# Patient Record
Sex: Male | Born: 1970 | Race: White | Hispanic: No | Smoking: Former smoker
Health system: Southern US, Community
[De-identification: ages and names within clinical notes are randomized; demographics above are authoritative.]

## PROBLEM LIST (undated history)

## (undated) DIAGNOSIS — F4310 Post-traumatic stress disorder, unspecified: Secondary | ICD-10-CM

## (undated) DIAGNOSIS — F329 Major depressive disorder, single episode, unspecified: Secondary | ICD-10-CM

## (undated) DIAGNOSIS — F32A Depression, unspecified: Secondary | ICD-10-CM

## (undated) DIAGNOSIS — F419 Anxiety disorder, unspecified: Secondary | ICD-10-CM

## (undated) DIAGNOSIS — M5020 Other cervical disc displacement, unspecified cervical region: Secondary | ICD-10-CM

## (undated) DIAGNOSIS — M4802 Spinal stenosis, cervical region: Secondary | ICD-10-CM

## (undated) DIAGNOSIS — F319 Bipolar disorder, unspecified: Secondary | ICD-10-CM

## (undated) HISTORY — DX: Anxiety disorder, unspecified: F41.9

## (undated) HISTORY — PX: OTHER SURGICAL HISTORY: SHX169

## (undated) HISTORY — PX: WISDOM TOOTH EXTRACTION: SHX21

## (undated) HISTORY — DX: Major depressive disorder, single episode, unspecified: F32.9

## (undated) HISTORY — DX: Depression, unspecified: F32.A

---

## 2011-05-19 ENCOUNTER — Ambulatory Visit (INDEPENDENT_AMBULATORY_CARE_PROVIDER_SITE_OTHER): Payer: 59 | Admitting: Behavioral Health

## 2011-05-19 DIAGNOSIS — F419 Anxiety disorder, unspecified: Secondary | ICD-10-CM

## 2011-05-19 DIAGNOSIS — F411 Generalized anxiety disorder: Secondary | ICD-10-CM

## 2011-05-23 ENCOUNTER — Encounter (HOSPITAL_COMMUNITY): Payer: Self-pay | Admitting: Psychiatry

## 2011-05-23 ENCOUNTER — Encounter (HOSPITAL_COMMUNITY): Payer: Self-pay | Admitting: Behavioral Health

## 2011-05-23 ENCOUNTER — Ambulatory Visit (INDEPENDENT_AMBULATORY_CARE_PROVIDER_SITE_OTHER): Payer: 59 | Admitting: Psychiatry

## 2011-05-23 VITALS — BP 144/86 | HR 76 | Ht 68.0 in | Wt 192.0 lb

## 2011-05-23 DIAGNOSIS — F411 Generalized anxiety disorder: Secondary | ICD-10-CM

## 2011-05-23 DIAGNOSIS — F419 Anxiety disorder, unspecified: Secondary | ICD-10-CM

## 2011-05-23 MED ORDER — QUETIAPINE FUMARATE 50 MG PO TABS: 50.0000 mg | ORAL_TABLET | Freq: Every day | ORAL | Status: DC
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
## 2011-05-23 NOTE — Patient Instructions (Signed)
turning him today we have discussed the ears symptoms and have agreed upon Seroquel 50 mg trial dose. The risk benefit and alternative treatments have been discussed. Remember if you have any adverse reaction to this medication to call the office and schedule an earlier appointment. We would like you to return in 2 weeks so we can evaluate the effectiveness of medication. I you are going to continue seeing CP for therapy and you were encouraged to explore the behavioral treatments described in the book called overcome coming stress for dummies her management a stress for dummies. You have been given the crisis numbers and these are for emergencies 911, emergency department, or behavioral Health Center in [place] [phone].

## 2011-05-23 NOTE — Progress Notes (Signed)
Psychiatric Assessment Adult  Patient Identification:  Mckale Haffey Date of Evaluation:  05/23/2011 Chief Complaint: I am too anxious, cannot leave my house, very nervous around people, having panic attacks History of Chief Complaint:   Chief Complaint  Patient presents with  . Anxiety  . Medication Problem    HPI Review of Systems Physical Exam  Depressive Symptoms: Depressiosn severe anxiety, social phobia, agoraphobia with panic attacks  (Hypo) Manic Symptoms:   Elevated Mood:  No Irritable Mood:  No Grandiosity:  No Distractibility:  Yes Labiality of Mood:  No Delusions:  No Hallucinations:  No Impulsivity:  No Sexually Inappropriate Behavior:  No Financial Extravagance:  No Flight of Ideas:  Yes  Anxiety Symptoms: Excessive Worry:  Yes Panic Symptoms:  Yes Agoraphobia:  Yes Obsessive Compulsive: Yes  Symptoms: symmetry Specific Phobias:  No Social Anxiety:  Yes  Psychotic Symptoms:  Hallucinations: No None Delusions:  No Paranoia:  No   Ideas of Reference:  No  PTSD Symptoms: Ever had a traumatic exposure:  No Had a traumatic exposure in the last month:  No Re-experiencing: No None Hypervigilance:  Yes Hyperarousal: Yes Emotional Numbness/Detachment Avoidance: Yes Foreshortened Future  Traumatic Brain Injury: No none  Past Psychiatric History: Diagnosis: Agoraphobia with panic D/O, Social Anxiety, Depression  Hospitalizations: 0  Outpatient Care: 0  Substance Abuse Care: 0  Self-Mutilation: 0  Suicidal Attempts: 2  Violent Behaviors: 0   Past Medical History:   Past Medical History  Diagnosis Date  . Anxiety   . Depression    History of Loss of Consciousness:  No Seizure History:  No Cardiac History:  No Allergies:  No Known Allergies Current Medications:  Current Outpatient Prescriptions  Medication Sig Dispense Refill  . clonazePAM (KLONOPIN) 1 MG tablet Take 1 mg by mouth 2 (two) times daily.      . QUEtiapine (SEROQUEL) 50 MG  tablet Take 1 tablet (50 mg total) by mouth at bedtime.  15 tablet  0    Previous Psychotropic Medications:  Medication Dose  Clonazepam   .5 mg 2 0X daily                     Substance Abuse History in the last 12 months: Substance Age of 1st Use Last Use Amount Specific Type  Nicotine  16     Alcohol  *16     Cannabis  16     Opiates  0           Methamphetamines 0     LSD  0     Ecstasy  once     Benzodiazepines  0     Caffeine  0     Inhalants  0     Others:                          Medical Consequences of Substance Abuse: 0  Legal Consequences of Substance Abuse: 0  Family Consequences of Substance Abuse: 0  Blackouts:  Yes DT's:  No Withdrawal Symptoms:  No None  Social History: Current Place of Residence: [place] Place of Birth: [place] Family Members: 3 Marital Status:  Single Children: 0  Sons: 0  Daughters: 0 Relationships:fiance Education:  GED Educational Problems/Performance: good Religious Beliefs/Practices: christian History of Abuse: none Teacher, music History:  Data processing manager History: 0 Hobbies/Interests: 0  Family History:   Family History  Problem Relation Age of Onset  . Cancer Mother   .  Cancer Father   . Depression Father     Mental Status Examination/Evaluation: Objective:  Appearance: Neat  Eye Contact::  Good  Speech:  Normal Rate  Volume:  Normal  Mood:  anxipus  Affect:  Congruent  Thought Process:  Goal Directed  Orientation:  Full  Thought Content:  Paranoid Ideation  Suicidal Thoughts:  No  Homicidal Thoughts:  No  Judgement:  Good  Insight:  Fair  Psychomotor Activity:  Increased  Akathisia:  No  Handed:  Right  AIMS (if indicated):  0  Assets:  Communication Skills Desire for Improvement Financial Resources/Insurance Housing Intimacy Physical Health Social Support Transportation Vocational/Educational    Laboratory/X-Ray Psychological Evaluation(s)   0 0    Assessment:  Pt is very anxiious  Has had problems with SSRIs is willing to try trial dose of SGA  AXIS I Depressive Disorder NOS, Panic Disorder and Social Anxiety  AXIS II Obsessive- Compulsive Personality 0  AXIS III Past Medical History  Diagnosis Date  . Anxiety   . Depression      AXIS IV other psychosocial or environmental problems and problems related to social environment  AXIS V 51-60 moderate symptoms   Treatment Plan/Recommendations:  Plan of Care:  CBT, Medication  Laboratory:  0  Psychotherapy: with C. PETERS  Medications: Klonopin, Seroquel  Routine PRN Medications:  No  Consultations: 0  Safety Concerns:  Suicide risk Minimal  Other:      Mickeal Skinner, MD 2/26/201310:57 AM

## 2011-05-24 ENCOUNTER — Ambulatory Visit (HOSPITAL_COMMUNITY): Payer: 59 | Admitting: Behavioral Health

## 2011-06-06 ENCOUNTER — Ambulatory Visit (HOSPITAL_COMMUNITY): Payer: 59 | Admitting: Behavioral Health

## 2011-06-06 ENCOUNTER — Ambulatory Visit (HOSPITAL_COMMUNITY): Payer: 59 | Admitting: Psychiatry

## 2011-06-09 ENCOUNTER — Ambulatory Visit (INDEPENDENT_AMBULATORY_CARE_PROVIDER_SITE_OTHER): Payer: 59 | Admitting: Behavioral Health

## 2011-06-09 DIAGNOSIS — F411 Generalized anxiety disorder: Secondary | ICD-10-CM

## 2011-06-09 DIAGNOSIS — F419 Anxiety disorder, unspecified: Secondary | ICD-10-CM

## 2011-06-12 ENCOUNTER — Encounter (HOSPITAL_COMMUNITY): Payer: Self-pay | Admitting: Behavioral Health
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
## 2011-06-16 ENCOUNTER — Ambulatory Visit (INDEPENDENT_AMBULATORY_CARE_PROVIDER_SITE_OTHER): Payer: 59 | Admitting: Behavioral Health

## 2011-06-16 DIAGNOSIS — F411 Generalized anxiety disorder: Secondary | ICD-10-CM

## 2011-06-19 ENCOUNTER — Ambulatory Visit (INDEPENDENT_AMBULATORY_CARE_PROVIDER_SITE_OTHER): Payer: 59 | Admitting: Psychiatry

## 2011-06-19 ENCOUNTER — Encounter (HOSPITAL_COMMUNITY): Payer: Self-pay | Admitting: Psychiatry

## 2011-06-19 VITALS — BP 142/92 | HR 90 | Ht 69.5 in | Wt 198.0 lb

## 2011-06-19 DIAGNOSIS — F4001 Agoraphobia with panic disorder: Secondary | ICD-10-CM

## 2011-06-19 DIAGNOSIS — F411 Generalized anxiety disorder: Secondary | ICD-10-CM

## 2011-06-19 MED ORDER — CLONAZEPAM 1 MG PO TABS: ORAL_TABLET | ORAL | Status: DC
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
## 2011-06-21 NOTE — Progress Notes (Signed)
   THERAPIST PROGRESS NOTE  Session Time: 9:00  Participation Level: Active  Behavioral Response: CasualAlertAnxious  Type of Therapy: Individual Therapy  Treatment Goals addressed: Anxiety  Interventions: CBT  Summary: Phillip [name] is a 41 y.o. male who presents with anxiety.   Suicidal/Homicidal: Nowithout intent/plan  Therapist Response: The client entered the session indicating that he had done his homework and in written list of things that he would like to say to his father. He indicated that he would like for his father to know how he was doing, to know his father proved what he was doing and most of all wanted his father to know that he missed him. We processed his list and I ask how difficult it was. He indicated that it was a swollen painful process which he did with the aid of his girlfriend. He indicated that he became very emotional because he had not dealt with grief over his father's death and was somewhat painful but also good to be able to think about a conversation with his father. The client was very structured and his response to the homework going step-by-step and by question and indicating when he felt it was time to stop speaking about it so we did. He did report that he went to his model airplanes flying show in [place] with his girlfriend one week ago. He indicated that he was fine until he got to the air show and again had a panic attack before he could get out of the car. He indicated that his girlfriend spoke to him calmly encouraged him to take another Klonopin which he did and indicated that he was able to attend air show and then do some other shopping and errand type of activities after the air show. He indicated that was his only panic attack in the past week. The client he did his concerns about wanting to be well and not feeling comfortable in his own skin. He indicated that he was appreciative of the medication but that he did not want to continue to take  it for the rest of his life. He used the word normal repeatedly saying that he does not feel normal. I did again asked him if he thought he would benefit from meeting with Dr. Suzette Battiest. He is somewhat hesitant not relation to being Dr. [name] but feeling that a psychiatrist is going to put him on medication above he'll he is a 41 taking any does not want to add any more medication to his routine. I told them I would speak with Dr.Putheval in regards to the medication he started taking and tell the doctor that he has not had much luck with antidepressants. I gave the client some coping skills to use such as breathing which he said he had been using and were helpful. We also talked about journaling and immature he. Also asked him for homework because he indicated that he likes homework to come up with a list specifically as he could asked to situations, events, etc. That he sees as triggers for his anxiety.  Plan: Return again in 1 weeks.  Diagnosis: Axis I: 300.02    Axis II: Deferred    French Ana, [facility] 06/21/2011

## 2011-06-22 ENCOUNTER — Encounter (HOSPITAL_COMMUNITY): Payer: Self-pay | Admitting: Behavioral Health

## 2011-06-22 ENCOUNTER — Ambulatory Visit (INDEPENDENT_AMBULATORY_CARE_PROVIDER_SITE_OTHER): Payer: 59 | Admitting: Behavioral Health

## 2011-06-22 DIAGNOSIS — F411 Generalized anxiety disorder: Secondary | ICD-10-CM

## 2011-06-26 ENCOUNTER — Telehealth (HOSPITAL_COMMUNITY): Payer: Self-pay

## 2011-06-26 ENCOUNTER — Other Ambulatory Visit (HOSPITAL_COMMUNITY): Payer: Self-pay | Admitting: Psychiatry

## 2011-06-26 NOTE — Progress Notes (Signed)
Patient called requesting refill of clonazepam.  At the last encounter, the patient's Clonazepam was increased. He last fill on 06/02/11 for 60 tablet. Given increased dosage will call in new prescription.

## 2011-06-26 NOTE — Progress Notes (Signed)
   THERAPIST PROGRESS NOTE  Session Time: 4:00  Participation Level: Active  Behavioral Response: CasualAlertAnxious  Type of Therapy: Individual Therapy  Treatment Goals addressed: Coping  Interventions: CBT  Summary: Phillip [name] is a 41 y.o. male who presents with anxiety.   Suicidal/Homicidal: Nowithout intent/plan  Therapist Response: The client indicated that it had been a pretty good week but there were certainly some still issues of anxiety but no true panic attacks. He indicated that he had done well in cleaning the house for his and his girlfriend's anniversary as well as taking her shopping for something that she wanted the day after her birthday. We discussed at length his comfort level in particular in open spaces such as malls, restaurants, etc. He indicates an extreme discomfort level and although situations but indicates it's better if he knows the people that are a part of the activity he is taking place in.. The client still feels as if he is in a crowd of people he does not know that they are judging her thinking thinks about him that are positive. The client still has a fairly low self image and cannot look at himself without thinking that he is not attracted. I asked him to describe his parents to me. He described his father as very handsome and his mother as a very pretty lady and indicated that he felt he was a combination of both of her to physically. Her mind him that he thought very highly of his parents physical appearances and that he said he looked like both. He also told me that recently his girlfriend had been very sad and he been very empathetic with her and took him to listen to her when she needed to talk. I from the he was doing the right thing asked him to asked specifically how he could help her. He indicates that she has some self-esteem issues in particular based on her weight. The client reports that he had did have a good meeting with Dr. Hilton Cork and felt  comfortable in Dr. Demetrius Charity. treating him. For homework I asked him to bring in a picture of his parents. Also gave him a handout or a grading scale on how he sees himself. Also asked him to read over some work I gave him on being vulnerable. The client has difficulty with trust. Weight with him again in one week.  Plan: Return again in 1 weeks.  Diagnosis: Axis I: 300.02    Axis II: Deferred    Okey Zelek M, [facility] 06/26/2011

## 2011-06-26 NOTE — Telephone Encounter (Signed)
Called in new prescription for clonazepam 1 mg, take one tablet in the morning, one half tablet in the afternoon, and one tablet at bedtime. #75 no refills.

## 2011-06-26 NOTE — Telephone Encounter (Signed)
Taking 2.5 mg of klonopin now and will run out Wednesday can you please call in new rx

## 2011-06-30 ENCOUNTER — Ambulatory Visit (INDEPENDENT_AMBULATORY_CARE_PROVIDER_SITE_OTHER): Payer: 59 | Admitting: Behavioral Health

## 2011-06-30 DIAGNOSIS — F411 Generalized anxiety disorder: Secondary | ICD-10-CM

## 2011-07-03 ENCOUNTER — Encounter (HOSPITAL_COMMUNITY): Payer: Self-pay | Admitting: Behavioral Health

## 2011-07-03 NOTE — Progress Notes (Signed)
   THERAPIST PROGRESS NOTE  Session Time: 9:00  Participation Level: Active  Behavioral Response: CasualAlertAngry  Type of Therapy: Individual Therapy  Treatment Goals addressed: Anger  Interventions: CBT  Summary: Phillip [name] is a 41 y.o. male who presents with anxiety.   Suicidal/Homicidal: Nowithout intent/plan  Therapist Response: The client entered the session indicating that he was exhausted. He indicated that he was extremely busy at work and typically he could do it busy but he did not understand some of the things are going on at work. He indicated that he is now considering a job search primarily because he cannot continue to working hours and he has been working because his boss has become difficult to work for. He indicates that for the agreement when he started working was that he would not take calls after certain calm at night and he would not work weekends but that she is calling him constantly having to check job sites or to cover for people who have not been her job correctly. He indicates present last week that he was on a job title Monday and Tuesday which pushed him behind of his work. 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 He said that his boss came in for sinus morning and demanded that he had them all done by the end of the day. He indicates that he does estimates extremely quickly and even staying at 6:00 tonight he will get 5 or 6 estimates done. He indicated without arrogance that others are probably only get one or 2 at the most 3 estimates done and he knows that he is doing all he can do. He indicates that he knows the job extremely well and do anything within the company and wonders if his boss is threatened by that. He said that at times she has called them stupid in front of his peers and employees which is very degrading and that one time when he was trying to his work done before going on the field she screamed at him and we  came back she written up reprimand note and left it in his chair. He indicated he became angry and even though he stay calm he told her that he did not deserve that, that he was doing what she asked him to come and demanded that she should read the document or he would leave. He indicated that she did agree to share the document. He said that she would be better for a while and then she would come down him again and he is tired this pattern of behavior. He will client remained relatively calm in the office it was clear that he was angry and feels that he is being treated unjustly. He indicates that he can go to any other company in this field with his skill set and make substantially more money work less hours, and have less headaches. He was supposed to raise 3 months ago he has not seen yet. I encouraged and asked for him to continue to looking for work that is what he wanted to do although it feels that would be the best for him.   Plan: Return again in 2 weeks.  Diagnosis: Axis I: anxiety    Axis II: Deferred    Phillip [name] M, [facility] 07/03/2011

## 2011-07-10 ENCOUNTER — Ambulatory Visit (HOSPITAL_COMMUNITY): Payer: 59 | Admitting: Behavioral Health

## 2011-07-17 ENCOUNTER — Ambulatory Visit (INDEPENDENT_AMBULATORY_CARE_PROVIDER_SITE_OTHER): Payer: 59 | Admitting: Behavioral Health

## 2011-07-17 DIAGNOSIS — F411 Generalized anxiety disorder: Secondary | ICD-10-CM

## 2011-07-18 ENCOUNTER — Encounter (HOSPITAL_COMMUNITY): Payer: Self-pay | Admitting: Behavioral Health

## 2011-07-18 NOTE — Progress Notes (Signed)
THERAPIST PROGRESS NOTE  Session Time: 11:00  Participation Level: Active  Behavioral Response: CasualAlertexhausted  Type of Therapy: Individual Therapy  Treatment Goals addressed: Coping  Interventions: CBT  Summary: Savalas Monje is a 41 y.o. male who presents with anxiety.   Suicidal/Homicidal: Nowithout intent/plan  Therapist Response: The client entered the session indicating that he was exhausted and has been working 12-14 hours per day. because they have been busy and that the owner of the company expects him to do this on a regular basis. He reported that on Sunday of the past weekend he got up at 7:30 or 8 at breakfast going back to sleep and staying asleep until 5:30 in the evening. He indicated that he does not work but that he cannot continue to pace that he is keeping because of the told it is having on his body. He also indicated that he is only spending about an hour a day with his fiancee. He did tell me that they have set a wedding date for September 18 at [facility]. He reported that he did have a conversation with his boss and said some pretty clear boundaries in terms of how she could treat him verbally but that she continues to expect him to work hours. He indicated that he finished a certain job at [employer] and she called him asking him to go look at another job at night. 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 He indicated that he set clear boundaries both times that he could not work beyond that because he was exhausted but she does not CBT that. He did say she had been much her had not been is verbally confrontational. He indicated that he did work on getting his resume with his girlfriend he is going to send that to another company that is worldwide. 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 He indicates that his girlfriend works for a [employer] and she can move just about anywhere that he can. The client looked as if he were to fall asleep 2 or 3 times during session. I told him he could not continue to him busy were working as hours or there would be significant health issues to take place not to mention the damage it could do to his relationship. I praised client for setting clear boundaries in terms of limiting some work. He did indicate that he did she has been much less her verbally and has not been nearly as confrontational but that he does keeps waiting for the" shoe to drop in the old personality of his boss to show up again. The client indicated that he will have difficulty coming in to see me because of the work load and apologize for the session that he missed. I told him I understood and he said 9:00 would be a better time so we will set up for him for the next meeting. I do hope that the client is convinced it is time to make a change in that he cannot continue at this pace. Plan: Return again in 2 weeks.  Diagnosis: Axis I: 300.02    Axis II: Deferred    Elzy Tomasello  Judie Petit, [facility] 07/18/2011

## 2011-07-21 ENCOUNTER — Ambulatory Visit (INDEPENDENT_AMBULATORY_CARE_PROVIDER_SITE_OTHER): Payer: 59 | Admitting: Psychiatry

## 2011-07-21 VITALS — BP 128/87 | HR 78 | Ht 69.5 in | Wt 185.0 lb

## 2011-07-21 DIAGNOSIS — F411 Generalized anxiety disorder: Secondary | ICD-10-CM

## 2011-07-21 MED ORDER — CLONAZEPAM 1 MG PO TABS: ORAL_TABLET | ORAL | Status: DC
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
## 2011-07-23 ENCOUNTER — Encounter (HOSPITAL_COMMUNITY): Payer: Self-pay | Admitting: Psychiatry

## 2011-07-24 ENCOUNTER — Encounter (HOSPITAL_COMMUNITY): Payer: Self-pay | Admitting: Behavioral Health

## 2011-07-24 ENCOUNTER — Ambulatory Visit (INDEPENDENT_AMBULATORY_CARE_PROVIDER_SITE_OTHER): Payer: 59 | Admitting: Behavioral Health

## 2011-07-24 DIAGNOSIS — F411 Generalized anxiety disorder: Secondary | ICD-10-CM
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
## 2011-08-07 ENCOUNTER — Encounter (HOSPITAL_COMMUNITY): Payer: Self-pay | Admitting: Behavioral Health

## 2011-08-07 ENCOUNTER — Ambulatory Visit (INDEPENDENT_AMBULATORY_CARE_PROVIDER_SITE_OTHER): Payer: 59 | Admitting: Behavioral Health

## 2011-08-07 DIAGNOSIS — F411 Generalized anxiety disorder: Secondary | ICD-10-CM
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
## 2011-08-10 ENCOUNTER — Telehealth (HOSPITAL_COMMUNITY): Payer: Self-pay
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
## 2011-09-18 ENCOUNTER — Telehealth (HOSPITAL_COMMUNITY): Payer: Self-pay

## 2011-09-18 NOTE — Telephone Encounter (Addendum)
Patient wife report that patient has changed jobs. Has some agoraphobia. She reports he is no longer able to come in for treatment due to his work schedule.  PLAN:  The patient's wife reports that patient will go to PCP. I have agreed to cover patient for refills for clonazepam 30 days at a time for no longer than 90 days. Will fill Clonazepam 1 mg-Take one tablet in the morning, one-half tablet in the afternoon, and one tablet at bedtime for anxiety.

## 2011-09-18 NOTE — Telephone Encounter (Addendum)
Needs to discuss med mgmt and new job.  Will call Clonazepam

## 2011-09-19 ENCOUNTER — Ambulatory Visit (HOSPITAL_COMMUNITY): Payer: 59 | Admitting: Psychiatry

## 2011-11-13 ENCOUNTER — Other Ambulatory Visit (HOSPITAL_COMMUNITY): Payer: Self-pay | Admitting: Psychiatry

## 2011-11-13 DIAGNOSIS — F411 Generalized anxiety disorder: Secondary | ICD-10-CM

## 2011-11-13 MED ORDER — CLONAZEPAM 1 MG PO TABS: ORAL_TABLET | ORAL | Status: DC

## 2012-07-10 ENCOUNTER — Ambulatory Visit (HOSPITAL_COMMUNITY): Payer: 59 | Admitting: Behavioral Health

## 2012-07-24 ENCOUNTER — Ambulatory Visit (HOSPITAL_COMMUNITY): Payer: 59 | Admitting: Behavioral Health

## 2012-10-28 ENCOUNTER — Encounter (HOSPITAL_COMMUNITY): Payer: Self-pay | Admitting: Behavioral Health

## 2012-10-28 NOTE — Progress Notes (Unsigned)
[name], male   DOB: May 15, 1970, 42 y.o.   MRN: [id number] Outpatient Therapist Discharge Summary  Phillip [name]    Nov 28, 1970   Admission Date:    Discharge Date:  10/28/12 Reason for Discharge:  Client did not return Diagnosis:  Axis I:  Panic disorder with agoraphobia/300.03  Axis II:  deferred  Axis III:  Note noted  Axis IV:    Axis V:    Comments:    French Ana

## 2013-06-04 ENCOUNTER — Emergency Department (HOSPITAL_COMMUNITY)
Admission: EM | Admit: 2013-06-04 | Discharge: 2013-06-04 | Discharge status: Against Medical Advice | Payer: Worker's Compensation | Attending: Emergency Medicine | Admitting: Emergency Medicine

## 2013-06-04 ENCOUNTER — Encounter (HOSPITAL_COMMUNITY): Payer: Self-pay | Admitting: Emergency Medicine

## 2013-06-04 ENCOUNTER — Emergency Department (HOSPITAL_COMMUNITY): Payer: Worker's Compensation

## 2013-06-04 DIAGNOSIS — R079 Chest pain, unspecified: Secondary | ICD-10-CM | POA: Insufficient documentation

## 2013-06-04 DIAGNOSIS — F411 Generalized anxiety disorder: Secondary | ICD-10-CM | POA: Insufficient documentation

## 2013-06-04 DIAGNOSIS — F17200 Nicotine dependence, unspecified, uncomplicated: Secondary | ICD-10-CM | POA: Insufficient documentation

## 2013-06-04 DIAGNOSIS — F329 Major depressive disorder, single episode, unspecified: Secondary | ICD-10-CM | POA: Insufficient documentation

## 2013-06-04 DIAGNOSIS — F3289 Other specified depressive episodes: Secondary | ICD-10-CM | POA: Insufficient documentation

## 2013-06-04 DIAGNOSIS — Z77098 Contact with and (suspected) exposure to other hazardous, chiefly nonmedicinal, chemicals: Secondary | ICD-10-CM | POA: Insufficient documentation

## 2013-06-04 DIAGNOSIS — R000 Tachycardia, unspecified: Secondary | ICD-10-CM | POA: Insufficient documentation

## 2013-06-04 LAB — I-STAT CHEM 8, ED
BUN: 20 mg/dL (ref 6–23)
CALCIUM ION: 1.2 mmol/L (ref 1.12–1.23)
CHLORIDE: 102 meq/L (ref 96–112)
Creatinine, Ser: 1.1 mg/dL (ref 0.50–1.35)
Glucose, Bld: 106 mg/dL — ABNORMAL HIGH (ref 70–99)
HCT: 49 % (ref 39.0–52.0)
Hemoglobin: 16.7 g/dL (ref 13.0–17.0)
Potassium: 4 mEq/L (ref 3.7–5.3)
SODIUM: 141 meq/L (ref 137–147)
TCO2: 25 mmol/L (ref 0–100)

## 2013-06-04 LAB — I-STAT VENOUS BLOOD GAS, ED
Acid-Base Excess: 1 mmol/L (ref 0.0–2.0)
BICARBONATE: 25.6 meq/L — AB (ref 20.0–24.0)
O2 Saturation: 96 %
PCO2 VEN: 41.6 mmHg — AB (ref 45.0–50.0)
TCO2: 27 mmol/L (ref 0–100)
pH, Ven: 7.396 — ABNORMAL HIGH (ref 7.250–7.300)
pO2, Ven: 84 mmHg — ABNORMAL HIGH (ref 30.0–45.0)

## 2013-06-04 MED ORDER — ONDANSETRON HCL 4 MG/2ML IJ SOLN
4.0000 mg | Freq: Once | INTRAMUSCULAR | Status: AC
  Administered 2013-06-04: 4 mg via INTRAVENOUS
  Filled 2013-06-04: qty 2

## 2013-06-04 MED ORDER — MORPHINE SULFATE 4 MG/ML IJ SOLN
4.0000 mg | Freq: Once | INTRAMUSCULAR | Status: AC
Start: 2013-06-04 — End: 2013-06-04
  Administered 2013-06-04: 4 mg via INTRAVENOUS
  Filled 2013-06-04: qty 1

## 2013-06-04 MED ORDER — SODIUM CHLORIDE 0.9 % IV BOLUS (SEPSIS)
500.0000 mL | Freq: Once | INTRAVENOUS | Status: AC
  Administered 2013-06-04: 500 mL via INTRAVENOUS

## 2013-06-04 MED ORDER — SODIUM CHLORIDE 0.9 % IV SOLN: INTRAVENOUS | Status: DC

## 2013-06-04 NOTE — ED Notes (Signed)
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 Presents with cp and SOB, HA and dizziness.

## 2013-06-04 NOTE — ED Notes (Signed)
EDP Phillip [name] notified that pt left AMA.
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
## 2013-06-04 NOTE — ED Notes (Signed)
This RN came to room and pt had pulled out IV and had removed all monitors.  Pt states he is leaving.  Pt advised of risks of leaving AMA.  Pt states he doesn't care and is going to his regular doctor.  Pt refused to sign out AMA.  This RN signed as a witness.  Pt alert and oriented, ambulatory without difficulty, and in NAD.

## 2014-01-25 ENCOUNTER — Emergency Department (HOSPITAL_COMMUNITY)
Admission: EM | Admit: 2014-01-25 | Discharge: 2014-01-26 | Disposition: A | Discharge status: Other Acute Inpatient Hospital | Payer: Federal, State, Local not specified - Other | Attending: Emergency Medicine | Admitting: Emergency Medicine

## 2014-01-25 ENCOUNTER — Encounter (HOSPITAL_COMMUNITY): Payer: Self-pay | Admitting: *Deleted

## 2014-01-25 DIAGNOSIS — F419 Anxiety disorder, unspecified: Secondary | ICD-10-CM | POA: Diagnosis not present

## 2014-01-25 DIAGNOSIS — Z720 Tobacco use: Secondary | ICD-10-CM | POA: Insufficient documentation

## 2014-01-25 DIAGNOSIS — T43212A Poisoning by selective serotonin and norepinephrine reuptake inhibitors, intentional self-harm, initial encounter: Secondary | ICD-10-CM | POA: Insufficient documentation

## 2014-01-25 DIAGNOSIS — T43592A Poisoning by other antipsychotics and neuroleptics, intentional self-harm, initial encounter: Secondary | ICD-10-CM | POA: Insufficient documentation

## 2014-01-25 DIAGNOSIS — T50902A Poisoning by unspecified drugs, medicaments and biological substances, intentional self-harm, initial encounter: Secondary | ICD-10-CM

## 2014-01-25 DIAGNOSIS — T424X2A Poisoning by benzodiazepines, intentional self-harm, initial encounter: Secondary | ICD-10-CM | POA: Diagnosis not present

## 2014-01-25 DIAGNOSIS — F1510 Other stimulant abuse, uncomplicated: Secondary | ICD-10-CM | POA: Insufficient documentation

## 2014-01-25 DIAGNOSIS — R109 Unspecified abdominal pain: Secondary | ICD-10-CM

## 2014-01-25 DIAGNOSIS — F329 Major depressive disorder, single episode, unspecified: Secondary | ICD-10-CM | POA: Insufficient documentation

## 2014-01-25 DIAGNOSIS — Z79899 Other long term (current) drug therapy: Secondary | ICD-10-CM | POA: Insufficient documentation

## 2014-01-25 DIAGNOSIS — R45851 Suicidal ideations: Secondary | ICD-10-CM | POA: Diagnosis present

## 2014-01-25 LAB — ACETAMINOPHEN LEVEL

## 2014-01-25 LAB — CBC WITH DIFFERENTIAL/PLATELET
Basophils Absolute: 0 10*3/uL (ref 0.0–0.1)
Basophils Relative: 0 % (ref 0–1)
Eosinophils Absolute: 0.2 10*3/uL (ref 0.0–0.7)
Eosinophils Relative: 3 % (ref 0–5)
HCT: 44.7 % (ref 39.0–52.0)
HEMOGLOBIN: 15.7 g/dL (ref 13.0–17.0)
LYMPHS PCT: 37 % (ref 12–46)
Lymphs Abs: 2.5 10*3/uL (ref 0.7–4.0)
MCH: 32.9 pg (ref 26.0–34.0)
MCHC: 35.1 g/dL (ref 30.0–36.0)
MCV: 93.7 fL (ref 78.0–100.0)
MONOS PCT: 13 % — AB (ref 3–12)
Monocytes Absolute: 0.9 10*3/uL (ref 0.1–1.0)
NEUTROS ABS: 3.2 10*3/uL (ref 1.7–7.7)
Neutrophils Relative %: 47 % (ref 43–77)
Platelets: 271 10*3/uL (ref 150–400)
RBC: 4.77 MIL/uL (ref 4.22–5.81)
RDW: 12.6 % (ref 11.5–15.5)
WBC: 6.7 10*3/uL (ref 4.0–10.5)

## 2014-01-25 LAB — COMPREHENSIVE METABOLIC PANEL
ALT: 17 U/L (ref 0–53)
ANION GAP: 11 (ref 5–15)
AST: 14 U/L (ref 0–37)
Albumin: 4 g/dL (ref 3.5–5.2)
Alkaline Phosphatase: 91 U/L (ref 39–117)
BILIRUBIN TOTAL: 0.3 mg/dL (ref 0.3–1.2)
BUN: 19 mg/dL (ref 6–23)
CALCIUM: 9.3 mg/dL (ref 8.4–10.5)
CHLORIDE: 103 meq/L (ref 96–112)
CO2: 28 meq/L (ref 19–32)
Creatinine, Ser: 1.03 mg/dL (ref 0.50–1.35)
GFR, EST NON AFRICAN AMERICAN: 88 mL/min — AB (ref >90)
GLUCOSE: 98 mg/dL (ref 70–99)
Potassium: 4 mEq/L (ref 3.7–5.3)
Sodium: 142 mEq/L (ref 137–147)
Total Protein: 7.3 g/dL (ref 6.0–8.3)

## 2014-01-25 LAB — URINALYSIS, ROUTINE W REFLEX MICROSCOPIC
BILIRUBIN URINE: NEGATIVE
GLUCOSE, UA: NEGATIVE mg/dL
HGB URINE DIPSTICK: NEGATIVE
Ketones, ur: NEGATIVE mg/dL
Leukocytes, UA: NEGATIVE
Nitrite: NEGATIVE
PROTEIN: NEGATIVE mg/dL
Specific Gravity, Urine: 1.015 (ref 1.005–1.030)
Urobilinogen, UA: 0.2 mg/dL (ref 0.0–1.0)
pH: 6 (ref 5.0–8.0)

## 2014-01-25 LAB — RAPID URINE DRUG SCREEN, HOSP PERFORMED
AMPHETAMINES: POSITIVE — AB
BARBITURATES: NOT DETECTED
Benzodiazepines: POSITIVE — AB
Cocaine: NOT DETECTED
Opiates: NOT DETECTED
TETRAHYDROCANNABINOL: NOT DETECTED

## 2014-01-25 LAB — ETHANOL: Alcohol, Ethyl (B): <11 mg/dL (ref 0–11)

## 2014-01-25 LAB — SALICYLATE LEVEL

## 2014-01-25 MED ORDER — SODIUM CHLORIDE 0.9 % IV BOLUS (SEPSIS)
1000.0000 mL | INTRAVENOUS | Status: AC
  Administered 2014-01-25: 1000 mL via INTRAVENOUS

## 2014-01-25 NOTE — ED Notes (Signed)
Pt states he ingested meds to attempt suicide. Staffing called for sitter, pt to be wanded by security and placed in paper scrubs.

## 2014-01-25 NOTE — BH Assessment (Signed)
Assessment completed. Consulted Janann Augustori Burkett, NP who recommended inpatient treatment. Informed Dr. Romeo AppleHarrison of the recommendation. [facility] at capacity. TTS will contact other facilities for placement.

## 2014-01-25 NOTE — ED Provider Notes (Signed)
CSN: [id number]     Arrival date & time 01/25/14  2152 History   First MD Initiated Contact with Patient 01/25/14 2209     Chief Complaint  Patient presents with  . Ingestion  . Suicidal     (Consider location/radiation/quality/duration/timing/severity/associated sxs/prior Treatment) Patient is a 43 y.o. male presenting with mental health disorder. The history is provided by the patient.  Mental Health Problem Presenting symptoms: suicidal thoughts   Degree of incapacity (severity):  Moderate Onset quality:  Sudden Timing:  Constant Progression:  Unchanged Chronicity:  New Context: stressful life event   Treatment compliance:  All of the time Relieved by:  Nothing Worsened by:  Nothing tried Ineffective treatments:  None tried Associated symptoms: no abdominal pain, no chest pain and no headaches     Past Medical History  Diagnosis Date  . Anxiety   . Depression    Past Surgical History  Procedure Laterality Date  . Cholesterol     Family History  Problem Relation Age of Onset  . Cancer Mother   . Cancer Father   . Depression Father    History  Substance Use Topics  . Smoking status: Current Every Day Smoker -- 1.00 packs/day for 19 years    Types: Cigarettes  . Smokeless tobacco: Not on file  . Alcohol Use: No    Review of Systems  Constitutional: Negative for fever.  HENT: Negative for drooling and rhinorrhea.   Eyes: Negative for pain.  Respiratory: Negative for cough and shortness of breath.   Cardiovascular: Negative for chest pain and leg swelling.  Gastrointestinal: Negative for nausea, vomiting, abdominal pain and diarrhea.  Genitourinary: Negative for dysuria and hematuria.  Musculoskeletal: Negative for gait problem and neck pain.  Skin: Negative for color change.  Neurological: Negative for numbness and headaches.  Hematological: Negative for adenopathy.  Psychiatric/Behavioral: Positive for suicidal ideas. Negative for behavioral problems.   All other systems reviewed and are negative.     Allergies  Review of patient's allergies indicates no known allergies.  Home Medications   Prior to Admission medications   Medication Sig Start Date End Date Taking? Authorizing Provider  clonazePAM (KLONOPIN) 1 MG tablet Take one tablet in the morning, one-half tablet in the afternoon, and one tablet at bedtime for anxiety. 11/13/11   Jamse MeadMary Patricia Moore, MD   BP 96/61 mmHg  Pulse 63  Temp(Src) 97.5 F (36.4 C) (Oral)  Resp 19  SpO2 99% Physical Exam  Constitutional: He appears well-developed and well-nourished.  HENT:  Head: Normocephalic and atraumatic.  Right Ear: External ear normal.  Left Ear: External ear normal.  Nose: Nose normal.  Mouth/Throat: Oropharynx is clear and moist. No oropharyngeal exudate.  Eyes: Conjunctivae and EOM are normal. Pupils are equal, round, and reactive to light.  Neck: Normal range of motion. Neck supple.  Cardiovascular: Normal rate, regular rhythm, normal heart sounds and intact distal pulses.  Exam reveals no gallop and no friction rub.   No murmur heard. Pulmonary/Chest: Effort normal and breath sounds normal. No respiratory distress. He has no wheezes.  Abdominal: Soft. Bowel sounds are normal. He exhibits no distension. There is no tenderness. There is no rebound and no guarding.  Musculoskeletal: Normal range of motion. He exhibits no edema or tenderness.  Neurological: He is alert.  Drowsy, but easily aroused  Skin: Skin is warm and dry.  Psychiatric:  Suicidal, depressed  Nursing note and vitals reviewed.   ED Course  Procedures (including critical care time) Labs Review  Labs Reviewed  CBC WITH DIFFERENTIAL - Abnormal; Notable for the following:    Monocytes Relative 13 (*)    All other components within normal limits  COMPREHENSIVE METABOLIC PANEL - Abnormal; Notable for the following:    GFR calc non Af Amer 88 (*)    All other components within normal limits   SALICYLATE LEVEL - Abnormal; Notable for the following:    Salicylate Lvl <2.0 (*)    All other components within normal limits  URINE RAPID DRUG SCREEN (HOSP PERFORMED) - Abnormal; Notable for the following:    Benzodiazepines POSITIVE (*)    Amphetamines POSITIVE (*)    All other components within normal limits  ACETAMINOPHEN LEVEL  ETHANOL  URINALYSIS, ROUTINE W REFLEX MICROSCOPIC    Imaging Review No results found.   EKG Interpretation   Date/Time:  "Sunday January 25 2014 22:14:00 EST Ventricular Rate:  64 PR Interval:  181 QRS Duration: 81 QT Interval:  401 QTC Calculation: 414 R Axis:   26 Text Interpretation:  Sinus rhythm ST elev, probable normal early repol  pattern No significant change since last tracing Confirmed by Phoenyx Melka   MD, Cayce Paschal ([id number]) on 01/25/2014 10:52:34 PM     CRITICAL CARE Performed by: Bethanee Redondo, S Total critical care time: 30 min Critical care time was exclusive of separately billable procedures and treating other patients. Critical care was necessary to treat or prevent imminent or life-threatening deterioration. Critical care was time spent personally by me on the following activities: development of treatment plan with patient and/or surrogate as well as nursing, discussions with consultants, evaluation of patient's response to treatment, examination of patient, obtaining history from patient or surrogate, ordering and performing treatments and interventions, ordering and review of laboratory studies, ordering and review of radiographic studies, pulse oximetry and re-evaluation of patient's condition.  MDM   Final diagnoses:  Overdose, intentional self-harm, initial encounter     43"  y.o. male with history of depression who presents with an overdose.he is stating that he took approximately 61 mg Klonopin, 2 tablets of risperdal (unknown mg), 3-5 tablets of trazadone ( unknown mg), 1 tablet of seroquel (unknown mg). He states that he  took all the medications at one time around 9 PM this evening. He denies any pain, nausea, or vomiting. He currently feels sleepy but denies any other symptoms. He is afebrile and vital signs are unremarkable here. Case discussed with poison control. Did not give activated charcoal due to his drowsiness. Poison control recommended monitoring for CNS and respiratory depression. Also potential QTC prolongation and they recommended replacing potassium and magnesium as needed. Also some potential for seizures but unlikely given the ingestion of Klonopin. He states that he is suicidal and ingested the medicine in a suicide attempt. He states that he is having trouble with his fiance. Will fill out IVC paperwork. I had TTS see the patient and they recommended inpatient treatment.  Poison control recommended monitoring for minimum of 6 hours.  IVC paperwork filled out.   12:28 AM Pt can eat after 6-8 hours if he is more alert. I did not put a diet in.   The patient required close monitoring while in the ED d/t ingestion of a large amount of sedative medications and resultant respiratory/CNS depression. He had BP's down to the 70's/50's requiring IVF.   Purvis SheffieldForrest Ahaan Zobrist, MD 01/26/14 1723

## 2014-01-25 NOTE — ED Notes (Addendum)
Poison Control contacted. States to observe for tachycardia, QTC prolongation, and seizures. Did not advice charcoal at this time.

## 2014-01-25 NOTE — ED Notes (Signed)
Security paged to wand pt 

## 2014-01-25 NOTE — ED Notes (Signed)
Pt states he took 60 Klonopin, 2 Risperdal, 3 trazodone, and Seroquel appromately 30 minutes ago. Pt admits to SI, denies HI. Fiance brought pt in and left. Airway intact, pt is sleepy, speech clear, alert and orients x 4, respirations equal and unlabored.

## 2014-01-26 ENCOUNTER — Inpatient Hospital Stay (HOSPITAL_COMMUNITY)
Admission: AD | Admit: 2014-01-26 | Discharge: 2014-02-02 | DRG: 885 | Disposition: A | Discharge status: Home / Self Care | Payer: Federal, State, Local not specified - Other | Attending: Psychiatry | Admitting: Psychiatry

## 2014-01-26 ENCOUNTER — Encounter (HOSPITAL_COMMUNITY): Payer: Self-pay | Admitting: Radiology

## 2014-01-26 ENCOUNTER — Emergency Department (HOSPITAL_COMMUNITY): Payer: Federal, State, Local not specified - Other

## 2014-01-26 DIAGNOSIS — Z62810 Personal history of physical and sexual abuse in childhood: Secondary | ICD-10-CM | POA: Diagnosis present

## 2014-01-26 DIAGNOSIS — F332 Major depressive disorder, recurrent severe without psychotic features: Secondary | ICD-10-CM | POA: Diagnosis present

## 2014-01-26 DIAGNOSIS — F4310 Post-traumatic stress disorder, unspecified: Secondary | ICD-10-CM | POA: Diagnosis present

## 2014-01-26 DIAGNOSIS — Z599 Problem related to housing and economic circumstances, unspecified: Secondary | ICD-10-CM

## 2014-01-26 DIAGNOSIS — F17210 Nicotine dependence, cigarettes, uncomplicated: Secondary | ICD-10-CM | POA: Diagnosis present

## 2014-01-26 DIAGNOSIS — R45851 Suicidal ideations: Secondary | ICD-10-CM | POA: Diagnosis present

## 2014-01-26 DIAGNOSIS — F411 Generalized anxiety disorder: Secondary | ICD-10-CM | POA: Diagnosis present

## 2014-01-26 DIAGNOSIS — Z809 Family history of malignant neoplasm, unspecified: Secondary | ICD-10-CM | POA: Diagnosis not present

## 2014-01-26 DIAGNOSIS — F4001 Agoraphobia with panic disorder: Secondary | ICD-10-CM | POA: Diagnosis present

## 2014-01-26 DIAGNOSIS — G4700 Insomnia, unspecified: Secondary | ICD-10-CM | POA: Diagnosis present

## 2014-01-26 DIAGNOSIS — T424X2A Poisoning by benzodiazepines, intentional self-harm, initial encounter: Secondary | ICD-10-CM | POA: Diagnosis not present

## 2014-01-26 LAB — COMPREHENSIVE METABOLIC PANEL
ALK PHOS: 77 U/L (ref 39–117)
ALT: 15 U/L (ref 0–53)
AST: 12 U/L (ref 0–37)
Albumin: 3.5 g/dL (ref 3.5–5.2)
Anion gap: 13 (ref 5–15)
BILIRUBIN TOTAL: 0.2 mg/dL — AB (ref 0.3–1.2)
BUN: 16 mg/dL (ref 6–23)
CHLORIDE: 103 meq/L (ref 96–112)
CO2: 27 meq/L (ref 19–32)
CREATININE: 1.01 mg/dL (ref 0.50–1.35)
Calcium: 9.1 mg/dL (ref 8.4–10.5)
GFR calc Af Amer: >90 mL/min (ref >90)
GFR calc non Af Amer: >90 mL/min (ref >90)
Glucose, Bld: 125 mg/dL — ABNORMAL HIGH (ref 70–99)
POTASSIUM: 4.2 meq/L (ref 3.7–5.3)
Sodium: 143 mEq/L (ref 137–147)
Total Protein: 6.4 g/dL (ref 6.0–8.3)

## 2014-01-26 LAB — LIPASE, BLOOD: LIPASE: 40 U/L (ref 11–59)

## 2014-01-26 LAB — ACETAMINOPHEN LEVEL: Acetaminophen (Tylenol), Serum: <15 ug/mL (ref 10–30)

## 2014-01-26 MED ORDER — ONDANSETRON HCL 4 MG PO TABS: 4.0000 mg | ORAL_TABLET | Freq: Three times a day (TID) | ORAL | Status: DC | PRN

## 2014-01-26 MED ORDER — IOHEXOL 300 MG/ML  SOLN
100.0000 mL | Freq: Once | INTRAMUSCULAR | Status: AC | PRN
  Administered 2014-01-26: 100 mL via INTRAVENOUS

## 2014-01-26 MED ORDER — GABAPENTIN 400 MG PO CAPS
800.0000 mg | ORAL_CAPSULE | Freq: Three times a day (TID) | ORAL | Status: DC
Start: 2014-01-26 — End: 2014-01-26
  Administered 2014-01-26 (×3): 800 mg via ORAL
  Filled 2014-01-26 (×4): qty 2

## 2014-01-26 MED ORDER — NICOTINE 21 MG/24HR TD PT24
21.0000 mg | MEDICATED_PATCH | Freq: Every day | TRANSDERMAL | Status: DC
  Administered 2014-01-26: 21 mg via TRANSDERMAL
  Filled 2014-01-26: qty 1

## 2014-01-26 MED ORDER — LORAZEPAM 1 MG PO TABS
1.0000 mg | ORAL_TABLET | Freq: Three times a day (TID) | ORAL | Status: DC | PRN
  Administered 2014-01-26 (×2): 1 mg via ORAL
  Filled 2014-01-26 (×2): qty 1

## 2014-01-26 MED ORDER — ACETAMINOPHEN 325 MG PO TABS
650.0000 mg | ORAL_TABLET | ORAL | Status: DC | PRN
  Administered 2014-01-26: 650 mg via ORAL
  Filled 2014-01-26: qty 2

## 2014-01-26 MED ORDER — GABAPENTIN 800 MG PO TABS
800.0000 mg | ORAL_TABLET | Freq: Three times a day (TID) | ORAL | Status: DC
  Filled 2014-01-26 (×3): qty 1

## 2014-01-26 MED ORDER — IOHEXOL 300 MG/ML  SOLN
25.0000 mL | Freq: Once | INTRAMUSCULAR | Status: AC | PRN
  Administered 2014-01-26: 25 mL via ORAL

## 2014-01-26 NOTE — ED Notes (Signed)
Patient requested to be made "xxx".   I contacted registration and they will change per patient request.

## 2014-01-26 NOTE — ED Notes (Signed)
Called BH talked with Tori and she is holding pt bed while he gets an abdominal workup.

## 2014-01-26 NOTE — ED Provider Notes (Signed)
Patient accepted to behavioral health but now complaining of epigastric and upper abdominal pain. Denies any nausea, vomiting or diarrhea. States pain is been ongoing all day. LFTs reviewed and normal last night.  We'll repeat hepatic function and check lipase. Check CT abdomen.  LFTs and lipase normal. CT scan with large amount of stool. Patient informed of multiple lung nodules and need for 1 year follow-up. [facility]  Dr. Elna BreslowEappen accepting.  BP 122/71 mmHg  Pulse 72  Temp(Src) 98.5 F (36.9 C) (Oral)  Resp 20  SpO2 100%   Glynn OctaveStephen Emmalea Treanor, MD 01/27/14 0002
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
## 2014-01-26 NOTE — ED Notes (Signed)
Breakfast tray no sharps ordered for pt.

## 2014-01-26 NOTE — ED Notes (Signed)
Paged [facility] for transport

## 2014-01-26 NOTE — ED Notes (Signed)
Called Ct to let them know pt drank contrast

## 2014-01-26 NOTE — ED Notes (Signed)
Secretary to call [facility] for transport.

## 2014-01-26 NOTE — ED Notes (Signed)
[facility] at bedside to transport patient.  

## 2014-01-26 NOTE — ED Notes (Signed)
Dr. Rhunette CroftNanavati to bedside to discuss with patient about being IVC, is not able to leave. Is requesting home meds. Pharmacy tech to do med report on him and get morning meds. Per Dr. Rubin PayorPickering all he can have at this time is gabapentin. Pt made aware.

## 2014-01-26 NOTE — ED Notes (Signed)
BH called to report [facility]

## 2014-01-26 NOTE — ED Provider Notes (Signed)
  Physical Exam  BP 84/49 mmHg  Pulse 61  Temp(Src) 97.5 F (36.4 C) (Oral)  Resp 13  SpO2 99%  Physical Exam  ED Course  Procedures  MDM  Assuming care of patient this evening Patient in the ED for overdose, and SI. Klonipin- 60 x 1 mg tablets, and risperdal, trazodone. Ingestion at 9 pm Pt needs to be monitored closely, for sedative effects of this meds. Workup thus far otherwise is neg. IVC paperwork completed. Medically cleared at 6 am. Patient had no complains, no concerns from the nursing side. Will continue to monitor.  7:22 AM Pt is easily arousable, asking for breakfast. Medically cleared. Awaiting bed placement.   Derwood KaplanAnkit Irelyn Perfecto, MD 01/26/14 [phone]

## 2014-01-27 ENCOUNTER — Encounter (HOSPITAL_COMMUNITY): Payer: Self-pay

## 2014-01-27 DIAGNOSIS — F4000 Agoraphobia, unspecified: Secondary | ICD-10-CM

## 2014-01-27 DIAGNOSIS — F333 Major depressive disorder, recurrent, severe with psychotic symptoms: Secondary | ICD-10-CM

## 2014-01-27 DIAGNOSIS — F332 Major depressive disorder, recurrent severe without psychotic features: Secondary | ICD-10-CM | POA: Diagnosis present

## 2014-01-27 MED ORDER — OLANZAPINE 5 MG PO TBDP
5.0000 mg | ORAL_TABLET | Freq: Four times a day (QID) | ORAL | Status: DC | PRN
  Administered 2014-01-30: 5 mg via ORAL
  Filled 2014-01-27: qty 1

## 2014-01-27 MED ORDER — QUETIAPINE FUMARATE 100 MG PO TABS
100.0000 mg | ORAL_TABLET | Freq: Every day | ORAL | Status: DC
  Filled 2014-01-27: qty 1

## 2014-01-27 MED ORDER — NICOTINE 21 MG/24HR TD PT24
21.0000 mg | MEDICATED_PATCH | Freq: Every day | TRANSDERMAL | Status: DC
  Administered 2014-01-28 – 2014-02-02 (×6): 21 mg via TRANSDERMAL
  Filled 2014-01-27 (×7): qty 1

## 2014-01-27 MED ORDER — TRAZODONE HCL 150 MG PO TABS
150.0000 mg | ORAL_TABLET | Freq: Every evening | ORAL | Status: DC | PRN
  Administered 2014-01-27 – 2014-01-30 (×3): 150 mg via ORAL
  Filled 2014-01-27 (×3): qty 1

## 2014-01-27 MED ORDER — BENZTROPINE MESYLATE 1 MG PO TABS
ORAL_TABLET | ORAL | Status: AC
  Administered 2014-01-27: 1 mg via ORAL
  Filled 2014-01-27: qty 1

## 2014-01-27 MED ORDER — GABAPENTIN 800 MG PO TABS
800.0000 mg | ORAL_TABLET | Freq: Three times a day (TID) | ORAL | Status: DC
  Administered 2014-01-27 (×2): 800 mg via ORAL
  Filled 2014-01-27 (×5): qty 1

## 2014-01-27 MED ORDER — LORAZEPAM 1 MG PO TABS: 1.0000 mg | ORAL_TABLET | Freq: Four times a day (QID) | ORAL | Status: AC | PRN

## 2014-01-27 MED ORDER — QUETIAPINE FUMARATE ER 200 MG PO TB24
200.0000 mg | ORAL_TABLET | Freq: Every day | ORAL | Status: DC
  Administered 2014-01-27: 200 mg via ORAL
  Filled 2014-01-27 (×2): qty 1

## 2014-01-27 MED ORDER — DIPHENHYDRAMINE HCL 50 MG/ML IJ SOLN
INTRAMUSCULAR | Status: AC
  Filled 2014-01-27: qty 1

## 2014-01-27 MED ORDER — ONDANSETRON 4 MG PO TBDP: 4.0000 mg | ORAL_TABLET | Freq: Four times a day (QID) | ORAL | Status: AC | PRN

## 2014-01-27 MED ORDER — LOPERAMIDE HCL 2 MG PO CAPS: 2.0000 mg | ORAL_CAPSULE | ORAL | Status: AC | PRN

## 2014-01-27 MED ORDER — FLUVOXAMINE MALEATE 50 MG PO TABS
ORAL_TABLET | ORAL | Status: AC
  Filled 2014-01-27: qty 1

## 2014-01-27 MED ORDER — TRAZODONE HCL 100 MG PO TABS
100.0000 mg | ORAL_TABLET | Freq: Every day | ORAL | Status: DC
  Filled 2014-01-27: qty 1

## 2014-01-27 MED ORDER — MAGNESIUM HYDROXIDE 400 MG/5ML PO SUSP: 30.0000 mL | Freq: Every day | ORAL | Status: DC | PRN

## 2014-01-27 MED ORDER — ENSURE COMPLETE PO LIQD
237.0000 mL | Freq: Two times a day (BID) | ORAL | Status: DC
  Administered 2014-01-27 – 2014-02-01 (×8): 237 mL via ORAL

## 2014-01-27 MED ORDER — ADULT MULTIVITAMIN W/MINERALS CH
1.0000 | ORAL_TABLET | Freq: Every day | ORAL | Status: DC
  Administered 2014-01-27 – 2014-02-02 (×6): 1 via ORAL
  Filled 2014-01-27 (×8): qty 1

## 2014-01-27 MED ORDER — THIAMINE HCL 100 MG/ML IJ SOLN: 100.0000 mg | Freq: Once | INTRAMUSCULAR | Status: DC

## 2014-01-27 MED ORDER — LORAZEPAM 1 MG PO TABS
1.0000 mg | ORAL_TABLET | Freq: Four times a day (QID) | ORAL | Status: AC
  Administered 2014-01-27 (×4): 1 mg via ORAL
  Filled 2014-01-27 (×3): qty 1

## 2014-01-27 MED ORDER — FLUVOXAMINE MALEATE 50 MG PO TABS
25.0000 mg | ORAL_TABLET | Freq: Two times a day (BID) | ORAL | Status: DC
  Administered 2014-01-27 – 2014-01-30 (×5): 25 mg via ORAL
  Filled 2014-01-27 (×7): qty 1

## 2014-01-27 MED ORDER — ZIPRASIDONE MESYLATE 20 MG IM SOLR
INTRAMUSCULAR | Status: AC
Start: 2014-01-27 — End: 2014-01-27
  Filled 2014-01-27: qty 20

## 2014-01-27 MED ORDER — VITAMIN B-1 100 MG PO TABS
100.0000 mg | ORAL_TABLET | Freq: Every day | ORAL | Status: DC
  Administered 2014-01-28 – 2014-02-02 (×5): 100 mg via ORAL
  Filled 2014-01-27 (×7): qty 1

## 2014-01-27 MED ORDER — LORAZEPAM 1 MG PO TABS
1.0000 mg | ORAL_TABLET | Freq: Three times a day (TID) | ORAL | Status: AC
  Administered 2014-01-28 (×3): 1 mg via ORAL
  Filled 2014-01-27 (×3): qty 1

## 2014-01-27 MED ORDER — LORAZEPAM 1 MG PO TABS
1.0000 mg | ORAL_TABLET | Freq: Two times a day (BID) | ORAL | Status: AC
  Administered 2014-01-29: 1 mg via ORAL
  Filled 2014-01-27 (×2): qty 1

## 2014-01-27 MED ORDER — DIPHENHYDRAMINE HCL 50 MG/ML IJ SOLN
50.0000 mg | Freq: Once | INTRAMUSCULAR | Status: AC
  Administered 2014-01-27: 50 mg via INTRAMUSCULAR

## 2014-01-27 MED ORDER — HALOPERIDOL 5 MG PO TABS
5.0000 mg | ORAL_TABLET | Freq: Three times a day (TID) | ORAL | Status: DC | PRN
  Administered 2014-01-27: 5 mg via ORAL

## 2014-01-27 MED ORDER — LORAZEPAM 1 MG PO TABS
ORAL_TABLET | ORAL | Status: AC
  Filled 2014-01-27: qty 1

## 2014-01-27 MED ORDER — ACETAMINOPHEN 325 MG PO TABS
650.0000 mg | ORAL_TABLET | Freq: Four times a day (QID) | ORAL | Status: DC | PRN
Start: 2014-01-27 — End: 2014-02-02

## 2014-01-27 MED ORDER — NICOTINE 21 MG/24HR TD PT24
21.0000 mg | MEDICATED_PATCH | Freq: Every day | TRANSDERMAL | Status: DC
  Administered 2014-01-27: 21 mg via TRANSDERMAL
  Filled 2014-01-27 (×2): qty 1

## 2014-01-27 MED ORDER — GABAPENTIN 300 MG PO CAPS
600.0000 mg | ORAL_CAPSULE | Freq: Four times a day (QID) | ORAL | Status: DC
  Administered 2014-01-27 – 2014-02-02 (×19): 600 mg via ORAL
  Filled 2014-01-27 (×28): qty 2

## 2014-01-27 MED ORDER — BENZTROPINE MESYLATE 1 MG PO TABS
1.0000 mg | ORAL_TABLET | Freq: Three times a day (TID) | ORAL | Status: DC | PRN
  Administered 2014-01-27: 1 mg via ORAL

## 2014-01-27 MED ORDER — HALOPERIDOL 5 MG PO TABS
ORAL_TABLET | ORAL | Status: AC
  Administered 2014-01-27: 5 mg via ORAL
  Filled 2014-01-27: qty 1

## 2014-01-27 MED ORDER — FLUVOXAMINE MALEATE 50 MG PO TABS: 25.0000 mg | ORAL_TABLET | Freq: Two times a day (BID) | ORAL | Status: DC

## 2014-01-27 MED ORDER — LORAZEPAM 1 MG PO TABS
1.0000 mg | ORAL_TABLET | Freq: Every day | ORAL | Status: AC
  Administered 2014-01-30: 1 mg via ORAL
  Filled 2014-01-27: qty 1

## 2014-01-27 MED ORDER — ZIPRASIDONE MESYLATE 20 MG IM SOLR
20.0000 mg | Freq: Once | INTRAMUSCULAR | Status: AC
  Administered 2014-01-27: 20 mg via INTRAMUSCULAR

## 2014-01-27 MED ORDER — HYDROXYZINE HCL 25 MG PO TABS
25.0000 mg | ORAL_TABLET | Freq: Four times a day (QID) | ORAL | Status: DC | PRN
  Administered 2014-01-27 – 2014-01-28 (×2): 25 mg via ORAL
  Filled 2014-01-27: qty 1
  Filled 2014-01-27: qty 30
  Filled 2014-01-27: qty 1

## 2014-01-27 MED ORDER — ALUM & MAG HYDROXIDE-SIMETH 200-200-20 MG/5ML PO SUSP: 30.0000 mL | ORAL | Status: DC | PRN

## 2014-01-27 MED ORDER — OLANZAPINE 10 MG IM SOLR: 5.0000 mg | Freq: Four times a day (QID) | INTRAMUSCULAR | Status: DC | PRN
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
## 2014-01-27 NOTE — Progress Notes (Signed)
[name], male   DOB: [birth year], 43 y.o.   MRN: [id number] Admission note: D:Patient is a Involuntary admission in no acute distress for depression anxiety and attempted SA by overdosing on 60-65 mg on klonopin on 01/26/2014. Pt reports issues with his current finance and does not think the relationship will workout. Pt asked repeatedly if he can go home and finish what he started. Pt stated i don't think you guys can help me. Pt had no expression on his face stating "I don't want want to be here anymore". Pt has hx of agoraphobia and is concerned about been in a group setting. Pt able to contract for safety and come to staff if feeling unsafe.  A: Pt admitted to unit per protocol, skin assessment and belonging search done. No skin issues noted. Consent signed by pt. Pt educated on therapeutic milieu rules. Pt was introduced to milieu by nursing staff. Fall risk safety plan explained to the patient. 15 minutes checks started for safety.  R: Pt was receptive to education. Writer offered support.

## 2014-01-27 NOTE — Progress Notes (Signed)
NUTRITION ASSESSMENT  Pt identified as at risk on the Malnutrition Screen Tool  INTERVENTION: 1. Educated patient on the importance of nutrition and encouraged intake of food and beverages. 2. Discussed weight goals. 3. Supplements: Ensure Complete po BID, each supplement provides 350 kcal and 13 grams of protein  NUTRITION DIAGNOSIS: Unintentional weight loss related to sub-optimal intake as evidenced by pt report.   Goal: Pt to meet >/= 90% of their estimated nutrition needs.  Monitor:  PO intake  Assessment:  43 y.o. Caucasian male [facility]. PT reported that things are not going well with his fianc and he took a bunch of pills.  Pt reports poor appetite for months. He states that he will eat once every couple of days.  Pt states he has lost 20 lb. UBW of 175 lb. Per weight history documentation, pt has lost 14 lb since March (8% wt loss x 8 months, this is not significant for the time frame).   Discussed with pt the importance of eating 3 meals a day with snacks, emphasizing protein consumption. Discussed the importance of good nutrition for mental health and aiding in depression and anxiety.  Pt continues to receive Ensure supplements, states he is drinking them.   Height: Ht Readings from Last 1 Encounters:  01/27/14 5' 10.87" (1.8 m)    Weight: Wt Readings from Last 1 Encounters:  01/27/14 166 lb (75.297 kg)    Weight Hx: Wt Readings from Last 10 Encounters:  01/27/14 166 lb (75.297 kg)  06/04/13 180 lb (81.647 kg)  07/21/11 185 lb (83.915 kg)  06/19/11 198 lb (89.812 kg)  05/23/11 192 lb (87.091 kg)    BMI:  Body mass index is 23.24 kg/(m^2). Pt meets criteria for normal range based on current BMI.  Estimated Nutritional Needs: Kcal: 25-30 kcal/kg Protein: > 1 gram protein/kg Fluid: 1 ml/kcal  Diet Order: Diet regular Pt is also offered choice of unit snacks mid-morning and mid-afternoon.  Pt is eating as desired.    Lab results and medications reviewed.   Phillip FrancoLindsey Anays Detore, MS, RD, LDN Pager: [phone] After Hours Pager: [phone]

## 2014-01-27 NOTE — Progress Notes (Signed)
Patient has slept this afternoon without difficulty. Awoke around 1700 when RD visited. Patient's VS remain stable and his withdrawal symptoms are decreasing. CIWA presently at "5." Medicated per orders and support given. Patient did elect to go down to cafeteria with peers. When asked about AH patient stated, "I'm not sure. I just woke up." Patient visibly less agitated, calmer. Safe with level III obs and contracts for safety. Lawrence MarseillesFriedman, Rowin Bayron Eakes

## 2014-01-27 NOTE — Tx Team (Signed)
Initial Interdisciplinary Treatment Plan   PATIENT STRESSORS: Marital or family conflict   PROBLEM LIST: Problem List/Patient Goals Date to be addressed Date deferred Reason deferred Estimated date of resolution  Suicidal attempt 01/27/2014     depression 01/27/2014     Panic disorder 01/27/2014     Generalized anxiety dx 01/27/2014                                    DISCHARGE CRITERIA:  Adequate post-discharge living arrangements Improved stabilization in mood, thinking, and/or behavior Motivation to continue treatment in a less acute level of care Safe-care adequate arrangements made  PRELIMINARY DISCHARGE PLAN: Outpatient therapy Participate in family therapy Return to previous living arrangement  PATIENT/FAMIILY INVOLVEMENT: This treatment plan has been presented to and reviewed with the patient, Phillip [name], and/or family member,  The patient and family have been given the opportunity to ask questions and make suggestions.  JEHU-APPIAH, Leslee Haueter K 01/27/2014, 3:47 AM

## 2014-01-27 NOTE — [facility] Group Notes (Signed)
[facility] LCSW Group Therapy  01/27/2014 , 12:44 PM   Type of Therapy:  Invited.  Did not attend  Summary of Progress/Problems: Today's group focused on the term Diagnosis.  Participants were asked to define the term, and then pronounce whether it is a negative, positive or neutral term.  Daryel Geraldorth, Aanvi Voyles B 01/27/2014 , 12:44 PM

## 2014-01-27 NOTE — Plan of Care (Signed)
Problem: Alteration in mood; excessive anxiety as evidenced by: Goal: STG-Pt can identify coping skills to manage panic/anxiety (Patient can identify at least ____ coping skills to manage panic/anxiety attack)  Outcome: Not Progressing Patient unable to list or employ coping skills. Patient hopeless.  Problem: Diagnosis: Increased Risk For Suicide Attempt Goal: STG-Patient Will Attend All Groups On The Unit Outcome: Not Progressing Patient unwilling to leave room. States he is too anxious and does not like being around people.

## 2014-01-27 NOTE — Tx Team (Signed)
  Interdisciplinary Treatment Plan Update   Date Reviewed:  01/27/2014  Time Reviewed:  8:21 AM  Progress in Treatment:   Attending groups: No Participating in groups: No Taking medication as prescribed: Yes  Tolerating medication: Yes Family/Significant other contact made: No Patient understands diagnosis: Yes AEB identifying that he has a wish to die Discussing patient identified problems/goals with staff: Yes  See initial care plan Medical problems stabilized or resolved: Yes Denies suicidal/homicidal ideation: Yes  In tx team Patient has not harmed self or others: Yes  For review of initial/current patient goals, please see plan of care.  Estimated Length of Stay:  4-5 days  Reason for Continuation of Hospitalization: Anxiety Depression Medication stabilization Suicidal ideation Other; describe PTSD symptoms  New Problems/Goals identified:  N/A  Discharge Plan or Barriers:   return home, follow up outpt  Additional Comments:  Trey SailorsSteven Ballengee is an 43 y.o. male [facility]. PT reported that things are not going well with his fianc and he took a bunch of pills. Pt stated "I made a mistake, I did not take enough". Pt also stated "things are better now that I am away from her". Pt attempted suicide earlier today by overdose. Pt reported that he has attempted suicide multiple times in the past by overdose. Pt reported that [facility] due to suicidal thoughts. Pt did not report any currently mental health therapy but reported that he is prescribed medication through his PCP. Pt reported that he is compliant with his medication. Pt shared that he has a history of self-injurious behaviors but denied any recent cuts. Pt is endorsing multiple depressive symptoms ad shared that he has not had an appetite in 2  days. Pt did not report any HI but reported that he is experiencing auditory hallucinations with command. Pt  stated "my subconscious is telling me to kill myself".  Attendees:  Signature: Ivin BootySarama Eappen, MD 01/27/2014 8:21 AM   Signature: Richelle Itood Jeraline Marcinek, LCSW 01/27/2014 8:21 AM  Signature: Fransisca KaufmannLaura Davis, NP 01/27/2014 8:21 AM  Signature: Joslyn Devonaroline Beaudry, RN 01/27/2014 8:21 AM  Signature: Liborio NixonPatrice White, RN 01/27/2014 8:21 AM  Signature:  01/27/2014 8:21 AM  Signature:   01/27/2014 8:21 AM  Signature:    Signature:    Signature:    Signature:    Signature:    Signature:      Scribe for Treatment Team:   [employer] Patrick Sohm, LCSW  01/27/2014 8:21 AM

## 2014-01-27 NOTE — [facility] Suicide Risk Assessment (Signed)
   Nursing information obtained from:    Demographic factors:    Current Mental Status:    Loss Factors:    Historical Factors:    Risk Reduction Factors:    Total Time spent with patient: 30 minutes  CLINICAL FACTORS:   Severe Anxiety and/or Agitation Panic Attacks Depression:   Anhedonia Delusional Hopelessness Impulsivity Insomnia More than one psychiatric diagnosis Currently Psychotic  Psychiatric Specialty Exam: Physical Exam Please see H&P.   ROS  Blood pressure 131/87, pulse 62, temperature 98.6 F (37 C), temperature source Oral, resp. rate 20, height 5' 10.87" (1.8 m), weight 75.297 kg (166 lb).Body mass index is 23.24 kg/(m^2).   Please see H&P.   SUICIDE RISK:   Severe:  Frequent, intense, and enduring suicidal ideation, specific plan, no subjective intent, but some objective markers of intent (i.e., choice of lethal method), the method is accessible, some limited preparatory behavior, evidence of impaired self-control, severe dysphoria/symptomatology, multiple risk factors present, and few if any protective factors, particularly a lack of social support.  PLAN OF CARE:Please see H&P.   I certify that inpatient services furnished can reasonably be expected to improve the patient's condition.  Jafari Mckillop MD 01/27/2014, 12:38 PM
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
## 2014-01-27 NOTE — Progress Notes (Signed)
The focus of this group is to help patients review their daily goal of treatment and discuss progress on daily workbooks. Pt did not attend. 

## 2014-01-28 MED ORDER — QUETIAPINE FUMARATE ER 300 MG PO TB24
300.0000 mg | ORAL_TABLET | Freq: Every day | ORAL | Status: DC
  Administered 2014-01-28 – 2014-01-29 (×2): 300 mg via ORAL
  Filled 2014-01-28 (×4): qty 1
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
## 2014-01-28 NOTE — [facility] Group Notes (Signed)
[facility] LCSW Group Therapy  01/28/2014 1:27 PM  Type of Therapy:  Group Therapy  Participation Level:  Did Not Attend   [name],Phillip 01/28/2014, 1:27 PM

## 2014-01-28 NOTE — [facility] Group Notes (Signed)
[facility] Note   01/28/2014 10:35 AM  Participation Quality:  Sleeping  Did not attend    [name], [name]

## 2014-01-28 NOTE — Progress Notes (Signed)
[name], male   DOB: 1970-06-18, 43 y.o.   MRN: [id number]   D: Pt continues to focus on his medications, asking the writer to discuss meds that were given earlier in the day. Writer discussed meds with pt past, present and future. Pt informed the writer that he was somewhat worried because he was informed that he could stay at [facility] no longer than 5 days. Writer informed pt to take one day at a time. Informed that CM and dr would work together as a team to find the proper placement.  A:  Support and encouragement was offered. 15 min checks continued for safety.  R: Pt remains safe.

## 2014-01-28 NOTE — Progress Notes (Signed)
Patient ID: [name], male   DOB: Dec 06, 1970, 43 y.o.   MRN: [id number]   D: Pt was laying in his bed with his arm over his eyes. Pt refused his scheduled neurontin saying, "I don't think it's gonna help. None of my medicines are working like the others". When asked what "others" he's referring to pt stated, "my klonopin". Writer reminded pt that he's receiving ativan which is also a benzo. Writer also informed pt that the neurontin that he was refusing is also used for anxiety. Pt still refused.  A:  Support and encouragement was offered. 15 min checks continued for safety.  R: Pt remains safe.

## 2014-01-28 NOTE — Progress Notes (Signed)
Did not attend group 

## 2014-01-28 NOTE — Progress Notes (Signed)
[name], male   DOB: 1970/12/12, 43 y.o.   MRN: [id number]  D: Pt. Denies HI and Visual Hallucinations. Patient reports SI and has a plan after discharge however refuses to tell writer what the plan is. Patient contracts for safety while in the hospital. Patient reports command auditory hallucinations. Patient does not report any pain or discomfort at this time.   A: Support and encouragement provided to the patient. Scheduled medications administered to patient per physician's orders.  R: Patient is minimal and isolative. Patient is only seen in the milieu when going to lunch or receiving medications. Patient is unwilling to participate at this time. Q15 minute checks are maintained for safety.

## 2014-01-29 DIAGNOSIS — R45851 Suicidal ideations: Secondary | ICD-10-CM

## 2014-01-29 DIAGNOSIS — F4310 Post-traumatic stress disorder, unspecified: Secondary | ICD-10-CM | POA: Diagnosis present

## 2014-01-29 DIAGNOSIS — F410 Panic disorder [episodic paroxysmal anxiety] without agoraphobia: Secondary | ICD-10-CM

## 2014-01-29 DIAGNOSIS — F411 Generalized anxiety disorder: Secondary | ICD-10-CM

## 2014-01-29 LAB — HEMOGLOBIN A1C
Hgb A1c MFr Bld: 5.6 % (ref <5.7)
MEAN PLASMA GLUCOSE: 114 mg/dL (ref <117)

## 2014-01-29 LAB — LIPID PANEL
CHOLESTEROL: 228 mg/dL — AB (ref 0–200)
HDL: 38 mg/dL — ABNORMAL LOW (ref >39)
LDL CALC: 132 mg/dL — AB (ref 0–99)
Total CHOL/HDL Ratio: 6 RATIO
Triglycerides: 290 mg/dL — ABNORMAL HIGH (ref <150)
VLDL: 58 mg/dL — AB (ref 0–40)

## 2014-01-29 LAB — TSH: TSH: 3.54 u[IU]/mL (ref 0.350–4.500)

## 2014-01-29 NOTE — Progress Notes (Signed)
Did not attended group 

## 2014-01-29 NOTE — Progress Notes (Signed)
[name], male   DOB: 20-Jun-1970, 43 y.o.   MRN: [id number]  [facility]  (Nursing/MHT/Case Management/Adjunct)  Date:  01/29/2014  Time:  9:35 AM  Type of Therapy:  Nurse Education  Participation Level:  Did Not Attend  Participation Quality:  Did not attend  Affect:  Did not attend  Cognitive:  Did not attend  Insight:  None  Engagement in Group:  None  Modes of Intervention:  Discussion and Education  Summary of Progress/Problems: Patient was invited to group but did not attend.

## 2014-01-29 NOTE — Plan of Care (Signed)
Problem: Diagnosis: Increased Risk For Suicide Attempt Goal: STG-Patient Will Comply With Medication Regime Outcome: Not Progressing Patient refuses to take medications today.

## 2014-01-29 NOTE — [facility] Group Notes (Signed)
[facility] LCSW Group Therapy  01/29/2014 2:39 PM   Type of Therapy:  Group Therapy  Participation Level: Invited  Chose to not attend  Summary of Progress/Problems: Today's group focused on relapse prevention.  We defined the term, and then brainstormed on ways to prevent relapse.  Phillip [name], Phillip [name] 01/29/2014 , 2:39 PM

## 2014-01-29 NOTE — Progress Notes (Signed)
[name], male   DOB: 04/28/1970, 43 y.o.   MRN: [id number]  D: Pt. Denies SI/HI and A/V Hallucinations to this Clinical research associatewriter but hesitates before answering these questions. Patient is able to contract for safety while on the unit. Patient does not report any pain or discomfort at this time.  A: Support and encouragement provided to the patient to come to group, take medication and comply with treatment plan. Patient encouraged to not isolate and be in milieu today. Patient refused medications this morning, treatment team was notified. R: Patient is isolative and unwilling to participate at this time. Patient reports he does not like to be around others however patient will go to cafeteria with all other patients. Q15 minute checks are maintained for safety.

## 2014-01-29 NOTE — Tx Team (Signed)
  Interdisciplinary Treatment Plan Update   Date Reviewed:  01/29/2014  Time Reviewed:  10:51 AM  Progress in Treatment:   Attending groups: No Participating in groups: No Taking medication as prescribed: No  Refused AM meds Tolerating medication: Yes Family/Significant other contact made: No  Would not sign release Patient understands diagnosis: Yes  Discussing patient identified problems/goals with staff: Yes  See initial care plan Medical problems stabilized or resolved: Yes Denies suicidal/homicidal ideation: No  But contracts for safety Patient has not harmed self or others: Yes  For review of initial/current patient goals, please see plan of care.  Estimated Length of Stay:  4-5 days  Reason for Continuation of Hospitalization: Anxiety Depression Medication stabilization Suicidal ideation  New Problems/Goals identified:  N/A  Discharge Plan or Barriers:   return home, follow up outpt  Additional Comments:  Pt today appears to be more calm ,however reports medications as not working. Continues to endorse SI with no plan ,contracts for safety. Continues to endorse command AH asking him to "die".  He is not attending groups, and isolates in his room during the day.  Today he refused his AM meds.  Attendees:  Signature: Mardella LaymanFernendo Cobos, MD 01/29/2014 10:51 AM   Signature: Richelle Itood Leticia Mcdiarmid, LCSW 01/29/2014 10:51 AM  Signature:  01/29/2014 10:51 AM  Signature: Marzetta Boardhrista Dopson, RN  01/29/2014 10:51 AM  Signature: Liborio NixonPatrice White, RN 01/29/2014 10:51 AM  Signature:  01/29/2014 10:51 AM  Signature:   01/29/2014 10:51 AM  Signature:    Signature:    Signature:    Signature:    Signature:    Signature:      Scribe for Treatment Team:   Richelle Itood Arion Morgan, LCSW  01/29/2014 10:51 AM

## 2014-01-29 NOTE — Progress Notes (Signed)
[facility]  01/29/2014 6:35 PM Phillip LatheSteven [name]  MRN:  [id number] Subjective:  Phillip [name] is in bed. States he does not think he can make it out of the room. States the main trigger was conflict with the fiancee. States the relationship is over and she knows it. States being away has help some, but the anxiety is still incapacitating. States he thought they had found a good medication in Klonopin but it had not been as effective and he OD on it. States he has had numerous trials with medications but none of them have been effective Diagnosis:   DSM5: Depressive Disorders:  Major Depressive Disorder - Severe (296.23) Total Time spent with patient: 30 minutes  Axis I: Generalized Anxiety Disorder, Panic Disorder and Post Traumatic Stress Disorder  ADL's:  Intact  Sleep: Fair  Appetite:  Poor  Psychiatric Specialty Exam: Physical Exam  Review of Systems  Constitutional: Positive for malaise/fatigue.  HENT: Negative.   Eyes: Negative.   Respiratory: Negative.   Cardiovascular: Negative.   Gastrointestinal: Negative.   Genitourinary: Negative.   Musculoskeletal: Negative.   Skin: Negative.   Neurological: Positive for weakness.  Endo/Heme/Allergies: Negative.   Psychiatric/Behavioral: Positive for depression and suicidal ideas. The patient is nervous/anxious.     Blood pressure 125/77, pulse 79, temperature 98 F (36.7 C), temperature source Oral, resp. rate 20, height 5' 10.87" (1.8 m), weight 75.297 kg (166 lb).Body mass index is 23.24 kg/(m^2).  General Appearance: Fairly Groomed  Patent attorneyye Contact::  Minimal  Speech:  Clear and Coherent, Slow and not spontaneous  Volume:  Decreased  Mood:  Depressed  Affect:  Depressed and Restricted  Thought Process:  Coherent and Goal Directed  Orientation:  Full (Time, Place, and Person)  Thought Content:  no spontaneous content, answers what he is asked for  Suicidal Thoughts:  Yes.  without intent/plan  Homicidal Thoughts:  No   Memory:  Immediate;   Fair Recent;   Fair Remote;   Fair  Judgement:  Fair  Insight:  Present  Psychomotor Activity:  Decreased  Concentration:  Fair  Recall:  FiservFair  Fund of Knowledge:NA  Language: Fair  Akathisia:  No  Handed:    AIMS (if indicated):     Assets:  Desire for Improvement  Sleep:  Number of Hours: 6.75   Musculoskeletal: Strength & Muscle Tone: within normal limits Gait & Station: normal Patient leans: N/A  Current Medications: Current Facility-Administered Medications  Medication Dose Route Frequency Provider Last Rate Last Dose  . acetaminophen (TYLENOL) tablet 650 mg  650 mg Oral Q6H PRN Kerry HoughSpencer E Simon, PA-C      . alum & mag hydroxide-simeth (MAALOX/MYLANTA) 200-200-20 MG/5ML suspension 30 mL  30 mL Oral Q4H PRN Kerry HoughSpencer E Simon, PA-C      . feeding supplement (ENSURE COMPLETE) (ENSURE COMPLETE) liquid 237 mL  237 mL Oral BID BM Tilda FrancoLindsey Baker, RD   237 mL at 01/28/14 1400  . fluvoxaMINE (LUVOX) tablet 25 mg  25 mg Oral BID Jomarie LongsSaramma Eappen, MD   25 mg at 01/29/14 1703  . gabapentin (NEURONTIN) capsule 600 mg  600 mg Oral QID Jomarie LongsSaramma Eappen, MD   600 mg at 01/29/14 1703  . hydrOXYzine (ATARAX/VISTARIL) tablet 25 mg  25 mg Oral Q6H PRN Kerry HoughSpencer E Simon, PA-C   25 mg at 01/28/14 0636  . loperamide (IMODIUM) capsule 2-4 mg  2-4 mg Oral PRN Jomarie LongsSaramma Eappen, MD      . LORazepam (ATIVAN) tablet 1 mg  1 mg  Oral Q6H PRN Jomarie Longs, MD      . LORazepam (ATIVAN) tablet 1 mg  1 mg Oral BID Jomarie Longs, MD   1 mg at 01/29/14 1703   Followed by  . [START ON 01/30/2014] LORazepam (ATIVAN) tablet 1 mg  1 mg Oral Daily Saramma Eappen, MD      . magnesium hydroxide (MILK OF MAGNESIA) suspension 30 mL  30 mL Oral Daily PRN Kerry Hough, PA-C      . multivitamin with minerals tablet 1 tablet  1 tablet Oral Daily Jomarie Longs, MD   1 tablet at 01/28/14 0834  . nicotine (NICODERM CQ - dosed in mg/24 hours) patch 21 mg  21 mg Transdermal Daily Jomarie Longs, MD   21 mg at  01/29/14 0618  . OLANZapine (ZYPREXA) injection 5 mg  5 mg Intramuscular Q6H PRN Saramma Eappen, MD      . OLANZapine zydis (ZYPREXA) disintegrating tablet 5 mg  5 mg Oral Q6H PRN Saramma Eappen, MD      . ondansetron (ZOFRAN-ODT) disintegrating tablet 4 mg  4 mg Oral Q6H PRN Saramma Eappen, MD      . QUEtiapine (SEROQUEL XR) 24 hr tablet 300 mg  300 mg Oral QHS Jomarie Longs, MD   300 mg at 01/28/14 2217  . thiamine (B-1) injection 100 mg  100 mg Intramuscular Once Jomarie Longs, MD   100 mg at 01/27/14 1013  . thiamine (VITAMIN B-1) tablet 100 mg  100 mg Oral Daily Jomarie Longs, MD   100 mg at 01/28/14 0835  . traZODone (DESYREL) tablet 150 mg  150 mg Oral QHS PRN Jomarie Longs, MD   150 mg at 01/27/14 2155    Lab Results:  Results for orders placed or performed during the hospital encounter of 01/26/14 (from the past 48 hour(s))  TSH     Status: None   Collection Time: 01/29/14  6:28 AM  Result Value Ref Range   TSH 3.540 0.350 - 4.500 uIU/mL    Comment: Performed at [facility]  Lipid panel     Status: Abnormal   Collection Time: 01/29/14  6:28 AM  Result Value Ref Range   Cholesterol 228 (H) 0 - 200 mg/dL   Triglycerides 469 (H) <150 mg/dL   HDL 38 (L) >62 mg/dL   Total CHOL/HDL Ratio 6.0 RATIO   VLDL 58 (H) 0 - 40 mg/dL   LDL Cholesterol 952 (H) 0 - 99 mg/dL    Comment:        Total Cholesterol/HDL:CHD Risk Coronary Heart Disease Risk Table                     Men   Women  1/2 Average Risk   3.4   3.3  Average Risk       5.0   4.4  2 X Average Risk   9.6   7.1  3 X Average Risk  23.4   11.0        Use the calculated Patient Ratio above and the CHD Risk Table to determine the patient's CHD Risk.        ATP III CLASSIFICATION (LDL):  <100     mg/dL   Optimal  841-324  mg/dL   Near or Above                    Optimal  130-159  mg/dL   Borderline  401-027  mg/dL   High  >253  mg/dL   Very High [facility]   Hemoglobin A1c      Status: None   Collection Time: 01/29/14  6:28 AM  Result Value Ref Range   Hgb A1c MFr Bld 5.6 <5.7 %    Comment: (NOTE)                                                                       According to the ADA Clinical Practice Recommendations for 2011, when HbA1c is used as a screening test:  >=6.5%   Diagnostic of Diabetes Mellitus           (if abnormal result is confirmed) 5.7-6.4%   Increased risk of developing Diabetes Mellitus References:Diagnosis and Classification of Diabetes Mellitus,Diabetes Care,2011,34(Suppl 1):S62-S69 and Standards of Medical Care in         Diabetes - 2011,Diabetes Care,2011,34 (Suppl 1):S11-S61.    Mean Plasma Glucose 114 <117 mg/dL    Comment: Performed at [facility]    Physical Findings: AIMS: Facial and Oral Movements Muscles of Facial Expression: None, normal Lips and Perioral Area: None, normal Jaw: None, normal Tongue: None, normal,Extremity Movements Upper (arms, wrists, hands, fingers): None, normal Lower (legs, knees, ankles, toes): None, normal, Trunk Movements Neck, shoulders, hips: None, normal, Overall Severity Severity of abnormal movements (highest score from questions above): None, normal Incapacitation due to abnormal movements: None, normal Patient's awareness of abnormal movements (rate only patient's report): No Awareness,    CIWA:  CIWA-Ar Total: 3 COWS:     Treatment Plan Summary: Daily contact with patient to assess and evaluate symptoms and progress in treatment Medication management  Plan: Supportive approach/coping skills/CBT/mindfulness           Reassess/optimize response to the psychotropics                         Medical Decision Making Problem Points:  Established problem, worsening (2) and Review of psycho-social stressors (1) Data Points:  Review of medication regiment & side effects (2)  I certify that inpatient services furnished can reasonably be expected to improve the patient's  condition.   Kennesha Brewbaker A 01/29/2014, 6:35 PM

## 2014-01-30 DIAGNOSIS — F332 Major depressive disorder, recurrent severe without psychotic features: Secondary | ICD-10-CM | POA: Insufficient documentation

## 2014-01-30 MED ORDER — QUETIAPINE FUMARATE 300 MG PO TABS
300.0000 mg | ORAL_TABLET | Freq: Every day | ORAL | Status: DC
  Administered 2014-01-30: 300 mg via ORAL
  Filled 2014-01-30 (×4): qty 1

## 2014-01-30 MED ORDER — FLUVOXAMINE MALEATE 50 MG PO TABS
50.0000 mg | ORAL_TABLET | Freq: Two times a day (BID) | ORAL | Status: DC
  Administered 2014-01-30 – 2014-02-02 (×6): 50 mg via ORAL
  Filled 2014-01-30 (×8): qty 1

## 2014-01-30 MED ORDER — QUETIAPINE FUMARATE 300 MG PO TABS: 300.0000 mg | ORAL_TABLET | Freq: Every day | ORAL | Status: DC

## 2014-01-30 MED ORDER — LORAZEPAM 1 MG PO TABS: 1.0000 mg | ORAL_TABLET | Freq: Four times a day (QID) | ORAL | Status: DC | PRN

## 2014-01-30 NOTE — [facility] Group Notes (Signed)
[facility]   01/30/2014 9:59 AM  Participation Quality:  Did not attend. Sleeping.  Hyatt,Candace

## 2014-01-30 NOTE — Progress Notes (Signed)
[name], male   DOB: 1971/01/10, 43 y.o.   MRN: [id number] D. Patient presents with depressed mood, affect congruent. [name] has been isolative to his room throughout shift thus far. He continues to appear very blunted and depressed. He did report continued anxiety, rating both his depression and anxiety at 10/10 on depression and anxiety scale. He states ''my anxiety is the worst it's ever been. '' He also endorses poor sleep. A. Medications given as ordered, including prn medications for anxiety. Discussed patient progress with treatment team.R. Patient is safe. In no acute distress at this time. Will continue to monitor q 15 minutes for safety.

## 2014-01-30 NOTE — [facility] Group Notes (Signed)
[facility] LCSW Group Therapy  01/30/2014 1:16 PM  Type of Therapy:  Group Therapy  Participation Level: Invited. Invited.  Did Not Attend   Hyatt,Candace 01/30/2014, 1:16 PM

## 2014-01-30 NOTE — Progress Notes (Signed)
[facility]  01/30/2014 5:02 PM Phillip [name]  MRN:  [id number] Subjective:  Continues to endorse feeling depressed, hopeless, with suicidal ideas. States that [place] he felt better for a little while and then the situation with his girlfriend got really bad and he started getting more depressed. The anxiety got to be incapacitating. He states he does not know what is going to take to get him to feel better. Continues to isolate Diagnosis:   DSM5: Depressive Disorders:  Major Depressive Disorder - Severe (296.23) Total Time spent with patient: 30 minutes  Axis I: Generalized Anxiety Disorder and Panic Disorder PTSD  ADL's:  Intact  Sleep: Poor  Appetite:  Poor  Psychiatric Specialty Exam: Physical Exam  Review of Systems  Constitutional: Positive for malaise/fatigue.  HENT: Negative.   Eyes: Negative.   Respiratory: Negative.   Cardiovascular: Negative.   Gastrointestinal: Negative.   Genitourinary: Negative.   Musculoskeletal: Negative.   Skin: Negative.   Neurological: Positive for weakness.  Endo/Heme/Allergies: Negative.   Psychiatric/Behavioral: Positive for depression and suicidal ideas. The patient is nervous/anxious.     Blood pressure 120/77, pulse 81, temperature 98.1 F (36.7 C), temperature source Oral, resp. rate 18, height 5' 10.87" (1.8 m), weight 75.297 kg (166 lb).Body mass index is 23.24 kg/(m^2).  General Appearance: Fairly Groomed  Patent attorneyye Contact::  Fair  Speech:  Clear and Coherent, Slow and not spontaneous  Volume:  Decreased  Mood:  Depressed  Affect:  Depressed  Thought Process:  Coherent and Goal Directed  Orientation:  Full (Time, Place, and Person)  Thought Content:  symptoms worries concerns a sense of hopelessness helplessness  Suicidal Thoughts:  Yes.  without intent/plan  Homicidal Thoughts:  No  Memory:  Immediate;   Fair Recent;   Fair Remote;   Fair  Judgement:  Fair  Insight:  Present  Psychomotor  Activity:  Decreased and Psychomotor Retardation  Concentration:  Fair  Recall:  F[employer]Fair  Fund of Knowledge:NA  Language: Fair  Akathisia:  No  Handed:    AIMS (if indicated):     Assets:  Transportation Vocational/Educational  Sleep:  Number of Hours: 6   Musculoskeletal: Strength & Muscle Tone: within normal limits Gait & Station: normal Patient leans: N/A  Current Medications: Current Facility-Administered Medications  Medication Dose Route Frequency Provider Last Rate Last Dose  . acetaminophen (TYLENOL) tablet 650 mg  650 mg Oral Q6H PRN Kerry HoughSpencer E Simon, PA-C      . alum & mag hydroxide-simeth (MAALOX/MYLANTA) 200-200-20 MG/5ML suspension 30 mL  30 mL Oral Q4H PRN Kerry HoughSpencer E Simon, PA-C      . feeding supplement (ENSURE COMPLETE) (ENSURE COMPLETE) liquid 237 mL  237 mL Oral BID BM Tilda FrancoLindsey Baker, RD   237 mL at 01/30/14 1410  . fluvoxaMINE (LUVOX) tablet 50 mg  50 mg Oral BID Rachael FeeIrving A Cyndel Griffey, MD   50 mg at 01/30/14 1658  . gabapentin (NEURONTIN) capsule 600 mg  600 mg Oral QID Rachael FeeIrving A Burch Marchuk, MD   600 mg at 01/30/14 1658  . hydrOXYzine (ATARAX/VISTARIL) tablet 25 mg  25 mg Oral Q6H PRN Kerry HoughSpencer E Simon, PA-C   25 mg at 01/28/14 0636  . LORazepam (ATIVAN) tablet 1 mg  1 mg Oral Q6H PRN Rachael FeeIrving A Dylann Gallier, MD      . magnesium hydroxide (MILK OF MAGNESIA) suspension 30 mL  30 mL Oral Daily PRN Kerry HoughSpencer E Simon, PA-C      . multivitamin with minerals tablet  1 tablet  1 tablet Oral Daily Jomarie Longs, MD   1 tablet at 01/30/14 0813  . nicotine (NICODERM CQ - dosed in mg/24 hours) patch 21 mg  21 mg Transdermal Daily Jomarie Longs, MD   21 mg at 01/30/14 4132  . OLANZapine (ZYPREXA) injection 5 mg  5 mg Intramuscular Q6H PRN Saramma Eappen, MD      . OLANZapine zydis (ZYPREXA) disintegrating tablet 5 mg  5 mg Oral Q6H PRN Jomarie Longs, MD   5 mg at 01/30/14 1057  . QUEtiapine (SEROQUEL) tablet 300 mg  300 mg Oral QHS Rachael Fee, MD      . thiamine (B-1) injection 100 mg  100 mg Intramuscular  Once Jomarie Longs, MD   100 mg at 01/27/14 1013  . thiamine (VITAMIN B-1) tablet 100 mg  100 mg Oral Daily Jomarie Longs, MD   100 mg at 01/30/14 0814  . traZODone (DESYREL) tablet 150 mg  150 mg Oral QHS PRN Jomarie Longs, MD   150 mg at 01/29/14 2121    Lab Results:  Results for orders placed or performed during the hospital encounter of 01/26/14 (from the past 48 hour(s))  TSH     Status: None   Collection Time: 01/29/14  6:28 AM  Result Value Ref Range   TSH 3.540 0.350 - 4.500 uIU/mL    Comment: Performed at [facility]  Lipid panel     Status: Abnormal   Collection Time: 01/29/14  6:28 AM  Result Value Ref Range   Cholesterol 228 (H) 0 - 200 mg/dL   Triglycerides 440 (H) <150 mg/dL   HDL 38 (L) >10 mg/dL   Total CHOL/HDL Ratio 6.0 RATIO   VLDL 58 (H) 0 - 40 mg/dL   LDL Cholesterol 272 (H) 0 - 99 mg/dL    Comment:        Total Cholesterol/HDL:CHD Risk Coronary Heart Disease Risk Table                     Men   Women  1/2 Average Risk   3.4   3.3  Average Risk       5.0   4.4  2 X Average Risk   9.6   7.1  3 X Average Risk  23.4   11.0        Use the calculated Patient Ratio above and the CHD Risk Table to determine the patient's CHD Risk.        ATP III CLASSIFICATION (LDL):  <100     mg/dL   Optimal  536-644  mg/dL   Near or Above                    Optimal  130-159  mg/dL   Borderline  034-742  mg/dL   High  >595     mg/dL   Very High Performed at [facility]   Hemoglobin A1c     Status: None   Collection Time: 01/29/14  6:28 AM  Result Value Ref Range   Hgb A1c MFr Bld 5.6 <5.7 %    Comment: (NOTE)  According to the ADA Clinical Practice Recommendations for 2011, when HbA1c is used as a screening test:  >=6.5%   Diagnostic of Diabetes Mellitus           (if abnormal result is confirmed) 5.7-6.4%   Increased risk of developing Diabetes Mellitus References:Diagnosis and  Classification of Diabetes Mellitus,Diabetes Care,2011,34(Suppl 1):S62-S69 and Standards of Medical Care in         Diabetes - 2011,Diabetes Care,2011,34 (Suppl 1):S11-S61.    Mean Plasma Glucose 114 <117 mg/dL    Comment: Performed at [facility]    Physical Findings: AIMS: Facial and Oral Movements Muscles of Facial Expression: None, normal Lips and Perioral Area: None, normal Jaw: None, normal Tongue: None, normal,Extremity Movements Upper (arms, wrists, hands, fingers): None, normal Lower (legs, knees, ankles, toes): None, normal, Trunk Movements Neck, shoulders, hips: None, normal, Overall Severity Severity of abnormal movements (highest score from questions above): None, normal Incapacitation due to abnormal movements: None, normal Patient's awareness of abnormal movements (rate only patient's report): No Awareness,    CIWA:  CIWA-Ar Total: 3 COWS:     Treatment Plan Summary: Daily contact with patient to assess and evaluate symptoms and progress in treatment Medication management  Plan: Supportive approach/coping skills           Instal hope           CBT/mindfulness            Extend the Ativan            Increase the Luvox to 50 BID           Change the Seroquel to  IR to 300 mg HS ( He was using Seroquel IR 100 and                               Risperdal 3 when he was [place] from [place] [place] [place]            Medical Decision Making Problem Points:  Established problem, worsening (2) and Review of psycho-social stressors (1) Data Points:  Review of new medications or change in dosage (2)  I certify that inpatient services furnished can reasonably be expected to improve the patient's condition.   Meggie Laseter A 01/30/2014, 5:02 PM

## 2014-01-30 NOTE — Progress Notes (Signed)
D. Pt has been in his room for much of the evening, chose not to participate in evening activities, spoke about how he felt that he could not participate. Pt did receive medications without incident, did not verbalize any complaints of pain and appears depressed while interacting with staff. A. Support and encouragement provided. R. Safety maintained, will continue to monitor.

## 2014-01-31 MED ORDER — QUETIAPINE FUMARATE 400 MG PO TABS
400.0000 mg | ORAL_TABLET | Freq: Every day | ORAL | Status: DC
  Administered 2014-01-31 – 2014-02-01 (×2): 400 mg via ORAL
  Filled 2014-01-31: qty 2
  Filled 2014-01-31 (×3): qty 1

## 2014-01-31 MED ORDER — PANTOPRAZOLE SODIUM 40 MG PO TBEC
40.0000 mg | DELAYED_RELEASE_TABLET | Freq: Every day | ORAL | Status: DC
  Administered 2014-01-31 – 2014-02-02 (×3): 40 mg via ORAL
  Filled 2014-01-31 (×4): qty 1

## 2014-01-31 MED ORDER — CLONAZEPAM 1 MG PO TABS
1.0000 mg | ORAL_TABLET | Freq: Three times a day (TID) | ORAL | Status: DC
  Administered 2014-01-31 – 2014-02-02 (×7): 1 mg via ORAL
  Filled 2014-01-31 (×7): qty 1

## 2014-01-31 MED ORDER — QUETIAPINE FUMARATE 400 MG PO TABS
400.0000 mg | ORAL_TABLET | Freq: Every day | ORAL | Status: DC
  Filled 2014-01-31: qty 1

## 2014-01-31 NOTE — Progress Notes (Signed)
[facility] Group Notes:  (Nursing/MHT/Case Management/Adjunct)  Date:  01/31/2014  Time:  3:48 PM  Type of Therapy:  Therapeutic Activity  Participation Level:  Active  Participation Quality:  Appropriate, Attentive and Supportive  Affect:  Appropriate  Engagement in Group:  Engaged   [name] C 01/31/2014, 3:48 PM 

## 2014-01-31 NOTE — Progress Notes (Signed)
D. Pt has been in room much of the evening, very minimal interaction or participation. Pt does appear flat and depressed this evening and does endorse as well. Pt received IVC papers this evening and was provided with explanation as to what they were. Pt also received all medications without incident and continues to predominantly isolate to room. A. Support and encouragement provided. R. Safety maintained, will continue to monitor.

## 2014-01-31 NOTE — Progress Notes (Signed)
[facility]  01/31/2014 12:47 PM Phillip [name]  MRN:  [id number] Subjective:  States that the anxiety is overwhelming incapacitating. States he cant move forward. He states he does not see the use of taking all this medications he is being given. Sates that after a lot of trials the Klonopin seems to be the only one medication that worked for him. He states in a way he has given up, [place]  ( source of stress for him) but feels he does not have the energy nor the will to do it. He states that the Klonopin the way he was prescribed was working for him until he OD after the incident with his Fiancee Diagnosis:   DSM5: Trauma-Stressor Disorders:  PTSD Depressive Disorders:  Major Depressive Disorder - Severe (296.23) Total Time spent with patient: 30 minutes  Axis I: Generalized Anxiety Disorder and Panic Disorder  ADL's:  Intact  Sleep: Fair  Appetite:  Fair  Psychiatric Specialty Exam: Physical Exam  Review of Systems  Constitutional: Positive for malaise/fatigue.  HENT: Negative.   Eyes: Negative.   Respiratory: Negative.   Cardiovascular: Negative.   Gastrointestinal: Negative.   Genitourinary: Negative.   Musculoskeletal: Negative.   Skin: Negative.   Neurological: Positive for weakness.  Endo/Heme/Allergies: Negative.   Psychiatric/Behavioral: Positive for depression. The patient is nervous/anxious.     Blood pressure 126/80, pulse 66, temperature 98.4 F (36.9 C), temperature source Oral, resp. rate 14, height 5' 10.87" (1.8 m), weight 75.297 kg (166 lb).Body mass index is 23.24 kg/(m^2).  General Appearance: Fairly Groomed  Patent attorneyye Contact::  Fair  Speech:  Clear and Coherent, Slow and not spontaneous  Volume:  Decreased  Mood:  Anxious and Depressed  Affect:  Restricted  Thought Process:  Coherent and Goal Directed  Orientation:  Full (Time, Place, and Person)  Thought Content:  symptoms worries concerns   Suicidal Thoughts:  No  Homicidal Thoughts:  No  Memory:  Immediate;   Fair Recent;   Fair Remote;   Fair  Judgement:  Fair  Insight:  Present  Psychomotor Activity:  Decreased  Concentration:  Fair  Recall:  F[employer]ervFair  Fund of Knowledge:NA  Language: Fair  Akathisia:  No  Handed:    AIMS (if indicated):     Assets:  Talents/Skills  Sleep:  Number of Hours: 6   Musculoskeletal: Strength & Muscle Tone: within normal limits Gait & Station: normal Patient leans: N/A  Current Medications: Current Facility-Administered Medications  Medication Dose Route Frequency Provider Last Rate Last Dose  . acetaminophen (TYLENOL) tablet 650 mg  650 mg Oral Q6H PRN Kerry HoughSpencer E Simon, PA-C      . alum & mag hydroxide-simeth (MAALOX/MYLANTA) 200-200-20 MG/5ML suspension 30 mL  30 mL Oral Q4H PRN Kerry HoughSpencer E Simon, PA-C      . clonazePAM (KLONOPIN) tablet 1 mg  1 mg Oral TID AC Rachael FeeIrving A Calia Napp, MD   1 mg at 01/31/14 1212  . feeding supplement (ENSURE COMPLETE) (ENSURE COMPLETE) liquid 237 mL  237 mL Oral BID BM Tilda FrancoLindsey Baker, RD   237 mL at 01/31/14 0958  . fluvoxaMINE (LUVOX) tablet 50 mg  50 mg Oral BID Rachael FeeIrving A Shamecka Hocutt, MD   50 mg at 01/31/14 0955  . gabapentin (NEURONTIN) capsule 600 mg  600 mg Oral QID Rachael FeeIrving A Khylin Gutridge, MD   600 mg at 01/31/14 1212  . hydrOXYzine (ATARAX/VISTARIL) tablet 25 mg  25 mg Oral  Q6H PRN Kerry HoughSpencer E Simon, PA-C   25 mg at 01/28/14 0636  . magnesium hydroxide (MILK OF MAGNESIA) suspension 30 mL  30 mL Oral Daily PRN Kerry HoughSpencer E Simon, PA-C      . multivitamin with minerals tablet 1 tablet  1 tablet Oral Daily Jomarie LongsSaramma Eappen, MD   1 tablet at 01/31/14 0956  . nicotine (NICODERM CQ - dosed in mg/24 hours) patch 21 mg  21 mg Transdermal Daily Jomarie LongsSaramma Eappen, MD   21 mg at 01/31/14 0955  . pantoprazole (PROTONIX) EC tablet 40 mg  40 mg Oral Daily Rachael FeeIrving A Tishawna Larouche, MD   40 mg at 01/31/14 1212  . QUEtiapine (SEROQUEL) tablet 300 mg  300 mg Oral QHS Rachael FeeIrving A Esperansa Sarabia, MD   300 mg at 01/30/14 2137  .  thiamine (B-1) injection 100 mg  100 mg Intramuscular Once Jomarie LongsSaramma Eappen, MD   100 mg at 01/27/14 1013  . thiamine (VITAMIN B-1) tablet 100 mg  100 mg Oral Daily Jomarie LongsSaramma Eappen, MD   100 mg at 01/31/14 0956  . traZODone (DESYREL) tablet 150 mg  150 mg Oral QHS PRN Jomarie LongsSaramma Eappen, MD   150 mg at 01/30/14 2137    Lab Results: No results found for this or any previous visit (from the past 48 hour(s)).  Physical Findings: AIMS: Facial and Oral Movements Muscles of Facial Expression: None, normal Lips and Perioral Area: None, normal Jaw: None, normal Tongue: None, normal,Extremity Movements Upper (arms, wrists, hands, fingers): None, normal Lower (legs, knees, ankles, toes): None, normal, Trunk Movements Neck, shoulders, hips: None, normal, Overall Severity Severity of abnormal movements (highest score from questions above): None, normal Incapacitation due to abnormal movements: None, normal Patient's awareness of abnormal movements (rate only patient's report): No Awareness, Dental Status Current problems with teeth and/or dentures?: No Does patient usually wear dentures?: No  CIWA:  CIWA-Ar Total: 3 COWS:     Treatment Plan Summary: Daily contact with patient to assess and evaluate symptoms and progress in treatment Medication management  Plan: Supportive approach/coping skills           Will give a trial with the Klonopin and reassess effectiveness Medical Decision Making Problem Points:  Review of psycho-social stressors (1) Data Points:  Review of medication regiment & side effects (2) Review of new medications or change in dosage (2)  I certify that inpatient services furnished can reasonably be expected to improve the patient's condition.   [place] A 01/31/2014, 12:47 PM

## 2014-01-31 NOTE — [facility] Group Notes (Signed)
0900 nursing orientation group    The focus of this group is to educate the patient on the purpose and policies of crisis stabilization and provide a format to answer questions about their admission.  The group details unit policies and expectations of patients while admitted.   Pt was an active participant in group and was appropriate in sharing with the group.  

## 2014-01-31 NOTE — [facility] Group Notes (Signed)
[facility] Group Notes:  (Nursing/MHT/Case Management/Adjunct)  Date:  01/31/2014  Time:  3:22 PM  Type of Therapy:  Psychoeducational Skills  Participation Level:  Minimal  Participation Quality:  Appropriate and Sharing  Affect:  Depressed and Flat  Cognitive:  Alert  Insight:  Limited  Engagement in Group:  Limited and Poor  Modes of Intervention:  Activity, Discussion, Education and Problem-solving  Summary of Progress/Problems:pt was able to obtain some problem solving skills and was appropriate and supportive to his peers.    Jule SerKent, Noelani Harbach Gail 01/31/2014, 3:22 PM

## 2014-01-31 NOTE — Progress Notes (Signed)
The focus of this group is to help patients review their daily goal of treatment and discuss progress on daily workbooks. Pt stated that his goal for the day was to resolve/address his issues. Pt stated he feels that he made progress toward this goal today. Writer encouraged pt to be more specific about his issues and his progress toward addressing them, but pt did not want to.

## 2014-01-31 NOTE — [facility] Group Notes (Signed)
[facility] LCSW Group Therapy  01/31/2014 3:02 PM  Type of Therapy:  Group Therapy  Participation Level:  Active  Participation Quality:  Appropriate and Attentive  Affect:  Appropriate  Cognitive:  Alert, Appropriate and Oriented  Insight:  Improving  Engagement in Therapy:  Engaged  Modes of Intervention:  Discussion  Summary of Progress/Problems:Group topic was about working through perception.  Identifying and managing between good and bad perception. Group processed the challenges with this as well as the potential rewards of developing balanced perceptions of others and themselves.  Group processed that a part of self perception involves identifying positive traits and activities that encourage empowerment.  Group shared these self reflections of traits and methods for empowerment.   Phillip [name] MSW, LCSW  [name], Phillip [name] 01/31/2014, 3:02 PM

## 2014-01-31 NOTE — Progress Notes (Signed)
D) Pt has been attending the groups and interacts minimally with his peers. Does not volunteer any information without being asked except to ask and advocate for his sleep medications. Stays to himself. Rates his depression at a 0, hopelessness at a 0 and his anxiety at a 2.  A) given support, reassurance and praise. Encouragement given. R) Denies SI and HI.

## 2014-02-01 NOTE — [facility] Group Notes (Signed)
[facility] LCSW Group Therapy  02/01/2014 2:56 PM  Type of Therapy:  Group Therapy  Participation Level:  Did Not Attend  Participation Quality:  N/A  Affect:  N/A  Cognitive:  N/A  Insight:  N/A  Engagement in Therapy:  N/A  Modes of Intervention:  Discussion, Education, Exploration, Rapport Building and Support  Summary of Progress/Problems: Pt did not attend.  Seabron SpatesVaughn, Lesly Pontarelli Anne 02/01/2014, 2:56 PM

## 2014-02-01 NOTE — [facility] Group Notes (Signed)
[facility] Group Notes:  relaxation  Date:  02/01/2014  Time:  10:31 AM  Type of Therapy:  Nurse Education  Participation Level:  Active  Participation Quality:  Appropriate  Affect:  Appropriate  Cognitive:  Appropriate  Insight:  Appropriate  Engagement in Group:  Engaged  Modes of Intervention:  Education  Summary of Progress/Problems:pt stated his three pillars are: his religion, self and his daugher  [name], [name] 02/01/2014, 10:31 AM

## 2014-02-01 NOTE — [facility] Group Notes (Signed)
[facility] Group Notes:  Life skills  Date:  02/01/2014  Time:  12:25 PM  Type of Therapy:  Nurse Education  Participation Level:  Active  Participation Quality:  Appropriate  Affect:  Appropriate  Cognitive:  Alert  Insight:  Appropriate  Engagement in Group:  Engaged  Modes of Intervention:  Education  Summary of Progress/Problems:Pt admits to SI attempts and would like to find happiness  [name], Rodrigo Mcgranahan Guyes 02/01/2014, 12:25 PM

## 2014-02-01 NOTE — Progress Notes (Signed)
Adult Psychoeducational Group Note  Date:  02/01/2014 Time:  5:21 PM  Group Topic/Focus: Therapeutic Activity    Participation Level:  Active  Participation Quality:  Appropriate  Affect:  Appropriate  Cognitive:  Appropriate  Insight: Appropriate  Engagement in Group:  Engaged  Modes of Intervention:  Activity  Additional Comments:  Pt attended group this afternoon. Pt participate in therapeutic ball activity group with peers. Pt was pleasant and appropriate in group.

## 2014-02-01 NOTE — Progress Notes (Signed)
[facility]  02/01/2014 2:15 PM Phillip [name]  MRN:  [id number] Subjective: Phillip [name] has been able to communicate with his girlfriend and share that he is not going to go back with her and that she needs to change some of her ways and that she needs help. States he has been trying to cope with her behavior for a long time but he cant anymore. States that while at work as the time was getting closer to go home he would  Get more anxious. States that when he OD he did not see a way out as he did not want to upset her. Now everything is out in the open. She has agreed that she needs help. He is not going back with her, he is going to stay with a friend and put some distance between them. He is going to start processing a [place].  He states he has done really well on the Klonopin for years and that he does not see himself as being able to function without it given his severe anxiety-agoraphobia. States that the circumstances that led to the OD are not going to be there any more Diagnosis:   DSM5: Trauma-Stressor Disorders:  Posttraumatic Stress Disorder (309.81) Depressive Disorders:  Major Depressive Disorder - Moderate (296.22) Total Time spent with patient: 30 minutes  Axis I: Generalized Anxiety Disorder and Panic Disorder  ADL's:  Intact  Sleep: Fair  Appetite:  Fair   Psychiatric Specialty Exam: Physical Exam  Review of Systems  Constitutional: Negative.   HENT: Negative.   Eyes: Negative.   Respiratory: Negative.   Cardiovascular: Negative.   Gastrointestinal: Negative.   Genitourinary: Negative.   Musculoskeletal: Negative.   Skin: Negative.   Neurological: Negative.   Endo/Heme/Allergies: Negative.   Psychiatric/Behavioral: The patient is nervous/anxious.     Blood pressure 135/97, pulse 83, temperature 98.4 F (36.9 C), temperature source Oral, resp. rate 16, height 5' 10.87" (1.8 m), weight 75.297 kg (166 lb).Body mass index is 23.24 kg/(m^2).   General Appearance: Fairly Groomed  Patent attorneyye Contact::  Fair  Speech:  Clear and Coherent  Volume:  Normal  Mood:  Anxious  Affect:  worried  Thought Process:  Coherent and Goal Directed  Orientation:  Full (Time, Place, and Person)  Thought Content:  plans as he anticiapates a possible D/C   Suicidal Thoughts:  No  Homicidal Thoughts:  No  Memory:  Immediate;   Fair Recent;   Fair Remote;   Fair  Judgement:  Fair  Insight:  Present  Psychomotor Activity:  Normal  Concentration:  Fair  Recall:  [employer]Fair  Fund of Knowledge:NA  Language: Fair  Akathisia:  No  Handed:    AIMS (if indicated):     Assets:  Desire for Improvement Housing Transportation Vocational/Educational  Sleep:  Number of Hours: 6.5   Musculoskeletal: Strength & Muscle Tone: within normal limits Gait & Station: normal Patient leans: N/A  Current Medications: Current Facility-Administered Medications  Medication Dose Route Frequency Provider Last Rate Last Dose  . acetaminophen (TYLENOL) tablet 650 mg  650 mg Oral Q6H PRN Phillip HoughSpencer E Simon, Phillip [name]      . alum & mag hydroxide-simeth (MAALOX/MYLANTA) 200-200-20 MG/5ML suspension 30 mL  30 mL Oral Q4H PRN Phillip HoughSpencer E Simon, Phillip [name]      . clonazePAM (KLONOPIN) tablet 1 mg  1 mg Oral TID AC Phillip FeeIrving A Buffey Zabinski, MD   1 mg at 02/01/14 1156  . feeding supplement (ENSURE COMPLETE) (ENSURE COMPLETE) liquid 237  mL  237 mL Oral BID BM Phillip [name], RD   237 mL at 02/01/14 1158  . fluvoxaMINE (LUVOX) tablet 50 mg  50 mg Oral BID Phillip FeeIrving A Niley Helbig, MD   50 mg at 02/01/14 0855  . gabapentin (NEURONTIN) capsule 600 mg  600 mg Oral QID Phillip FeeIrving A Trusten Hume, MD   600 mg at 02/01/14 1156  . hydrOXYzine (ATARAX/VISTARIL) tablet 25 mg  25 mg Oral Q6H PRN Phillip HoughSpencer E Simon, Phillip [name]   25 mg at 01/28/14 0636  . magnesium hydroxide (MILK OF MAGNESIA) suspension 30 mL  30 mL Oral Daily PRN Phillip HoughSpencer E Simon, Phillip [name]      . multivitamin with minerals tablet 1 tablet  1 tablet Oral Daily Phillip LongsSaramma Eappen, MD   1 tablet at  02/01/14 0855  . nicotine (NICODERM CQ - dosed in mg/24 hours) patch 21 mg  21 mg Transdermal Daily Phillip LongsSaramma Eappen, MD   21 mg at 02/01/14 0857  . pantoprazole (PROTONIX) EC tablet 40 mg  40 mg Oral Daily Phillip FeeIrving A Ricard Faulkner, MD   40 mg at 02/01/14 0854  . QUEtiapine (SEROQUEL) tablet 400 mg  400 mg Oral QHS Phillip FeeIrving A Jniyah Dantuono, MD   400 mg at 01/31/14 2013  . thiamine (B-1) injection 100 mg  100 mg Intramuscular Once Phillip LongsSaramma Eappen, MD   100 mg at 01/27/14 1013  . thiamine (VITAMIN B-1) tablet 100 mg  100 mg Oral Daily Phillip LongsSaramma Eappen, MD   100 mg at 02/01/14 [id number]    Lab Results: No results found for this or any previous visit (from the past 48 hour(s)).  Physical Findings: AIMS: Facial and Oral Movements Muscles of Facial Expression: None, normal Lips and Perioral Area: None, normal Jaw: None, normal Tongue: None, normal,Extremity Movements Upper (arms, wrists, hands, fingers): None, normal Lower (legs, knees, ankles, toes): None, normal, Trunk Movements Neck, shoulders, hips: None, normal, Overall Severity Severity of abnormal movements (highest score from questions above): None, normal Incapacitation due to abnormal movements: None, normal Patient's awareness of abnormal movements (rate only patient's report): No Awareness, Dental Status Current problems with teeth and/or dentures?: No Does patient usually wear dentures?: No  CIWA:  CIWA-Ar Total: 3 COWS:     Treatment Plan Summary: Daily contact with patient to assess and evaluate symptoms and progress in treatment Medication management  Plan: Supportive approach/coping skills           Optimize response to the psychotropics           CBT;minidfulness  Medical Decision Making Problem Points:  Review of psycho-social stressors (1) Data Points:  Review of medication regiment & side effects (2) Review of new medications or change in dosage (2)  I certify that inpatient services furnished can reasonably be expected to improve the  patient's condition.   Jakita Dutkiewicz A 02/01/2014, 2:15 PM

## 2014-02-01 NOTE — Progress Notes (Signed)
Patient did attend the evening speaker AA meeting.  

## 2014-02-01 NOTE — Progress Notes (Signed)
D) Pt has been attending the groups and interacting with his peers. Today was able to be engaged in group and respond and participate with his peers and the leaders of the group. Maintained good eye contact throughout the groups. Pt rates his depression and hopelessness both as a 0 and his anxiety as a 2. A) Given support, reassurance and praise along with encouragement. Provided with a 1:1. Active listening provided. R) Pt denies SI and HI.

## 2014-02-01 NOTE — Progress Notes (Signed)
D. Pt was up and visible in milieu this evening, able to attend and participate in evening group activity. Pt spoke about how he is still dealing with his anxiety and spoke about how he feels the medications that he has been taking so far do not seem to be helping. Pt spoke briefly about the stress in his life and how he may handle the stress in the future. Pt is hopeful that the increase in seroquel that he received this evening will be beneficial to him. A. Support and encouragement provided. R. Safety maintained, will continue to monitor.

## 2014-02-02 DIAGNOSIS — F332 Major depressive disorder, recurrent severe without psychotic features: Principal | ICD-10-CM

## 2014-02-02 MED ORDER — QUETIAPINE FUMARATE 400 MG PO TABS: 400.0000 mg | ORAL_TABLET | Freq: Every day | ORAL | Status: DC

## 2014-02-02 MED ORDER — CLONAZEPAM 1 MG PO TABS: 1.0000 mg | ORAL_TABLET | Freq: Three times a day (TID) | ORAL | Status: DC

## 2014-02-02 MED ORDER — FLUVOXAMINE MALEATE 50 MG PO TABS: 50.0000 mg | ORAL_TABLET | Freq: Two times a day (BID) | ORAL | Status: DC

## 2014-02-02 MED ORDER — PANTOPRAZOLE SODIUM 40 MG PO TBEC: 40.0000 mg | DELAYED_RELEASE_TABLET | Freq: Every day | ORAL | Status: DC

## 2014-02-02 MED ORDER — NICOTINE 21 MG/24HR TD PT24: 21.0000 mg | MEDICATED_PATCH | Freq: Every day | TRANSDERMAL | Status: DC

## 2014-02-02 MED ORDER — GABAPENTIN 600 MG PO TABS
600.0000 mg | ORAL_TABLET | Freq: Four times a day (QID) | ORAL | Status: DC
  Filled 2014-02-02: qty 56

## 2014-02-02 MED ORDER — GABAPENTIN 300 MG PO CAPS: 600.0000 mg | ORAL_CAPSULE | Freq: Four times a day (QID) | ORAL | Status: DC

## 2014-02-02 MED ORDER — HYDROXYZINE HCL 25 MG PO TABS: 25.0000 mg | ORAL_TABLET | Freq: Four times a day (QID) | ORAL | Status: DC | PRN

## 2014-02-02 NOTE — Progress Notes (Signed)
[name], male   DOB: 03-Dec-1970, 43 y.o.   MRN: [id number] Late entry for discharge  At 12:00  He was discharged home and was going to meet his girlfriend. He voiced understanding of discharge teaching about his medication and follow up plan. He denies SI thoughts and all belongs were taken home with him.  He stated that he was going to return to work.

## 2014-02-02 NOTE — [facility] Suicide Risk Assessment (Signed)
[facility] INPATIENT:  Family/Significant Other Suicide Prevention Education  Suicide Prevention Education:  Patient Refusal for Family/Significant Other Suicide Prevention Education: The patient Phillip [name] has refused to provide written consent for family/significant other to be provided Family/Significant Other Suicide Prevention Education during admission and/or prior to discharge.  Physician notified. SPE reviewed with patient, brochure provided.  Helene Bernstein, West CarboKristin L 02/02/2014, 10:34 AM

## 2014-02-02 NOTE — [facility] Group Notes (Signed)
   [facility]  02/02/2014  8:45 AM   Participation Quality: Alert, Appropriate and Oriented  Mood/Affect: Depressed and Flat  Depression Rating: 1  Anxiety Rating: 2  Thoughts of Suicide: Pt denies SI/HI  Will you contract for safety? Yes  Current AVH: Pt denies  Plan for Discharge/Comments: Pt attended discharge planning group and actively participated in group. CSW provided pt with today's workbook. Patient to return home today and plans to follow up with his PCP for medication management needs. Declines referral for therapy services at this time.  Transportation Means: Pt reports access to transportation- patient requesting bus pass.  Supports: No supports mentioned at this time  Samuella BruinKristin Naya Ilagan, MSW, [employer] IncLCSWA Clinical Social Worker [facility] [phone]

## 2014-02-02 NOTE — [facility] Suicide Risk Assessment (Signed)
Suicide Risk Assessment  Discharge Assessment     Demographic Factors:  Male and Caucasian  Total Time spent with patient: 45 minutes  Psychiatric Specialty Exam:     Blood pressure 128/82, pulse 64, temperature 97.8 F (36.6 C), temperature source Oral, resp. rate 16, height 5' 10.87" (1.8 m), weight 75.297 kg (166 lb).Body mass index is 23.24 kg/(m^2).  General Appearance: Fairly Groomed  Patent attorneyye Contact::  Fair  Speech:  Clear and Coherent  Volume:  Normal  Mood:  Euthymic  Affect:  Appropriate  Thought Process:  Coherent and Goal Directed  Orientation:  Full (Time, Place, and Person)  Thought Content:  Deals with the plans as he moves on  Suicidal Thoughts:  No  Homicidal Thoughts:  No  Memory:  Immediate;   Fair Recent;   Fair Remote;   Fair  Judgement:  Fair  Insight:  Present  Psychomotor Activity:  Normal  Concentration:  Fair  Recall:  FiservFair  Fund of Knowledge:NA  Language: Fair  Akathisia:  No  Handed:    AIMS (if indicated):     Assets:  Desire for Improvement Housing Vocational/Educational  Sleep:  Number of Hours: 4.5    Musculoskeletal: Strength & Muscle Tone: within normal limits Gait & Station: normal Patient leans: N/A   Mental Status Per Nursing Assessment::   On Admission:     Current Mental Status by Physician: In full contact with reality. There are no active SI plans or intent. His mood is euthymic his affect is appropriate. He plans to stay with a friend and get give his GF space and continue to encourage her to go to counseling. He would still want to try to transfer to [place]   Loss Factors: NA  Historical Factors: NA  Risk Reduction Factors:   Sense of responsibility to family, Employed, Living with another person, especially a relative and Positive social support  Continued Clinical Symptoms:  Severe Anxiety and/or Agitation Panic Attacks Depression:   Impulsivity  Cognitive Features That Contribute To Risk: None  identified   Suicide Risk:  Minimal: No identifiable suicidal ideation.  Patients presenting with no risk factors but with morbid ruminations; may be classified as minimal risk based on the severity of the depressive symptoms  Discharge Diagnoses:   AXIS I:  Panic Disorder with agoraphobia, Major Depression recurrent, PTSD AXIS II:  No diagnosis AXIS III:   Past Medical History  Diagnosis Date  . Anxiety   . Depression    AXIS IV:  other psychosocial or environmental problems AXIS V:  61-70 mild symptoms  Plan Of Care/Follow-up recommendations:  Activity:  as tolerated Diet:  regular Follow up [facility] and his PCP for the Klonopin Is patient on multiple antipsychotic therapies at discharge:  No   Has Patient had three or more failed trials of antipsychotic monotherapy by history:  No  Recommended Plan for Multiple Antipsychotic Therapies: NA    Zetha Kuhar A 02/02/2014, 9:55 AM

## 2014-02-02 NOTE — Progress Notes (Signed)
[facility] :  Will you be returning to the same living situation after discharge: Yes,  patient will return home  At discharge, do you have transportation home?:Yes,  patient requesting bus pass Do you have the ability to pay for your medications:Yes,  patient will be provided with prescriptions and medication samples  Release of information consent forms completed and in the chart;  Patient's signature needed at discharge.  Patient to Follow up at: Follow-up Information    Follow up with Patient declines follow up.      Patient denies SI/HI:   Yes,  denies    Aeronautical engineerafety Planning and Suicide Prevention discussed:  Yes,  with patient  Phillip [name], Phillip [name] 02/02/2014, 10:35 AM

## 2014-02-02 NOTE — Progress Notes (Signed)
D. Pt has been up and visible in milieu this evening, has attended and participated in group activity. Pt shared about his on-going issues and how he has been coping with anxiety. Pt did endorse that he feels the medications have been beneficial to him and spoke about his impending discharge and how he feels ready. Pt does not appear to be in any acute distress this evening. A. Support and encouragement provided. R. Safety maintained, will continue to monitor.

## 2014-02-02 NOTE — Discharge Summary (Signed)
Physician Discharge Summary Note  Patient:  Phillip [name] is an 43 y.o., male MRN:  [id number] DOB:  03/08/1971 Patient phone:  [phone] (home)  Patient address:   [address]  [place] [address],  Total Time spent with patient: 45 minutes  Date of Admission:  01/26/2014 Date of Discharge: 02/02/2014  Reason for Admission:  Suicide attempt, depression  Discharge Diagnoses: DSM5: Trauma-Stressor Disorders: Posttraumatic Stress Disorder (309.81) Depressive Disorders: Major Depressive Disorder - Moderate (296.22)  Active Problems:   Panic disorder with agoraphobia   Generalized anxiety disorder   MDD (major depressive disorder), recurrent episode, severe   PTSD (post-traumatic stress disorder)   Major depressive disorder, recurrent, severe without psychotic features   Psychiatric Specialty Exam: Physical Exam  ROS  Blood pressure 128/82, pulse 64, temperature 97.8 F (36.6 C), temperature source Oral, resp. rate 16, height 5' 10.87" (1.8 m), weight 75.297 kg (166 lb).Body mass index is 23.24 kg/(m^2).   Past Psychiatric History: Diagnosis: GAD,Panic disorder ,MDD  Hospitalizations:once at [facility]  Outpatient Care:Yes in the past ,but currently being managed by PMD  Substance Abuse Care:Denies  Self-Mutilation:yes ,in the past  Suicidal Attempts:yes x2 ,OD on pills twice  Violent Behaviors:denies   Musculoskeletal: Strength & Muscle Tone: within normal limits Gait & Station: normal Patient leans: N/A  DSM5:  Trauma-Stressor Disorders: Posttraumatic Stress Disorder (309.81) Depressive Disorders: Major Depressive Disorder - Moderate (296.22)  Schizophrenia Disorders:  NA Obsessive-Compulsive Disorders:  NA Trauma-Stressor Disorders:  NA Substance/Addictive Disorders:  NA Depressive Disorders:  Major Depressive Disorder - Severe (296.23)  Axis Diagnosis:   AXIS I:  Generalized Anxiety Disorder, Major Depression, Recurrent severe,  Panic Disorder and Post Traumatic Stress Disorder AXIS II:  Deferred AXIS III:   Past Medical History  Diagnosis Date  . Anxiety   . Depression    AXIS IV:  economic problems, occupational problems and other psychosocial or environmental problems AXIS V:  61-70 mild symptoms  Level of Care:  OP  Hospital Course:   Phillip [name] is an 43 y.o. Caucasian male who pesented to [facility] after a suicide attempt. PT reported that things are not going well with his fianc and he took a bunch of pills. Pt attempted suicide by overdose on his prescription medications which included seroquel, klonopin, risperdal and trazodone. Pt reported that he has attempted suicide multiple times in the past by overdose. Pt reported that he was hospitalized at [facility] approximately 3 weeks ago due to suicidal thoughts. Pt reported that he is compliant with his medication but feels that his medications are not working for him.  Hx of takingZoloft ,Effexor, cymbalta, prozac, paxil, celexa, lexapro as well as seroquel ,trazodone and may be Zyprexa.  Phillip [name] was in [employer] and served in [employer] and in [place] in the 1990's.  This has caused him to have several flashbacks and has caused him enormous feelings of guilt.  His relationship with girlfriend continues to be a stressor for him.  Patient has stated on discharge that he will be adherent to medications and outpatient treatment.  Stated that the circumstances that led to the OD are not going to be there any more.  Patient's moods improved during his stay.  At time of discharge, he rated both depression and anxiety levels to be manageable and minimal.  Patient was able to identify the triggers of her emotional crises and de-stabilizations.  He identified the positive things in his life that would help him deal better with feelings of loss,  depression and etoh dependence.  He did well with the medications prescribed.  Denied physiological concerns/SI/HI/AVH at time of  discharge.  He has satisfactory support network and home environment and will adhere to medication compliance and outpatient treatment.     Consults:  psychiatry  Significant Diagnostic Studies:  labs: Per ED  Discharge Vitals:   Blood pressure 128/82, pulse 64, temperature 97.8 F (36.6 C), temperature source Oral, resp. rate 16, height 5' 10.87" (1.8 m), weight 75.297 kg (166 lb). Body mass index is 23.24 kg/(m^2). Lab Results:   No results found for this or any previous visit (from the past 72 hour(s)).  Physical Findings: AIMS: Facial and Oral Movements Muscles of Facial Expression: None, normal Lips and Perioral Area: None, normal Jaw: None, normal Tongue: None, normal,Extremity Movements Upper (arms, wrists, hands, fingers): None, normal Lower (legs, knees, ankles, toes): None, normal, Trunk Movements Neck, shoulders, hips: None, normal, Overall Severity Severity of abnormal movements (highest score from questions above): None, normal Incapacitation due to abnormal movements: None, normal Patient's awareness of abnormal movements (rate only patient's report): No Awareness, Dental Status Current problems with teeth and/or dentures?: No Does patient usually wear dentures?: No  CIWA:  CIWA-Ar Total: 0 COWS:     Psychiatric Specialty Exam: See Psychiatric Specialty Exam and Suicide Risk Assessment completed by Attending Physician prior to discharge.  Discharge destination:  Home  Is patient on multiple antipsychotic therapies at discharge:  No   Has Patient had three or more failed trials of antipsychotic monotherapy by history:  No  Recommended Plan for Multiple Antipsychotic Therapies: NA     Medication List    STOP taking these medications        gabapentin 800 MG tablet  Commonly known as:  NEURONTIN  Replaced by:  gabapentin 300 MG capsule     RISPERDAL 3 MG tablet  Generic drug:  risperiDONE     traZODone 100 MG tablet  Commonly known as:  DESYREL       TAKE these medications      Indication   clonazePAM 1 MG tablet  Commonly known as:  KLONOPIN  Take 1 tablet (1 mg total) by mouth 3 (three) times daily before meals.   Indication:  Severe Anxiety, agoraphobia     fluvoxaMINE 50 MG tablet  Commonly known as:  LUVOX  Take 1 tablet (50 mg total) by mouth 2 (two) times daily.   Indication:  Depression     gabapentin 300 MG capsule  Commonly known as:  NEURONTIN  Take 2 capsules (600 mg total) by mouth 4 (four) times daily.   Indication:  Pain     hydrOXYzine 25 MG tablet  Commonly known as:  ATARAX/VISTARIL  Take 1 tablet (25 mg total) by mouth every 6 (six) hours as needed for anxiety.   Indication:  Anxiety Neurosis     nicotine 21 mg/24hr patch  Commonly known as:  NICODERM CQ - dosed in mg/24 hours  Place 1 patch (21 mg total) onto the skin daily.   Indication:  Nicotine Addiction     pantoprazole 40 MG tablet  Commonly known as:  PROTONIX  Take 1 tablet (40 mg total) by mouth daily.   Indication:  Gastroesophageal Reflux Disease     QUEtiapine 400 MG tablet  Commonly known as:  SEROQUEL  Take 1 tablet (400 mg total) by mouth at bedtime.   Indication:  Trouble Sleeping           Follow-up Information  Follow up with Patient declines follow up.      Follow-up recommendations:  Activity:  As tolerated Diet:  As tolerated  Comments:  1.  Take all your medications as prescribed.              2.  Report any adverse side effects to outpatient provider.                       3.  Patient instructed to not use alcohol or illegal drugs while on prescription medicines.            4.  In the event of worsening symptoms, instructed patient to call 911, the crisis hotline or go to nearest emergency room for evaluation of symptoms.  Total Discharge Time:  Greater than 30 minutes.  SignedAdonis Brook: AGUSTIN, SHEILA MAY, AGNP-BC 02/02/2014, 2:31 PM  I personally assessed the patient and formulated the plan Madie RenoIrving A. Dub MikesLugo, M.D.

## 2014-02-06 NOTE — Progress Notes (Signed)
Patient Discharge Instructions:  No documentation was faxed for HBIPS.  Per the SW the patient declined follow up. Jerelene ReddenSheena E Edgar, 02/06/2014, 3:06 PM

## 2014-05-17 ENCOUNTER — Emergency Department (HOSPITAL_COMMUNITY)
Admission: EM | Admit: 2014-05-17 | Discharge: 2014-05-18 | Disposition: A | Discharge status: Other Acute Inpatient Hospital | Payer: Federal, State, Local not specified - Other | Attending: Emergency Medicine | Admitting: Emergency Medicine

## 2014-05-17 ENCOUNTER — Encounter (HOSPITAL_COMMUNITY): Payer: Self-pay

## 2014-05-17 DIAGNOSIS — F333 Major depressive disorder, recurrent, severe with psychotic symptoms: Secondary | ICD-10-CM | POA: Insufficient documentation

## 2014-05-17 DIAGNOSIS — F332 Major depressive disorder, recurrent severe without psychotic features: Secondary | ICD-10-CM | POA: Diagnosis present

## 2014-05-17 DIAGNOSIS — Z79899 Other long term (current) drug therapy: Secondary | ICD-10-CM | POA: Diagnosis not present

## 2014-05-17 DIAGNOSIS — F1310 Sedative, hypnotic or anxiolytic abuse, uncomplicated: Secondary | ICD-10-CM | POA: Insufficient documentation

## 2014-05-17 DIAGNOSIS — F1210 Cannabis abuse, uncomplicated: Secondary | ICD-10-CM | POA: Insufficient documentation

## 2014-05-17 DIAGNOSIS — F419 Anxiety disorder, unspecified: Secondary | ICD-10-CM | POA: Diagnosis not present

## 2014-05-17 DIAGNOSIS — Z720 Tobacco use: Secondary | ICD-10-CM | POA: Diagnosis not present

## 2014-05-17 DIAGNOSIS — F4310 Post-traumatic stress disorder, unspecified: Secondary | ICD-10-CM | POA: Insufficient documentation

## 2014-05-17 DIAGNOSIS — R45851 Suicidal ideations: Secondary | ICD-10-CM | POA: Diagnosis present

## 2014-05-17 LAB — CBC WITH DIFFERENTIAL/PLATELET
BASOS PCT: 0 % (ref 0–1)
Basophils Absolute: 0 10*3/uL (ref 0.0–0.1)
EOS PCT: 2 % (ref 0–5)
Eosinophils Absolute: 0.2 10*3/uL (ref 0.0–0.7)
HCT: 48.5 % (ref 39.0–52.0)
HEMOGLOBIN: 16.7 g/dL (ref 13.0–17.0)
LYMPHS PCT: 38 % (ref 12–46)
Lymphs Abs: 2.9 10*3/uL (ref 0.7–4.0)
MCH: 32.7 pg (ref 26.0–34.0)
MCHC: 34.4 g/dL (ref 30.0–36.0)
MCV: 94.9 fL (ref 78.0–100.0)
MONOS PCT: 7 % (ref 3–12)
Monocytes Absolute: 0.5 10*3/uL (ref 0.1–1.0)
NEUTROS PCT: 53 % (ref 43–77)
Neutro Abs: 3.9 10*3/uL (ref 1.7–7.7)
Platelets: 260 10*3/uL (ref 150–400)
RBC: 5.11 MIL/uL (ref 4.22–5.81)
RDW: 12.6 % (ref 11.5–15.5)
WBC: 7.4 10*3/uL (ref 4.0–10.5)

## 2014-05-17 MED ORDER — ONDANSETRON HCL 4 MG PO TABS: 4.0000 mg | ORAL_TABLET | Freq: Three times a day (TID) | ORAL | Status: DC | PRN

## 2014-05-17 MED ORDER — ACETAMINOPHEN 325 MG PO TABS: 650.0000 mg | ORAL_TABLET | ORAL | Status: DC | PRN

## 2014-05-17 MED ORDER — NICOTINE 21 MG/24HR TD PT24
21.0000 mg | MEDICATED_PATCH | Freq: Every day | TRANSDERMAL | Status: DC
  Administered 2014-05-18 (×2): 21 mg via TRANSDERMAL
  Filled 2014-05-17 (×2): qty 1

## 2014-05-17 MED ORDER — LORAZEPAM 1 MG PO TABS
1.0000 mg | ORAL_TABLET | Freq: Three times a day (TID) | ORAL | Status: DC | PRN
  Administered 2014-05-18 (×3): 1 mg via ORAL
  Filled 2014-05-17 (×3): qty 1

## 2014-05-17 MED ORDER — ZOLPIDEM TARTRATE 5 MG PO TABS
5.0000 mg | ORAL_TABLET | Freq: Every evening | ORAL | Status: DC | PRN
  Administered 2014-05-18: 5 mg via ORAL
  Filled 2014-05-17: qty 1

## 2014-05-17 NOTE — ED Notes (Signed)
Pt presents with c/o suicidal thoughts that started months ago. Pt reports he has not been seeking any psychiatric treatment. [facility] was called by girlfriend, is at bedside at this time. Pt reports plan to shoot himself. Pt denies HI, calm and cooperative at this time.

## 2014-05-17 NOTE — BH Assessment (Signed)
Received notification of TTS consult request. Spoke with Antony MaduraKelly Humes, PA-C who said Pt contacted [facility] reporting suicidal ideation with thoughts of shooting himself. Tele-assessment will be initiated.  Harlin RainFord Ellis Ria CommentWarrick Jr, LPC, [facility] Triage Specialist [phone]

## 2014-05-17 NOTE — ED Provider Notes (Signed)
CSN: [id number]     Arrival date & time 05/17/14  2100 History  This chart was scribed for Antony Madura, PA-C, working with Purvis Sheffield, MD by Chestine Spore, ED Scribe. The patient was seen in room WTR3/WLPT3 at 9:31 PM.    Chief Complaint  Patient presents with  . Suicidal    The history is provided by the patient. No language interpreter was used.    HPI Comments: Phillip [name] is a 44 y.o. male with a medical hx of anxiety and depression who presents to the Emergency Department complaining of suicidal. Pt states that "He just doesn't feel well and he just doesn't want to be here anymore." Pt thoughts have become worse over the past couple months. Pt has access to firearms and his plan is to shoot himself. Denies alcohol or drug use. Pt is not taking his medications for his depression because there are too many of them. He notes that he can not tell a difference in if the medications work for his depression. Pt has been hospitalized once before for his thoughts. Pt has not been seeking Psychiatric treatment and he notes that he is supposed to. Pt is open for help. Pt reports that he hasn't slept in a couple days and that he just wants to sleep. Pt is having associated symptoms of fatigue and anxiety. He denies HI and any other symptoms.  Per mobile crisis notes: "Client is experiencing.hallucinations. Clients states he wants to kill himself by shooting himself, client does not have a gun in his possession. Client states that he feels hopeless and has nothing to live for and "just wants to get it over with".. Client experiences hopelessness, worthlessness, sadness, low energy levels, low motivation levels, decreased appetite, and decreased sleeping patterns. Client has an intense fear of going out in public and believes that others are out to get him..Client reports that he has nightmares and flashbacks regarding his time spent in the [employer] and in the [employer] war... Client states he will not  go too [place] [facility]. He prefers to go to [employer]."   Past Medical History  Diagnosis Date  . Anxiety   . Depression    Past Surgical History  Procedure Laterality Date  . Cholesterol     Family History  Problem Relation Age of Onset  . Cancer Mother   . Cancer Father   . Depression Father    History  Substance Use Topics  . Smoking status: Current Every Day Smoker -- 1.00 packs/day for 19 years    Types: Cigarettes  . Smokeless tobacco: Not on file  . Alcohol Use: No    Review of Systems  Constitutional: Positive for fatigue.  Psychiatric/Behavioral: Positive for suicidal ideas. The patient is nervous/anxious.     Allergies  Review of patient's allergies indicates no known allergies.  Home Medications   Prior to Admission medications   Medication Sig Start Date End Date Taking? Authorizing Provider  clonazePAM (KLONOPIN) 1 MG tablet Take 1 tablet (1 mg total) by mouth 3 (three) times daily before meals. 02/02/14  Yes Velna Hatchet May Agustin, NP  Pseudoeph-Doxylamine-DM-APAP (NYQUIL PO) Take 30 mLs by mouth at bedtime as needed (sleep).   Yes Historical Provider, MD  fluvoxaMINE (LUVOX) 50 MG tablet Take 1 tablet (50 mg total) by mouth 2 (two) times daily. Patient not taking: Reported on 05/17/2014 02/02/14   Velna Hatchet May Agustin, NP  gabapentin (NEURONTIN) 300 MG capsule Take 2 capsules (600 mg total) by mouth  4 (four) times daily. Patient not taking: Reported on 05/17/2014 02/02/14   [facility], NP  hydrOXYzine (ATARAX/VISTARIL) 25 MG tablet Take 1 tablet (25 mg total) by mouth every 6 (six) hours as needed for anxiety. Patient not taking: Reported on 05/17/2014 02/02/14   [facility], NP  nicotine (NICODERM CQ - DOSED IN MG/24 HOURS) 21 mg/24hr patch Place 1 patch (21 mg total) onto the skin daily. Patient not taking: Reported on 05/17/2014 02/02/14   Velna Hatchet May Agustin, NP  pantoprazole (PROTONIX) 40 MG tablet Take 1 tablet (40 mg total) by mouth  daily. Patient not taking: Reported on 05/17/2014 02/02/14   Velna Hatchet May Agustin, NP  QUEtiapine (SEROQUEL) 400 MG tablet Take 1 tablet (400 mg total) by mouth at bedtime. Patient not taking: Reported on 05/17/2014 02/02/14   Velna Hatchet May Agustin, NP   BP 120/82 mmHg  Pulse 79  Temp(Src) 98.1 F (36.7 C) (Oral)  Resp 18  SpO2 99%  Physical Exam  Constitutional: He is oriented to person, place, and time. He appears well-developed and well-nourished. No distress.  HENT:  Head: Normocephalic and atraumatic.  Eyes: Conjunctivae and EOM are normal. No scleral icterus.  Neck: Normal range of motion.  Pulmonary/Chest: Effort normal. No respiratory distress.  Musculoskeletal: Normal range of motion.  Neurological: He is alert and oriented to person, place, and time. He exhibits normal muscle tone. Coordination normal.  Skin: Skin is warm and dry. No rash noted. He is not diaphoretic. No erythema. No pallor.  Psychiatric: His speech is normal. He is withdrawn. He exhibits a depressed mood. He expresses suicidal ideation. He expresses no homicidal ideation. He expresses suicidal plans. He expresses no homicidal plans.  Nursing note and vitals reviewed.   ED Course  Procedures (including critical care time) DIAGNOSTIC STUDIES: Oxygen Saturation is 99% on RA, normal by my interpretation.    COORDINATION OF CARE: 9:38 PM-Discussed treatment plan which includes UA, CBC, Zofran, Tylenol, Ativan  with pt at bedside and pt agreed to plan.   Labs Review Labs Reviewed  COMPREHENSIVE METABOLIC PANEL - Abnormal; Notable for the following:    Glucose, Bld 204 (*)    GFR calc non Af Amer 81 (*)    All other components within normal limits  ACETAMINOPHEN LEVEL - Abnormal; Notable for the following:    Acetaminophen (Tylenol), Serum <10.0 (*)    All other components within normal limits  CBC WITH DIFFERENTIAL/PLATELET  SALICYLATE LEVEL  ETHANOL  URINE RAPID DRUG SCREEN (HOSP PERFORMED)    Imaging  Review No results found.   EKG Interpretation None      MDM   Final diagnoses:  Major depressive disorder, recurrent, severe without psychotic features  PTSD (post-traumatic stress disorder)    44 year old male presents to the emergency department for further evaluation of suicidal thoughts. Patient reports thoughts of wanting to shoot himself with a gun. Patient has been medically cleared and evaluated by TTS. He meets criteria for inpatient treatment at this time. No beds available at [facility]. Placement is currently pending. Disposition to be determined by oncoming ED provider.  I personally performed the services described in this documentation, which was scribed in my presence. The recorded information has been reviewed and is accurate.   Filed Vitals:   05/17/14 2122 05/18/14 0626  BP: 120/82 116/68  Pulse: 79 74  Temp: 98.1 F (36.7 C) 97.5 F (36.4 C)  TempSrc: Oral Oral  Resp: 18 16  SpO2: 99% 95%  Antony MaduraKelly Jaquala Fuller, PA-C 05/18/14 [id number]  Purvis SheffieldForrest Harrison, MD 05/26/14 [phone]

## 2014-05-17 NOTE — ED Notes (Signed)
Pt. and belongings wanded by security 

## 2014-05-17 NOTE — BH Assessment (Addendum)
Tele Assessment Note   Phillip SailorsSteven [name] is an 44 y.o. male, single, Caucasian who [facility], who was not present during assessment. Per Pt's chart he has a history of Posttraumatic Stress Disorder and Major Depressive Disorder. Pt reports he has felt very depressed for the past two months with symptoms including daily crying spells, insomnia, nightmare, deceased appetite, fatigue, decreased concentration and feelings of hopelessness. He reports he is having daily panic attacks. He reports sleeping 1-2 hours per night for the past month. He states he has lost approximately 15 lbs over the past two months and only eats once per day. Pt states he has been perseverating on trauma experienced while serving in the [employer] during [employer]. He reports fear of being around other people. Pt states he is a burden on his children and fiancee and feels his only option is to kill himself. He says he has attempted suicide by overdose twice in the past and through online research realized "it is difficult to kill yourself by overdose." Pt state he purchased a pistol for the purpose of killing himself and that he has hidden the gun outside his home so his children cannot access it. Pt reports he sometimes seeing shadows of people and "I hear a voice inside my head saying that it is okay, just kill yourself." Pt denies homicidal ideation. Pt denies alcohol or substance abuse.  Pt states he lost a good job two months ago following [facility] following a suicide attempt by overdose. He reports living with his fiancee and his three children, ages 3610, 314 and 2716. He states his medications are prescribed by his primary care physician Phillip RamusMark [name]. Pt reports he stopped taking his medications a few weeks ago "because I just didn't feel like taking them." He reports [facility] but [facility] not want to  [name] because he had a bad experience there.  Pt is dressed in hospital scrubs, alert, oriented x4 with soft speech and normal motor behavior. Eye contact is good. Pt's mood is depressed and affect is flat. Thought process is coherent and relevant. Pt appears to have some difficulty concentrating but is able to answer questions appropriately. There is no indication Pt is currently responding to internal stimuli or experiencing delusional thought content. Pt was calm and cooperative throughout assessment. He is agreeable to inpatient psychiatric treatment.   Axis I: Posttraumatic Stress Disorder; Major Depressive Disorder, Recurrent, Severe Axis II: Deferred Axis III:  Past Medical History  Diagnosis Date  . Anxiety   . Depression    Axis IV: economic problems, occupational problems and other psychosocial or environmental problems Axis V: GAF=28  Past Medical History:  Past Medical History  Diagnosis Date  . Anxiety   . Depression     Past Surgical History  Procedure Laterality Date  . Cholesterol      Family History:  Family History  Problem Relation Age of Onset  . Cancer Mother   . Cancer Father   . Depression Father     Social History:  reports that he has been smoking Cigarettes.  He has a 19 pack-year smoking history. He does not have any smokeless tobacco history on file. He reports that he does not drink alcohol or use illicit drugs.  Additional Social History:  Alcohol / Drug Use Pain Medications: Denies abuse Prescriptions: Denies abuse Over the  Counter: Denies abuse History of alcohol / drug use?: No history of alcohol / drug abuse Longest period of sobriety (when/how long): NA  CIWA: CIWA-Ar BP: 120/82 mmHg Pulse Rate: 79 COWS:    PATIENT STRENGTHS: (choose at least two) Ability for insight Average or above average intelligence Capable of independent living Communication skills General fund of knowledge Motivation for  treatment/growth Physical Health Supportive family/friends Work skills  Allergies: No Known Allergies  Home Medications:  (Not in a hospital admission)  OB/GYN Status:  No LMP for male patient.  General Assessment Data Location of Assessment: [facility] ED Is this a Tele or Face-to-Face Assessment?: Tele Assessment Is this an Initial Assessment or a Re-assessment for this encounter?: Initial Assessment Living Arrangements: Other (Comment) Phillip [name], children ages 5, 25,16) Can pt return to current living arrangement?: Yes Admission Status: Voluntary Is patient capable of signing voluntary admission?: Yes Transfer from: Home Referral Source: Other ([facility])     [facility] Care Plan Living Arrangements: Other (Comment) Phillip [name], children ages 27, 30,16) Name of Psychiatrist: Unknown Name of Therapist: None  Education Status Is patient currently in school?: No Current Grade: NA Highest grade of school patient has completed: NA Name of school: NA Contact person: NA  Risk to self with the past 6 months Suicidal Ideation: Yes-Currently Present Suicidal Intent: Yes-Currently Present Is patient at risk for suicide?: Yes Suicidal Plan?: Yes-Currently Present Specify Current Suicidal Plan: Plan to shoot himself Access to Means: Yes Specify Access to Suicidal Means: Pt reports he bought a pistol and has it hidden outside his home What has been your use of drugs/alcohol within the last 12 months?: Pt denies Previous Attempts/Gestures: Yes How many times?: 2 Other Self Harm Risks: None Triggers for Past Attempts: Other (Comment) (PTSD symptoms) Intentional Self Injurious Behavior: None Family Suicide History: No Recent stressful life event(s): Job Loss Persecutory voices/beliefs?: No Depression: Yes Depression Symptoms: Despondent, Insomnia, Tearfulness, Isolating, Fatigue, Guilt, Loss of interest in usual pleasures, Feeling worthless/self pity Substance abuse history and/or  treatment for substance abuse?: No Suicide prevention information given to non-admitted patients: Not applicable  Risk to Others within the past 6 months Homicidal Ideation: No Thoughts of Harm to Others: No Current Homicidal Intent: No Current Homicidal Plan: No Access to Homicidal Means: No Identified Victim: None History of harm to others?: No Assessment of Violence: None Noted Violent Behavior Description: None Does patient have access to weapons?: Yes (Comment) (Pt reports access to a gun) Criminal Charges Pending?: No Does patient have a court date: No  Psychosis Hallucinations: Visual (Reports seeing shadows of people) Delusions: None noted  Mental Status Report Appear/Hygiene: In scrubs Eye Contact: Good Motor Activity: Unremarkable Speech: Logical/coherent Level of Consciousness: Alert Mood: Depressed Affect: Flat Anxiety Level: None Thought Processes: Coherent, Relevant Judgement: Partial Orientation: Person, Place, Time, Situation, Appropriate for developmental age Obsessive Compulsive Thoughts/Behaviors: None  Cognitive Functioning Concentration: Decreased Memory: Recent Intact, Remote Intact IQ: Average Insight: Fair Impulse Control: Fair Appetite: Poor Weight Loss: 15 Weight Gain: 0 Sleep: Decreased Total Hours of Sleep: 2 Vegetative Symptoms: None  ADLScreening [facility]) Patient's cognitive ability adequate to safely complete daily activities?: Yes Patient able to express need for assistance with ADLs?: Yes Independently performs ADLs?: Yes (appropriate for developmental age)  Prior Inpatient Therapy Prior Inpatient Therapy: Yes Prior Therapy Dates: 02/2014; 01/2014; multiple admits Prior Therapy Facilty/Provider(s): [facility] Reason for Treatment: PTSD,MDD  Prior Outpatient Therapy Prior Outpatient Therapy: Yes Prior Therapy Dates: unknown Prior Therapy Facilty/Provider(s):  unknown Reason for Treatment: PTSD,  MDD  ADL Screening (condition at time of admission) Patient's cognitive ability adequate to safely complete daily activities?: Yes Is the patient deaf or have difficulty hearing?: No Does the patient have difficulty seeing, even when wearing glasses/contacts?: No Does the patient have difficulty concentrating, remembering, or making decisions?: No Patient able to express need for assistance with ADLs?: Yes Does the patient have difficulty dressing or bathing?: No Independently performs ADLs?: Yes (appropriate for developmental age) Does the patient have difficulty walking or climbing stairs?: No Weakness of Legs: None Weakness of Arms/Hands: None  Home Assistive Devices/Equipment Home Assistive Devices/Equipment: None    Abuse/Neglect Assessment (Assessment to be complete while patient is alone) Physical Abuse: Denies Verbal Abuse: Denies Sexual Abuse: Denies Exploitation of patient/patient's resources: Denies Self-Neglect: Denies     Merchant navy officer (For Healthcare) Does patient have an advance directive?: No Would patient like information on creating an advanced directive?: No - patient declined information    Additional Information 1:1 In Past 12 Months?: No CIRT Risk: No Elopement Risk: No Does patient have medical clearance?: Yes     Disposition: Binnie Rail, AC at [facility], who confirmed adult unit is at capacity. Gave clinical report to Maryjean Morn, PA-C who said Pt meets criteria for inpatient psychiatric treatment and  recommends [facility] be contacted for available bed. Communicated recommendation to  [employer], PA-C.  Disposition Initial Assessment Completed for this Encounter: Yes Disposition of Patient: Inpatient treatment program Type of inpatient treatment program: Adult   Pamalee Leyden, [facility], [facility] Triage Specialist [phone]   Pamalee Leyden 05/17/2014 11:39 PM

## 2014-05-18 ENCOUNTER — Inpatient Hospital Stay (HOSPITAL_COMMUNITY)
Admission: AD | Admit: 2014-05-18 | Discharge: 2014-05-27 | DRG: 885 | Disposition: A | Discharge status: Home / Self Care | Payer: Federal, State, Local not specified - Other | Source: Intra-hospital | Attending: Psychiatry | Admitting: Psychiatry

## 2014-05-18 ENCOUNTER — Encounter (HOSPITAL_COMMUNITY): Payer: Self-pay

## 2014-05-18 DIAGNOSIS — R45851 Suicidal ideations: Secondary | ICD-10-CM

## 2014-05-18 DIAGNOSIS — K5900 Constipation, unspecified: Secondary | ICD-10-CM | POA: Diagnosis present

## 2014-05-18 DIAGNOSIS — Z809 Family history of malignant neoplasm, unspecified: Secondary | ICD-10-CM | POA: Diagnosis not present

## 2014-05-18 DIAGNOSIS — R51 Headache: Secondary | ICD-10-CM

## 2014-05-18 DIAGNOSIS — F410 Panic disorder [episodic paroxysmal anxiety] without agoraphobia: Secondary | ICD-10-CM | POA: Diagnosis present

## 2014-05-18 DIAGNOSIS — G4700 Insomnia, unspecified: Secondary | ICD-10-CM | POA: Diagnosis present

## 2014-05-18 DIAGNOSIS — F17210 Nicotine dependence, cigarettes, uncomplicated: Secondary | ICD-10-CM | POA: Diagnosis present

## 2014-05-18 DIAGNOSIS — K219 Gastro-esophageal reflux disease without esophagitis: Secondary | ICD-10-CM | POA: Diagnosis present

## 2014-05-18 DIAGNOSIS — F4001 Agoraphobia with panic disorder: Secondary | ICD-10-CM | POA: Diagnosis present

## 2014-05-18 DIAGNOSIS — N390 Urinary tract infection, site not specified: Secondary | ICD-10-CM

## 2014-05-18 DIAGNOSIS — F515 Nightmare disorder: Secondary | ICD-10-CM | POA: Diagnosis present

## 2014-05-18 DIAGNOSIS — F333 Major depressive disorder, recurrent, severe with psychotic symptoms: Principal | ICD-10-CM | POA: Diagnosis present

## 2014-05-18 DIAGNOSIS — M549 Dorsalgia, unspecified: Secondary | ICD-10-CM

## 2014-05-18 DIAGNOSIS — F4310 Post-traumatic stress disorder, unspecified: Secondary | ICD-10-CM | POA: Diagnosis present

## 2014-05-18 DIAGNOSIS — F419 Anxiety disorder, unspecified: Secondary | ICD-10-CM

## 2014-05-18 DIAGNOSIS — F411 Generalized anxiety disorder: Secondary | ICD-10-CM | POA: Diagnosis present

## 2014-05-18 DIAGNOSIS — R519 Headache, unspecified: Secondary | ICD-10-CM

## 2014-05-18 LAB — COMPREHENSIVE METABOLIC PANEL
ALBUMIN: 4.6 g/dL (ref 3.5–5.2)
ALK PHOS: 81 U/L (ref 39–117)
ALT: 41 U/L (ref 0–53)
AST: 26 U/L (ref 0–37)
Anion gap: 9 (ref 5–15)
BUN: 21 mg/dL (ref 6–23)
CHLORIDE: 108 mmol/L (ref 96–112)
CO2: 22 mmol/L (ref 19–32)
Calcium: 9.5 mg/dL (ref 8.4–10.5)
Creatinine, Ser: 1.1 mg/dL (ref 0.50–1.35)
GFR, EST NON AFRICAN AMERICAN: 81 mL/min — AB (ref >90)
Glucose, Bld: 204 mg/dL — ABNORMAL HIGH (ref 70–99)
Potassium: 3.7 mmol/L (ref 3.5–5.1)
Sodium: 139 mmol/L (ref 135–145)
Total Bilirubin: 0.5 mg/dL (ref 0.3–1.2)
Total Protein: 7.7 g/dL (ref 6.0–8.3)

## 2014-05-18 LAB — RAPID URINE DRUG SCREEN, HOSP PERFORMED
AMPHETAMINES: NOT DETECTED
BARBITURATES: NOT DETECTED
Benzodiazepines: POSITIVE — AB
COCAINE: NOT DETECTED
OPIATES: NOT DETECTED
Tetrahydrocannabinol: POSITIVE — AB

## 2014-05-18 LAB — ETHANOL

## 2014-05-18 LAB — SALICYLATE LEVEL: Salicylate Lvl: <4 mg/dL (ref 2.8–20.0)

## 2014-05-18 LAB — ACETAMINOPHEN LEVEL

## 2014-05-18 MED ORDER — TRAZODONE HCL 50 MG PO TABS
50.0000 mg | ORAL_TABLET | Freq: Every evening | ORAL | Status: DC | PRN
  Administered 2014-05-18 – 2014-05-19 (×2): 50 mg via ORAL
  Filled 2014-05-18 (×7): qty 1

## 2014-05-18 MED ORDER — MAGNESIUM HYDROXIDE 400 MG/5ML PO SUSP: 30.0000 mL | Freq: Every day | ORAL | Status: DC | PRN

## 2014-05-18 MED ORDER — IBUPROFEN 600 MG PO TABS
600.0000 mg | ORAL_TABLET | Freq: Four times a day (QID) | ORAL | Status: DC | PRN
  Administered 2014-05-18 – 2014-05-21 (×2): 600 mg via ORAL
  Filled 2014-05-18 (×2): qty 1

## 2014-05-18 MED ORDER — QUETIAPINE FUMARATE 100 MG PO TABS: 200.0000 mg | ORAL_TABLET | Freq: Every day | ORAL | Status: DC

## 2014-05-18 MED ORDER — GABAPENTIN 300 MG PO CAPS
600.0000 mg | ORAL_CAPSULE | Freq: Four times a day (QID) | ORAL | Status: DC
  Filled 2014-05-18: qty 2

## 2014-05-18 MED ORDER — GABAPENTIN 300 MG PO CAPS
600.0000 mg | ORAL_CAPSULE | Freq: Three times a day (TID) | ORAL | Status: DC
  Administered 2014-05-19 – 2014-05-27 (×25): 600 mg via ORAL
  Filled 2014-05-18 (×33): qty 2

## 2014-05-18 MED ORDER — HYDROXYZINE HCL 25 MG PO TABS
25.0000 mg | ORAL_TABLET | Freq: Four times a day (QID) | ORAL | Status: DC | PRN
Start: 2014-05-18 — End: 2014-05-27
  Administered 2014-05-18 – 2014-05-25 (×6): 25 mg via ORAL
  Filled 2014-05-18 (×2): qty 1
  Filled 2014-05-18: qty 30
  Filled 2014-05-18 (×4): qty 1

## 2014-05-18 MED ORDER — DIPHENHYDRAMINE HCL 25 MG PO CAPS
25.0000 mg | ORAL_CAPSULE | Freq: Once | ORAL | Status: AC
  Administered 2014-05-18: 25 mg via ORAL
  Filled 2014-05-18: qty 1

## 2014-05-18 MED ORDER — HYDROXYZINE HCL 25 MG PO TABS
25.0000 mg | ORAL_TABLET | Freq: Four times a day (QID) | ORAL | Status: DC | PRN
  Administered 2014-05-18: 25 mg via ORAL
  Filled 2014-05-18: qty 1

## 2014-05-18 MED ORDER — ALUM & MAG HYDROXIDE-SIMETH 200-200-20 MG/5ML PO SUSP
30.0000 mL | ORAL | Status: DC | PRN
  Administered 2014-05-20 – 2014-05-21 (×2): 30 mL via ORAL
  Filled 2014-05-18 (×2): qty 30

## 2014-05-18 MED ORDER — TRAZODONE HCL 50 MG PO TABS: 50.0000 mg | ORAL_TABLET | Freq: Every evening | ORAL | Status: DC | PRN

## 2014-05-18 MED ORDER — QUETIAPINE FUMARATE 200 MG PO TABS
200.0000 mg | ORAL_TABLET | Freq: Every day | ORAL | Status: DC
  Administered 2014-05-18 – 2014-05-19 (×2): 200 mg via ORAL
  Filled 2014-05-18 (×4): qty 1

## 2014-05-18 MED ORDER — LORAZEPAM 1 MG PO TABS
1.0000 mg | ORAL_TABLET | Freq: Once | ORAL | Status: AC
  Administered 2014-05-18: 1 mg via ORAL
  Filled 2014-05-18: qty 1

## 2014-05-18 MED ORDER — ENSURE COMPLETE PO LIQD
237.0000 mL | Freq: Two times a day (BID) | ORAL | Status: DC
  Administered 2014-05-19 – 2014-05-27 (×12): 237 mL via ORAL

## 2014-05-18 MED ORDER — QUETIAPINE FUMARATE 200 MG PO TABS: 200.0000 mg | ORAL_TABLET | Freq: Every day | ORAL | Status: DC

## 2014-05-18 MED ORDER — GABAPENTIN 300 MG PO CAPS
600.0000 mg | ORAL_CAPSULE | Freq: Three times a day (TID) | ORAL | Status: DC
  Administered 2014-05-18 (×2): 600 mg via ORAL
  Filled 2014-05-18: qty 2

## 2014-05-18 NOTE — ED Notes (Signed)
Patient tearful at times.  States he does not feel he needs to be on the psych unit.  Asked if he could leave and he was informed that if he tried to leave or sign out AMA he would be placed under involuntary commitment.  [facility].

## 2014-05-18 NOTE — ED Notes (Signed)
[facility] transport requested. 

## 2014-05-18 NOTE — ED Notes (Signed)
Pt AAO x 3, resting at present, no distress noted, [facility] and [facility] transfer.  Report attempted x 1, unit, unsuccessful, unit to return call.

## 2014-05-18 NOTE — [facility] Counselor (Signed)
Contacted the following facilities for placement:  No appropriate beds currently available.  AT CAPACITY: [facility], per [facility], per [facility], per [facility], per [facility], per [facility], per [facility], per [facility], per [facility], per [facility], per [facility], per [facility], per [facility], per [facility], per [facility], per [facility]  NO RESPONSE: [name]   Elyon Zoll Ellis Iza Preston Jr, LPC, [facility] Triage Specialist [phone]   

## 2014-05-18 NOTE — BH Assessment (Signed)
[facility] Assessment Progress Note  Per Thedore MinsMojeed Akintayo, MD this pt requires psychiatric hospitalization at this time. Thurman CoyerEric Kaplan, RN, [facility] to Rm 302-1. Pt has signed Voluntary Admission and Consent for Treatment, as well as Consent to Release Information, andsigned forms have [facility]. Pt's nurse, Dawnaly, has been informed. She agrees to send original paperwork along with pt via [facility], and to call report to [phone].  Doylene Canninghomas Mahasin Riviere, MA Triage Specialist 05/18/2014 @ 16:18

## 2014-05-18 NOTE — ED Notes (Addendum)
Patient awakened by what he described as nightmares. He has a history of such due to PTSD from being [place] [employer]. He says he's been having nightmares since he went to sleep. Patient received ambien for sleep tonight and has not had it previously. Provider notified and new orders received.

## 2014-05-18 NOTE — ED Notes (Signed)
Patient made aware that a urine specimen is needed. 

## 2014-05-18 NOTE — ED Notes (Signed)
Report called to RN Kathlene NovemberMike, pending transfer after 9pm.

## 2014-05-18 NOTE — ED Notes (Signed)
Psychiatry at bedside.

## 2014-05-18 NOTE — Consult Note (Signed)
[facility] Face-to-Face Psychiatry Consult   Reason for Consult:  Psychiatric Evaluation Referring Physician:  EDP Patient Identification: Phillip [name] MRN:  [id number] Principal Diagnosis: Major Depressive Disorder, recurrent severe with psychotic features Diagnosis:   Patient Active Problem List   Diagnosis Date Noted  . Major depressive disorder, recurrent, severe without psychotic features [F33.2]     Priority: High  . PTSD (post-traumatic stress disorder) [F43.10] 01/29/2014    Priority: High  . MDD (major depressive disorder), recurrent episode, severe [F33.2] 01/27/2014  . 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 Generalized anxiety disorder [F41.1] 06/19/2011    Class: Chronic    Total Time spent with patient: 45 minutes  Subjective:   Phillip [name] is a 44 y.o. male patient who states "I'm not feeling well, always scared and feel like people are after me." He presented voluntarily to [facility] ED on 05/17/14 for evaluation after calling [facility].   HPI:  44 yo male presenting voluntary for evaluation of depression. States he can't leave the house and has lost "a really good job because of it." States he has not had a full time job since 2015. He currently lives in [place] with his fiance and his 3 children. He has previously been seeing an outpatient psychiatrist in [place]. 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  States he does not want to go to any [facility] facility "because I don't trust the [employer]." He endorses suicidal ideation with a plan to overdose or shoot himself with a gun.  He says he has attempted suicide by overdose twice in the past and through online research has realized "it is difficult to kill yourself by overdose." He states he purchased a gun for the purpose of killing himself and that he has hidden the gun outside his home so his children cannot access it. In addition to major depression,  he also has a history of PTSD.  Pt states he has been thinking about trauma experienced while serving in the [employer] during [employer]. He reports fear of being around other people. He feel he is a burden on his children and fiancee and feels his only option is to kill himself. He endorses feeling very depressed for the past two months with symptoms including daily crying spells, insomnia, nightmares, deceased appetite, fatigue, decreased concentration and feelings of hopelessness. He reports he is having daily panic attacks. He reports sleeping 1-2 hours per night for the past month. He states he stopped his medications about 2 months ago "because I didn't fell like they were working." He reports prior hospitalization at [facility] 2 months ago.  He reports auditory hallucinations that say "I've been here long enough and this would be the best decision." He denies homicidal ideation, intent or plan.   He is agreeable to inpatient psychiatric hospitalization.   HPI Elements:   Location:  mood. Quality:  depressed. Severity:  severe. Timing:  chronic. Duration:  prior 2 weeks. Context:  stressors, unemployment.  Past Medical History:  Past Medical History  Diagnosis Date  . Anxiety   . Depression     Past Surgical History  Procedure Laterality Date  . Cholesterol     Family History:  Family History  Problem Relation Age of Onset  . Cancer Mother   . Cancer Father   . Depression Father    Social History:  History  Alcohol Use No     History  Drug Use No    History   Social History  . Marital Status: Married    Spouse Name: N/A  . Number of Children: N/A  . Years of Education: N/A   Social History Main Topics  . Smoking status: Current Every Day Smoker -- 1.00 packs/day for 19 years    Types: Cigarettes  . Smokeless tobacco: Not on file  . Alcohol Use: No  . Drug Use: No  . Sexual Activity: Yes   Other Topics Concern  . None   Social History Narrative    Additional Social History:    Pain Medications: Denies abuse Prescriptions: Denies abuse Over the Counter: Denies abuse History of alcohol / drug use?: No history of alcohol / drug abuse Longest period of sobriety (when/how long): NA Allergies:  No Known Allergies  Vitals: Blood pressure 112/76, pulse 67, temperature 98.2 F (36.8 C), temperature source Oral, resp. rate 17, SpO2 95 %.  Risk to Self: Suicidal Ideation: Yes-Currently Present Suicidal Intent: Yes-Currently Present Is patient at risk for suicide?: Yes Suicidal Plan?: Yes-Currently Present Specify Current Suicidal Plan: Plan to shoot himself Access to Means: Yes Specify Access to Suicidal Means: Pt reports he bought a pistol and has it hidden outside his home What has been your use of drugs/alcohol within the last 12 months?: Pt denies How many times?: 2 Other Self Harm Risks: None Triggers for Past Attempts: Other (Comment) (PTSD symptoms) Intentional Self Injurious Behavior: None Risk to Others: Homicidal Ideation: No Thoughts of Harm to Others: No Current Homicidal Intent: No Current Homicidal Plan: No Access to Homicidal Means: No Identified Victim: None History of harm to others?: No Assessment of Violence: None Noted Violent Behavior Description: None Does patient have access to weapons?: Yes (Comment) (Pt reports access to a gun) Criminal Charges Pending?: No Does patient have a court date: No Prior Inpatient Therapy: Prior Inpatient Therapy: Yes Prior Therapy Dates: 02/2014; 01/2014; multiple admits [facility] Reason for Treatment: PTSD,MDD Prior Outpatient Therapy: Prior Outpatient Therapy: Yes Prior Therapy Dates: unknown Prior Therapy Facilty/Provider(s): unknown Reason for Treatment: PTSD, MDD  Current Facility-Administered Medications  Medication Dose Route Frequency Provider Last Rate Last Dose  . acetaminophen (TYLENOL) tablet 650 mg  650 mg  Oral Q4H PRN Antony MaduraKelly Humes, PA-C      . LORazepam (ATIVAN) tablet 1 mg  1 mg Oral Q8H PRN Antony MaduraKelly Humes, PA-C   1 mg at 05/18/14 1402  . nicotine (NICODERM CQ - dosed in mg/24 hours) patch 21 mg  21 mg Transdermal Daily Antony MaduraKelly Humes, PA-C   21 mg at 05/18/14 0956  . ondansetron (ZOFRAN) tablet 4 mg  4 mg Oral Q8H PRN Antony MaduraKelly Humes, PA-C       Current Outpatient Prescriptions  Medication Sig Dispense Refill  . clonazePAM (KLONOPIN) 1 MG tablet Take 1 tablet (1 mg total) by mouth 3 (three) times daily before meals. 15 tablet 1  . Pseudoeph-Doxylamine-DM-APAP (NYQUIL PO) Take 30 mLs by mouth at bedtime as needed (sleep).    . fluvoxaMINE (LUVOX) 50 MG tablet Take 1 tablet (50 mg total) by mouth 2 (two) times daily. (Patient not taking: Reported on 05/17/2014) 60 tablet 0  . gabapentin (NEURONTIN) 300 MG capsule Take 2 capsules (600 mg total) by mouth 4 (four) times daily. (Patient not taking: Reported on 05/17/2014) 240 capsule 0  . hydrOXYzine (ATARAX/VISTARIL) 25 MG tablet Take 1 tablet (25 mg total) by mouth every 6 (six) hours as needed for anxiety. (Patient  not taking: Reported on 05/17/2014) 30 tablet 0  . nicotine (NICODERM CQ - DOSED IN MG/24 HOURS) 21 mg/24hr patch Place 1 patch (21 mg total) onto the skin daily. (Patient not taking: Reported on 05/17/2014) 7 patch 0  . pantoprazole (PROTONIX) 40 MG tablet Take 1 tablet (40 mg total) by mouth daily. (Patient not taking: Reported on 05/17/2014) 30 tablet 0  . QUEtiapine (SEROQUEL) 400 MG tablet Take 1 tablet (400 mg total) by mouth at bedtime. (Patient not taking: Reported on 05/17/2014) 30 tablet 0    Musculoskeletal: Strength & Muscle Tone: within normal limits Gait & Station: normal Patient leans: N/A  Psychiatric Specialty Exam:     Blood pressure 112/76, pulse 67, temperature 98.2 F (36.8 C), temperature source Oral, resp. rate 17, SpO2 95 %.There is no weight on file to calculate BMI.  General Appearance: Fairly Groomed  Patent attorney::   Fair  Speech:  Clear and Coherent and Slow  Volume:  Decreased  Mood:  Depressed, Hopeless and Worthless  Affect:  Depressed and Flat  Thought Process:  Coherent and Intact  Orientation:  Full (Time, Place, and Person)  Thought Content:  Hallucinations: Auditory and Rumination  Suicidal Thoughts:  Yes.  with intent/plan  Homicidal Thoughts:  No  Memory:  Immediate;   Good Recent;   Good Remote;   Good  Judgement:  Poor  Insight:  Fair  Psychomotor Activity:  Decreased  Concentration:  Fair  Recall:  Fair  Fund of Knowledge:Good  Language: Good  Akathisia:  No  Handed:  Right  AIMS (if indicated):     Assets:  Communication Skills Desire for Improvement Financial Resources/Insurance Housing  ADL's:  Intact  Cognition: WNL  Sleep:      Medical Decision Making: Review of Psycho-Social Stressors (1), Review or order clinical lab tests (1), Established Problem, Worsening (2), Review or order medicine tests (1) and Review of Medication Regimen & Side Effects (2)  Treatment Plan Summary: Daily contact with patient to assess and evaluate symptoms and progress in treatment and Medication management  Plan:  Recommend psychiatric Inpatient admission when medically cleared.  Disposition: -Seek placement at inpatient psychiatric facility for safety and stabilization -Restart home meds  Alberteen Sam, FNP-BC [facility] 05/18/2014 2:48 PM Patient seen face-to-face for psychiatric evaluation, chart reviewed and case discussed with the physician extender and developed treatment plan. Reviewed the information documented and agree with the treatment plan. Thedore Mins, MD

## 2014-05-18 NOTE — ED Notes (Signed)
Patient resting quietly now. He has been cooperative and has not displayed any destructive behavior towards himself or others all night. Door has remained closed due to patient history, but curtain and blinds have remained open for constant visual on the patient.

## 2014-05-19 ENCOUNTER — Encounter (HOSPITAL_COMMUNITY): Payer: Self-pay | Admitting: Psychiatry

## 2014-05-19 DIAGNOSIS — F333 Major depressive disorder, recurrent, severe with psychotic symptoms: Principal | ICD-10-CM

## 2014-05-19 DIAGNOSIS — F4001 Agoraphobia with panic disorder: Secondary | ICD-10-CM | POA: Diagnosis present

## 2014-05-19 DIAGNOSIS — F410 Panic disorder [episodic paroxysmal anxiety] without agoraphobia: Secondary | ICD-10-CM

## 2014-05-19 DIAGNOSIS — R45851 Suicidal ideations: Secondary | ICD-10-CM

## 2014-05-19 MED ORDER — QUETIAPINE FUMARATE 100 MG PO TABS: 100.0000 mg | ORAL_TABLET | Freq: Every day | ORAL | Status: DC

## 2014-05-19 MED ORDER — CLONAZEPAM 1 MG PO TABS
1.0000 mg | ORAL_TABLET | Freq: Three times a day (TID) | ORAL | Status: DC
  Administered 2014-05-19 – 2014-05-26 (×23): 1 mg via ORAL
  Filled 2014-05-19: qty 1
  Filled 2014-05-19: qty 2
  Filled 2014-05-19 (×20): qty 1

## 2014-05-19 MED ORDER — DOCUSATE SODIUM 100 MG PO CAPS
100.0000 mg | ORAL_CAPSULE | Freq: Two times a day (BID) | ORAL | Status: DC
  Filled 2014-05-19 (×21): qty 1

## 2014-05-19 MED ORDER — PRAZOSIN HCL 1 MG PO CAPS
1.0000 mg | ORAL_CAPSULE | Freq: Every day | ORAL | Status: DC
  Administered 2014-05-19 – 2014-05-26 (×8): 1 mg via ORAL
  Filled 2014-05-19 (×10): qty 1

## 2014-05-19 MED ORDER — NICOTINE 14 MG/24HR TD PT24
14.0000 mg | MEDICATED_PATCH | Freq: Every day | TRANSDERMAL | Status: DC
  Administered 2014-05-19 – 2014-05-20 (×2): 14 mg via TRANSDERMAL
  Filled 2014-05-19 (×5): qty 1

## 2014-05-19 MED ORDER — HYDROXYZINE HCL 25 MG PO TABS
25.0000 mg | ORAL_TABLET | Freq: Once | ORAL | Status: AC
  Administered 2014-05-19: 25 mg via ORAL
  Filled 2014-05-19 (×2): qty 1

## 2014-05-19 MED ORDER — FLUVOXAMINE MALEATE 50 MG PO TABS
50.0000 mg | ORAL_TABLET | Freq: Two times a day (BID) | ORAL | Status: DC
  Administered 2014-05-19 – 2014-05-20 (×3): 50 mg via ORAL
  Filled 2014-05-19 (×6): qty 1

## 2014-05-19 MED ORDER — TRAZODONE HCL 100 MG PO TABS
100.0000 mg | ORAL_TABLET | Freq: Every day | ORAL | Status: DC
  Administered 2014-05-19 – 2014-05-21 (×3): 100 mg via ORAL
  Filled 2014-05-19 (×4): qty 1

## 2014-05-19 NOTE — Progress Notes (Signed)
Patient ID: Phillip SailorsSteven Hendrix, male   DOB: May 31, 1970, 44 y.o.   MRN: [id number] [facility], signed forms, fax sent. Carney Bernatherine C Harrill, LCSW

## 2014-05-19 NOTE — Progress Notes (Addendum)
D: Patient is alert and oriented. Pt's mood and affect is depressed, flat, and blank. Pt's denies HI and AVH. When RN asked if pt is having any suicidal thoughts pt states "of course." Pt denies having a plan. Pt forwards little with RN this morning and remained in bed isolative but was OOB in the afternoon and engaged more with RN and states "Ok I'm going to participate more now," and pt remains in the dayroom the majority of the afternoon. Pt is requesting increased dose of trazadone at night and pt reports having nightmares. Pt complains of anxiety this afternoon with relief from PRN medication. Pt refused 1700pm dose of colace and reports having a bowel movement earlier today. Pt complains of increased anxiety this evening. A: Active listening by RN. Encouragement/Support provided to pt. NP, Charisse MarchLaura D. Made aware of pt's anxiety this evening, new orders acknowledged. PRN medication administered for anxiety per providers orders (See MAR). Scheduled medications administered per providers orders (See MAR). 15 minute checks continued per protocol for patient safety.  R: Pt verbally contracts for safety, agrees not to harm self, and agree's to come to staff with increased intensity of suicidal thoughts. Patient cooperative and receptive to nursing interventions. Pt remains safe.

## 2014-05-19 NOTE — Progress Notes (Signed)
Admission Note:  [age] yr male who presents VC in no acute distress for the treatment of SI and Depression. Pt appears flat and depressed. Pt was calm and cooperative with admission process. Pt presents with active SI and contracts for safety upon admission. Pt +ve AVH  Mainly at night "nightmares". Pt stated he stopped taking his medications, but don't know why. Pt said he has been steadily declining [facility]. Pt lost his job  Skin was assessed and found to be clear of any abnormal marks apart from mutiple tattoos arms, chest, and back. POC and unit policies explained and understanding verbalized. Consents obtained. Food and fluids offered, and  accepted.   Pt had no additional questions or concerns.

## 2014-05-19 NOTE — Tx Team (Signed)
Interdisciplinary Treatment Plan Update (Adult)  Date:  05/19/2014 Time Reviewed:  9:37 AM  Progress in Treatment: Attending groups: No. new admission Participating in groups:  No., new admission Taking medication as prescribed:  No., MD assessing for appropriate medications Tolerating medication:  No. MD assessing for appropriate medications Family/Significant othe contact made:  No, will contact:  collaterals when patient gives consent Patient understands diagnosis:  No., CSW will assess during PSA process Discussing patient identified problems/goals with staff:  No., patient flat affect, contracts for safety on unit. Medical problems stabilized or resolved:  No. MD continuing to assess Denies suicidal/homicidal ideation: No., Patient admitted due to endorsing SI and depression. Issues/concerns per patient self-inventory:  No. Other:  New problem(s) identified: No, Describe:  Recent admission  Discharge Plan or Barriers:  Active suicidal ideation, purchase of gun which patient states is hidden, patient has researched suicide methods including overdose.    Reason for Continuation of Hospitalization:  Suicidal ideation Depression  Comments:  Estimated length of stay:  5 - 7 days  New goal(s): Medication stabilization, decrease in SI, increase in coping skills  Review of initial/current patient goals per problem list:   [age] yr male who presents VC in no acute distress for the treatment of SI and Depression. Pt appears flat and depressed. Pt was calm and cooperative with admission process. Pt presents with active SI and contracts for safety upon admission. Pt +ve AVH Mainly at night "nightmares". Pt stated he stopped taking his medications, but don't know why. Pt said he has been steadily declining since [facility]. Pt lost his job  Patient is reported to be veteran suffering from PTSD, patient denies service connection.  States he is burden to fiance and chlidren, has  purchased gun and hidden outside his residence.  Contracts for safety on unit only.  Is not compliant w outpatient treatment and medications at present - discontinued treatment several weeks ago for unclear reasons.    Current medications:  Gabapentin 600 mg TID, quetiapine 200 mg QHS, trazodone 50mg  QHS.   Attendees: Patient: 2/23/20169:37 AM  Family:   2/23/20169:37 AM  Physician:  Noel ChristmasSarmanna Eappen, MD 2/23/20169:37 AM  Nursing:   Joesph FillersSarah Tywan,RN; Brayton ElBritney, RN, [place]any RN 2/23/20169:37 AM  Case Manager:  Leisa LenzValerie Enoch, [facility] 2/23/20169:37 AM  Social Worker:  Santa GeneraAnne Abagayle Klutts LCSW 2/23/20169:37 AM  Other:  Ronda Fairlyatherine Harrill LCSW 2/23/20169:37 AM  Other:  Belenda CruiseKristin Drinkard LCSW 2/23/20169:37 AM  Other:   2/23/20169:37 AM  Other:  2/23/20169:37 AM  Other:  2/23/20169:37 AM  Other:  2/23/20169:37 AM  Other:  2/23/20169:37 AM  Other:  2/23/20169:37 AM  Other:  2/23/20169:37 AM  Other:   2/23/20169:37 AM   Scribe for Treatment Team:   Sallee Langeunningham, Idriss Quackenbush C, 05/19/2014, 9:37 AM
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
## 2014-05-19 NOTE — [facility] Group Notes (Signed)
[facility] LCSW Group Therapy  05/19/2014 1:15 PM   Type of Therapy: Group Therapy  Participation Level: Minimal  Participation Quality: Attentive  Affect: Depressed and Flat  Cognitive: Oriented  Insight: None shared  Engagement in Therapy: Attentive while present  Modes of Intervention:  Exploration,  Socialization and Support  Summary of Progress/Problems: Patient shared during group warm up reporting there was nothing he was looking forward to within the next 6 months.  The topic for group therapy was feelings about diagnosis. Pt was quietly attentive as others shared their feelings. Pt left at group midpoint.   Carney Bernatherine C Jamila Slatten, LCSW

## 2014-05-19 NOTE — [facility] Group Notes (Signed)
The focus of this group is to educate the patient on the purpose and policies of crisis stabilization and provide a format to answer questions about their admission.  The group details unit policies and expectations of patients while admitted. Patient did not attend this group. 

## 2014-05-19 NOTE — Progress Notes (Signed)
Adult Psychoeducational Group Note  Date:  05/19/2014 Time:  8:42 PM  Group Topic/Focus:  Wrap-Up Group:   The focus of this group is to help patients review their daily goal of treatment and discuss progress on daily workbooks.  Participation Level:  Active  Participation Quality:  Appropriate  Affect:  Appropriate  Cognitive:  Appropriate  Insight: Appropriate  Engagement in Group:  Engaged  Modes of Intervention:  Discussion  Additional Comments:The patient expressed that he did not attend group.  Octavio Mannshigpen, Cortlan Dolin Lee 05/19/2014, 8:42 PM

## 2014-05-19 NOTE — Tx Team (Signed)
Initial Interdisciplinary Treatment Plan   PATIENT STRESSORS: Medication change or noncompliance   PATIENT STRENGTHS: Ability for insight Average or above average intelligence General fund of knowledge Supportive family/friends   PROBLEM LIST: Problem List/Patient Goals Date to be addressed Date deferred Reason deferred Estimated date of resolution  Risk for suicide      Psychosis (AVH)      "get better, Be normal"      "be happy and get rid of all the flashbacks"                                     DISCHARGE CRITERIA:  Improved stabilization in mood, thinking, and/or behavior Motivation to continue treatment in a less acute level of care Verbal commitment to aftercare and medication compliance  PRELIMINARY DISCHARGE PLAN: Attend aftercare/continuing care group Outpatient therapy  PATIENT/FAMIILY INVOLVEMENT: This treatment plan has been presented to and reviewed with the patient, Trey SailorsSteven Welliver.  The patient and family have been given the opportunity to ask questions and make suggestions.  Jacques Navyhillips, Tyress Loden A 05/19/2014, 3:22 AM

## 2014-05-19 NOTE — [facility] Suicide Risk Assessment (Signed)
[facility] Ad[facility] Assessment   Nursing information obtained from:    Demographic factors:    Current Mental Status:    Loss Factors:    Historical Factors:    Risk Reduction Factors:    Total Time spent with patient: 30 minutes Principal Problem: MDD (major depressive disorder), recurrent, severe, with psychosis Diagnosis:   Patient Active Problem List   Diagnosis Date Noted  . Panic disorder [F41.0] 05/19/2014  . MDD (major depressive disorder), recurrent, severe, with psychosis [F33.3] 05/18/2014  . PTSD (post-traumatic stress disorder) [F43.10] 01/29/2014  . Generalized anxiety disorder [F41.1] 06/19/2011    Class: Chronic     Continued Clinical Symptoms:  Alcohol Use Disorder Identification Test Final Score (AUDIT): 0 The "Alcohol Use Disorders Identification Test", Guidelines for Use in Primary Care, Second Edition.  World Science writerHealth Organization [facility](WHO). Score between 0-7:  no or low risk or alcohol related problems. Score between 8-15:  moderate risk of alcohol related problems. Score between 16-19:  high risk of alcohol related problems. Score 20 or above:  warrants further diagnostic evaluation for alcohol dependence and treatment.   CLINICAL FACTORS:   Depression:   Hopelessness Impulsivity Insomnia Previous Psychiatric Diagnoses and Treatments   Musculoskeletal: Strength & Muscle Tone: within normal limits Gait & Station: normal Patient leans: N/A  Psychiatric Specialty Exam: Physical Exam  Review of Systems  Psychiatric/Behavioral: Positive for depression, suicidal ideas and hallucinations. The patient is nervous/anxious and has insomnia.     Blood pressure 113/78, pulse 100, temperature 97.7 F (36.5 C), temperature source Oral, resp. rate 16, height 5\' 10"  (1.778 m), weight 73.936 kg (163 lb).Body mass index is 23.39 kg/(m^2).  General Appearance: Guarded  Eye Contact::  Minimal  Speech:  Normal Rate  Volume:  Decreased  Mood:  Anxious and  Dysphoric  Affect:  Depressed  Thought Process:  Goal Directed  Orientation:  Full (Time, Place, and Person)  Thought Content:  Hallucinations: Auditory Visual, Paranoid Ideation and Rumination  Suicidal Thoughts:  Yes.  without intent/plan  Homicidal Thoughts:  No  Memory:  Immediate;   Fair Recent;   Fair Remote;   Fair  Judgement:  Impaired  Insight:  Lacking  Psychomotor Activity:  Restlessness  Concentration:  Poor  Recall:  Fair  Fund of Knowledge:Fair  Language: Fair  Akathisia:  No  Handed:  Right  AIMS (if indicated):     Assets:  Communication Skills Desire for Improvement  Sleep:     Cognition: WNL  ADL's:  Intact     COGNITIVE FEATURES THAT CONTRIBUTE TO RISK:  Closed-mindedness, Polarized thinking and Thought constriction (tunnel vision)    SUICIDE RISK:   Moderate:  Frequent suicidal ideation with limited intensity, and duration, some specificity in terms of plans, no associated intent, good self-control, limited dysphoria/symptomatology, some risk factors present, and identifiable protective factors, including available and accessible social support.  PLAN OF CARE:Patient will benefit from inpatient treatment and stabilization.  Estimated length of stay is 5-7 days.  Reviewed past medical records,treatment plan.  Will continue to monitor vitals ,medication compliance and treatment side effects while patient is here.  Will monitor for medical issues as well as call consult as needed.  Reviewed labs ,will order as needed.  CSW will start working on disposition.  Patient to participate in therapeutic milieu .       Medical Decision Making:  Review of Psycho-Social Stressors (1), Review or order clinical lab tests (1), Established Problem, Worsening (2), Review or order medicine tests (  1), Review of Medication Regimen & Side Effects (2) and Review of New Medication or Change in Dosage (2)  I certify that inpatient services furnished can reasonably be  expected to improve the patient's condition.   Delany Steury MD 05/19/2014, 1:50 PM

## 2014-05-19 NOTE — H&P (Signed)
Psychiatric Admission Assessment Adult  Patient Identification: Phillip [name] MRN:  [id number] Date of Evaluation:  05/19/2014 Chief Complaint:  "I came here instead of killing myself."  Principal Diagnosis: MDD (major depressive disorder), recurrent, severe, with psychosis Diagnosis:   Patient Active Problem List   Diagnosis Date Noted  . Panic disorder [F41.0] 05/19/2014  . MDD (major depressive disorder), recurrent, severe, with psychosis [F33.3] 05/18/2014  . PTSD (post-traumatic stress disorder) [F43.10] 01/29/2014  . Generalized anxiety disorder [F41.1] 06/19/2011    Class: Chronic   History of Present Illness::   Phillip [name] is an 44 y.o. male, single, Caucasian who presents to [facility] ED accompanied by [employer]. Pt reports he has felt very depressed for the past two months with symptoms including daily crying spells, insomnia, nightmare, deceased appetite, fatigue, decreased concentration and feelings of hopelessness. He reports he is having daily panic attacks. He reports sleeping 1-2 hours per night for the past month. He states he has lost approximately 15 lbs over the past two months and only eats once per day. Pt states he has been perseverating on trauma experienced while serving in the [employer] during [employer]. He reports fear of being around other people. Pt states he is a burden on his children and fiancee and feels his only option is to kill himself. He says he has attempted suicide by overdose twice in the past and through online research realized "it is difficult to kill yourself by overdose." Pt state he purchased a pistol for the purpose of killing himself and that he has hidden the gun outside his home so his children cannot access it. Pt reports he sometimes seeing shadows of people and "I hear a voice inside my head saying that it is okay, just kill yourself." Pt states he lost a good job two months ago following his admission to [facility] for mental health  treatment following a suicide attempt by overdose. He reports living with his fiancee and his three children, ages 108, 57 and 54. He states his medications are prescribed by his primary care physician Ascencion Dike. Pt reports he stopped taking his medications a few weeks ago "because I just didn't feel like taking them." He reports being hospitalized at [facility] and [facility] in the past but does not want to return to [facility] because he had a bad experience there. Patient states during his psychiatric assessment "I feel something fighting in me to stay alive. I did not to hurt my fiance by killing myself. I hope my thinking gets better. Right now my depression is at a ten." The patient is cooperative but appears very depressed throughout the assessment. He denies any intent to harm himself while in the hospital.   Elements:  Location:  Depression, PTSD symptoms. Quality:  Suicide plan by gun. Severity:  Severe. Timing:  Last few months. Duration:  "Started three years ago." . Context:  Stopped taking medications, worsening symptoms of depression, decreased ability to function . Associated Signs/Symptoms: Depression Symptoms:  depressed mood, anhedonia, insomnia, psychomotor retardation, fatigue, feelings of worthlessness/guilt, difficulty concentrating, hopelessness, impaired memory, recurrent thoughts of death, suicidal thoughts with specific plan, anxiety, panic attacks, loss of energy/fatigue, disturbed sleep, weight loss, decreased appetite, (Hypo) Manic Symptoms:  Denies Anxiety Symptoms:  Agoraphobia, Excessive Worry, Panic Symptoms, Psychotic Symptoms:  Hallucinations: Auditory Paranoia, PTSD Symptoms: Had a traumatic exposure:  Reports multiple traumas from his time in the [employer].  Re-experiencing:  Flashbacks Intrusive Thoughts Nightmares Hypervigilance:  Yes Hyperarousal:  Difficulty Concentrating Emotional Numbness/Detachment Avoidance:  Decreased  Interest/Participation Foreshortened Future Total Time spent with patient: 1 hour  Past Medical History:  Past Medical History  Diagnosis Date  . Anxiety   . Depression     Past Surgical History  Procedure Laterality Date  . Cholesterol     Family History:  Family History  Problem Relation Age of Onset  . Cancer Mother   . Cancer Father   . Depression Father    Social History:  History  Alcohol Use No     History  Drug Use  . Yes  . Special: Marijuana    History   Social History  . Marital Status: Married    Spouse Name: N/A  . Number of Children: N/A  . Years of Education: N/A   Social History Main Topics  . Smoking status: Current Every Day Smoker -- 1.00 packs/day for 19 years    Types: Cigarettes  . Smokeless tobacco: Not on file  . Alcohol Use: No  . Drug Use: Yes    Special: Marijuana  . Sexual Activity: Yes    Birth Control/ Protection: None   Other Topics Concern  . None   Social History Narrative   Additional Social History:                          Musculoskeletal: Strength & Muscle Tone: within normal limits Gait & Station: normal Patient leans: N/A  Psychiatric Specialty Exam: Physical Exam  Constitutional:  Physical exam findings reviewed from the [facility] and I concur with no noted exceptions.   Psychiatric: His speech is normal. His mood appears anxious. He is withdrawn. Thought content is paranoid. Cognition and memory are normal. He exhibits a depressed mood. He expresses suicidal ideation.    Review of Systems  Constitutional: Negative.   HENT: Negative.   Eyes: Negative.   Respiratory: Negative.   Cardiovascular: Negative.   Gastrointestinal: Negative.   Genitourinary: Negative.   Musculoskeletal: Negative.   Skin: Negative.   Neurological: Negative.   Endo/Heme/Allergies: Negative.   Psychiatric/Behavioral: Positive for depression, suicidal ideas, hallucinations and substance abuse. Negative for memory loss. The  patient is nervous/anxious and has insomnia.     Blood pressure 113/78, pulse 100, temperature 97.7 F (36.5 C), temperature source Oral, resp. rate 16, height $RemoveBe'5\' 10"'[id number]$  (1.778 m), weight 73.936 kg (163 lb).Body mass index is 23.39 kg/(m^2).  General Appearance: Casual  Eye Contact::  Fair  Speech:  Normal Rate  Volume:  Decreased  Mood:  Anxious and Dysphoric  Affect:  Depressed  Thought Process:  Goal Directed  Orientation:  Full (Time, Place, and Person)  Thought Content:  Hallucinations: Auditory, Paranoid Ideation and Rumination  Suicidal Thoughts:  Yes.  with intent/plan  Homicidal Thoughts:  No  Memory:  Immediate;   Good Recent;   Fair Remote;   Fair  Judgement:  Impaired  Insight:  Lacking  Psychomotor Activity:  Restlessness  Concentration:  Poor  Recall:  [employer] of Knowledge:Fair  Language: Fair  Akathisia:  No  Handed:  Right  AIMS (if indicated):     Assets:  Communication Skills Desire for Improvement  ADL's:  Intact  Cognition: WNL  Sleep:      Risk to Self: Is patient at risk for suicide?: Yes What has been your use of drugs/alcohol within the last 12 months?: Pt reports he uses clonopin which is prescribed and used THC once in last month but he  will not again as he felt paranoid when using Risk to Others:   Prior Inpatient Therapy:   Prior Outpatient Therapy:    Alcohol Screening: 1. How often do you have a drink containing alcohol?: Never 9. Have you or someone else been injured as a result of your drinking?: No 10. Has a relative or friend or a doctor or another health worker been concerned about your drinking or suggested you cut down?: No Alcohol Use Disorder Identification Test Final Score (AUDIT): 0 Brief Intervention: Patient declined brief intervention  Allergies:  No Known Allergies Lab Results:  Results for orders placed or performed during the hospital encounter of 05/17/14 (from the past 48 hour(s))  CBC with Differential     Status: None    Collection Time: 05/17/14  9:31 PM  Result Value Ref Range   WBC 7.4 4.0 - 10.5 K/uL   RBC 5.11 4.22 - 5.81 MIL/uL   Hemoglobin 16.7 13.0 - 17.0 g/dL   HCT 48.5 39.0 - 52.0 %   MCV 94.9 78.0 - 100.0 fL   MCH 32.7 26.0 - 34.0 pg   MCHC 34.4 30.0 - 36.0 g/dL   RDW 12.6 11.5 - 15.5 %   Platelets 260 150 - 400 K/uL   Neutrophils Relative % 53 43 - 77 %   Neutro Abs 3.9 1.7 - 7.7 K/uL   Lymphocytes Relative 38 12 - 46 %   Lymphs Abs 2.9 0.7 - 4.0 K/uL   Monocytes Relative 7 3 - 12 %   Monocytes Absolute 0.5 0.1 - 1.0 K/uL   Eosinophils Relative 2 0 - 5 %   Eosinophils Absolute 0.2 0.0 - 0.7 K/uL   Basophils Relative 0 0 - 1 %   Basophils Absolute 0.0 0.0 - 0.1 K/uL  Comprehensive metabolic panel     Status: Abnormal   Collection Time: 05/17/14  9:31 PM  Result Value Ref Range   Sodium 139 135 - 145 mmol/L   Potassium 3.7 3.5 - 5.1 mmol/L   Chloride 108 96 - 112 mmol/L   CO2 22 19 - 32 mmol/L   Glucose, Bld 204 (H) 70 - 99 mg/dL   BUN 21 6 - 23 mg/dL   Creatinine, Ser 1.10 0.50 - 1.35 mg/dL   Calcium 9.5 8.4 - 10.5 mg/dL   Total Protein 7.7 6.0 - 8.3 g/dL   Albumin 4.6 3.5 - 5.2 g/dL   AST 26 0 - 37 U/L   ALT 41 0 - 53 U/L   Alkaline Phosphatase 81 39 - 117 U/L   Total Bilirubin 0.5 0.3 - 1.2 mg/dL   GFR calc non Af Amer 81 (L) >90 mL/min   GFR calc Af Amer >90 >90 mL/min    Comment: (NOTE) The eGFR has been calculated using the CKD EPI equation. This calculation has not been validated in all clinical situations. eGFR's persistently <90 mL/min signify possible Chronic Kidney Disease.    Anion gap 9 5 - 15  Acetaminophen level     Status: Abnormal   Collection Time: 05/17/14  9:32 PM  Result Value Ref Range   Acetaminophen (Tylenol), Serum <10.0 (L) 10 - 30 ug/mL    Comment:        THERAPEUTIC CONCENTRATIONS VARY SIGNIFICANTLY. A RANGE OF 10-30 ug/mL MAY BE AN EFFECTIVE CONCENTRATION FOR MANY PATIENTS. HOWEVER, SOME ARE BEST TREATED AT CONCENTRATIONS OUTSIDE  THIS RANGE. ACETAMINOPHEN CONCENTRATIONS >150 ug/mL AT 4 HOURS AFTER INGESTION AND >50 ug/mL AT 12 HOURS AFTER INGESTION ARE OFTEN  ASSOCIATED WITH TOXIC REACTIONS.   Salicylate level     Status: None   Collection Time: 05/17/14  9:32 PM  Result Value Ref Range   Salicylate Lvl <5.5 2.8 - 20.0 mg/dL  Ethanol     Status: None   Collection Time: 05/17/14  9:32 PM  Result Value Ref Range   Alcohol, Ethyl (B) <5 0 - 9 mg/dL    Comment:        LOWEST DETECTABLE LIMIT FOR SERUM ALCOHOL IS 11 mg/dL FOR MEDICAL PURPOSES ONLY   Urine rapid drug screen (hosp performed)     Status: Abnormal   Collection Time: 05/18/14  1:59 PM  Result Value Ref Range   Opiates NONE DETECTED NONE DETECTED   Cocaine NONE DETECTED NONE DETECTED   Benzodiazepines POSITIVE (A) NONE DETECTED   Amphetamines NONE DETECTED NONE DETECTED   Tetrahydrocannabinol POSITIVE (A) NONE DETECTED   Barbiturates NONE DETECTED NONE DETECTED    Comment:        DRUG SCREEN FOR MEDICAL PURPOSES ONLY.  IF CONFIRMATION IS NEEDED FOR ANY PURPOSE, NOTIFY LAB WITHIN 5 DAYS.        LOWEST DETECTABLE LIMITS FOR URINE DRUG SCREEN Drug Class       Cutoff (ng/mL) Amphetamine      1000 Barbiturate      200 Benzodiazepine   732 Tricyclics       202 Opiates          300 Cocaine          300 THC              50    Current Medications: Current Facility-Administered Medications  Medication Dose Route Frequency Provider Last Rate Last Dose  . alum & mag hydroxide-simeth (MAALOX/MYLANTA) 200-200-20 MG/5ML suspension 30 mL  30 mL Oral Q4H PRN Lurena Nida, NP      . clonazePAM Bobbye Charleston) tablet 1 mg  1 mg Oral TID AC Ursula Alert, MD   1 mg at 05/19/14 1132  . docusate sodium (COLACE) capsule 100 mg  100 mg Oral BID Ursula Alert, MD      . feeding supplement (ENSURE COMPLETE) (ENSURE COMPLETE) liquid 237 mL  237 mL Oral BID BM Nicholaus Bloom, MD   237 mL at 05/19/14 1132  . fluvoxaMINE (LUVOX) tablet 50 mg  50 mg Oral BID  Ursula Alert, MD   50 mg at 05/19/14 1139  . gabapentin (NEURONTIN) capsule 600 mg  600 mg Oral TID Lurena Nida, NP   600 mg at 05/19/14 1132  . hydrOXYzine (ATARAX/VISTARIL) tablet 25 mg  25 mg Oral Q6H PRN Lurena Nida, NP   25 mg at 05/19/14 1435  . ibuprofen (ADVIL,MOTRIN) tablet 600 mg  600 mg Oral Q6H PRN Laverle Hobby, PA-C   600 mg at 05/18/14 2348  . magnesium hydroxide (MILK OF MAGNESIA) suspension 30 mL  30 mL Oral Daily PRN Lurena Nida, NP      . nicotine (NICODERM CQ - dosed in mg/24 hours) patch 14 mg  14 mg Transdermal Daily Nicholaus Bloom, MD   14 mg at 05/19/14 1132  . prazosin (MINIPRESS) capsule 1 mg  1 mg Oral QHS Saramma Eappen, MD      . QUEtiapine (SEROQUEL) tablet 200 mg  200 mg Oral QHS Laverle Hobby, PA-C   200 mg at 05/18/14 2348  . traZODone (DESYREL) tablet 100 mg  100 mg Oral QHS Ursula Alert, MD  PTA Medications: Prescriptions prior to admission  Medication Sig Dispense Refill Last Dose  . clonazePAM (KLONOPIN) 1 MG tablet Take 1 tablet (1 mg total) by mouth 3 (three) times daily before meals. 15 tablet 1 Past Week at Unknown time  . gabapentin (NEURONTIN) 300 MG capsule Take 2 capsules (600 mg total) by mouth 4 (four) times daily. 240 capsule 0 05/18/2014 at Unknown time  . hydrOXYzine (ATARAX/VISTARIL) 25 MG tablet Take 1 tablet (25 mg total) by mouth every 6 (six) hours as needed for anxiety. 30 tablet 0 05/18/2014 at Unknown time  . nicotine (NICODERM CQ - DOSED IN MG/24 HOURS) 21 mg/24hr patch Place 1 patch (21 mg total) onto the skin daily. 7 patch 0 05/18/2014 at Unknown time  . Pseudoeph-Doxylamine-DM-APAP (NYQUIL PO) Take 30 mLs by mouth at bedtime as needed (sleep).   Past Week at Unknown time  . QUEtiapine (SEROQUEL) 400 MG tablet Take 1 tablet (400 mg total) by mouth at bedtime. 30 tablet 0 05/17/2014 at Unknown time  . fluvoxaMINE (LUVOX) 50 MG tablet Take 1 tablet (50 mg total) by mouth 2 (two) times daily. (Patient not taking: Reported on  05/17/2014) 60 tablet 0 More than a month at Unknown time  . pantoprazole (PROTONIX) 40 MG tablet Take 1 tablet (40 mg total) by mouth daily. (Patient not taking: Reported on 05/17/2014) 30 tablet 0 More than a month at Unknown time  . traZODone (DESYREL) 100 MG tablet Take 100 mg by mouth at bedtime.  1     Previous Psychotropic Medications: Yes  "I have tried all SSRI's with no effect." Also Risperdal, Seroquel, Ativan, Zoloft, Celexa, Lexapro, Valium, Zyprexa  Substance Abuse History in the last 12 months:  Yes.   Patient reports occasional use of marijuana. Admits to last use being a few weeks with current UDS positive for marijuana   Consequences of Substance Abuse: Possible worsening of mental health symptoms.   Results for orders placed or performed during the hospital encounter of 05/17/14 (from the past 72 hour(s))  CBC with Differential     Status: None   Collection Time: 05/17/14  9:31 PM  Result Value Ref Range   WBC 7.4 4.0 - 10.5 K/uL   RBC 5.11 4.22 - 5.81 MIL/uL   Hemoglobin 16.7 13.0 - 17.0 g/dL   HCT 48.5 39.0 - 52.0 %   MCV 94.9 78.0 - 100.0 fL   MCH 32.7 26.0 - 34.0 pg   MCHC 34.4 30.0 - 36.0 g/dL   RDW 12.6 11.5 - 15.5 %   Platelets 260 150 - 400 K/uL   Neutrophils Relative % 53 43 - 77 %   Neutro Abs 3.9 1.7 - 7.7 K/uL   Lymphocytes Relative 38 12 - 46 %   Lymphs Abs 2.9 0.7 - 4.0 K/uL   Monocytes Relative 7 3 - 12 %   Monocytes Absolute 0.5 0.1 - 1.0 K/uL   Eosinophils Relative 2 0 - 5 %   Eosinophils Absolute 0.2 0.0 - 0.7 K/uL   Basophils Relative 0 0 - 1 %   Basophils Absolute 0.0 0.0 - 0.1 K/uL  Comprehensive metabolic panel     Status: Abnormal   Collection Time: 05/17/14  9:31 PM  Result Value Ref Range   Sodium 139 135 - 145 mmol/L   Potassium 3.7 3.5 - 5.1 mmol/L   Chloride 108 96 - 112 mmol/L   CO2 22 19 - 32 mmol/L   Glucose, Bld 204 (H) 70 - 99 mg/dL  BUN 21 6 - 23 mg/dL   Creatinine, Ser 1.10 0.50 - 1.35 mg/dL   Calcium 9.5 8.4 - 10.5  mg/dL   Total Protein 7.7 6.0 - 8.3 g/dL   Albumin 4.6 3.5 - 5.2 g/dL   AST 26 0 - 37 U/L   ALT 41 0 - 53 U/L   Alkaline Phosphatase 81 39 - 117 U/L   Total Bilirubin 0.5 0.3 - 1.2 mg/dL   GFR calc non Af Amer 81 (L) >90 mL/min   GFR calc Af Amer >90 >90 mL/min    Comment: (NOTE) The eGFR has been calculated using the CKD EPI equation. This calculation has not been validated in all clinical situations. eGFR's persistently <90 mL/min signify possible Chronic Kidney Disease.    Anion gap 9 5 - 15  Acetaminophen level     Status: Abnormal   Collection Time: 05/17/14  9:32 PM  Result Value Ref Range   Acetaminophen (Tylenol), Serum <10.0 (L) 10 - 30 ug/mL    Comment:        THERAPEUTIC CONCENTRATIONS VARY SIGNIFICANTLY. A RANGE OF 10-30 ug/mL MAY BE AN EFFECTIVE CONCENTRATION FOR MANY PATIENTS. HOWEVER, SOME ARE BEST TREATED AT CONCENTRATIONS OUTSIDE THIS RANGE. ACETAMINOPHEN CONCENTRATIONS >150 ug/mL AT 4 HOURS AFTER INGESTION AND >50 ug/mL AT 12 HOURS AFTER INGESTION ARE OFTEN ASSOCIATED WITH TOXIC REACTIONS.   Salicylate level     Status: None   Collection Time: 05/17/14  9:32 PM  Result Value Ref Range   Salicylate Lvl <2.4 2.8 - 20.0 mg/dL  Ethanol     Status: None   Collection Time: 05/17/14  9:32 PM  Result Value Ref Range   Alcohol, Ethyl (B) <5 0 - 9 mg/dL    Comment:        LOWEST DETECTABLE LIMIT FOR SERUM ALCOHOL IS 11 mg/dL FOR MEDICAL PURPOSES ONLY   Urine rapid drug screen (hosp performed)     Status: Abnormal   Collection Time: 05/18/14  1:59 PM  Result Value Ref Range   Opiates NONE DETECTED NONE DETECTED   Cocaine NONE DETECTED NONE DETECTED   Benzodiazepines POSITIVE (A) NONE DETECTED   Amphetamines NONE DETECTED NONE DETECTED   Tetrahydrocannabinol POSITIVE (A) NONE DETECTED   Barbiturates NONE DETECTED NONE DETECTED    Comment:        DRUG SCREEN FOR MEDICAL PURPOSES ONLY.  IF CONFIRMATION IS NEEDED FOR ANY PURPOSE, NOTIFY LAB WITHIN 5  DAYS.        LOWEST DETECTABLE LIMITS FOR URINE DRUG SCREEN Drug Class       Cutoff (ng/mL) Amphetamine      1000 Barbiturate      200 Benzodiazepine   268 Tricyclics       341 Opiates          300 Cocaine          300 THC              50     Observation Level/Precautions:  15 minute checks  Laboratory:  CBC Chemistry Profile UDS  Psychotherapy: Individual and Group Therapy  Medications:  Start Minipress 1 mg at hs for nightmares   Consultations:  As needed  Discharge Concerns:  Safety and Stability   Estimated LOS: 3-5 days   Other:     Psychological Evaluations: Yes   Treatment Plan Summary: Daily contact with patient to assess and evaluate symptoms and progress in treatment and Medication management  Treatment Plan/Recommendations:   1. Admit for crisis management  and stabilization. Estimated length of stay 5-7 days. 2. Medication management to reduce current symptoms to base line and improve the patient's level of functioning.  3. Develop treatment plan to decrease risk of relapse upon discharge of depressive symptoms and the need for readmission. 5. Group therapy to facilitate development of healthy coping skills to use for depression and anxiety. 6. Health care follow up as needed for medical problems.  7. Discharge plan to include therapy to help patient cope with stressors.  8. Call for Consult with Hospitalist for additional specialty patient services as needed.   Medical Decision Making:  Review of Psycho-Social Stressors (1), Review and summation of old records (2), Established Problem, Worsening (2), Review of Last Therapy Session (1), Review of Medication Regimen & Side Effects (2) and Review of New Medication or Change in Dosage (2)  I certify that inpatient services furnished can reasonably be expected to improve the patient's condition.   Elmarie Shiley NP-C 2/23/20163:52 PM

## 2014-05-20 MED ORDER — QUETIAPINE FUMARATE 400 MG PO TABS
400.0000 mg | ORAL_TABLET | Freq: Every day | ORAL | Status: DC
  Administered 2014-05-20: 400 mg via ORAL
  Filled 2014-05-20 (×2): qty 1

## 2014-05-20 MED ORDER — BENZTROPINE MESYLATE 0.5 MG PO TABS
0.5000 mg | ORAL_TABLET | Freq: Every day | ORAL | Status: DC
  Administered 2014-05-20 – 2014-05-21 (×2): 0.5 mg via ORAL
  Filled 2014-05-20 (×6): qty 1

## 2014-05-20 MED ORDER — HALOPERIDOL 5 MG PO TABS
5.0000 mg | ORAL_TABLET | Freq: Once | ORAL | Status: AC
  Administered 2014-05-20: 5 mg via ORAL
  Filled 2014-05-20 (×2): qty 1

## 2014-05-20 MED ORDER — FLUVOXAMINE MALEATE 50 MG PO TABS
50.0000 mg | ORAL_TABLET | Freq: Every day | ORAL | Status: DC
  Administered 2014-05-21: 50 mg via ORAL
  Filled 2014-05-20 (×2): qty 1

## 2014-05-20 MED ORDER — FLUVOXAMINE MALEATE 50 MG PO TABS
75.0000 mg | ORAL_TABLET | Freq: Every day | ORAL | Status: DC
Start: 2014-05-20 — End: 2014-05-21
  Administered 2014-05-20: 75 mg via ORAL
  Filled 2014-05-20 (×3): qty 2

## 2014-05-20 NOTE — Progress Notes (Signed)
D: Patient reports increase auditory hallucination with voices telling him to hurt himself. "I don't think I can take these voices anymore".  Pt initially will not contract stating "I just want this to go away". Pt requested haldol and a meal. Pt mood and affect is depressed and flat. Cooperative with assessment.   A: Met with pt 1:1. Medications administered as prescribed. Writer encouraged pt to discuss feelings. Pt encouraged to come to staff with any question or concerns.   R: Patient is safe. Pt reports the he" trust the voices now and will not hurts himself". Pt then contracted to come to staff if feeling unsafe. He is complaint with medications and denies any adverse reaction.
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
## 2014-05-20 NOTE — Progress Notes (Signed)
D:  Pt +ve SI-contracts for safety. Pt denies HI/AVH. Pt is pleasant and cooperative. Pt stated he woke up and was having a bad day, but then it got better. Pt continues to have flat depressed affect , and would like you to feel sorry for him, but pt is very nice an  A: Pt was offered support and encouragement. Pt was given scheduled medications. Pt was encourage to attend groups. Q 15 minute checks were done for safety.    R:Pt attends groups and interacts well with peers and staff. Pt is taking medication. Pt has no complaints at this time.Pt receptive to treatment and safety maintained on unit.
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
## 2014-05-20 NOTE — Progress Notes (Signed)
Nutrition Brief Note  RD consulted for diet education related to hyperlipidemia.  Lipid Panel     Component Value Date/Time   CHOL 228* 01/29/2014 0628   TRIG 290* 01/29/2014 0628   HDL 38* 01/29/2014 0628   CHOLHDL 6.0 01/29/2014 0628   VLDL 58* 01/29/2014 0628   LDLCALC 132* 01/29/2014 [id number]   RD spoke with RN regarding need for education d/t pt not having a recent lipid panel drawn for this admission. Last Lipid panel from November 2015 which shows hyperlipidemia.   RD to follow-up with patient for education needs and recent weight loss at a later date. Pt estimated LOS 3-5 days.  Tilda FrancoLindsey Ata Pecha, MS, RD, LDN Pager: [phone] After Hours Pager: [phone]

## 2014-05-20 NOTE — Progress Notes (Signed)
Adult Psychoeducational Group Note  Date:  05/20/2014 Time:  11:19 PM   Participation Level:  Did Not Attend  Additional Comments: Pt did not attend tonight's AA group.  Arrie AranChurch, Shanelle Clontz J 05/20/2014, 11:19 PM

## 2014-05-20 NOTE — B[facility] Group Notes (Signed)
[facility] LCSW Aftercare Discharge Planning Group Note   05/20/2014  9:30  AM  Participation Quality:  Minimal in group; CSW met with patient one to one following group  Mood/Affect:  Depressed  Depression Rating:  10  Anxiety Rating:  8  Thoughts of Suicide:  Yes Will you contract for safety?   Yes, but only while on unit. Patient continues to state plan to "say what I have to to get out of here and finish my plan (suicide by firearm which pt purchased since last admit and will not disclose whereabouts of)"  Current AVH:  Yes While meeting with CSW one to one patient shared concern that he is hearing voices telling him to "say what I have to to get out of here and finish my plan" (suicide by firearm which pt purchased since last admit and will not disclose whereabouts of).   Plan for Discharge/Comments:  When asked if he had been in touch with fiancee patient reports that "I don't want her to come and see me like this"  Transportation Means: Phillip [name]  Supports: Phillip [name], Phillip [name]

## 2014-05-21 DIAGNOSIS — F411 Generalized anxiety disorder: Secondary | ICD-10-CM

## 2014-05-21 MED ORDER — QUETIAPINE FUMARATE 200 MG PO TABS
200.0000 mg | ORAL_TABLET | Freq: Every day | ORAL | Status: DC
  Administered 2014-05-21: 200 mg via ORAL
  Filled 2014-05-21 (×2): qty 1

## 2014-05-21 MED ORDER — BENZTROPINE MESYLATE 0.5 MG PO TABS
0.5000 mg | ORAL_TABLET | ORAL | Status: DC
  Administered 2014-05-21 – 2014-05-22 (×2): 0.5 mg via ORAL
  Filled 2014-05-21 (×4): qty 1

## 2014-05-21 MED ORDER — HALOPERIDOL 0.5 MG PO TABS
2.5000 mg | ORAL_TABLET | ORAL | Status: DC
  Administered 2014-05-21 – 2014-05-22 (×3): 2.5 mg via ORAL
  Filled 2014-05-21 (×6): qty 1

## 2014-05-21 MED ORDER — NICOTINE 21 MG/24HR TD PT24
21.0000 mg | MEDICATED_PATCH | Freq: Every day | TRANSDERMAL | Status: DC
  Filled 2014-05-21: qty 1

## 2014-05-21 MED ORDER — CALCIUM CARBONATE ANTACID 500 MG PO CHEW
1.0000 | CHEWABLE_TABLET | Freq: Three times a day (TID) | ORAL | Status: DC | PRN
  Administered 2014-05-21: 200 mg via ORAL
  Filled 2014-05-21 (×2): qty 1

## 2014-05-21 MED ORDER — NICOTINE 21 MG/24HR TD PT24
21.0000 mg | MEDICATED_PATCH | Freq: Every day | TRANSDERMAL | Status: DC
  Administered 2014-05-21 – 2014-05-27 (×7): 21 mg via TRANSDERMAL
  Filled 2014-05-21 (×9): qty 1

## 2014-05-21 MED ORDER — FLUVOXAMINE MALEATE 50 MG PO TABS
75.0000 mg | ORAL_TABLET | Freq: Two times a day (BID) | ORAL | Status: DC
  Administered 2014-05-21 – 2014-05-25 (×7): 75 mg via ORAL
  Filled 2014-05-21 (×14): qty 2

## 2014-05-21 MED ORDER — FAMOTIDINE 20 MG PO TABS
20.0000 mg | ORAL_TABLET | Freq: Two times a day (BID) | ORAL | Status: DC
  Administered 2014-05-21 – 2014-05-27 (×12): 20 mg via ORAL
  Filled 2014-05-21 (×18): qty 1

## 2014-05-21 NOTE — Progress Notes (Signed)
[facility] MD Progress Note  05/21/2014 1:33 PM Phillip SailorsSteven [name]  MRN:  [id number] Subjective: Patient states "I am still depressed. I feel the Haldol helped me last night with my voices. Can you give me more Haldol. I do not think seroquel is helping anymore."   Objective;Patient seen and chart reviewed.Patient discussed with treatment team. Patient today continues to be withdrawn, isolative , internally preoccupied , continues to feel as though the whole world is against him and that does not deserve to be alive. Patient continues to hear AH asking him to kill self. Patient per EHR has a pistol hidden somewhere ,which he plans on getting after being DC ed from here. Patient continues to want to kill self . Patient did not see his fiance who visited him last night since he feels she does not deserve to be like this . Patient wants to break up with her ,so that she can get away from all this.  Discussed case with CSW - [facility] Patient to be IVCed since he continues to be a danger to self and feels that no one can help him.  Principal Problem: MDD (major depressive disorder), recurrent, severe, with psychosis Diagnosis:   Primary Psychiatric Diagnosis: MDD ,recurrent ,severe with psychosis   Secondary Psychiatric Diagnosis: PTSD Generalized anxiety disorder Cannabis use disorder,moderate  Non Psychiatric Diagnosis:  Patient Active Problem List   Diagnosis Date Noted  . Panic disorder [F41.0] 05/19/2014  . MDD (major depressive disorder), recurrent, severe, with psychosis [F33.3] 05/18/2014  . PTSD (post-traumatic stress disorder) [F43.10] 01/29/2014  . Generalized anxiety disorder [F41.1] 06/19/2011    Class: Chronic   Total Time spent with patient: 30 minutes   Past Medical History:  Past Medical History  Diagnosis Date  . Anxiety   . Depression     Past Surgical History  Procedure Laterality Date  . Cholesterol     Family History:  Family History  Problem  Relation Age of Onset  . Cancer Mother   . Cancer Father   . Depression Father    Social History:  History  Alcohol Use No     History  Drug Use  . Yes  . Special: Marijuana    History   Social History  . Marital Status: Married    Spouse Name: N/A  . Number of Children: N/A  . Years of Education: N/A   Social History Main Topics  . Smoking status: Current Every Day Smoker -- 1.00 packs/day for 19 years    Types: Cigarettes  . Smokeless tobacco: Not on file  . Alcohol Use: No  . Drug Use: Yes    Special: Marijuana  . Sexual Activity: Yes    Birth Control/ Protection: None   Other Topics Concern  . None   Social History Narrative   Additional History:    Sleep: Fair  Appetite:  Fair     Musculoskeletal: Strength & Muscle Tone: within normal limits Gait & Station: normal Patient leans: N/A   Psychiatric Specialty Exam: Physical Exam  Review of Systems  Psychiatric/Behavioral: Positive for depression, suicidal ideas and hallucinations. The patient is nervous/anxious.     Blood pressure 133/78, pulse 82, temperature 97.5 F (36.4 C), temperature source Oral, resp. rate 16, height 5\' 10"  (1.778 m), weight 73.936 kg (163 lb).Body mass index is 23.39 kg/(m^2).  General Appearance: Disheveled  Eye Contact::  Minimal  Speech:  Slow  Volume:  Normal  Mood:  Anxious, Depressed and Irritable  Affect:  Restricted  Thought Process:  Disorganized and Irrelevant  Orientation:  Full (Time, Place, and Person)  Thought Content:  Delusions and Hallucinations: Auditory Visual  Suicidal Thoughts:  Yes.  with intent/plan contracts for safety  Homicidal Thoughts:  No  Memory:  Immediate;   Fair Recent;   Fair Remote;   Fair  Judgement:  Impaired  Insight:  Lacking  Psychomotor Activity:  Restlessness  Concentration:  Poor  Recall:  [employer] of Knowledge:Fair  Language: Fair  Akathisia:  No  Handed:  Right  AIMS (if indicated):     Assets:  Communication  Skills  ADL's:  Intact  Cognition: WNL  Sleep:  Number of Hours: 6.75     Current Medications: Current Facility-Administered Medications  Medication Dose Route Frequency Provider Last Rate Last Dose  . alum & mag hydroxide-simeth (MAALOX/MYLANTA) 200-200-20 MG/5ML suspension 30 mL  30 mL Oral Q4H PRN Kristeen Mans, NP   30 mL at 05/21/14 1152  . benztropine (COGENTIN) tablet 0.5 mg  0.5 mg Oral Daily Kerry Hough, PA-C   0.5 mg at 05/21/14 1610  . haloperidol (HALDOL) tablet 2.5 mg  2.5 mg Oral [id number] Buck Mcaffee, MD   2.5 mg at 05/21/14 1152   And  . benztropine (COGENTIN) tablet 0.5 mg  0.5 mg Oral BH-qamhs Janett Kamath, MD      . clonazePAM (KLONOPIN) tablet 1 mg  1 mg Oral TID AC Jomarie Longs, MD   1 mg at 05/21/14 1152  . docusate sodium (COLACE) capsule 100 mg  100 mg Oral BID Jomarie Longs, MD   100 mg at 05/19/14 1654  . feeding supplement (ENSURE COMPLETE) (ENSURE COMPLETE) liquid 237 mL  237 mL Oral BID BM Rachael Fee, MD   237 mL at 05/21/14 1322  . fluvoxaMINE (LUVOX) tablet 75 mg  75 mg Oral BID Jomarie Longs, MD      . gabapentin (NEURONTIN) capsule 600 mg  600 mg Oral TID Kristeen Mans, NP   600 mg at 05/21/14 1152  . hydrOXYzine (ATARAX/VISTARIL) tablet 25 mg  25 mg Oral Q6H PRN Kristeen Mans, NP   25 mg at 05/21/14 1320  . ibuprofen (ADVIL,MOTRIN) tablet 600 mg  600 mg Oral Q6H PRN Kerry Hough, PA-C   600 mg at 05/21/14 1322  . magnesium hydroxide (MILK OF MAGNESIA) suspension 30 mL  30 mL Oral Daily PRN Kristeen Mans, NP      . nicotine (NICODERM CQ - dosed in mg/24 hours) patch 21 mg  21 mg Transdermal Daily Rachael Fee, MD   21 mg at 05/21/14 0830  . prazosin (MINIPRESS) capsule 1 mg  1 mg Oral QHS Jomarie Longs, MD   1 mg at 05/20/14 2141  . QUEtiapine (SEROQUEL) tablet 200 mg  200 mg Oral QHS Tani Virgo, MD      . traZODone (DESYREL) tablet 100 mg  100 mg Oral QHS Jomarie Longs, MD   100 mg at 05/20/14 2141    Lab Results:  No results  found for this or any previous visit (from the past 48 hour(s)).  Physical Findings: AIMS: Facial and Oral Movements Muscles of Facial Expression: None, normal Lips and Perioral Area: None, normal Jaw: None, normal Tongue: None, normal,Extremity Movements Upper (arms, wrists, hands, fingers): None, normal Lower (legs, knees, ankles, toes): None, normal, Trunk Movements Neck, shoulders, hips: None, normal, Overall Severity Incapacitation due to abnormal movements: None, normal Patient's awareness of abnormal movements (rate only patient's report): No  Awareness, Dental Status Current problems with teeth and/or dentures?: No Does patient usually wear dentures?: No  CIWA:  CIWA-Ar Total: 0 COWS:  COWS Total Score: 0  Assessment: Patient is a 44 year old CM with severe anxiety ,depression as well as psychosis, continues to be delusional and suicidal with plan to use a firearm to kill self.Patient has a pistol hidden somewhere , which he plans on using ,as soon as he gets out of here. Patient will not give information about where it is hidden. Patient to be IVCed . Patient to be [facility].   Treatment Plan Summary: Daily contact with patient to assess and evaluate symptoms and progress in treatment and Medication management  Will continue Luvox , will increase the dose to 150 mg po daily. Will cross taper  Seroquel with Haldol. Patient would prefer to be on Haldol. Will continue Prazosin 1 mg po qhs for nightmares. Will continue Trazodone 100 mg po qhs for sleep. Will continue Klonopin as prescribed. Discussed with patient the risk, SE of being on BZD as well as the need to taper it off. Will monitor for medical issues as well as call consult as needed.  Reviewed labs ,will order as needed.  CSW will start working on disposition. Patient to be [facility] term [facility] .  Patient to participate in therapeutic milieu .       Medical Decision Making:   Review of Psycho-Social Stressors (1), Established Problem, Worsening (2), Review of Last Therapy Session (1), Review of Medication Regimen & Side Effects (2) and Review of New Medication or Change in Dosage (2)     Devlyn Retter MD 05/21/2014, 1:33 PM

## 2014-05-21 NOTE — Clinical Social Work Note (Signed)
Per Junious Dresseronnie at [facility],patient is on wait list.  Phillip GeneraAnne Lindon Kiel, LCSW Clinical Social Worker

## 2014-05-21 NOTE — Clinical Social Work Note (Signed)
[facility] from [place] -7day auth given #[id number].  [facility], CSW spoke w admitting.  Santa GeneraAnne Pearle Wandler, LCSW Clinical Social Worker

## 2014-05-21 NOTE — B[facility] Group Notes (Signed)
[facility] Group Notes:  (Nursing/MHT/Case Management/Adjunct)  Date:  05/21/2014  Time:  0900  Type of Therapy:  Nurse Education  Participation Level:  Did Not Attend  Participation Quality:    Affect:   Cognitive:    Insight:    Engagement in Group:    Modes of Intervention:    Summary of Progress/Problems: Goals/ Leisure and Lifestyle changes  Andres Egeritchett, Nachmen Mansel Hundley 05/21/2014, 10:22 AM

## 2014-05-21 NOTE — Progress Notes (Signed)
Psychoeducational Group Note  Date:  05/21/2014 Time:  2228  Group Topic/Focus:  Wrap-Up Group:   The focus of this group is to help patients review their daily goal of treatment and discuss progress on daily workbooks.  Participation Level: Did Not Attend  Participation Quality:  Not Applicable  Affect:  Not Applicable  Cognitive:  Not Applicable  Insight:  Not Applicable  Engagement in Group: Not Applicable  Additional Comments:  The patient did not attend group this evening since he was asleep.   Hazle CocaGOODMAN, Osvaldo Lamping S 05/21/2014, 10:28 PM

## 2014-05-21 NOTE — Progress Notes (Signed)
Patient ID: Phillip SailorsSteven [name], male   DOB: 06/07/1970, 44 y.o.   MRN: [id number]  D: Patient has a flat affect on approach today. Reports voices better at this time. Patient placed on Haldol regularly. EKG obtained today. No SI at present today. Taking meds without difficulty.Contracts for safety. A: Staff will monitor on q 15 minute checks, follow treatment plan, and give meds as ordered. R: Cooperative on unit.

## 2014-05-21 NOTE — Progress Notes (Signed)
Recreation Therapy Notes  Animal-Assisted Activity/Therapy (AAA/T) Program Checklist/Progress Notes Patient Eligibility Criteria Checklist & Daily Group note for Rec Tx Intervention  Date:  02.25.2016 Time: 2:45pm [facility] [address] [facility]    AAA/T Program Assumption of Risk Form signed by Patient/ or Parent Legal Guardian yes  Patient is free of allergies or sever asthma yes  Patient reports no fear of animals yes  Patient reports no history of cruelty to animals yes  Patient understands his/her participation is voluntary yes  Patient washes hands before animal contact yes  Patient washes hands after animal contact yes  Behavioral Response: Appropriate   Education: Hand Washing, Appropriate Animal Interaction   Education Outcome: Acknowledges education.   Clinical Observations/Feedback: Patient appropriately engaged with therapy dog, handler and peers during session.   Marykay Lexenise L Anistyn Graddy, LRT/CTRS  Jearl KlinefelterBlanchfield, Delia Slatten L 05/21/2014 4:37 PM

## 2014-05-21 NOTE — Clinical Social Work Note (Signed)
[facility] give [facility] on [facility] for [facility].  Santa GeneraAnne Amay Mijangos, LCSW Clinical Social Worker

## 2014-05-21 NOTE — [facility] Group Notes (Signed)
[facility] LCSW Group Therapy  [facility] 1:15 - 2:30 PM  05/21/2014  3:08 PM   Type of Therapy:  Group Therapy  Participation Level: Active  Participation Quality:  Attentive  Affect:  Appropriate  Cognitive:  Appropriate  Insight:  Developing/Improving   Engagement in Therapy:  Developing/Improving   Modes of Intervention:  Discussion, Education, Exploration, Problem-Solving, Rapport Building, Support   Summary of Progress/Problems:   Patient was attentive to speaker from the [employer] [facility] [employer] as he shared his story of dealing with mental health/substance abuse issues and overcoming it by working a recovery program.  Patient asked questions and expressed interest in their programs and services and received information on their agency.    Wynn BankerHodnett, Jack Mineau Hairston 05/21/2014 3:08 PM

## 2014-05-21 NOTE — Clinical Social Work Note (Signed)
CSW met with patient to discuss his discharge plans and progress. Patient reports that he continues to hear voices telling him to kill himself and that he plans to leave the hospital to shoot himself with a gun that he bought. Patient reports that he was doing well in November of 2015 following his last hospitalization and felt that his medications were effective at that time. He then lost his job and was unable to find employment. He then stopped taking his medications. Patient reports that he lacks supports and plans to break up with his girlfriend today as he does not want anyone close to him when he kills himself. Patient stated " When can I leave? I'm wasting your time and my time and I don't want to take a bed away from someone who may really need it. I'm just ready to get it over with." Patient contracts for safety at this time stating "I wouldn't do it here." CSW provided emotional support to patient. [place] referral made per psychiatrist request.   Tilden Fossa, MSW, [place] Worker [facility] [phone]

## 2014-05-21 NOTE — [facility] Suicide Risk Assessment (Signed)
[facility] INPATIENT:  Family/Significant Other Suicide Prevention Education  Suicide Prevention Education:  Contact Attempts: Patient's Ruthell Rummagefiancee, Amanda Reece, at [phone] (home) and [phone] (work)  has been identified by the patient as the family member/significant other with whom the patient will be residing, and identified as the person(s) who will aid the patient in the event of a mental health crisis.  With written consent from the patient, two attempts were made to provide suicide prevention education, prior to and/or following the patient's discharge.  We were unsuccessful in providing suicide prevention education.  A suicide education pamphlet was given to the patient to share with family/significant other.  Date and time of first attempt: 05/19/2014 at 2:30 PM to home number; no answer, no voice mail Date and time of second attempt: 05/21/2014 at 10:15 AM to work number additionally provided by patient, left message requesting call back Third attempt successful see note of 05/22/2014    Phillip [name], Phillip [name] 05/21/2014, 10:14 AM

## 2014-05-22 LAB — GABAPENTIN LEVEL: Gabapentin Lvl: 3.6 ug/mL

## 2014-05-22 MED ORDER — QUETIAPINE FUMARATE 50 MG PO TABS
50.0000 mg | ORAL_TABLET | Freq: Every day | ORAL | Status: AC
  Administered 2014-05-22: 50 mg via ORAL
  Filled 2014-05-22: qty 1

## 2014-05-22 MED ORDER — BENZTROPINE MESYLATE 0.5 MG PO TABS
0.5000 mg | ORAL_TABLET | ORAL | Status: DC
  Administered 2014-05-22 – 2014-05-27 (×16): 0.5 mg via ORAL
  Filled 2014-05-22: qty 42
  Filled 2014-05-22 (×3): qty 1
  Filled 2014-05-22: qty 42
  Filled 2014-05-22 (×17): qty 1
  Filled 2014-05-22: qty 42
  Filled 2014-05-22: qty 1

## 2014-05-22 MED ORDER — TRAZODONE HCL 150 MG PO TABS
150.0000 mg | ORAL_TABLET | Freq: Every day | ORAL | Status: DC
  Administered 2014-05-22 – 2014-05-26 (×5): 150 mg via ORAL
  Filled 2014-05-22 (×4): qty 1
  Filled 2014-05-22: qty 14
  Filled 2014-05-22 (×3): qty 1

## 2014-05-22 MED ORDER — HALOPERIDOL 5 MG PO TABS
5.0000 mg | ORAL_TABLET | ORAL | Status: DC
  Administered 2014-05-22 – 2014-05-27 (×16): 5 mg via ORAL
  Filled 2014-05-22: qty 42
  Filled 2014-05-22 (×10): qty 1
  Filled 2014-05-22: qty 42
  Filled 2014-05-22 (×8): qty 1
  Filled 2014-05-22: qty 42
  Filled 2014-05-22 (×3): qty 1

## 2014-05-22 NOTE — Progress Notes (Signed)
[name], male   DOB: Aug 18, 1970, 44 y.o.   MRN: [id number]  D: Patient upset when speaking to him this am. Reports that the doctor told him that he may be going [facility]. Explained that this is a short term hospital and if they feel he needs long term treatment they may send him there. Patient continues to endorse auditory hallucinations with SI. Continues to not tell where he hid the gun. Patient manipulative and needy at times. Medication seeking his anxiety medications.  A: Staff will monitor on q 15 minute checks, follow treatment plan, and give medications as ordered. R: Attention seeking this am and ruminating over possibly being sent to state hospital.

## 2014-05-22 NOTE — Progress Notes (Addendum)
D)  Was in his room sleeping at the beginning of the shift.  [place] slept through, but did get up when peers returned to the unit.  Went to the dayroom and had a snack, came to the med window later for hs meds. Affect is flat, sad, but eye contact is good.   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  Stated his girlfriend had been encouraging him to go, wants him to go back to school,stated he has a lot of benefits available to him, but not sure they can help him.  States the meds seem to be helping a little but still has suicidal thoughts.  Has been compliant with meds, hoping the minipress will help with nightmares. A)  Will continue to monitor q 15 minutes for safety, continue POC, support , encouragement [place], R)  Remains safe on unit at this time.

## 2014-05-22 NOTE — Tx Team (Signed)
Interdisciplinary Treatment Plan Update (Adult)  Date:  05/22/2014 Time Reviewed:  8:07 AM  Progress in Treatment: Attending groups: Minimally Participating in groups: Minimally Taking medication as prescribed: Yes Tolerating medication:  Yes Family/Significant othe contact made:  No, CSW attempting to contact girlfriend Patient understands diagnosis:  Yes Discussing patient identified problems/goals with staff:  No., patient flat affect, contracts for safety on unit. Medical problems stabilized or resolved:  No. MD continuing to assess Denies suicidal/homicidal ideation: No., Patient admitted due to endorsing SI and depression. Issues/concerns per patient self-inventory:  No. Other:  New problem(s) identified: No, Describe:  Recent admission  Discharge Plan or Barriers:   2/23: Active suicidal ideation, purchase of gun which patient states is hidden, patient has researched suicide methods including overdose.    2/26: Patient continues to have active suicidal ideation, purchase of gun which patient states is hidden, patient has researched suicide methods including overdose.  [facility] referral made on 2/25.   Reason for Continuation of Hospitalization:  Suicidal ideation Depression  Comments:  Estimated length of stay:  5 - 7 days  New goal(s): Medication stabilization, decrease in SI, increase in coping skills  Review of initial/current patient goals per problem list:    [age] yr male who presents VC in no acute distress for the treatment of SI and Depression. Pt appears flat and depressed. Pt was calm and cooperative with admission process. Pt presents with active SI and contracts for safety upon admission. Pt +ve AVH Mainly at night "nightmares". Pt stated he stopped taking his medications, but don't know why. Pt said he has been steadily declining [facility] Pt lost his job  Patient is reported to be veteran suffering from PTSD, patient denies service  connection.  States he is burden to fiance and chlidren, has purchased gun and hidden outside his residence.  Contracts for safety on unit only.  Is not compliant w outpatient treatment and medications at present - discontinued treatment several weeks ago for unclear reasons.    Current medications:  Gabapentin 600 mg TID, quetiapine 200 mg QHS, trazodone 50mg  QHS.   Attendees: Patient:    Family:    Physician: Dr. Jama Flavorsobos; Dr. Dub MikesLugo 05/22/2014 9:30 AM  Nursing: Manuela SchwartzJennifer Pritchett, Kathi SimpersSarah Twyman, Rodman KeyJanet Webb,  RN 05/22/2014 9:30 AM  Clinical Social Worker: Samuella BruinKristin Kahlea Cobert,  LCSWA 05/22/2014 9:30 AM  Other: Juline PatchQuylle Hodnett, LCSW 05/22/2014 9:30 AM  Other: Leisa LenzValerie Enoch, Vesta MixerMonarch Liaison 05/22/2014 9:30 AM  Other: Onnie BoerJennifer Clark, Case Manager 05/22/2014 9:30 AM  Other: Mosetta AnisAggie Nwoko, Laura Davis  NP 05/22/2014 9:30 AM  Other: Tomasita Morrowelora Sutton, P4CC 05/22/2014 9:30 AM  Other:      Scribe for Treatment Team:   DrinkardWest Carbo, Estephanie Hubbs L, 05/22/2014, 8:07 AM

## 2014-05-22 NOTE — Progress Notes (Signed)
D)  Has been sleeping tonight, eyes closed, snoring softly, no c/o's voiced. A)  Remains on 1:1 obs for safety, mht at bedside. R)  Safety maintained.

## 2014-05-22 NOTE — [facility] Group Notes (Signed)
Adult Psychoeducational Group Note  Date:  05/22/2014 Time:  1030  Group Topic/Focus:  Building Self Esteem:   The Focus of this group is helping patients become aware of the effects of self-esteem on their lives, the things they and others do that enhance or undermine their self-esteem, seeing the relationship between their level of self-esteem and the choices they make and learning ways to enhance self-esteem.  Participation Level:  Active  Participation Quality:  Appropriate  Affect:  Appropriate  Cognitive:  Appropriate  Insight: Appropriate  Engagement in Group:  Developing/Improving  Modes of Intervention:  Discussion  Additional Comments:  Attentive  Earline MayotteKnight, Derrek Puff Shephard 05/22/2014, 12:29 PM

## 2014-05-22 NOTE — [facility] Group Notes (Signed)
[facility] LCSW Group Therapy 05/22/2014 1:15 PM Type of Therapy: Group Therapy Participation Level: Active  Participation Quality: Attentive, Sharing and Supportive  Affect: Depressed and Flat  Cognitive: Alert and Oriented  Insight: Developing/Improving and Engaged  Engagement in Therapy: Developing/Improving and Engaged  Modes of Intervention: Clarification, Confrontation, Discussion, Education, Exploration, Limit-setting, Orientation, Problem-solving, Rapport Building, Dance movement psychotherapisteality Testing, Socialization and Support  Summary of Progress/Problems: The topic for today was feelings about relapse. Pt discussed what relapse prevention is to them and identified triggers that they are on the path to relapse. Pt processed their feeling towards relapse and was able to relate to peers. Pt discussed coping skills that can be used for relapse prevention. Patient identified his relapse as suicidal behavior. CSW and group discussed triggers and consequences of suicidal behavior. Patient maintained that suicide is a way of protecting himself and that he has pushed everyone away to protect them from pain when he tries to harm himself. Patient was not able to verbalize or acknowledge the negative or harmful effects of suicidal behavior. Patient did provide support and encouragement to other patients and engaged in progressive muscle relaxation exercise.    Samuella BruinKristin Baxter Gonzalez, MSW, [employer]CSWA Clinical Social Worker [facility] [phone]

## 2014-05-22 NOTE — Clinical Social Work Note (Signed)
CSW met individually with patient after group per his request. Patient discussed that he continues to feel suicidal at this time and has a plan to kill himself if he leaves the hospital. He discussed wanting to end his relationship with his girlfriend at this time in order to protect her. He asked CSW "what's wrong with me, I feel like I'm going crazy. Does it have anything to do with me being in the [employer]?" CSW provided patient with emotional support and words of encouragement that recovery is possible. Patient and CSW discussed [facility] referral. Patient discussed his desire to get better and thanked CSW for speaking with him.  Tilden Fossa, MSW, [place] Worker [facility] [phone]

## 2014-05-22 NOTE — Progress Notes (Signed)
[facility] MD Progress Note  05/22/2014 12:24 PM Phillip [name]  MRN:  [id number] Subjective: Patient states "I am still same ,I still feel depressed.'  Objective;Patient seen and chart reviewed.Patient discussed with treatment team. Patient today continues to be anxious , depressed , has a flat ,depressed affect , feels that the whole world is against him ,continues to feel the need to die. Patient denies HI/VH. Reports AH is the same and that Haldol did not help a lot last night. Discussed increasing the dose. Patient reported attending a lot of group activities yesterday, except for the evening one. Patient however seen as withdrawn, isolative , internally preoccupied .   Patient per EHR has a pistol hidden somewhere ,which he plans on getting after being DC ed from here. Patient continues to want to kill self . Patient did not see his fiance anymore .Patient continues to stick to his plan to kill self once he gets discharged from here.  Discussed case with CSW -  referral sent to [facility].  Principal Problem: MDD (major depressive disorder), recurrent, severe, with psychosis Diagnosis:   Primary Psychiatric Diagnosis: MDD ,recurrent ,severe with psychosis   Secondary Psychiatric Diagnosis: PTSD Generalized anxiety disorder Cannabis use disorder,moderate  Non Psychiatric Diagnosis:  Patient Active Problem List   Diagnosis Date Noted  . Panic disorder [F41.0] 05/19/2014  . MDD (major depressive disorder), recurrent, severe, with psychosis [F33.3] 05/18/2014  . PTSD (post-traumatic stress disorder) [F43.10] 01/29/2014  . Generalized anxiety disorder [F41.1] 06/19/2011    Class: Chronic   Total Time spent with patient: 30 minutes   Past Medical History:  Past Medical History  Diagnosis Date  . Anxiety   . Depression     Past Surgical History  Procedure Laterality Date  . Cholesterol     Family History:  Family History  Problem Relation Age of Onset  . Cancer Mother   . Cancer  Father   . Depression Father    Social History:  History  Alcohol Use No     History  Drug Use  . Yes  . Special: Marijuana    History   Social History  . Marital Status: Married    Spouse Name: N/A  . Number of Children: N/A  . Years of Education: N/A   Social History Main Topics  . Smoking status: Current Every Day Smoker -- 1.00 packs/day for 19 years    Types: Cigarettes  . Smokeless tobacco: Not on file  . Alcohol Use: No  . Drug Use: Yes    Special: Marijuana  . Sexual Activity: Yes    Birth Control/ Protection: None   Other Topics Concern  . None   Social History Narrative   Additional History:    Sleep: Fair  Appetite:  Fair     Musculoskeletal: Strength & Muscle Tone: within normal limits Gait & Station: normal Patient leans: N/A   Psychiatric Specialty Exam: Physical Exam  ROS  Blood pressure 111/78, pulse 77, temperature 97.4 F (36.3 C), temperature source Oral, resp. rate 16, height  (1.778 m), weight 73.936 kg (163 lb).Body mass index is 23.39 kg/(m^2).  General Appearance: Disheveled  Eye Contact::  Minimal  Speech:  Slow  Volume:  Normal  Mood:  Anxious, Depressed and Hopeless  Affect:  Restricted  Thought Process:  Irrelevant  Orientation:  Full (Time, Place, and Person)  Thought Content:  Delusions and Hallucinations: Auditory  Suicidal Thoughts:  Yes.  with intent/plan contracts for safety here in the hospital, but will  not talk about the pistol which he has hidden somewhere   Homicidal Thoughts:  No  Memory:  Immediate;   Fair Recent;   Fair Remote;   Fair  Judgement:  Impaired  Insight:  Lacking  Psychomotor Activity:  Restlessness  Concentration:  Poor  Recall:  [employer]is[employer]Fair  Fund of Knowledge:Fair  Language: Fair  Akathisia:  No  Handed:  Right  AIMS (if indicated):     Assets:  Communication Skills  ADL's:  Intact  Cognition: WNL  Sleep:  Number of Hours: 6.75     Current Medications: Current  Facility-Administered Medications  Medication Dose Route Frequency Provider Last Rate Last Dose  . alum & mag hydroxide-simeth (MAALOX/MYLANTA) 200-200-20 MG/5ML suspension 30 mL  30 mL Oral Q4H PRN Kristeen MansFran E Hobson, NP   30 mL at 05/21/14 1152  . haloperidol (HALDOL) tablet 5 mg  5 mg Oral BH-q8a2phs Damichael Hofman, MD       And  . benztropine (COGENTIN) tablet 0.5 mg  0.5 mg Oral BH-q8a2phs Dajanay Northrup, MD      . calcium carbonate (TUMS - dosed in mg elemental calcium) chewable tablet 200 mg of elemental calcium  1 tablet Oral TID PRN Rachael FeeIrving A Lugo, MD   200 mg of elemental calcium at 05/21/14 1710  . clonazePAM (KLONOPIN) tablet 1 mg  1 mg Oral TID AC Jomarie LongsSaramma Breeley Bischof, MD   1 mg at 05/22/14 1158  . docusate sodium (COLACE) capsule 100 mg  100 mg Oral BID Jomarie LongsSaramma Jamayia Croker, MD   100 mg at 05/19/14 1654  . famotidine (PEPCID) tablet 20 mg  20 mg Oral BID Rachael FeeIrving A Lugo, MD   20 mg at 05/22/14 0847  . feeding supplement (ENSURE COMPLETE) (ENSURE COMPLETE) liquid 237 mL  237 mL Oral BID BM Rachael FeeIrving A Lugo, MD   237 mL at 05/21/14 1322  . fluvoxaMINE (LUVOX) tablet 75 mg  75 mg Oral BID Jomarie LongsSaramma Naphtali Riede, MD   75 mg at 05/22/14 0847  . gabapentin (NEURONTIN) capsule 600 mg  600 mg Oral TID Kristeen MansFran E Hobson, NP   600 mg at 05/22/14 1158  . hydrOXYzine (ATARAX/VISTARIL) tablet 25 mg  25 mg Oral Q6H PRN Kristeen MansFran E Hobson, NP   25 mg at 05/22/14 0850  . ibuprofen (ADVIL,MOTRIN) tablet 600 mg  600 mg Oral Q6H PRN Kerry HoughSpencer E Simon, PA-C   600 mg at 05/21/14 1322  . magnesium hydroxide (MILK OF MAGNESIA) suspension 30 mL  30 mL Oral Daily PRN Kristeen MansFran E Hobson, NP      . nicotine (NICODERM CQ - dosed in mg/24 hours) patch 21 mg  21 mg Transdermal Daily Rachael FeeIrving A Lugo, MD   21 mg at 05/22/14 0640  . prazosin (MINIPRESS) capsule 1 mg  1 mg Oral QHS Jomarie LongsSaramma Rosslyn Pasion, MD   1 mg at 05/21/14 2209  . QUEtiapine (SEROQUEL) tablet 50 mg  50 mg Oral QHS Monai Hindes, MD      . traZODone (DESYREL) tablet 150 mg  150 mg Oral QHS Jomarie LongsSaramma  Dera Vanaken, MD        Lab Results:  No results found for this or any previous visit (from the past 48 hour(s)).  Physical Findings: AIMS: Facial and Oral Movements Muscles of Facial Expression: None, normal Lips and Perioral Area: None, normal Jaw: None, normal Tongue: None, normal,Extremity Movements Upper (arms, wrists, hands, fingers): None, normal Lower (legs, knees, ankles, toes): None, normal, Trunk Movements Neck, shoulders, hips: None, normal, Overall Severity Incapacitation due to abnormal movements:  None, normal Patient's awareness of abnormal movements (rate only patient's report): No Awareness, Dental Status Current problems with teeth and/or dentures?: No Does patient usually wear dentures?: No  CIWA:  CIWA-Ar Total: 0 COWS:  COWS Total Score: 0  Assessment: Patient is a 44 year old CM with severe anxiety ,depression as well as psychosis, continues to be delusional and suicidal with plan to use a firearm to kill self.Patient has a pistol hidden somewhere , which he plans on using ,as soon as he gets out of here. Patient will not give information about where it is hidden. Patient to be IVCed . Patient  referred for [facility].   Treatment Plan Summary: Daily contact with patient to assess and evaluate symptoms and progress in treatment and Medication management  Will continue Luvox , will increase the dose to 150 mg po daily. Will cross taper  Seroquel with Haldol. Patient would prefer to be on Haldol.Haldol dose to be increased to 15 mg today. Will continue Prazosin 1 mg po qhs for nightmares. Will increaseTrazodone to 150 mg po qhs for sleep. Will continue Klonopin as prescribed. Discussed with patient the risk, SE of being on BZD as well as the need to taper it off. Will monitor for medical issues as well as call consult as needed.  Reviewed labs ,will order as needed.  CSW will start working on disposition. Patient  referred to [facility] for [facility] term placement .  Patient  to participate in therapeutic milieu .       Medical Decision Making:  Review of Psycho-Social Stressors (1), Established Problem, Worsening (2), Review of Last Therapy Session (1), Review of Medication Regimen & Side Effects (2) and Review of New Medication or Change in Dosage (2)     Aleatha Taite MD 05/22/2014, 12:24 PM

## 2014-05-22 NOTE — [facility] Group Notes (Signed)
   [facility] L[facility] Discharge Planning Group Note  05/22/2014  8:45 AM   Participation Quality: Alert, Appropriate and Oriented  Mood/Affect: Depressed and Flat  Depression Rating: Did not want to rate in group  Anxiety Rating: Did not want to rate in group  Thoughts of Suicide: Pt endorses SI and cannot contract for safety outside of hospital but can contract at this time  Will you contract for safety? Yes currently but cannot contract outside of hospital  Current AVH: Pt endorses AVH  Plan for Discharge/Comments: Pt attended discharge planning group and actively participated in group. CSW provided pt with today's workbook. Patient did not participate in discussion and left early. CSW encouraged patient to speak with staff if he does not feel safe or has questions/concerns.  Transportation Means: CSW continuing to assess  Supports: No supports mentioned at this time  Samuella BruinKristin Kammi Hechler, MSW, [employer]CSWA Clinical Social Worker [facility] [phone]

## 2014-05-22 NOTE — [facility] Suicide Risk Assessment (Signed)
[facility] INPATIENT:  Family/Significant Other Suicide Prevention Education  Suicide Prevention Education:  Education Completed;Phillip [name] at (work) [phone]  has been identified by the patient as the family member/significant other with whom the patient will be residing, and identified as the person(s) who will aid the patient in the event of a mental health crisis (suicidal ideations/suicide attempt).  With written consent from the patient, the family member/significant other has been provided the following suicide prevention education, prior to the and/or following the discharge of the patient.  The suicide prevention education provided includes the following:  Suicide risk factors  Suicide prevention and interventions  National Suicide Hotline telephone number  [facility] assessment telephone number -   [facility] 911; fiancee reports she is afraid to contact 911 as he has said in the past he would act out in manner "they would have to shoot me"  [place] and/or Residential Mobile Crisis Unit telephone number - fiancee reports she has the number and [facility]  Request made of family/significant other to:  Remove weapons (e.g., guns, rifles, knives), all items previously/currently identified as safety concern.  Finacee knows of two firearms pt has at friends homes and will contact them asking them to secure and not return to patient.   Remove drugs/medications (over-the-counter, prescriptions, illicit drugs), all items previously/currently identified as a safety concern. Phillip RainwaterFiancee agreeable  The family member/significant other verbalizes understanding of the suicide prevention education information provided.  The family member/significant other agrees to remove the items of safety concern listed above.   Phillip Folksmanda reports patient has not been taking psych meds (has found 45 pills scattered in truck since admit), has told her to  sell all his belongings as he will not be returning and he started giving away things since last summer but this has accelerated since October including hobby airplanes, tools, and more.   Phillip [name], Phillip [name]  05/22/2014, 12:23 PM

## 2014-05-22 NOTE — Clinical Social Work Note (Signed)
Patient requested to speak with CSW individually. Patient reports that he took his haldol medication and is now feeling much happier and is "okay with going to a long term facility". CSW provided emotional support and encouragement.  Samuella BruinKristin Derinda Bartus, MSW, [employer]CSWA Clinical Social Worker [facility] [phone]

## 2014-05-23 ENCOUNTER — Inpatient Hospital Stay (HOSPITAL_COMMUNITY): Payer: Federal, State, Local not specified - Other

## 2014-05-23 ENCOUNTER — Encounter (HOSPITAL_COMMUNITY): Payer: Self-pay | Admitting: Emergency Medicine

## 2014-05-23 DIAGNOSIS — F4310 Post-traumatic stress disorder, unspecified: Secondary | ICD-10-CM

## 2014-05-23 DIAGNOSIS — F1290 Cannabis use, unspecified, uncomplicated: Secondary | ICD-10-CM

## 2014-05-23 LAB — BASIC METABOLIC PANEL
Anion gap: 11 (ref 5–15)
BUN: 15 mg/dL (ref 6–23)
CO2: 25 mmol/L (ref 19–32)
Calcium: 9.8 mg/dL (ref 8.4–10.5)
Chloride: 103 mmol/L (ref 96–112)
Creatinine, Ser: 1.04 mg/dL (ref 0.50–1.35)
GFR calc Af Amer: >90 mL/min (ref >90)
GFR, EST NON AFRICAN AMERICAN: 87 mL/min — AB (ref >90)
Glucose, Bld: 144 mg/dL — ABNORMAL HIGH (ref 70–99)
POTASSIUM: 4.3 mmol/L (ref 3.5–5.1)
Sodium: 139 mmol/L (ref 135–145)

## 2014-05-23 LAB — CBC
HEMATOCRIT: 44.8 % (ref 39.0–52.0)
Hemoglobin: 15.1 g/dL (ref 13.0–17.0)
MCH: 32 pg (ref 26.0–34.0)
MCHC: 33.7 g/dL (ref 30.0–36.0)
MCV: 94.9 fL (ref 78.0–100.0)
Platelets: 260 10*3/uL (ref 150–400)
RBC: 4.72 MIL/uL (ref 4.22–5.81)
RDW: 12.5 % (ref 11.5–15.5)
WBC: 6.6 10*3/uL (ref 4.0–10.5)

## 2014-05-23 LAB — I-STAT TROPONIN, ED: TROPONIN I, POC: 0 ng/mL (ref 0.00–0.08)

## 2014-05-23 LAB — BRAIN NATRIURETIC PEPTIDE: B NATRIURETIC PEPTIDE 5: 20.4 pg/mL (ref 0.0–100.0)

## 2014-05-23 MED ORDER — CLONAZEPAM 1 MG PO TABS
ORAL_TABLET | ORAL | Status: AC
  Administered 2014-05-23: 1 mg via ORAL
  Filled 2014-05-23: qty 1

## 2014-05-23 MED ORDER — PROCHLORPERAZINE EDISYLATE 5 MG/ML IJ SOLN
10.0000 mg | Freq: Once | INTRAMUSCULAR | Status: AC
  Administered 2014-05-23: 10 mg via INTRAVENOUS
  Filled 2014-05-23: qty 2

## 2014-05-23 MED ORDER — DIPHENHYDRAMINE HCL 50 MG/ML IJ SOLN
12.5000 mg | Freq: Once | INTRAMUSCULAR | Status: AC
  Administered 2014-05-23: 12.5 mg via INTRAVENOUS
  Filled 2014-05-23: qty 1

## 2014-05-23 MED ORDER — CLONAZEPAM 1 MG PO TABS: 1.0000 mg | ORAL_TABLET | Freq: Once | ORAL | Status: AC

## 2014-05-23 MED ORDER — CLONAZEPAM 0.5 MG PO TBDP: 1.0000 mg | ORAL_TABLET | Freq: Once | ORAL | Status: DC

## 2014-05-23 NOTE — Progress Notes (Signed)
D)  Was able to get up and go to the AA group this evening, Stated it went well.  Stated his visit this evening with his girlfriend had also gone well.  Stated he has decided to go to long term, and was a little nervous about telling her, but when he did tonight, she told him she would be there for him every step of the way.  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  Still struggles with nightmares of combat and the anxiety. A)  Support, encouragement, will continue to monitor for safety, continue POC R)  Safety maintained.

## 2014-05-23 NOTE — Progress Notes (Signed)
"  I think I had a panic attack earlier". Pt denies chest pain or any discomfort. Will continue to monitor closely.

## 2014-05-23 NOTE — ED Provider Notes (Signed)
CSN: [id number]     Arrival date & time 05/23/14  1529 History   First MD Initiated Contact with Patient 05/23/14 1736     Chief Complaint  Patient presents with  . Chest Pain  . Shortness of Breath  . Sent from [facility] (anxiety)      (Consider location/radiation/quality/duration/timing/severity/associated sxs/prior Treatment) Patient is a 44 y.o. male presenting with chest pain and shortness of breath. The history is provided by the patient and medical records. No language interpreter was used.  Chest Pain Associated symptoms: headache and shortness of breath   Associated symptoms: no abdominal pain, no back pain, no cough, no diaphoresis, no fatigue, no fever, no nausea and not vomiting   Shortness of Breath Associated symptoms: chest pain and headaches   Associated symptoms: no abdominal pain, no cough, no diaphoresis, no fever, no rash, no vomiting and no wheezing      Lawson Mahone is a 44 y.o. male  with a hx of anxiety, depression presents to the Emergency Department complaining of gradual, persistent, progressively worsening SOB onset 3PM today. Associated symptoms include burning of his skin, blurred vision and jaw tightness.  Pt also reports chest and back pain which is more of a stiffness.  Pt reports no episodes similar to this is the past.  He denies sick contacts.  Pt reports frontal throbbing headache without neck pain or neck stiffness.  He reports his vision is both double and blurry.  He does not wear glasses.  He denies fevers, chills, abd pain, N/V/D, weakness, numbness, tingling, syncope or dysuria.  Pt reports taking klonopin 3x per day and he took it today when it started.  He reports this did not help.  He denies Hx of migraine headaches.  He also denies ST, rhinorrhea, sinus congestion, otalgia, cough.   Pt reports he is a 1 ppd smoker, but has not been smoking at [facility]; he has been using a nicotine patch.  Pt denies EtOH usage and regular street drug usage.     Past Medical  History  Diagnosis Date  . Anxiety   . Depression    Past Surgical History  Procedure Laterality Date  . Cholesterol     Family History  Problem Relation Age of Onset  . Cancer Mother   . Cancer Father   . Depression Father    History  Substance Use Topics  . Smoking status: Current Every Day Smoker -- 1.00 packs/day for 19 years    Types: Cigarettes  . Smokeless tobacco: Not on file  . Alcohol Use: No    Review of Systems  Constitutional: Negative for fever, diaphoresis, appetite change, fatigue and unexpected weight change.  HENT: Positive for sinus pressure. Negative for mouth sores.        Jaw tightness  Eyes: Negative for visual disturbance.  Respiratory: Positive for shortness of breath. Negative for cough, chest tightness and wheezing.   Cardiovascular: Positive for chest pain.  Gastrointestinal: Negative for nausea, vomiting, abdominal pain, diarrhea and constipation.  Endocrine: Negative for polydipsia, polyphagia and polyuria.  Genitourinary: Negative for dysuria, urgency, frequency and hematuria.  Musculoskeletal: Positive for myalgias. Negative for back pain and neck stiffness.  Skin: Negative for rash.  Allergic/Immunologic: Negative for immunocompromised state.  Neurological: Positive for headaches. Negative for syncope and light-headedness.  Hematological: Does not bruise/bleed easily.  Psychiatric/Behavioral: Negative for sleep disturbance. The patient is nervous/anxious.       Allergies  Review of patient's allergies indicates no known allergies.  Home Medications  Prior to Admission medications   Medication Sig Start Date End Date Taking? Authorizing Provider  clonazePAM (KLONOPIN) 1 MG tablet Take 1 tablet (1 mg total) by mouth 3 (three) times daily before meals. 02/02/14  Yes Velna Hatchet May Agustin, NP  gabapentin (NEURONTIN) 300 MG capsule Take 2 capsules (600 mg total) by mouth 4 (four) times daily. 02/02/14  Yes Velna Hatchet May Agustin, NP  hydrOXYzine  (ATARAX/VISTARIL) 25 MG tablet Take 1 tablet (25 mg total) by mouth every 6 (six) hours as needed for anxiety. 02/02/14  Yes Velna Hatchet May Agustin, NP  nicotine (NICODERM CQ - DOSED IN MG/24 HOURS) 21 mg/24hr patch Place 1 patch (21 mg total) onto the skin daily. 02/02/14  Yes Velna Hatchet May Agustin, NP  Pseudoeph-Doxylamine-DM-APAP (NYQUIL PO) Take 30 mLs by mouth at bedtime as needed (sleep).   Yes Historical Provider, MD  QUEtiapine (SEROQUEL) 400 MG tablet Take 1 tablet (400 mg total) by mouth at bedtime. 02/02/14  Yes Velna Hatchet May Agustin, NP  fluvoxaMINE (LUVOX) 50 MG tablet Take 1 tablet (50 mg total) by mouth 2 (two) times daily. Patient not taking: Reported on 05/17/2014 02/02/14   Velna Hatchet May Agustin, NP  pantoprazole (PROTONIX) 40 MG tablet Take 1 tablet (40 mg total) by mouth daily. Patient not taking: Reported on 05/17/2014 02/02/14   [facility], NP  traZODone (DESYREL) 100 MG tablet Take 100 mg by mouth at bedtime. 03/12/14   Historical Provider, MD   BP 138/84 mmHg  Pulse 67  Temp(Src) 97.5 F (36.4 C) (Oral)  Resp 18  Ht 5\' 10"  (1.778 m)  Wt 163 lb (73.936 kg)  BMI 23.39 kg/m2  SpO2 97% Physical Exam  Constitutional: He is oriented to person, place, and time. He appears well-developed and well-nourished. No distress.  Awake, alert, nontoxic appearance  HENT:  Head: Normocephalic and atraumatic.  Mouth/Throat: Oropharynx is clear and moist. No oropharyngeal exudate.  Eyes: Conjunctivae and EOM are normal. Pupils are equal, round, and reactive to light. No scleral icterus.  No horizontal, vertical or rotational nystagmus Visual acuity: 20/25 on right eye and 20/30 on left side with c/o double vision intermittently throughout exam  Neck: Normal range of motion. Neck supple.  Full active and passive ROM without pain No midline or paraspinal tenderness No nuchal rigidity or meningeal signs  Cardiovascular: Normal rate, regular rhythm, normal heart sounds and intact distal pulses.    No murmur heard. Pulmonary/Chest: Effort normal and breath sounds normal. No respiratory distress. He has no wheezes. He has no rales. He exhibits no tenderness.  Equal chest expansion Clear and equal breath sounds No chest tenderness  Abdominal: Soft. Bowel sounds are normal. He exhibits no mass. There is no tenderness. There is no rebound and no guarding.  Soft and nontender  Musculoskeletal: Normal range of motion. He exhibits no edema.  Lymphadenopathy:    He has no cervical adenopathy.  Neurological: He is alert and oriented to person, place, and time. He has normal reflexes. No cranial nerve deficit. He exhibits normal muscle tone. Coordination normal.  Mental Status:  Alert, oriented, thought content appropriate. Speech fluent without evidence of aphasia. Able to follow 2 step commands without difficulty.  Cranial Nerves:  II:  Peripheral visual fields grossly normal, pupils equal, round, reactive to light III,IV, VI: ptosis not present, extra-ocular motions intact bilaterally  V,VII: smile symmetric, facial light touch sensation equal VIII: hearing grossly normal bilaterally  IX,X: gag reflex present  XI: bilateral shoulder shrug equal and strong XII: midline tongue  extension  Motor:  5/5 in upper and lower extremities bilaterally including strong and equal grip strength and dorsiflexion/plantar flexion Sensory: Pinprick and light touch normal in all extremities.  Deep Tendon Reflexes: 2+ and symmetric  Cerebellar: normal finger-to-nose with bilateral upper extremities Gait: normal gait and balance CV: distal pulses palpable throughout   Skin: Skin is warm and dry. No rash noted. He is not diaphoretic. No erythema.  NO rash  Psychiatric: His behavior is normal. Judgment and thought content normal. His mood appears anxious.  Patient is anxious Flat affect  Nursing note and vitals reviewed.   ED Course  Procedures (including critical care time) Labs Review Labs Reviewed   BASIC METABOLIC PANEL - Abnormal; Notable for the following:    Glucose, Bld 144 (*)    GFR calc non Af Amer 87 (*)    All other components within normal limits  GABAPENTIN LEVEL  CBC  BRAIN NATRIURETIC PEPTIDE  I-STAT TROPOININ, ED    Imaging Review Dg Chest 2 View  05/23/2014   CLINICAL DATA:  Chest pain, shortness of Breath.  EXAM: CHEST  2 VIEW  COMPARISON:  06/04/2013  FINDINGS: Minimal left basilar subsegmental atelectasis. Right lung is clear. Heart is normal size. No effusions. No acute bony abnormality.  IMPRESSION: Left base atelectasis.  No active disease.   Electronically Signed   By: Charlett NoseKevin  Dover M.D.   On: 05/23/2014 17:03     EKG Interpretation   Date/Time:  Saturday May 23 2014 15:41:21 EST Ventricular Rate:  73 PR Interval:  177 QRS Duration: 75 QT Interval:  359 QTC Calculation: 395 R Axis:   35 Text Interpretation:  Sinus rhythm Borderline ST elevation, lateral leads  Baseline wander in lead(s) V1 No significant change was found Confirmed by  [facility]  MD, TREY ([id number]) on 05/23/2014 3:46:15 PM    MDM   Final diagnoses:  Anxiety  Nonintractable headache, unspecified chronicity pattern, unspecified headache type  Panic disorder    Trey SailorsSteven Novello presents with complaints of "skin burning" shortness of breath, frontal headache and double and blurry vision.  Patient is very anxious.  He has numerous complaints. Labs are reassuring with negative troponin, no leukocytosis, no electrolyte disturbance normal BNP and chest x-ray without evidence of pneumonia, pneumothorax or pulmonary edema.  Patient symptoms potentially secondary to anxiety versus migrainous headache. Less likely acute coronary syndrome, PE.  No rash.  No nuchal rigidity, fever or meningeal signs to suggest meningitis. Will treat headache and reassess.  9:06 PM Patient given Compazine and Benadryl. He reports complete resolution of his symptoms including double vision, skin burning and  shortness of breath. Patient reports that he feels much less anxious now. He reports that he feels normal and wishes to return to behavioral health. Gross neurologic exam remains intact.  Chest pain is not likely of cardiac or pulmonary etiology d/t presentation, PERC negative, VSS, no tracheal deviation, no JVD or new murmur, RRR, breath sounds equal bilaterally, EKG without acute abnormalities, negative troponin, and negative CXR.  No evidence of CVA.  Heart Score 0.  I have personally reviewed patient's vitals, nursing note and any pertinent labs or imaging.  I performed an undressed physical exam.    It has been determined that no acute conditions requiring further emergency intervention are present at this time. The patient/guardian have been advised of the diagnosis and plan. I reviewed all labs and imaging including any potential incidental findings. We have discussed signs and symptoms that warrant return to the ED  and they are listed in the discharge instructions.    Vital signs are stable at discharge.   BP 138/84 mmHg  Pulse 67  Temp(Src) 97.5 F (36.4 C) (Oral)  Resp 18  Ht  (1.778 m)  Wt 163 lb (73.936 kg)  BMI 23.39 kg/m2  SpO2 97%         Dierdre Forth, PA-C 05/23/14 2107  Dierdre Forth, PA-C 05/23/14 2108  Candyce Churn III, MD 05/24/14 [phone]

## 2014-05-23 NOTE — ED Notes (Signed)
Patient vision was 20/25 on right eye and 20/30 on left side. He saw double letters at times, says he has double vision. Patient has headache

## 2014-05-23 NOTE — Progress Notes (Addendum)
Patient ID: Phillip SailorsSteven [name], male   DOB: 1970/08/10, 44 y.o.   MRN: [id number]   Pt reported to this writer that he was having SOB and chest tightness, I checked his vital signs his BP 131/81 HR 87 TEMP 98.0 RESP 22 O2 SAT 98% RA. Pt reported that he was scared and could not be alone. When patient first complained Aggie NP ordered for patient to have Klonopin 1mg  as a one time order. Pt was given the Klonopin, and once reassessed he reported that he was getting worse. Pt reported that his skin was burning, and that he was burning all over. Pt reported that his SOB was getting worse, and that he has never felt like this before. Pt reported that he felt like he was dying and requested that he not be left alone. Aggie NP assessed patient, and ordered that he be transported to the ER for medical clearance. [facility] Scient[facility]ty [facility]

## 2014-05-23 NOTE — Progress Notes (Signed)
Patient ID: Phillip SailorsSteven Teal, male   DOB: [birth year], 44 y.o.   MRN: [id number]   D: Pt has been very flat and depressed on the unit today. Pt did attend all groups and engaged in treatment. Pt took all medications without any problems, pt did reported that he was feeling very weak and tired. Pt reported that his depression was a 2, his hopelessness was a 0, and that his anxiety was a 5. Pt reported that his goal for today was to work on how to cope with outside stress. Pt reported being negative SI/HI, no AH/VH noted. A: 15 min checks continued for patient safety. R: Pt safety maintained.

## 2014-05-23 NOTE — Discharge Instructions (Signed)
Patient to be discharged back to behavioral health.

## 2014-05-23 NOTE — Progress Notes (Signed)
[facility] Pt alert and cooperative. Denies pain or discomfort. Affect/mood depressed. -SI/HI, -A/[place]. Verbally contracts for safety. Emotional support and encouragement given. Will monitor closely and evaluate for stabilization.

## 2014-05-23 NOTE — ED Notes (Signed)
[facility], sent to ED for chest pain, SOB, and anxiety. Pt c/o jaw tightness and body aches.

## 2014-05-23 NOTE — [facility] Group Notes (Signed)
[facility] Group Notes: (Clinical Social Work)   05/23/2014      Type of Therapy:  Group Therapy   Participation Level:  Did Not Attend - was being interviewed by another Child psychotherapistsocial worker.  Came in toward end of group but did not participate.   Ambrose MantleMareida Grossman-Orr, LCSW 05/23/2014, 12:29 PM

## 2014-05-23 NOTE — Progress Notes (Signed)
The patient attended last evening's A.A. Meeting and was appropriate.  

## 2014-05-23 NOTE — [facility] Group Notes (Signed)
[facility] Group Notes:  Coping skills  Date:  05/23/2014  Time:  2:56 PM  Type of Therapy:  Nurse Education  Participation Level:  Minimal  Participation Quality:  Appropriate  Affect:  Appropriate  Cognitive:  Appropriate  Insight:  Appropriate  Engagement in Group:  Engaged  Modes of Intervention:  Discussion  Summary of Progress/Problems:  [name], Norman Piacentini Guyes 05/23/2014, 2:56 PM

## 2014-05-23 NOTE — Progress Notes (Signed)
[name], male   DOB: [birth year], 44 y.o.   MRN: [id number] [facility] MD Progress Note  05/23/2014 3:01 PM Phillip [name]  MRN:  [id number]  Subjective: Patient states; "I feel very anxious . I'm having shortness of breath. I can hardly breath. My chest is tighting. I feel very afraid. I see everything in front of me in a wavy-like movement movement. Please don't leave me by myself in here".  Objective; Patient seen and chart reviewed. Patient discussed with treatment team. Patient today continues to be withdrawn, isolative , internally preoccupied. He is complaining of multiple symptoms. He does not want to be left alone, and yet does not want to be around people. He is jumpy in a startle reflex. He had both his eyes closed until prompt to open eyes. He is currently being sent to the ED for evaluation.  Principal Problem: MDD (major depressive disorder), recurrent, severe, with psychosis Diagnosis:   Primary Psychiatric Diagnosis: MDD ,recurrent ,severe with psychosis   Secondary Psychiatric Diagnosis: PTSD Generalized anxiety disorder Cannabis use disorder,moderate  Non Psychiatric Diagnosis:  Patient Active Problem List   Diagnosis Date Noted  . Panic disorder [F41.0] 05/19/2014  . MDD (major depressive disorder), recurrent, severe, with psychosis [F33.3] 05/18/2014  . PTSD (post-traumatic stress disorder) [F43.10] 01/29/2014  . Generalized anxiety disorder [F41.1] 06/19/2011    Class: Chronic   Total Time spent with patient: 30 minutes  Past Medical History:  Past Medical History  Diagnosis Date  . Anxiety   . Depression     Past Surgical History  Procedure Laterality Date  . Cholesterol     Family History:  Family History  Problem Relation Age of Onset  . Cancer Mother   . Cancer Father   . Depression Father    Social History:  History  Alcohol Use No     History  Drug Use  . Yes  . Special: Marijuana    History   Social History  . Marital  Status: Married    Spouse Name: N/A  . Number of Children: N/A  . Years of Education: N/A   Social History Main Topics  . Smoking status: Current Every Day Smoker -- 1.00 packs/day for 19 years    Types: Cigarettes  . Smokeless tobacco: Not on file  . Alcohol Use: No  . Drug Use: Yes    Special: Marijuana  . Sexual Activity: Yes    Birth Control/ Protection: None   Other Topics Concern  . None   Social History Narrative   Additional History:    Sleep: Fair  Appetite:  Fair  Musculoskeletal: Strength & Muscle Tone: within normal limits Gait & Station: normal Patient leans: N/A  Psychiatric Specialty Exam: Physical Exam  ROS  Blood pressure 141/80, pulse 85, temperature 97.6 F (36.4 C), temperature source Oral, resp. rate 18, height  (1.778 m), weight 73.936 kg (163 lb).Body mass index is 23.39 kg/(m^2).  General Appearance: Disheveled  Eye Contact::  Minimal  Speech:  Slow  Volume:  Normal  Mood:  Anxious, Depressed and Irritable  Affect:  Restricted  Thought Process:  Disorganized and Irrelevant  Orientation:  Full (Time, Place, and Person)  Thought Content:  Delusions and Hallucinations: Auditory Visual  Suicidal Thoughts:  Yes.  with intent/plan contracts for safety  Homicidal Thoughts:  No  Memory:  Immediate;   Fair Recent;   Fair Remote;   Fair  Judgement:  Impaired  Insight:  Lacking  Psychomotor Activity:  Restlessness  Concentration:  Poor  Recall:  [employer]Fair  Fund of Knowledge:Fair  Language: Fair  Akathisia:  No  Handed:  Right  AIMS (if indicated):     Assets:  Communication Skills  ADL's:  Intact  Cognition: WNL  Sleep:  Number of Hours: 6.75   Current Medications: Current Facility-Administered Medications  Medication Dose Route Frequency Provider Last Rate Last Dose  . alum & mag hydroxide-simeth (MAALOX/MYLANTA) 200-200-20 MG/5ML suspension 30 mL  30 mL Oral Q4H PRN Kristeen MansFran E Hobson, NP   30 mL at 05/21/14 1152  . haloperidol (HALDOL)  tablet 5 mg  5 mg Oral [id number] Saramma Eappen, MD   5 mg at 05/23/14 1252   And  . benztropine (COGENTIN) tablet 0.5 mg  0.5 mg Oral [id number] Jomarie LongsSaramma Eappen, MD   0.5 mg at 05/23/14 1252  . calcium carbonate (TUMS - dosed in mg elemental calcium) chewable tablet 200 mg of elemental calcium  1 tablet Oral TID PRN Rachael FeeIrving A Lugo, MD   200 mg of elemental calcium at 05/21/14 1710  . clonazePAM (KLONOPIN) tablet 1 mg  1 mg Oral TID AC Jomarie LongsSaramma Eappen, MD   1 mg at 05/23/14 1421  . docusate sodium (COLACE) capsule 100 mg  100 mg Oral BID Jomarie LongsSaramma Eappen, MD   100 mg at 05/19/14 1654  . famotidine (PEPCID) tablet 20 mg  20 mg Oral BID Rachael FeeIrving A Lugo, MD   20 mg at 05/23/14 1027  . feeding supplement (ENSURE COMPLETE) (ENSURE COMPLETE) liquid 237 mL  237 mL Oral BID BM Rachael FeeIrving A Lugo, MD   237 mL at 05/23/14 1252  . fluvoxaMINE (LUVOX) tablet 75 mg  75 mg Oral BID Jomarie LongsSaramma Eappen, MD   75 mg at 05/23/14 1027  . gabapentin (NEURONTIN) capsule 600 mg  600 mg Oral TID Kristeen MansFran E Hobson, NP   600 mg at 05/23/14 1252  . hydrOXYzine (ATARAX/VISTARIL) tablet 25 mg  25 mg Oral Q6H PRN Kristeen MansFran E Hobson, NP   25 mg at 05/23/14 1027  . ibuprofen (ADVIL,MOTRIN) tablet 600 mg  600 mg Oral Q6H PRN Kerry HoughSpencer E Simon, PA-C   600 mg at 05/21/14 1322  . magnesium hydroxide (MILK OF MAGNESIA) suspension 30 mL  30 mL Oral Daily PRN Kristeen MansFran E Hobson, NP      . nicotine (NICODERM CQ - dosed in mg/24 hours) patch 21 mg  21 mg Transdermal Daily Rachael FeeIrving A Lugo, MD   21 mg at 05/23/14 [phone]  . prazosin (MINIPRESS) capsule 1 mg  1 mg Oral QHS Jomarie LongsSaramma Eappen, MD   1 mg at 05/22/14 2125  . traZODone (DESYREL) tablet 150 mg  150 mg Oral QHS Jomarie LongsSaramma Eappen, MD   150 mg at 05/22/14 2125   Lab Results:  No results found for this or any previous visit (from the past 48 hour(s)).  Physical Findings: AIMS: Facial and Oral Movements Muscles of Facial Expression: None, normal Lips and Perioral Area: None, normal Jaw: None, normal Tongue: None,  normal,Extremity Movements Upper (arms, wrists, hands, fingers): None, normal Lower (legs, knees, ankles, toes): None, normal, Trunk Movements Neck, shoulders, hips: None, normal, Overall Severity Incapacitation due to abnormal movements: None, normal Patient's awareness of abnormal movements (rate only patient's report): No Awareness, Dental Status Current problems with teeth and/or dentures?: No Does patient usually wear dentures?: No  CIWA:  CIWA-Ar Total: 0 COWS:  COWS Total Score: 0  Assessment: Patient is a 44 year old CM with severe anxiety ,depression as well as psychosis, continues  to be delusional and suicidal with plan to use a firearm to kill self.Patient has a pistol hidden somewhere , which he plans on using ,as soon as he gets out of here. Patient will not give information about where it is hidden. Patient to be IVCed . Patient to be referred for [facility] Placement.  Treatment Plan Summary: Daily contact with patient to assess and evaluate symptoms and progress in treatment and Medication management:  Transfer to the ED for evaluation of shortness of breath & chest tightness.  Medical Decision Making:  Review of Psycho-Social Stressors (1), Established Problem, Worsening (2), Review of Last Therapy Session (1), Review of Medication Regimen & Side Effects (2) and Review of New Medication or Change in Dosage (2)  Sanjuana Kava, PMHNP, FNP-BC 05/23/2014, 3:01 PM  I agreed with the findings, treatment and disposition plan of this patient. Kathryne Sharper, MD

## 2014-05-24 NOTE — Progress Notes (Signed)
Adult Psychoeducational Group Note  Date:  05/24/2014 Time:  2015  Group Topic/Focus:  AA Meeting  Participation Level:  Active  Additional Comments:  Pt attended group.  Chanse Kagel, [facility] 05/24/2014, 10:35 PM

## 2014-05-24 NOTE — [facility] Group Notes (Signed)
[facility] Group Notes:  (Nursing/MHT/Case Management/Adjunct)  Date:  05/24/2014  Time:  3:51 PM  Type of Therapy:  Psychoeducational Skills  Participation Level:  Did Not Attend  Participation Quality:  Did Not Attend  Affect:  Did Not Attend  Cognitive:  Did Not Attend  Insight:  None  Engagement in Group:  Did Not Attend  Modes of Intervention:  Did Not Attend  Summary of Progress/Problems: Pt did attend healthy support systems group.   Jamarie [place]oplin Shanta 05/24/2014, 3:51 PM 

## 2014-05-24 NOTE — [facility] Group Notes (Signed)
[facility] Group Notes:  (Clinical Social Work)  05/24/2014  10:00-11:00AM  Summary of Progress/Problems:   The main focus of today's process group was to   1)  discuss the importance of adding supports  2)  define health supports versus unhealthy supports  3)  identify the patient's current unhealthy supports and plan how to handle them  4)  Identify the patient's current healthy supports and plan what to add.  An emphasis was placed on using counselor, doctor, therapy groups, 12-step groups, and problem-specific support groups to expand supports.    The patient expressed full comprehension of the concepts presented, and agreed that there is a need to add more supports.  The patient stated he has no healthy supports and his unhealthy support is himself.  He stated he felt better today, but he also did not participate in the group discussion very much, and only when called on directly.  Type of Therapy:  Process Group with Motivational Interviewing  Participation Level:  Minimal  Participation Quality:  Attentive  Affect:  Depressed and Flat  Cognitive:  Alert  Insight:  Limited  Engagement in Therapy:  Limited  Modes of Intervention:   Education, Support and Processing, Activity  Phillip MantleMareida Grossman-Orr, LCSW 05/24/2014, 12:15pm

## 2014-05-24 NOTE — Progress Notes (Signed)
Psychoeducational Group Note  Date:  05/24/2014 Time:  0024  Group Topic/Focus:  Wrap-Up Group:   The focus of this group is to help patients review their daily goal of treatment and discuss progress on daily workbooks.  Participation Level: Did Not Attend  Participation Quality:  Not Applicable  Affect:  Not Applicable  Cognitive:  Not Applicable  Insight:  Not Applicable  Engagement in Group: Not Applicable  Additional Comments:  The patient was unable to attend group last evening since he was in the emergency room receiving treatment.   Everson Mott S 05/24/2014, 12:24 AM

## 2014-05-24 NOTE — Progress Notes (Signed)
Patient ID: Trey SailorsSteven Rotunno, male   DOB: [birth year], 44 y.o.   MRN: [id number]   D: Pt continues to be very depressed and anxious on the unit. Pt did report that he was feeling much better, and that his anxiety is nothing like it was yesterday. Pt reported that his depression was a 0, his hopelessness was a 0, and his anxiety was a 2. Pt reported that his goal for today was to stay well. Pt reported being negative SI/HI, no AH/VH noted. A: 15 min checks continued for patient safety. R: Pt safety maintained.

## 2014-05-24 NOTE — Progress Notes (Addendum)
[name], male   DOB: 1970-10-03, 44 y.o.   MRN: [id number] [name], male   DOB: 1970-04-21, 44 y.o.   MRN: [id number] [facility] MD Progress Note  05/24/2014 3:00 PM Phillip [name]  MRN:  [id number]  Subjective: Phillip [name] states; "I'm doing better today. This is because of God & my medicines. I prayed to God last night and he came into my life. I have been feeling really good all day".   Objective; Patient seen and chart reviewed. Patient discussed with treatment team. Patient today seem to be doing well. He is out of his room participating in group sessions. He presents with good affects & eye contact. He says the voices in his head are calming down as of today. He adds today is the only day that he has not felt suicidal. He denies any new issues. He is tolerating his medicines without any adverse effects.   Principal Problem: MDD (major depressive disorder), recurrent, severe, with psychosis Diagnosis:   Primary Psychiatric Diagnosis: MDD ,recurrent ,severe with psychosis   Secondary Psychiatric Diagnosis: PTSD Generalized anxiety disorder Cannabis use disorder,moderate  Non Psychiatric Diagnosis:  Patient Active Problem List   Diagnosis Date Noted  . Panic disorder [F41.0] 05/19/2014  . MDD (major depressive disorder), recurrent, severe, with psychosis [F33.3] 05/18/2014  . PTSD (post-traumatic stress disorder) [F43.10] 01/29/2014  . Generalized anxiety disorder [F41.1] 06/19/2011    Class: Chronic   Total Time spent with patient: 30 minutes  Past Medical History:  Past Medical History  Diagnosis Date  . Anxiety   . Depression     Past Surgical History  Procedure Laterality Date  . Cholesterol     Family History:  Family History  Problem Relation Age of Onset  . Cancer Mother   . Cancer Father   . Depression Father    Social History:  History  Alcohol Use No     History  Drug Use  . Yes  . Special: Marijuana    History   Social  History  . Marital Status: Married    Spouse Name: N/A  . Number of Children: N/A  . Years of Education: N/A   Social History Main Topics  . Smoking status: Current Every Day Smoker -- 1.00 packs/day for 19 years    Types: Cigarettes  . Smokeless tobacco: Not on file  . Alcohol Use: No  . Drug Use: Yes    Special: Marijuana  . Sexual Activity: Yes    Birth Control/ Protection: None   Other Topics Concern  . None   Social History Narrative   Additional History:    Sleep: Good  Appetite:  Good  Musculoskeletal: Strength & Muscle Tone: within normal limits Gait & Station: normal Patient leans: N/A  Psychiatric Specialty Exam: Physical Exam  ROS  Blood pressure 105/77, pulse 101, temperature 97.7 F (36.5 C), temperature source Oral, resp. rate 18, height $RemoveBe'5\' 10"'HxgMqSgpt$  (1.778 m), weight 73.936 kg (163 lb), SpO2 97 %.Body mass index is 23.39 kg/(m^2).  General Appearance: Disheveled  Eye Contact::  Minimal  Speech:  Slow  Volume:  Normal  Mood:  Anxious, Depressed and Irritable  Affect:  Restricted  Thought Process:  Disorganized and Irrelevant  Orientation:  Full (Time, Place, and Person)  Thought Content:  Delusions, Hallucinations: Auditory Visual and "Symptoms improvig"  Suicidal Thoughts:  No   Homicidal Thoughts:  No  Memory:  Immediate;   Fair Recent;   Fair Remote;   Fair  Judgement:  Impaired  Insight:  Lacking  Psychomotor Activity:  Normal  Concentration:  Poor  Recall:  Fair  Fund of Knowledge:Fair  Language: Fair  Akathisia:  No  Handed:  Right  AIMS (if indicated):     Assets:  Communication Skills  ADL's:  Intact  Cognition: WNL  Sleep:  Number of Hours: 6.5   Current Medications: Current Facility-Administered Medications  Medication Dose Route Frequency Provider Last Rate Last Dose  . alum & mag hydroxide-simeth (MAALOX/MYLANTA) 200-200-20 MG/5ML suspension 30 mL  30 mL Oral Q4H PRN Kristeen Mans, NP   30 mL at 05/21/14 1152  . haloperidol  (HALDOL) tablet 5 mg  5 mg Oral BH-q8a2phs Saramma Eappen, MD   5 mg at 05/24/14 1304   And  . benztropine (COGENTIN) tablet 0.5 mg  0.5 mg Oral BH-q8a2phs Saramma Eappen, MD   0.5 mg at 05/24/14 1304  . calcium carbonate (TUMS - dosed in mg elemental calcium) chewable tablet 200 mg of elemental calcium  1 tablet Oral TID PRN Rachael Fee, MD   200 mg of elemental calcium at 05/21/14 1710  . clonazePAM (KLONOPIN) tablet 1 mg  1 mg Oral TID AC Jomarie Longs, MD   1 mg at 05/24/14 1148  . docusate sodium (COLACE) capsule 100 mg  100 mg Oral BID Jomarie Longs, MD   100 mg at 05/19/14 1654  . famotidine (PEPCID) tablet 20 mg  20 mg Oral BID Rachael Fee, MD   20 mg at 05/24/14 [phone]  . feeding supplement (ENSURE COMPLETE) (ENSURE COMPLETE) liquid 237 mL  237 mL Oral BID BM Rachael Fee, MD   237 mL at 05/24/14 1149  . fluvoxaMINE (LUVOX) tablet 75 mg  75 mg Oral BID Jomarie Longs, MD   75 mg at 05/24/14 0953  . gabapentin (NEURONTIN) capsule 600 mg  600 mg Oral TID Kristeen Mans, NP   600 mg at 05/24/14 1148  . hydrOXYzine (ATARAX/VISTARIL) tablet 25 mg  25 mg Oral Q6H PRN Kristeen Mans, NP   25 mg at 05/23/14 1027  . ibuprofen (ADVIL,MOTRIN) tablet 600 mg  600 mg Oral Q6H PRN Kerry Hough, PA-C   600 mg at 05/21/14 1322  . magnesium hydroxide (MILK OF MAGNESIA) suspension 30 mL  30 mL Oral Daily PRN Kristeen Mans, NP      . nicotine (NICODERM CQ - dosed in mg/24 hours) patch 21 mg  21 mg Transdermal Daily Rachael Fee, MD   21 mg at 05/24/14 0953  . prazosin (MINIPRESS) capsule 1 mg  1 mg Oral QHS Jomarie Longs, MD   1 mg at 05/23/14 2147  . traZODone (DESYREL) tablet 150 mg  150 mg Oral QHS Jomarie Longs, MD   150 mg at 05/23/14 2147   Lab Results:  Results for orders placed or performed during the hospital encounter of 05/18/14 (from the past 48 hour(s))  CBC     Status: None   Collection Time: 05/23/14  4:02 PM  Result Value Ref Range   WBC 6.6 4.0 - 10.5 K/uL   RBC 4.72 4.22 - 5.81  MIL/uL   Hemoglobin 15.1 13.0 - 17.0 g/dL   HCT 47.6 89.1 - 55.2 %   MCV 94.9 78.0 - 100.0 fL   MCH 32.0 26.0 - 34.0 pg   MCHC 33.7 30.0 - 36.0 g/dL   RDW 53.6 48.3 - 89.3 %   Platelets 260 150 - 400 K/uL  Basic metabolic panel  Status: Abnormal   Collection Time: 05/23/14  4:02 PM  Result Value Ref Range   Sodium 139 135 - 145 mmol/L   Potassium 4.3 3.5 - 5.1 mmol/L   Chloride 103 96 - 112 mmol/L   CO2 25 19 - 32 mmol/L   Glucose, Bld 144 (H) 70 - 99 mg/dL   BUN 15 6 - 23 mg/dL   Creatinine, Ser 1.04 0.50 - 1.35 mg/dL   Calcium 9.8 8.4 - 10.5 mg/dL   GFR calc non Af Amer 87 (L) >90 mL/min   GFR calc Af Amer >90 >90 mL/min    Comment: (NOTE) The eGFR has been calculated using the CKD EPI equation. This calculation has not been validated in all clinical situations. eGFR's persistently <90 mL/min signify possible Chronic Kidney Disease.    Anion gap 11 5 - 15    Comment: Performed at [facility]  BNP (order ONLY if patient complains of dyspnea/SOB AND you have documented it for THIS visit)     Status: None   Collection Time: 05/23/14  4:02 PM  Result Value Ref Range   B Natriuretic Peptide 20.4 0.0 - 100.0 pg/mL    Comment: Performed at [facility], ED (not at [facility])     Status: None   Collection Time: 05/23/14  4:14 PM  Result Value Ref Range   Troponin i, poc 0.00 0.00 - 0.08 ng/mL   Comment 3            Comment: Due to the release kinetics of cTnI, a negative result within the first hours of the onset of symptoms does not rule out myocardial infarction with certainty. If myocardial infarction is still suspected, repeat the test at appropriate intervals.     Physical Findings: AIMS: Facial and Oral Movements Muscles of Facial Expression: None, normal Lips and Perioral Area: None, normal Jaw: None, normal Tongue: None, normal,Extremity Movements Upper (arms, wrists, hands, fingers): None, normal Lower (legs, knees, ankles, toes):  None, normal, Trunk Movements Neck, shoulders, hips: None, normal, Overall Severity Incapacitation due to abnormal movements: None, normal Patient's awareness of abnormal movements (rate only patient's report): No Awareness, Dental Status Current problems with teeth and/or dentures?: No Does patient usually wear dentures?: No  CIWA:  CIWA-Ar Total: 0 COWS:  COWS Total Score: 0  Assessment: Patient is a 44 year old CM with severe anxiety ,depression as well as psychosis, continues was delusional and suicidal with plan to use a firearm to kill self until today.  Treatment Plan Summary: Daily contact with patient to assess and evaluate symptoms and progress in treatment and Medication management:  1. Continue crisis management, mood stabilization & relapse prevention.. 2. Continue current medication management to reduce current symptoms to base line and improve the  patient's overall level of functioning; Continue Luvox 75 mg for depression, Haldol 5 mg for mood control, Clonazepam 1 mg for anxiety, Cogentin 0.5 mg for EPS, Neurontin 600 mg for agitation, Prazosin 1 mg for nightmares, Hydroxyzine 25 mg for anxiety & Trazodone 150 mg Q hs. 3. Treat health problems as indicated; Continue colace for constipation, Pepcid for acid reflux 4. Develop treatment plan to enhance medication adeherance upon discharge and the need for  readmission. 5. Psycho-social education regarding relapse prevention and self care.  Medical Decision Making:  Review of Psycho-Social Stressors (1), Established Problem, Worsening (2), Review of Last Therapy Session (1), Review of Medication Regimen & Side Effects (2) and Review of New Medication or Change  in Dosage (2)  Encarnacion Slates, PMHNP, FNP-BC 05/24/2014, 3:00 PM  I agreed with the findings, treatment and disposition plan of this patient. Berniece [name], MD

## 2014-05-24 NOTE — [facility] Group Notes (Signed)
[facility] Group Notes:  (Nursing/MHT/Case Management/Adjunct)  Date:  05/24/2014  Time:  3:48 PM  Type of Therapy:  Psychoeducational Skills  Participation Level:  Did Not Attend  Participation Quality:  Did Not Attend  Affect:  Did Not Attend  Cognitive:  Did Not Attend  Insight:  None  Engagement in Group:  Did Not Attend  Modes of Intervention:  Did Not Attend  Summary of Progress/Problems: Pt did attend patient self inventory group.   Phillip BalintForrest, Phillip [name] 05/24/2014, 3:48 PM

## 2014-05-25 MED ORDER — FLUVOXAMINE MALEATE 100 MG PO TABS
100.0000 mg | ORAL_TABLET | Freq: Two times a day (BID) | ORAL | Status: DC
  Administered 2014-05-25 – 2014-05-26 (×2): 100 mg via ORAL
  Filled 2014-05-25 (×6): qty 1

## 2014-05-25 NOTE — Clinical Social Work Note (Signed)
[facility] who report that patient remains on the wait list.  Phillip BruinKristin Jovon Winterhalter, MSW, [facility]trCSWA Clinical Social Worker [facility] [phone]

## 2014-05-25 NOTE — Progress Notes (Signed)
[name], male   DOB: December 20, 1970, 44 y.o.   MRN: [id number] [name], male   DOB: Apr 04, 1970, 44 y.o.   MRN: [id number] [facility] MD Progress Note  05/25/2014 12:52 PM Phillip [name]  MRN:  [id number]  Subjective: Phillip [name] states; "I'm doing better today. I feel improved."  Objective; Patient seen and chart reviewed. Patient discussed with treatment team. Patient presented with intense SI as well as plan to shoot self with a pistol. Patient did not want to reveal where he had hidden his pistol and appeared to be suicidal chronically with a plan to die as soon as he gets discharged. However I have reviewed progress notes from the week end and it looks like patient has been making some progress. Patient today seen with a pleasant affect, reports depressive sx as improving . Patient does have AH ,but they are coming down. Patient denies SI today and is willing to give details of the friend who has hidden his pistol for him. Discussed this with CSW -who will contact this friend . Patient does report feeling tired on the medications, however he does not want to have any medication changes to address it since he feels they are helping him at this point. Patient encouraged to tae medications and attend groups. Patient continues to be withdrawn mostly ,does attend some groups, missed it this AM.   Principal Problem: MDD (major depressive disorder), recurrent, severe, with psychosis Diagnosis:   Primary Psychiatric Diagnosis: MDD ,recurrent ,severe with psychosis   Secondary Psychiatric Diagnosis: PTSD Generalized anxiety disorder Cannabis use disorder,moderate  Non Psychiatric Diagnosis:  Patient Active Problem List   Diagnosis Date Noted  . Panic disorder [F41.0] 05/19/2014  . MDD (major depressive disorder), recurrent, severe, with psychosis [F33.3] 05/18/2014  . PTSD (post-traumatic stress disorder) [F43.10] 01/29/2014  . Generalized anxiety disorder [F41.1] 06/19/2011     Class: Chronic   Total Time spent with patient: 30 minutes  Past Medical History:  Past Medical History  Diagnosis Date  . Anxiety   . Depression     Past Surgical History  Procedure Laterality Date  . Cholesterol     Family History:  Family History  Problem Relation Age of Onset  . Cancer Mother   . Cancer Father   . Depression Father    Social History:  History  Alcohol Use No     History  Drug Use  . Yes  . Special: Marijuana    History   Social History  . Marital Status: Married    Spouse Name: N/A  . Number of Children: N/A  . Years of Education: N/A   Social History Main Topics  . Smoking status: Current Every Day Smoker -- 1.00 packs/day for 19 years    Types: Cigarettes  . Smokeless tobacco: Not on file  . Alcohol Use: No  . Drug Use: Yes    Special: Marijuana  . Sexual Activity: Yes    Birth Control/ Protection: None   Other Topics Concern  . None   Social History Narrative   Additional History:    Sleep: Good  Appetite:  Good  Musculoskeletal: Strength & Muscle Tone: within normal limits Gait & Station: normal Patient leans: N/A  Psychiatric Specialty Exam: Physical Exam  Review of Systems  Psychiatric/Behavioral: Positive for depression and hallucinations. The patient is nervous/anxious.     Blood pressure 136/91, pulse 90, temperature 97.8 F (36.6 C), temperature source Oral, resp. rate 18, height _0  (1.778 m), weight 73.936  kg (163 lb), SpO2 97 %.Body mass index is 23.39 kg/(m^2).  General Appearance: Fairly Groomed  Engineer, water::  Minimal  Speech:  Slow  Volume:  Normal  Mood:  Anxious, Depressed and Irritable IMPROVING  Affect:  Congruent  Thought Process:  MORE ORGANIZED  Orientation:  Full (Time, Place, and Person)  Thought Content:  Delusions, Hallucinations: Auditory, Paranoid Ideation and Rumination IMPROVING  Suicidal Thoughts:  No PATIENT HAS A PISTOL HIDDEN ,AND WANTED TO USE IT ON DISCHARGE, HOWEVER TODAY  HE IS WILLING TO GIVE DETAILS ABOUT IT, CSW TO WORK ON THIS   Homicidal Thoughts:  No  Memory:  Immediate;   Fair Recent;   Fair Remote;   Fair  Judgement:  Impaired  Insight:  Lacking  Psychomotor Activity:  Normal  Concentration:  Poor  Recall:  [employer] of Knowledge:Fair  Language: Fair  Akathisia:  No  Handed:  Right  AIMS (if indicated):     Assets:  Communication Skills  ADL's:  Intact  Cognition: WNL  Sleep:  Number of Hours: 6.5   Current Medications: Current Facility-Administered Medications  Medication Dose Route Frequency Provider Last Rate Last Dose  . alum & mag hydroxide-simeth (MAALOX/MYLANTA) 200-200-20 MG/5ML suspension 30 mL  30 mL Oral Q4H PRN Lurena Nida, NP   30 mL at 05/21/14 1152  . haloperidol (HALDOL) tablet 5 mg  5 mg Oral BH-q8a2phs [place]a Hedman, MD   5 mg at 05/25/14 0800   And  . benztropine (COGENTIN) tablet 0.5 mg  0.5 mg Oral BH-q8a2phs Ursula Alert, MD   0.5 mg at 05/25/14 0759  . calcium carbonate (TUMS - dosed in mg elemental calcium) chewable tablet 200 mg of elemental calcium  1 tablet Oral TID PRN Nicholaus Bloom, MD   200 mg of elemental calcium at 05/21/14 1710  . clonazePAM (KLONOPIN) tablet 1 mg  1 mg Oral TID AC Ursula Alert, MD   1 mg at 05/25/14 1141  . docusate sodium (COLACE) capsule 100 mg  100 mg Oral BID Ursula Alert, MD   100 mg at 05/25/14 0759  . famotidine (PEPCID) tablet 20 mg  20 mg Oral BID Nicholaus Bloom, MD   20 mg at 05/25/14 0759  . feeding supplement (ENSURE COMPLETE) (ENSURE COMPLETE) liquid 237 mL  237 mL Oral BID BM Nicholaus Bloom, MD   237 mL at 05/25/14 1141  . fluvoxaMINE (LUVOX) tablet 100 mg  100 mg Oral BID Ursula Alert, MD      . gabapentin (NEURONTIN) capsule 600 mg  600 mg Oral TID Lurena Nida, NP   600 mg at 05/25/14 1141  . hydrOXYzine (ATARAX/VISTARIL) tablet 25 mg  25 mg Oral Q6H PRN Lurena Nida, NP   25 mg at 05/23/14 1027  . ibuprofen (ADVIL,MOTRIN) tablet 600 mg  600 mg Oral Q6H PRN  Laverle Hobby, PA-C   600 mg at 05/21/14 1322  . magnesium hydroxide (MILK OF MAGNESIA) suspension 30 mL  30 mL Oral Daily PRN Lurena Nida, NP      . nicotine (NICODERM CQ - dosed in mg/24 hours) patch 21 mg  21 mg Transdermal Daily Nicholaus Bloom, MD   21 mg at 05/25/14 [phone]  . prazosin (MINIPRESS) capsule 1 mg  1 mg Oral QHS Ursula Alert, MD   1 mg at 05/24/14 2113  . traZODone (DESYREL) tablet 150 mg  150 mg Oral QHS Ursula Alert, MD   150 mg at 05/24/14 2113  Lab Results:  Results for orders placed or performed during the hospital encounter of 05/18/14 (from the past 48 hour(s))  CBC     Status: None   Collection Time: 05/23/14  4:02 PM  Result Value Ref Range   WBC 6.6 4.0 - 10.5 K/uL   RBC 4.72 4.22 - 5.81 MIL/uL   Hemoglobin 15.1 13.0 - 17.0 g/dL   HCT 44.8 39.0 - 52.0 %   MCV 94.9 78.0 - 100.0 fL   MCH 32.0 26.0 - 34.0 pg   MCHC 33.7 30.0 - 36.0 g/dL   RDW 12.5 11.5 - 15.5 %   Platelets 260 150 - 400 K/uL  Basic metabolic panel     Status: Abnormal   Collection Time: 05/23/14  4:02 PM  Result Value Ref Range   Sodium 139 135 - 145 mmol/L   Potassium 4.3 3.5 - 5.1 mmol/L   Chloride 103 96 - 112 mmol/L   CO2 25 19 - 32 mmol/L   Glucose, Bld 144 (H) 70 - 99 mg/dL   BUN 15 6 - 23 mg/dL   Creatinine, Ser 1.04 0.50 - 1.35 mg/dL   Calcium 9.8 8.4 - 10.5 mg/dL   GFR calc non Af Amer 87 (L) >90 mL/min   GFR calc Af Amer >90 >90 mL/min    Comment: (NOTE) The eGFR has been calculated using the CKD EPI equation. This calculation has not been validated in all clinical situations. eGFR's persistently <90 mL/min signify possible Chronic Kidney Disease.    Anion gap 11 5 - 15    Comment: Performed at [facility]  BNP (order ONLY if patient complains of dyspnea/SOB AND you have documented it for THIS visit)     Status: None   Collection Time: 05/23/14  4:02 PM  Result Value Ref Range   B Natriuretic Peptide 20.4 0.0 - 100.0 pg/mL    Comment: Performed at [place] [facility], ED (not at [facility])     Status: None   Collection Time: 05/23/14  4:14 PM  Result Value Ref Range   Troponin i, poc 0.00 0.00 - 0.08 ng/mL   Comment 3            Comment: Due to the release kinetics of cTnI, a negative result within the first hours of the onset of symptoms does not rule out myocardial infarction with certainty. If myocardial infarction is still suspected, repeat the test at appropriate intervals.     Physical Findings: AIMS: Facial and Oral Movements Muscles of Facial Expression: None, normal Lips and Perioral Area: None, normal Jaw: None, normal Tongue: None, normal,Extremity Movements Upper (arms, wrists, hands, fingers): None, normal Lower (legs, knees, ankles, toes): None, normal, Trunk Movements Neck, shoulders, hips: None, normal, Overall Severity Incapacitation due to abnormal movements: None, normal Patient's awareness of abnormal movements (rate only patient's report): No Awareness, Dental Status Current problems with teeth and/or dentures?: No Does patient usually wear dentures?: No  CIWA:  CIWA-Ar Total: 0 COWS:  COWS Total Score: 0  Assessment: Patient is a 44 year old CM with severe anxiety ,depression as well as psychosis, had pistol hidden prior to admission , broke up with girl friend , however has been showing some improvement.[place] referral was faxed since patient had intense SI with plan . If patient improves ,could discharge home.  Treatment Plan Summary: Daily contact with patient to assess and evaluate symptoms and progress in treatment and Medication management:  1. Continue crisis management, mood stabilization &  relapse prevention.. 2. Continue current medication management to reduce current symptoms to base line and improve the  patient's overall level of functioning;  Increase Luvox to 200 mg po daily for depression. Continue Haldol 15 mg for mood control, Clonazepam 1 mg for anxiety, Cogentin 0.5 mg for EPS,  Neurontin 600 mg for agitation, Prazosin 1 mg for nightmares, Hydroxyzine 25 mg for anxiety & Trazodone 150 mg Q hs. CSW will contact the friend and discuss disposition and about the hidden pistol.      Medical Decision Making:  Review of Psycho-Social Stressors (1), Review and summation of old records (2), Review of Last Therapy Session (1), Review of Medication Regimen & Side Effects (2) and Review of New Medication or Change in Dosage (2)  Margaretta Chittum,MD 05/25/2014, 12:52 PM

## 2014-05-25 NOTE — Clinical Social Work Note (Signed)
CSW met with patient individually. Patient reports that he feels much better and denies SI. Patient reports that he does not have access to guns at discharge and that he has 2 friends that have the weapons in their possession. Patient declined to provide the names of these 2 friends but did give CSW permission to contact his girlfriend. He reports that he is feeling hopeful about his medications working and also about gaining employment in the near future. Patient expressed interest in discharging within several days.   Tilden Fossa, MSW, [place] Worker [facility] [phone]

## 2014-05-25 NOTE — [facility] Group Notes (Signed)
Adult Psychoeducational Group Note  Date:  05/25/2014 Time:  11:59 PM  Group Topic/Focus:  Wrap-Up Group:   The focus of this group is to help patients review their daily goal of treatment and discuss progress on daily workbooks.  Participation Level:  Did Not Attend  Participation Quality:  None  Affect:  None  Cognitive:  None  Insight: None  Engagement in Group:  None  Modes of Intervention:  Discussion  Additional Comments:  [name] did not attend group.  Caroll RancherLindsay, Gracia Saggese A 05/25/2014, 11:59 PM

## 2014-05-25 NOTE — Clinical Social Work Note (Signed)
CSW left voicemail for fiance Phillip [name] [phone]. Awaiting return call.  Samuella BruinKristin Burnis Halling, MSW, [employer]CSWA Clinical Social Worker [facility] [phone]

## 2014-05-25 NOTE — [facility] Group Notes (Signed)
[facility] LCSW Aftercare Discharge Planning Group Note  05/25/2014  8:45 AM  Participation Quality: Did Not Attend. Patient invited to participate but declined.  Kejon Feild, MSW, LCSWA Clinical Social Worker [facility] [phone]   

## 2014-05-25 NOTE — [facility] Group Notes (Signed)
[facility] LCSW Group Therapy  05/25/2014 12:50 PM  Type of Therapy:  Group Therapy  Participation Level:  Active  Participation Quality:  Attentive  Affect:  Flat and Depressed   Cognitive:  Oriented  Insight:  Limited  Engagement in Therapy:  Limited  Modes of Intervention:  Confrontation, Discussion, Education, Exploration, Problem-solving, Rapport Building, Socialization and Support  Summary of Progress/Problems: Today's Topic: Overcoming Obstacles. Pt identified obstacles faced currently and processed barriers involved in overcoming these obstacles. Pt identified steps necessary for overcoming these obstacles and explored motivation (internal and external) for facing these difficulties head on. Pt further identified one area of concern in their lives and chose a skill of focus pulled from their "toolbox." Phillip [name] actively listened during today's processing group but did not participate in group discussion unless asked a questions directly by CSW. Phillip [name] shared that his goal is to stay on medication. "When I start feeling better, I tend to stop taking my medication." Phillip [name] and CSW explored the reasons why he stops taking medication when he feels better and ways to avoid this in the future. "I'm going to let my gf be in charge of my medication so that I don't have the opportunity to stop taking them."    Smart, Phillip [name] LCSWA 05/25/2014, 12:50 PM

## 2014-05-25 NOTE — Progress Notes (Signed)
Adult Psychoeducational Group Note  Date:  05/25/2014 Time:  1:00 AM  Group Topic/Focus:  Wrap-Up Group:   The focus of this group is to help patients review their daily goal of treatment and discuss progress on daily workbooks.  Participation Level:  Active  Participation Quality:  Appropriate  Affect:  Appropriate  Cognitive:  Appropriate  Insight: Appropriate  Engagement in Group:  Engaged  Modes of Intervention:  Discussion and Education  Additional Comments:  Pt shared with the group that he was feeling better because he no longer wanted to self destruct. Pt's goal is to become closer to his religion/God. Pt stated his family is his support system.  Phillip MoanJeffers, [name] 05/25/2014, 1:00 AM

## 2014-05-25 NOTE — Progress Notes (Signed)
Patient ID: Trey SailorsSteven [name], male   DOB: Jan 06, 1971, 44 y.o.   MRN: [id number] D: Patient exhibits bright affect today; pleasant and cooperative.  He states, "I feel a lot better today."  He denies any depressive symptoms.  He reports sleeping and eating well.  His goal is to "work on my resume and look for a job."  Patient minimizes any symptoms.  He is attending groups and participating in his treatment. A: Continue to monitor medication management and MD orders.  Safety checks completed every 15 minutes per protocol.  Meet 1:1 with patient to address concerns and offer encouragement. R: Patient's behavior is appropriate to situation.

## 2014-05-25 NOTE — Progress Notes (Signed)
D)  Has been brighter this evening and states he is feeling better.  Stated he had had some problems yesterday with anxiety, but wasn't having those feelings this evening.  Was in his room and stated he wasn't going to group because he didn't have issues with addiction, so encouraged him to go to group for depression.  Stated afterward it had gone well and was appreciative for sending him, was able to talk about his goals, etc.  Has been compliant with meds and programming this evening.  Currently denies thoughts of self harm. A)  Will continue to monitor for safety, continue POC R)  Safety maintained.

## 2014-05-26 MED ORDER — FLUVOXAMINE MALEATE 50 MG PO TABS
150.0000 mg | ORAL_TABLET | Freq: Every evening | ORAL | Status: DC
  Filled 2014-05-26: qty 3

## 2014-05-26 MED ORDER — CLONAZEPAM 0.5 MG PO TABS
0.5000 mg | ORAL_TABLET | Freq: Three times a day (TID) | ORAL | Status: DC
  Administered 2014-05-26 – 2014-05-27 (×3): 0.5 mg via ORAL
  Filled 2014-05-26 (×3): qty 1

## 2014-05-26 MED ORDER — FLUVOXAMINE MALEATE 100 MG PO TABS
100.0000 mg | ORAL_TABLET | Freq: Two times a day (BID) | ORAL | Status: AC
  Administered 2014-05-26: 100 mg via ORAL
  Filled 2014-05-26: qty 1

## 2014-05-26 MED ORDER — FLUVOXAMINE MALEATE 50 MG PO TABS
50.0000 mg | ORAL_TABLET | Freq: Every day | ORAL | Status: DC
  Administered 2014-05-27: 50 mg via ORAL
  Filled 2014-05-26: qty 1
  Filled 2014-05-26: qty 56
  Filled 2014-05-26: qty 1

## 2014-05-26 NOTE — Progress Notes (Signed)
[name], male   DOB: Dec 16, 1970, 44 y.o.   MRN: [id number] [name], male   DOB: Dec 16, 1970, 44 y.o.   MRN: [id number] [facility]  05/26/2014 1:38 PM Phillip [name] Girtman  MRN:  [id number]  Subjective: Phillip [name] states; "I feel better.'  Objective; Patient seen and chart reviewed. Patient discussed with treatment team. Patient presented with intense SI as well as plan to shoot self with a pistol. Patient did not want to reveal where he had hidden his pistol and appeared to be suicidal chronically with a plan to die as soon as he gets discharged. However patient currently reports feeling much better than the previous days. Patient denies SI/HI today . However he continues to be not willing to give the contact numbers of the friends who may have his pistol since he feels his pistol will be taken away. Patient reports he feels the medications are working and he feels better compared to admission. Patient reports AH as improving , denies any VH. Patient denies any side effects of medications. Patient reports attending group activities as much as he can.    Principal Problem: MDD (major depressive disorder), recurrent, severe, with psychosis Diagnosis:   Primary Psychiatric Diagnosis: MDD ,recurrent ,severe with psychosis   Secondary Psychiatric Diagnosis: PTSD Generalized anxiety disorder Cannabis use disorder,moderate  Non Psychiatric Diagnosis:  Patient Active Problem List   Diagnosis Date Noted  . Panic disorder [F41.0] 05/19/2014  . MDD (major depressive disorder), recurrent, severe, with psychosis [F33.3] 05/18/2014  . PTSD (post-traumatic stress disorder) [F43.10] 01/29/2014  . Generalized anxiety disorder [F41.1] 06/19/2011    Class: Chronic   Total Time spent with patient: 30 minutes  Past Medical History:  Past Medical History  Diagnosis Date  . Anxiety   . Depression     Past Surgical History  Procedure Laterality Date  . Cholesterol      Family History:  Family History  Problem Relation Age of Onset  . Cancer Mother   . Cancer Father   . Depression Father    Social History:  History  Alcohol Use No     History  Drug Use  . Yes  . Special: Marijuana    History   Social History  . Marital Status: Married    Spouse Name: N/A  . Number of Children: N/A  . Years of Education: N/A   Social History Main Topics  . Smoking status: Current Every Day Smoker -- 1.00 packs/day for 19 years    Types: Cigarettes  . Smokeless tobacco: Not on file  . Alcohol Use: No  . Drug Use: Yes    Special: Marijuana  . Sexual Activity: Yes    Birth Control/ Protection: None   Other Topics Concern  . None   Social History Narrative   Additional History:    Sleep: Good  Appetite:  Good  Musculoskeletal: Strength & Muscle Tone: within normal limits Gait & Station: normal Patient leans: N/A  Psychiatric Specialty Exam: Physical Exam  ROS  Blood pressure 132/92, pulse 84, temperature 98.2 F (36.8 C), temperature source Oral, resp. rate 18, height 5\' 10"  (1.778 m), weight 73.936 kg (163 lb), SpO2 97 %.Body mass index is 23.39 kg/(m^2).  General Appearance: Fairly Groomed  Patent attorneyye Contact::  Minimal  Speech:  Slow  Volume:  Normal  Mood:  Anxious, Depressed and Irritable IMPROVING  Affect:  Congruent  Thought Process:  MORE ORGANIZED  Orientation:  Full (Time, Place, and Person)  Thought Content:  Delusions, Hallucinations: Auditory, Paranoid Ideation and Rumination IMPROVING  Suicidal Thoughts:  No PATIENT HAS A PISTOL HIDDEN ,AND WANTED TO USE IT ON DISCHARGE, patient reports he does not want the pistol taken away and hence will not give the contact information of his friends who has it.  Homicidal Thoughts:  No  Memory:  Immediate;   Fair Recent;   Fair Remote;   Fair  Judgement:  Impaired  Insight:  Lacking  Psychomotor Activity:  Normal  Concentration:  Fair  Recall:  [employer] of Knowledge:Fair   Language: Fair  Akathisia:  No  Handed:  Right  AIMS (if indicated):     Assets:  Communication Skills  ADL's:  Intact  Cognition: WNL  Sleep:  Number of Hours: 6.75   Current Medications: Current Facility-Administered Medications  Medication Dose Route Frequency Provider Last Rate Last Dose  . alum & mag hydroxide-simeth (MAALOX/MYLANTA) 200-200-20 MG/5ML suspension 30 mL  30 mL Oral Q4H PRN Kristeen Mans, NP   30 mL at 05/21/14 1152  . haloperidol (HALDOL) tablet 5 mg  5 mg Oral BH-q8a2phs Chaka Jefferys, MD   5 mg at 05/26/14 0742   And  . benztropine (COGENTIN) tablet 0.5 mg  0.5 mg Oral BH-q8a2phs Jomarie Longs, MD   0.5 mg at 05/26/14 0742  . calcium carbonate (TUMS - dosed in mg elemental calcium) chewable tablet 200 mg of elemental calcium  1 tablet Oral TID PRN Rachael Fee, MD   200 mg of elemental calcium at 05/21/14 1710  . clonazePAM (KLONOPIN) tablet 0.5 mg  0.5 mg Oral TID AC Joyell Emami, MD      . docusate sodium (COLACE) capsule 100 mg  100 mg Oral BID Jomarie Longs, MD   100 mg at 05/19/14 1654  . famotidine (PEPCID) tablet 20 mg  20 mg Oral BID Rachael Fee, MD   20 mg at 05/26/14 0743  . feeding supplement (ENSURE COMPLETE) (ENSURE COMPLETE) liquid 237 mL  237 mL Oral BID BM Rachael Fee, MD   237 mL at 05/26/14 1100  . fluvoxaMINE (LUVOX) tablet 100 mg  100 mg Oral BID Jomarie Longs, MD      . Melene Muller ON 05/27/2014] fluvoxaMINE (LUVOX) tablet 150 mg  150 mg Oral QPM Jomarie Longs, MD      . Melene Muller ON 05/27/2014] fluvoxaMINE (LUVOX) tablet 50 mg  50 mg Oral Q breakfast Keionna Kinnaird, MD      . gabapentin (NEURONTIN) capsule 600 mg  600 mg Oral TID Kristeen Mans, NP   600 mg at 05/26/14 1059  . hydrOXYzine (ATARAX/VISTARIL) tablet 25 mg  25 mg Oral Q6H PRN Kristeen Mans, NP   25 mg at 05/25/14 1433  . ibuprofen (ADVIL,MOTRIN) tablet 600 mg  600 mg Oral Q6H PRN Kerry Hough, PA-C   600 mg at 05/21/14 1322  . magnesium hydroxide (MILK OF MAGNESIA) suspension 30  mL  30 mL Oral Daily PRN Kristeen Mans, NP      . nicotine (NICODERM CQ - dosed in mg/24 hours) patch 21 mg  21 mg Transdermal Daily Rachael Fee, MD   21 mg at 05/26/14 [phone]  . prazosin (MINIPRESS) capsule 1 mg  1 mg Oral QHS Jomarie Longs, MD   1 mg at 05/25/14 2241  . traZODone (DESYREL) tablet 150 mg  150 mg Oral QHS Jomarie Longs, MD   150 mg at 05/25/14 2241   Lab Results:  No results found for this  or any previous visit (from the past 48 hour(s)).  Physical Findings: AIMS: Facial and Oral Movements Muscles of Facial Expression: None, normal Lips and Perioral Area: None, normal Jaw: None, normal Tongue: None, normal,Extremity Movements Upper (arms, wrists, hands, fingers): None, normal Lower (legs, knees, ankles, toes): None, normal, Trunk Movements Neck, shoulders, hips: None, normal, Overall Severity Incapacitation due to abnormal movements: None, normal Patient's awareness of abnormal movements (rate only patient's report): No Awareness, Dental Status Current problems with teeth and/or dentures?: No Does patient usually wear dentures?: No  CIWA:  CIWA-Ar Total: 0 COWS:  COWS Total Score: 0  Assessment: Patient is a 44 year old CM with severe anxiety ,depression as well as psychosis, had pistol hidden prior to admission , broke up with girl friend , however has been showing some improvement.[facility] referral was faxed since patient had intense SI with plan . If patient improves ,could discharge home.Patient currently showing improvement compared to admission. Patient however is still not willing to give information about the friends who may have his pistol.  Treatment Plan Summary: Daily contact with patient to assess and evaluate symptoms and progress in treatment and Medication management:  1. Continue crisis management, mood stabilization & relapse prevention.. 2. Continue current medication management to reduce current symptoms to base line and improve the  patient's overall  level of functioning;  Continue Luvox  200 mg po daily for depression. Continue Haldol 15 mg for mood control, Clonazepam 1 mg for anxiety, Cogentin 0.5 mg for EPS, Neurontin 600 mg for agitation, Prazosin 1 mg for nightmares, Hydroxyzine 25 mg for anxiety & Trazodone 150 mg Q hs. Will reduce Klonopin 0.5 mg po tid . Plan to taper off once he is more stable on Luvox. CSW will contact the friend and discuss disposition and about the hidden pistol.      Medical Decision Making:  Review of Psycho-Social Stressors (1), Review and summation of old records (2), Review of Last Therapy Session (1), Review of Medication Regimen & Side Effects (2) and Review of New Medication or Change in Dosage (2)  Aretta Stetzel,MD 05/26/2014, 1:38 PM

## 2014-05-26 NOTE — Plan of Care (Signed)
Problem: Ineffective individual coping Goal: STG: Patient will remain free from self harm Outcome: Progressing Patient reports a significant decrease in suicidal ideation.

## 2014-05-26 NOTE — Progress Notes (Signed)
Pt alert and cooperative. Affect/ mood anxious, depressed and labile but brightens on approach. "I feel like I can accomplish anything now, I feel great". -SI/HI, -A/vhall, verbally contracts for safety. Denies pain or physical discomfort.  Visible in dayroom, minimum interaction with peers. Emotional support and encouragement given. Will continue to monitor closely and evaluate for stabilization.

## 2014-05-26 NOTE — [facility] Group Notes (Signed)
[facility] Group Notes:  (Nursing/MHT/Case Management/Adjunct)  Date:  05/26/2014  Time: 0900 am  Type of Therapy:  Psychoeducational Skills  Participation Level:  Did Not Attend    Phillip [name] 05/26/2014, 9:54 AM 

## 2014-05-26 NOTE — Clinical Social Work Note (Signed)
CSW and patient together contacted patient's girlfriend Marchelle Folksmanda. She reports that she has noticed some improvements in his symptoms since admission. She remains concerned regarding patient's medication compliance. Patient verbalized his understanding that compliance is important in managing his symptoms. CSW informed patient and girlfriend that samples and prescriptions would be provided at discharge. Girlfriend was agreeable to continue assisting in providing support with regards to medication compliance. Girlfriend reports that there are no guns in the home at this time. After phone call with girlfriend, CSW educated patient on importance of taking medications as prescribed and working with his PCP to manage his medications. Patient reports that he thinks his haldol is causing blurry vision and shortness of breath- CSW agreed to share information with MD. Patient reports that he feels safe for discharge and states that he does not have access to guns at this time- states that he gave his guns away to 2 of his friends and is unable to get them back. Patient verbalized his hope to discharge in the near future.  Samuella BruinKristin Tyriana Helmkamp, MSW, [employer]CSWA Clinical Social Worker [facility] [phone]

## 2014-05-26 NOTE — Progress Notes (Signed)
D: Pt observed asleep in his room for the majority of the evening. Pt was complaint with taking his scheduled medications. Pt presented flat in affect and depressed in mood. Pt denied any SI/HI/AVH. Pt reports feeling "tired". Pt denied any concerns he wished for this writer to address at this time. Gatorade given per pt's request. A: Writer administered scheduled medications to pt, per MD orders. Continued support and availability as needed was extended to this pt. Staff continue to monitor pt with q7715min checks.  R: No adverse drug reactions noted. Pt receptive to treatment. Pt remains safe at this time.

## 2014-05-26 NOTE — Tx Team (Signed)
Interdisciplinary Treatment Plan Update (Adult)  Date:  05/26/2014 Time Reviewed:  9:13 AM  Progress in Treatment: Attending groups: Yes Participating in groups: Yes Taking medication as prescribed: Yes Tolerating medication:  Yes Family/Significant othe contact made:  Yes, CSW has spoken with girlfriend Patient understands diagnosis:  Yes Discussing patient identified problems/goals with staff:  No., patient flat affect, contracts for safety on unit. Medical problems stabilized or resolved:  No. MD continuing to assess Denies suicidal/homicidal ideation: Yes, patient denies SI/HI Issues/concerns per patient self-inventory:  No. Other:  New problem(s) identified: No, Describe:  Recent admission  Discharge Plan or Barriers:   2/23: Active suicidal ideation, purchase of gun which patient states is hidden, patient has researched suicide methods including overdose.    2/26: Patient continues to have active suicidal ideation, purchase of gun which patient states is hidden, patient has researched suicide methods including overdose.  [facility] referral made on 2/25.\  3/1: Per patient self-report, his symptoms are improving. Patient has been attending groups regularly and is active in group discussion. Patient is hopeful to return home at discharge to resume outpatient services.   Reason for Continuation of Hospitalization:  Suicidal ideation Depression  Comments:  Estimated length of stay:  1-2 days  New goal(s): Medication stabilization, decrease in SI, increase in coping skills  Review of initial/current patient goals per problem list:    [age] yr male who presents VC in no acute distress for the treatment of SI and Depression. Pt appears flat and depressed. Pt was calm and cooperative with admission process. Pt presents with active SI and contracts for safety upon admission. Pt +ve AVH Mainly at night "nightmares". Pt stated he stopped taking his medications, but don't know why. Pt said  [facility] Pt lost his job  Patient is reported to be veteran suffering from PTSD, patient denies service connection.  States he is burden to fiance and chlidren, has purchased gun and hidden outside his residence.  Contracts for safety on unit only.  Is not compliant w outpatient treatment and medications at present - discontinued treatment several weeks ago for unclear reasons.    Current medications:  Gabapentin 600 mg TID, quetiapine 200 mg QHS, trazodone 50mg  QHS.   Attendees: Patient:    Family:    Physician: Dr. Jama Flavorsobos; Dr. Dub MikesLugo 05/26/2014 9:30 AM  Nursing: Burnetta SabinBrittany Tyson, Lendell CapriceBrittany Guthrie, RN 05/26/2014 9:30 AM  Clinical Social Worker: Samuella BruinKristin Adell Koval,  LCSWA 05/26/2014 9:30 AM  Other: Juline PatchQuylle Hodnett, LCSW 05/26/2014 9:30 AM  Other: Trula SladeHeather Smart, LCSWA 05/26/2014 9:30 AM  Other: Onnie BoerJennifer Clark, Case Manager 05/26/2014 9:30 AM  Other:  05/26/2014 9:30 AM     Scribe for Treatment Team:   Johny Chessrinkard, Chereese Cilento L, 05/26/2014, 9:13 AM

## 2014-05-26 NOTE — Progress Notes (Signed)
Recreation Therapy Notes  Animal-Assisted Activity/Therapy (AAA/T) Program Checklist/Progress Notes Patient Eligibility Criteria Checklist & Daily Group note for Rec Tx Intervention  Date: 03.01.2016 Time: 2:45pm [facility]: [address] [facility] [facility]   AAA/T Program Assumption of Risk Form signed by Patient/ or Parent Legal Guardian yes  Patient is free of allergies or sever asthma yes  Patient reports no fear of animals yes  Patient reports no history of cruelty to animals yes  Patient understands his/her participation is voluntary yes  Patient washes hands before animal contact yes  Patient washes hands after animal contact yes  Behavioral Response: Appropriate   Education: Hand Washing, Appropriate Animal Interaction   Education Outcome: Acknowledges education.   Clinical Observations/Feedback: Patient interacted appropriately with therapy dog, handler and peers during session.   Marykay Lexenise L Orlen Leedy, LRT/CTRS  Jearl KlinefelterBlanchfield, Zaylei Mullane L 05/26/2014 7:39 PM

## 2014-05-26 NOTE — Progress Notes (Signed)
[name], male   DOB: 09-Aug-1970, 44 y.o.   MRN: [id number] D: Patient has not been attending groups today.  He elects to stay in his room and isolate.  He has minimal interaction with staff and his peers.  He denies any depressive symptoms.  He rates his anxiety as a 2.  He denies SI/HI/AVH.  His goal is to be "discharged."  Per social worker, patient will not reveal the location of the pistol he bought.  He denies any physical pain and has not voiced any complaints.   A: Continue to monitor medication management and MD orders.  Safety checks completed every 15 minutes per protocol.  Meet 1:1 with patient to discuss concerns and offer encouragement. R: Patient's behavior is appropriate to situation.

## 2014-05-26 NOTE — [facility] Group Notes (Signed)
[facility] LCSW Group Therapy  05/26/2014   1:15 PM   Type of Therapy:  Group Therapy  Participation Level:  Active  Participation Quality:  Attentive, Sharing and Supportive  Affect:  Appropriate  Cognitive:  Alert and Oriented  Insight:  Developing/Improving and Engaged  Engagement in Therapy:  Developing/Improving and Engaged  Modes of Intervention:  Clarification, Confrontation, Discussion, Education, Exploration, Limit-setting, Orientation, Problem-solving, Rapport Building, Dance movement psychotherapisteality Testing, Socialization and Support  Summary of Progress/Problems: The topic for group therapy was feelings about diagnosis.  Pt actively participated in group discussion on their past and current diagnosis and how they feel towards this.  Pt also identified how society and family members judge them, based on their diagnosis as well as stereotypes and stigmas.  Patient reports "feeling left behind" due to his mental illness. Group engaged in discussion of false appearances and the negative consequences of comparing ourselves to others. CSW and other group members provided patient with emotional support and encouragement. Patient engaged in guided visual activity at end of group.  Samuella BruinKristin Shenice Dolder, MSW, [employer]CS[employer] Clinical Social Worker [facility] [phone]

## 2014-05-27 ENCOUNTER — Encounter (HOSPITAL_COMMUNITY): Payer: Self-pay | Admitting: Registered Nurse

## 2014-05-27 MED ORDER — FLUVOXAMINE MALEATE 50 MG PO TABS: ORAL_TABLET | ORAL | Status: DC

## 2014-05-27 MED ORDER — HYDROXYZINE HCL 25 MG PO TABS: 25.0000 mg | ORAL_TABLET | Freq: Four times a day (QID) | ORAL | Status: DC | PRN

## 2014-05-27 MED ORDER — CLONAZEPAM 0.5 MG PO TABS: 0.5000 mg | ORAL_TABLET | Freq: Three times a day (TID) | ORAL | Status: DC

## 2014-05-27 MED ORDER — DOCUSATE SODIUM 100 MG PO CAPS: 100.0000 mg | ORAL_CAPSULE | Freq: Two times a day (BID) | ORAL | Status: DC

## 2014-05-27 MED ORDER — FAMOTIDINE 20 MG PO TABS: 20.0000 mg | ORAL_TABLET | Freq: Two times a day (BID) | ORAL | Status: DC

## 2014-05-27 MED ORDER — GABAPENTIN 600 MG PO TABS
600.0000 mg | ORAL_TABLET | Freq: Three times a day (TID) | ORAL | Status: DC
  Filled 2014-05-27 (×3): qty 42

## 2014-05-27 MED ORDER — GABAPENTIN 300 MG PO CAPS: 600.0000 mg | ORAL_CAPSULE | Freq: Three times a day (TID) | ORAL | Status: DC

## 2014-05-27 MED ORDER — GABAPENTIN 600 MG PO TABS: 600.0000 mg | ORAL_TABLET | Freq: Three times a day (TID) | ORAL | Status: DC

## 2014-05-27 MED ORDER — BENZTROPINE MESYLATE 0.5 MG PO TABS: 0.5000 mg | ORAL_TABLET | ORAL | Status: DC

## 2014-05-27 MED ORDER — PRAZOSIN HCL 1 MG PO CAPS: 1.0000 mg | ORAL_CAPSULE | Freq: Every day | ORAL | Status: DC

## 2014-05-27 MED ORDER — TRAZODONE HCL 150 MG PO TABS: 150.0000 mg | ORAL_TABLET | Freq: Every evening | ORAL | Status: DC | PRN

## 2014-05-27 MED ORDER — HALOPERIDOL 5 MG PO TABS: 5.0000 mg | ORAL_TABLET | ORAL | Status: DC

## 2014-05-27 NOTE — Progress Notes (Signed)
Recreation Therapy Notes  03.02.2016 LRT met with patient 1:1 to introduce and educate patient on three stress management techniques, deep breathing, progressive muscle relaxation and mindfulness. Patient practiced deep breathing and progressive muscle relaxation with LRT. Patient receptive to both techniques and demonstrated ability to practice independently. Patient encouraged to shared techniques with girlfriend post d/c, patient receptive to concept, highlighting she could help him remember to use techniques in high stress situations. LRT counseled patient on benefit of using techniques on daily basis to help reduce overall stress level, patient receptive.   Laureen Ochs Shakeita Vandevander, LRT/CTRS  Myrikal Messmer L 05/27/2014 9:22 AM

## 2014-05-27 NOTE — Clinical Social Work Note (Signed)
CSW met with patient to discuss discharge plans. Patient reports feeling well today and was observed smiling. He states that his girlfriend will be providing transportation this afternoon. Patient encouraged to return to hospital in the future if he begins to feel suicidal in the future, patient agreeable.  Tilden Fossa, MSW, [place] Worker [facility] [phone]

## 2014-05-27 NOTE — [facility] Suicide Risk Assessment (Signed)
[facility]   Demographic Factors:  Male and Caucasian  Total Time spent with patient: 30 minutes  Musculoskeletal: Strength & Muscle Tone: within normal limits Gait & Station: normal Patient leans: N/A  Psychiatric Specialty Exam: Physical Exam  Review of Systems  Psychiatric/Behavioral: Positive for depression (stable). The patient is nervous/anxious (stable).     Blood pressure 114/87, pulse 104, temperature 97.7 F (36.5 C), temperature source Oral, resp. rate 18, height 5\' 10"  (1.778 m), weight 73.936 kg (163 lb), SpO2 97 %.Body mass index is 23.39 kg/(m^2).  General Appearance: Casual  Eye Contact::  Fair  Speech:  Clear and Coherent409  Volume:  Normal  Mood:  Anxious and Depressedstable  Affect:  Congruent  Thought Process:  Coherent  Orientation:  Full (Time, Place, and Person)  Thought Content:  WDL  Suicidal Thoughts:  No  Homicidal Thoughts:  No  Memory:  Immediate;   Fair Recent;   Fair Remote;   Fair  Judgement:  Fair  Insight:  Fair  Psychomotor Activity:  Normal  Concentration:  Fair  Recall:  FiservFair  Fund of Knowledge:Fair  Language: Fair  Akathisia:  No  Handed:  Right  AIMS (if indicated):     Assets:  Communication Skills Desire for Improvement  Sleep:  Number of Hours: 7  Cognition: WNL  ADL's:  Intact   Have you used any form of tobacco in the last 30 days? (Cigarettes, Smokeless Tobacco, Cigars, and/or Pipes): Yes  Has this patient used any form of tobacco in the last 30 days? (Cigarettes, Smokeless Tobacco, Cigars, and/or Pipes) Yes, nicotine patch provided  Mental Status Per Nursing Assessment::   On Admission:     Current Mental Status by Physician: patient denies SI/HI/AH/VH  Loss Factors: Financial problems/change in socioeconomic status  Historical Factors: Impulsivity  Risk Reduction Factors:   Positive social support  Continued Clinical Symptoms:  More than one psychiatric diagnosis Previous  Psychiatric Diagnoses and Treatments  Cognitive Features That Contribute To Risk:  Polarized thinking    Suicide Risk:  Acute risk is Minimal: No identifiable suicidal ideation.  Patient with social support,religious, no access to weapon, has access to treatment,no family hx of suicide. Chronic risk is moderate to high ; due to being white, male, unemployed, hx of suicide attempt, recent SI , impulsivity.  Principal Problem: MDD (major depressive disorder), recurrent, severe, with psychosis Discharge Diagnoses:  Patient Active Problem List   Diagnosis Date Noted  . Panic disorder [F41.0] 05/19/2014  . MDD (major depressive disorder), recurrent, severe, with psychosis [F33.3] 05/18/2014  . PTSD (post-traumatic stress disorder) [F43.10] 01/29/2014  . Generalized anxiety disorder [F41.1] 06/19/2011    Class: Chronic    Follow-up Information    [facility] with [facility]  On 06/04/2014.   Why:  Therapy appointment on Thursday March 10th at 10 am with Rudi RummageSharon Burkett. Please call office if you need to reschedule appointment.   Contact information:   [facility] [address] [place], [place] [address]       Follow up with Dr. Lovenia KimMark Hepler [facility] On 06/05/2014.   Why:  Medication management appointment on Friday March 11th at 1:45 pm with Lovenia KimMark Hepler. Please call office if you need to reschedule appointment.   Contact information:   [address],  [place], [place] [address] [phone]      Plan Of Care/Follow-up recommendations:  Activity:  No restrictions Diet:  regular Tests:  as needed Other:  follow up with after care  Is patient on multiple antipsychotic therapies at discharge:  No   Has Patient had three or more failed trials of antipsychotic monotherapy by history:  No  Recommended Plan for Multiple Antipsychotic Therapies: NA    Phillip Mellinger MD 05/27/2014, 9:23 AM

## 2014-05-27 NOTE — Progress Notes (Signed)
Patient ID: Phillip SailorsSteven Betten, male   DOB: May 14, 1970, 44 y.o.   MRN: [id number]  D: Patient denies any SI or hearing voices at present. Contracts to call for help or come back to the hospital if suicide thoughts start reoccurring. Given suicide prevention information.  A: Obtained all belongings, medications, prescriptions and follow-up appointment. R: Girlfriend to come pick him up.

## 2014-05-27 NOTE — [facility] Group Notes (Signed)
[facility]  05/27/2014  8:45 AM  Participation Quality: Did Not Attend. Patient invited to participate but declined.  Samuella BruinKristin Nikcole Eischeid, MSW, [employer]CSWA Clinical Social Worker [facility] [phone]

## 2014-05-27 NOTE — Progress Notes (Signed)
  [facility] :  Will you be returning to the same living situation after discharge:  Yes, patient plans to return home with his fiance at discharge At discharge, do you have transportation home?: Yes,  patient reports access to transportation Do you have the ability to pay for your medications: Yes,  patient will be provided medication samples and prescriptions at discharge  Release of information consent forms completed and in the chart;  Patient's signature needed at discharge.  Patient to Follow up at: Follow-up Information    Follow up with [facility]  On 06/04/2014.   Why:  Therapy appointment on Thursday March 10th at 10 am with Rudi RummageSharon Burkett. Please call office if you need to reschedule appointment.   Contact information:   [facility] [address] [place], [place] [address]       Follow up with Dr. Lovenia KimMark Hepler [facility] On 06/05/2014.   Why:  Medication management appointment on Friday March 11th at 1:45 pm with Lovenia KimMark Hepler. Please call office if you need to reschedule appointment.   Contact information:   [address],  [place], [place] [address] [phone]) [phone]      Patient denies SI/HI: Yes,  denies    Safety Planning and Suicide Prevention discussed: Yes,  with patient and fiance  Have you used any form of tobacco in the last 30 days? (Cigarettes, Smokeless Tobacco, Cigars, and/or Pipes): Yes  Has patient been referred to the Quitline?: Patient refused referral  Giovana Faciane, [place] CarboKristin L 05/27/2014, 9:38 AM

## 2014-05-27 NOTE — Discharge Summary (Signed)
Physician Discharge Summary Note  Patient:  Phillip [name] is an 44 y.o., male MRN:  [id number] DOB:  09-14-1970 Patient phone:  [phone] (home)  Patient address:   [address] [place] [address],  Total Time spent with patient: Greater than 30 minutes  Date of Admission:  05/18/2014 Date of Discharge: 05/27/2014  Reason for Admission:  Per H&P admission:  Phillip [name] is an 44 y.o. male, single, Caucasian who presents to [facility] ED accompanied by [employer]. Pt reports he has felt very depressed for the past two months with symptoms including daily crying spells, insomnia, nightmare, deceased appetite, fatigue, decreased concentration and feelings of hopelessness. He reports he is having daily panic attacks. He reports sleeping 1-2 hours per night for the past month. He states he has lost approximately 15 lbs over the past two months and only eats once per day. Pt states he has been perseverating on trauma experienced while serving in the [employer] during [employer]. He reports fear of being around other people. Pt states he is a burden on his children and fiancee and feels his only option is to kill himself. He says he has attempted suicide by overdose twice in the past and through online research realized "it is difficult to kill yourself by overdose." Pt state he purchased a pistol for the purpose of killing himself and that he has hidden the gun outside his home so his children cannot access it. Pt reports he sometimes seeing shadows of people and "I hear a voice inside my head saying that it is okay, just kill yourself." Pt states he lost a good job two months ago following his admission to [facility] for mental health treatment following a suicide attempt by overdose. He reports living with his fiancee and his three children, ages 3, 69 and 13. He states his medications are prescribed by his primary care physician Alfonso Ramus. Pt reports he stopped taking his medications a few  weeks ago "because I just didn't feel like taking them." He reports being hospitalized at [facility] and [facility] in the past but does not want to return to [facility] because he had a bad experience there. Patient states during his psychiatric assessment "I feel something fighting in me to stay alive. I did not to hurt my fiance by killing myself. I hope my thinking gets better. Right now my depression is at a ten." The patient is cooperative but appears very depressed throughout the assessment.   Principal Problem: MDD (major depressive disorder), recurrent, severe, with psychosis Discharge Diagnoses: Patient Active Problem List   Diagnosis Date Noted  . Panic disorder [F41.0] 05/19/2014  . MDD (major depressive disorder), recurrent, severe, with psychosis [F33.3] 05/18/2014  . PTSD (post-traumatic stress disorder) [F43.10] 01/29/2014  . Generalized anxiety disorder [F41.1] 06/19/2011    Class: Chronic    Musculoskeletal: Strength & Muscle Tone: within normal limits Gait & Station: normal Patient leans: N/A  Psychiatric Specialty Exam:  See Suicide Risk Assessment Physical Exam  Review of Systems  Psychiatric/Behavioral: Positive for substance abuse. Negative for suicidal ideas and memory loss. Depression: Stable. Hallucinations: Stable. Nervous/anxious: Stable. Insomnia: Stable.     Blood pressure 114/87, pulse 104, temperature 97.7 F (36.5 C), temperature source Oral, resp. rate 18, height  (1.778 m), weight 73.936 kg (163 lb), SpO2 97 %.Body mass index is 23.39 kg/(m^2).    Past Medical History:  Past Medical History  Diagnosis Date  . Anxiety   .  Depression     Past Surgical History  Procedure Laterality Date  . Cholesterol     Family History:  Family History  Problem Relation Age of Onset  . Cancer Mother   . Cancer Father   . Depression Father    Social History:  History  Alcohol Use No     History  Drug Use  . Yes  . Special: Marijuana     History   Social History  . Marital Status: Married    Spouse Name: N/A  . Number of Children: N/A  . Years of Education: N/A   Social History Main Topics  . Smoking status: Current Every Day Smoker -- 1.00 packs/day for 19 years    Types: Cigarettes  . Smokeless tobacco: Not on file  . Alcohol Use: No  . Drug Use: Yes    Special: Marijuana  . Sexual Activity: Yes    Birth Control/ Protection: None   Other Topics Concern  . None   Social History Narrative    Risk to Self: Is patient at risk for suicide?: Yes What has been your use of drugs/alcohol within the last 12 months?: Pt reports he uses clonopin which is prescribed and used THC once in last month but he will not again as he felt paranoid when using Risk to Others:   Prior Inpatient Therapy:   Prior Outpatient Therapy:    Level of Care:  OP  Hospital Course:  Phillip [name] was admitted for MDD (major depressive disorder), recurrent, severe, with psychosis and crisis management.  He was treated discharged with the medications listed below under Medication List.  Medical problems were identified and treated as needed.  Home medications were restarted as appropriate.  Improvement was monitored by observation and Phillip [name] daily report of symptom reduction.  Emotional and mental status was monitored by daily self-inventory reports completed by Phillip [name] and clinical staff.         Phillip [name] was evaluated by the treatment team for stability and plans for continued recovery upon discharge.  Phillip [name] motivation was an integral factor for scheduling further treatment.  Employment, transportation, bed availability, health status, family support, and any pending legal issues were also considered during his hospital stay.  He was offered further treatment options upon discharge including but not limited to Residential, Intensive Outpatient, and Outpatient treatment.  Phillip [name] will follow up with the services as  listed below under Follow Up Information.     Upon completion of this admission the patient was both mentally and medically stable for discharge denying suicidal/homicidal ideation, auditory/visual/tactile hallucinations, delusional thoughts and paranoia.      Consults:  psychiatry  Significant Diagnostic Studies:  labs: I-stat troponin, DBC, BMP, BNP, Gabapentin level, UDS, ETOH  Discharge Vitals:   Blood pressure 114/87, pulse 104, temperature 97.7 F (36.5 C), temperature source Oral, resp. rate 18, height  (1.778 m), weight 73.936 kg (163 lb), SpO2 97 %. Body mass index is 23.39 kg/(m^2). Lab Results:   No results found for this or any previous visit (from the past 72 hour(s)).  Physical Findings: AIMS: Facial and Oral Movements Muscles of Facial Expression: None, normal Lips and Perioral Area: None, normal Jaw: None, normal Tongue: None, normal,Extremity Movements Upper (arms, wrists, hands, fingers): None, normal Lower (legs, knees, ankles, toes): None, normal, Trunk Movements Neck, shoulders, hips: None, normal, Overall Severity Severity of abnormal movements (highest score from questions above): None, normal Incapacitation due to abnormal movements: None, normal Patient's  awareness of abnormal movements (rate only patient's report): No Awareness, Dental Status Current problems with teeth and/or dentures?: No Does patient usually wear dentures?: No  CIWA:  CIWA-Ar Total: 0 COWS:  COWS Total Score: 0   See Psychiatric Specialty Exam and Suicide Risk Assessment completed by Attending Physician prior to discharge.  Discharge destination:  Home  Is patient on multiple antipsychotic therapies at discharge:  No   Has Patient had three or more failed trials of antipsychotic monotherapy by history:  No    Recommended Plan for Multiple Antipsychotic Therapies: NA  Discharge Instructions    Activity as tolerated - No restrictions    Complete by:  As directed       Diet - low sodium heart healthy    Complete by:  As directed      Discharge instructions    Complete by:  As directed   Take all of you medications as prescribed by your mental healthcare provider.  Report any adverse effects and reactions from your medications to your outpatient provider promptly. Do not engage in alcohol and or illegal drug use while on prescription medicines. In the event of worsening symptoms call the crisis hotline, 911, and or go to the nearest emergency department for appropriate evaluation and treatment of symptoms. Follow-up with your primary care provider for your medical issues, concerns and or health care needs.   Keep all scheduled appointments.  If you are unable to keep an appointment call to reschedule.  Let the nurse know if you will need medications before next scheduled appointment.            Medication List    STOP taking these medications        NYQUIL PO     pantoprazole 40 MG tablet  Commonly known as:  PROTONIX     QUEtiapine 400 MG tablet  Commonly known as:  SEROQUEL      TAKE these medications      Indication   benztropine 0.5 MG tablet  Commonly known as:  COGENTIN  Take 1 tablet (0.5 mg total) by mouth 3 (three) times daily at 8am, 2pm and bedtime. For drug induced tremors   Indication:  Extrapyramidal Reaction caused by Medications     clonazePAM 0.5 MG tablet  Commonly known as:  KLONOPIN  Take 1 tablet (0.5 mg total) by mouth 3 (three) times daily before meals. For anxiety   Indication:  anxiety     docusate sodium 100 MG capsule  Commonly known as:  COLACE  Take 1 capsule (100 mg total) by mouth 2 (two) times daily. For constipation   Indication:  Constipation     famotidine 20 MG tablet  Commonly known as:  PEPCID  Take 1 tablet (20 mg total) by mouth 2 (two) times daily. For reflux   Indication:  reflux     fluvoxaMINE 50 MG tablet  Commonly known as:  LUVOX  Take 1 tablet (50 mg) every morning with breakfast and 3  tablets every evening with supper for depression   Indication:  Depression     gabapentin 300 MG capsule  Commonly known as:  NEURONTIN  Take 2 capsules (600 mg total) by mouth 3 (three) times daily. For alcohol withdrawal syndrome   Indication:  Alcohol Withdrawal Syndrome, Pain     haloperidol 5 MG tablet  Commonly known as:  HALDOL  Take 1 tablet (5 mg total) by mouth 3 (three) times daily at 8am, 2pm and bedtime. For psychosis  Indication:  Psychosis     hydrOXYzine 25 MG tablet  Commonly known as:  ATARAX/VISTARIL  Take 1 tablet (25 mg total) by mouth every 6 (six) hours as needed for anxiety.   Indication:  Anxiety Neurosis     nicotine 21 mg/24hr patch  Commonly known as:  NICODERM CQ - dosed in mg/24 hours  Place 1 patch (21 mg total) onto the skin daily.   Indication:  Nicotine Addiction     prazosin 1 MG capsule  Commonly known as:  MINIPRESS  Take 1 capsule (1 mg total) by mouth at bedtime. For nightmares   Indication:  nightmares     traZODone 150 MG tablet  Commonly known as:  DESYREL  Take 1 tablet (150 mg total) by mouth at bedtime as needed for sleep.   Indication:  Trouble Sleeping           Follow-up Information    [facility]  On 06/04/2014.   Why:  Therapy appointment on Thursday March 10th at 10 am with Rudi Rummage. Please call office if you need to reschedule appointment.   Contact information:   [facility] [address] [place], [place] [address]       Follow up with Dr. Lovenia Kim [facility] On 06/05/2014.   Why:  Medication management appointment on Friday March 11th at 1:45 pm with Lovenia Kim. Please call office if you need to reschedule appointment.   Contact information:   [address], [place] [address] Phone:(336) [phone]      Follow-up recommendations:  Activity:  As tolerated Diet:  Low sodium  Comments:   Patient has been instructed to  take medications as prescribed; and report adverse effects to outpatient provider.  Follow up with primary doctor for any medical issues and If symptoms recur report to nearest emergency or crisis hot line.    Total Discharge Time: Greater than 30 minutes  Signed: Jaecion Dempster, FNP-BC 05/27/2014, 9:28 AM

## 2014-06-01 NOTE — Progress Notes (Signed)
Patient Discharge Instructions:  After Visit Summary (AVS):   Faxed to:  06/01/14 Discharge Summary Note:   Faxed to:  06/01/14 Psychiatric Admission Assessment Note:   Faxed to:  06/01/14 Suicide Risk Assessment - Discharge Assessment:   Faxed to:  06/01/14 Faxed/Sent to the Next Level Care provider:  06/01/14 [facility] - Dr. Kathe MarinerHelper @ [phone] Faxed to [facility] @ [phone]  Phillip ReddenSheena E [name], 06/01/2014, 3:15 PM

## 2014-06-04 ENCOUNTER — Encounter (HOSPITAL_COMMUNITY): Payer: Self-pay | Admitting: Emergency Medicine

## 2014-06-04 ENCOUNTER — Emergency Department (HOSPITAL_COMMUNITY)
Admission: EM | Admit: 2014-06-04 | Discharge: 2014-06-06 | Disposition: A | Discharge status: Other Acute Inpatient Hospital | Payer: Self-pay

## 2014-06-04 DIAGNOSIS — Z79899 Other long term (current) drug therapy: Secondary | ICD-10-CM | POA: Insufficient documentation

## 2014-06-04 DIAGNOSIS — F411 Generalized anxiety disorder: Secondary | ICD-10-CM | POA: Diagnosis present

## 2014-06-04 DIAGNOSIS — F333 Major depressive disorder, recurrent, severe with psychotic symptoms: Secondary | ICD-10-CM | POA: Diagnosis present

## 2014-06-04 DIAGNOSIS — F4310 Post-traumatic stress disorder, unspecified: Secondary | ICD-10-CM | POA: Diagnosis present

## 2014-06-04 DIAGNOSIS — Z720 Tobacco use: Secondary | ICD-10-CM | POA: Insufficient documentation

## 2014-06-04 DIAGNOSIS — F4001 Agoraphobia with panic disorder: Secondary | ICD-10-CM | POA: Diagnosis present

## 2014-06-04 DIAGNOSIS — R45851 Suicidal ideations: Secondary | ICD-10-CM | POA: Insufficient documentation

## 2014-06-04 HISTORY — DX: Post-traumatic stress disorder, unspecified: F43.10

## 2014-06-04 NOTE — ED Notes (Signed)
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

## 2014-06-05 ENCOUNTER — Encounter (HOSPITAL_COMMUNITY): Payer: Self-pay | Admitting: Emergency Medicine

## 2014-06-05 DIAGNOSIS — R45851 Suicidal ideations: Secondary | ICD-10-CM

## 2014-06-05 DIAGNOSIS — F411 Generalized anxiety disorder: Secondary | ICD-10-CM

## 2014-06-05 DIAGNOSIS — F4310 Post-traumatic stress disorder, unspecified: Secondary | ICD-10-CM

## 2014-06-05 DIAGNOSIS — F410 Panic disorder [episodic paroxysmal anxiety] without agoraphobia: Secondary | ICD-10-CM

## 2014-06-05 DIAGNOSIS — F333 Major depressive disorder, recurrent, severe with psychotic symptoms: Secondary | ICD-10-CM

## 2014-06-05 LAB — COMPREHENSIVE METABOLIC PANEL
ALT: 46 U/L (ref 0–53)
ANION GAP: 8 (ref 5–15)
AST: 19 U/L (ref 0–37)
Albumin: 4.4 g/dL (ref 3.5–5.2)
Alkaline Phosphatase: 82 U/L (ref 39–117)
BUN: 17 mg/dL (ref 6–23)
CALCIUM: 9.5 mg/dL (ref 8.4–10.5)
CO2: 29 mmol/L (ref 19–32)
CREATININE: 0.89 mg/dL (ref 0.50–1.35)
Chloride: 105 mmol/L (ref 96–112)
GFR calc Af Amer: >90 mL/min (ref >90)
GLUCOSE: 98 mg/dL (ref 70–99)
Potassium: 3.6 mmol/L (ref 3.5–5.1)
Sodium: 142 mmol/L (ref 135–145)
Total Bilirubin: 0.8 mg/dL (ref 0.3–1.2)
Total Protein: 7.7 g/dL (ref 6.0–8.3)

## 2014-06-05 LAB — RAPID URINE DRUG SCREEN, HOSP PERFORMED
AMPHETAMINES: NOT DETECTED
BARBITURATES: NOT DETECTED
Benzodiazepines: POSITIVE — AB
Cocaine: NOT DETECTED
Opiates: NOT DETECTED
Tetrahydrocannabinol: POSITIVE — AB

## 2014-06-05 LAB — CBC
HEMATOCRIT: 45.2 % (ref 39.0–52.0)
Hemoglobin: 15.4 g/dL (ref 13.0–17.0)
MCH: 32.4 pg (ref 26.0–34.0)
MCHC: 34.1 g/dL (ref 30.0–36.0)
MCV: 95 fL (ref 78.0–100.0)
Platelets: 249 10*3/uL (ref 150–400)
RBC: 4.76 MIL/uL (ref 4.22–5.81)
RDW: 12.7 % (ref 11.5–15.5)
WBC: 7.5 10*3/uL (ref 4.0–10.5)

## 2014-06-05 LAB — ACETAMINOPHEN LEVEL: Acetaminophen (Tylenol), Serum: <10 ug/mL — ABNORMAL LOW (ref 10–30)

## 2014-06-05 LAB — SALICYLATE LEVEL: Salicylate Lvl: <4 mg/dL (ref 2.8–20.0)

## 2014-06-05 LAB — ETHANOL: Alcohol, Ethyl (B): <5 mg/dL (ref 0–9)

## 2014-06-05 MED ORDER — LORAZEPAM 1 MG PO TABS
1.0000 mg | ORAL_TABLET | Freq: Three times a day (TID) | ORAL | Status: DC | PRN
  Administered 2014-06-05 (×2): 1 mg via ORAL
  Filled 2014-06-05 (×2): qty 1

## 2014-06-05 MED ORDER — TRAZODONE HCL 100 MG PO TABS
200.0000 mg | ORAL_TABLET | Freq: Once | ORAL | Status: AC
  Administered 2014-06-05: 200 mg via ORAL
  Filled 2014-06-05 (×2): qty 2

## 2014-06-05 MED ORDER — FLUVOXAMINE MALEATE 50 MG PO TABS
50.0000 mg | ORAL_TABLET | Freq: Every day | ORAL | Status: DC
  Administered 2014-06-06: 50 mg via ORAL
  Filled 2014-06-05 (×3): qty 1

## 2014-06-05 MED ORDER — LORAZEPAM 1 MG PO TABS
1.0000 mg | ORAL_TABLET | Freq: Three times a day (TID) | ORAL | Status: DC | PRN
  Administered 2014-06-05 – 2014-06-06 (×2): 1 mg via ORAL
  Filled 2014-06-05 (×2): qty 1

## 2014-06-05 MED ORDER — ACETAMINOPHEN 325 MG PO TABS
650.0000 mg | ORAL_TABLET | ORAL | Status: DC | PRN
Start: 2014-06-05 — End: 2014-06-06
  Administered 2014-06-05: 650 mg via ORAL
  Filled 2014-06-05: qty 2

## 2014-06-05 MED ORDER — NICOTINE 21 MG/24HR TD PT24
21.0000 mg | MEDICATED_PATCH | Freq: Every day | TRANSDERMAL | Status: DC
  Administered 2014-06-05 – 2014-06-06 (×2): 21 mg via TRANSDERMAL
  Filled 2014-06-05 (×2): qty 1

## 2014-06-05 MED ORDER — PRAZOSIN HCL 1 MG PO CAPS
1.0000 mg | ORAL_CAPSULE | Freq: Every day | ORAL | Status: DC
  Administered 2014-06-05: 1 mg via ORAL
  Filled 2014-06-05 (×2): qty 1

## 2014-06-05 MED ORDER — FLUVOXAMINE MALEATE 100 MG PO TABS
100.0000 mg | ORAL_TABLET | Freq: Every day | ORAL | Status: DC
  Administered 2014-06-05: 100 mg via ORAL
  Filled 2014-06-05 (×2): qty 1

## 2014-06-05 NOTE — BHH Counselor (Signed)
Per Essie ChristineIjoema Nwaeze, NP pt meets inpt criteria and [facility]. Disposition shared with EDP and with pt personally by Counselor.  Elpidio AnisShari Upstill, PA, has IVC'ed pt due to his threats to leave if inpt is recommended.     Cyndie MullAnna Terriah Reggio, [facility] Triage Specialist

## 2014-06-05 NOTE — ED Notes (Signed)
Pt given sandwich and soda.  

## 2014-06-05 NOTE — BH Assessment (Addendum)
[facility] Assessment Progress Note  The following facilities have been contacted in an effort to place this patient, with results as noted:  Beds available, information faxed, decision pending:  [facility]  No [facility] beds available:  [facility]  No beds available:  [facility]: please call back after 17:00; status may change.  Doylene Canninghomas Tania Perrott, MA Triage Specialist 06/05/2014 @ 16:29
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
## 2014-06-05 NOTE — Consult Note (Signed)
[facility]   Reason for Consult:  Suicidal ideations Referring Physician:  EDP Patient Identification: Phillip [name] MRN:  [id number] Principal Diagnosis: Suicidal ideations Diagnosis:   Patient Active Problem List   Diagnosis Date Noted  . Suicidal ideations [R45.851] 06/05/2014    Priority: High  . Panic disorder [F41.0] 05/19/2014    Priority: High  . MDD (major depressive disorder), recurrent, severe, with psychosis [F33.3] 05/18/2014    Priority: High  . PTSD (post-traumatic stress disorder) [F43.10] 01/29/2014    Priority: High  . Generalized anxiety disorder [F41.1] 06/19/2011    Priority: High    Class: Chronic    Total Time spent with patient: 45 minutes  Subjective:   Phillip SailorsSteven [name] is a 44 y.o. male patient admitted with depression and suicidal ideations with plan to overdose.  HPI:  The patient is depressed with constant suicidal ideations and a plan to overdose, 2 prior attempts.  His depression increased with the recent break-up with his fiance, no other support system.  Denies homicidal ideations, hallucinations, and alcohol abuse.  Marijuana use at times.  [facility] with flashbacks, anxiety, nightmares, and panic attacks.  Hopelessness and worthlessness endorsed. HPI Elements:   Location:  generalized. Quality:  acute. Severity:  severe. Timing:  constnate. Duration:  few days. Context:  break-up with his fiance.  Past Medical History:  Past Medical History  Diagnosis Date  . Anxiety   . Depression   . PTSD (post-traumatic stress disorder)     Past Surgical History  Procedure Laterality Date  . Cholesterol     Family History:  Family History  Problem Relation Age of Onset  . Cancer Mother   . Cancer Father   . Depression Father    Social History:  History  Alcohol Use No     History  Drug Use No    Comment: denies    History   Social History  . Marital Status: Married    Spouse Name: N/A  .  Number of Children: N/A  . Years of Education: N/A   Social History Main Topics  . Smoking status: Current Every Day Smoker -- 1.00 packs/day for 19 years    Types: Cigarettes  . Smokeless tobacco: Not on file  . Alcohol Use: No  . Drug Use: No     Comment: denies  . Sexual Activity: Yes    Birth Control/ Protection: None   Other Topics Concern  . None   Social History Narrative   Additional Social History:    Pain Medications: See PTA List Prescriptions: See PTA List Over the Counter: See PTA List History of alcohol / drug use?: No history of alcohol / drug abuse                     Allergies:  No Known Allergies  Vitals: Blood pressure 125/75, pulse 67, temperature 98.1 F (36.7 C), temperature source Oral, resp. rate 16, SpO2 98 %.  Risk to Self: Suicidal Ideation: Yes-Currently Present Suicidal Intent: Yes-Currently Present Is patient at risk for suicide?: Yes Suicidal Plan?: Yes-Currently Present Specify Current Suicidal Plan: Guns in home Access to Means: Yes Specify Access to Suicidal Means: Guns in the home What has been your use of drugs/alcohol within the last 12 months?: None How many times?: 0 Other Self Harm Risks: Hx of cutting Triggers for Past Attempts: Unknown Intentional Self Injurious Behavior: None Risk to Others: Homicidal Ideation: No Thoughts of Harm to Others: No  Current Homicidal Intent: No Current Homicidal Plan: No Access to Homicidal Means: No History of harm to others?: No Assessment of Violence: None Noted Violent Behavior Description: n/a Does patient have access to weapons?: Yes (Comment) (firearms) Criminal Charges Pending?: No Does patient have a court date: No Prior Inpatient Therapy: Prior Inpatient Therapy: Yes Prior Therapy Dates: 2015-2016 Prior Therapy Facilty/Provider(s): [facility] Reason for Treatment: SI Prior Outpatient Therapy: Prior Outpatient Therapy: No  Current Facility-Administered Medications  Medication  Dose Route Frequency Provider Last Rate Last Dose  . acetaminophen (TYLENOL) tablet 650 mg  650 mg Oral Q4H PRN Elpidio Anis, PA-C   650 mg at 06/05/14 1221  . fluvoxaMINE (LUVOX) tablet 100 mg  100 mg Oral QHS Charm Rings, NP      . fluvoxaMINE (LUVOX) tablet 50 mg  50 mg Oral Daily Charm Rings, NP      . nicotine (NICODERM CQ - dosed in mg/24 hours) patch 21 mg  21 mg Transdermal Daily Elpidio Anis, PA-C   21 mg at 06/05/14 1610  . prazosin (MINIPRESS) capsule 1 mg  1 mg Oral QHS Charm Rings, NP       Current Outpatient Prescriptions  Medication Sig Dispense Refill  . benztropine (COGENTIN) 0.5 MG tablet Take 1 tablet (0.5 mg total) by mouth 3 (three) times daily at 8am, 2pm and bedtime. For drug induced tremors 90 tablet 0  . clonazePAM (KLONOPIN) 0.5 MG tablet Take 1 tablet (0.5 mg total) by mouth 3 (three) times daily before meals. For anxiety 90 tablet 0  . docusate sodium (COLACE) 100 MG capsule Take 1 capsule (100 mg total) by mouth 2 (two) times daily. For constipation 60 capsule 0  . famotidine (PEPCID) 20 MG tablet Take 1 tablet (20 mg total) by mouth 2 (two) times daily. For reflux 60 tablet 0  . fluvoxaMINE (LUVOX) 50 MG tablet Take 1 tablet (50 mg) every morning with breakfast and 3 tablets every evening with supper for depression (Patient taking differently: Take 50-150 mg by mouth 2 (two) times daily. Take 1 tablet (50 mg) every morning with breakfast and 3 tablets every evening with supper for depression) 120 tablet 0  . gabapentin (NEURONTIN) 300 MG capsule Take 2 capsules (600 mg total) by mouth 3 (three) times daily. For alcohol withdrawal syndrome 90 capsule 0  . haloperidol (HALDOL) 5 MG tablet Take 1 tablet (5 mg total) by mouth 3 (three) times daily at 8am, 2pm and bedtime. For psychosis 90 tablet 0  . hydrOXYzine (ATARAX/VISTARIL) 25 MG tablet Take 1 tablet (25 mg total) by mouth every 6 (six) hours as needed for anxiety. 60 tablet 0  . prazosin (MINIPRESS) 1 MG  capsule Take 1 capsule (1 mg total) by mouth at bedtime. For nightmares 30 capsule 0  . traZODone (DESYREL) 150 MG tablet Take 1 tablet (150 mg total) by mouth at bedtime as needed for sleep. 30 tablet 0  . nicotine (NICODERM CQ - DOSED IN MG/24 HOURS) 21 mg/24hr patch Place 1 patch (21 mg total) onto the skin daily. 7 patch 0    Musculoskeletal: Strength & Muscle Tone: within normal limits Gait & Station: normal Patient leans: N/A  Psychiatric Specialty Exam:     Blood pressure 125/75, pulse 67, temperature 98.1 F (36.7 C), temperature source Oral, resp. rate 16, SpO2 98 %.There is no weight on file to calculate BMI.  General Appearance: Casual  Eye Contact::  Fair  Speech:  Normal Rate  Volume:  Normal  Mood:  Depressed  Affect:  Congruent  Thought Process:  Coherent  Orientation:  Full (Time, Place, and Person)  Thought Content:  Rumination  Suicidal Thoughts:  Yes.  with intent/plan  Homicidal Thoughts:  No  Memory:  Immediate;   Fair Recent;   Fair Remote;   Fair  Judgement:  Fair  Insight:  Fair  Psychomotor Activity:  Decreased  Concentration:  Fair  Recall:  [employer] of Knowledge:Good  Language: Good  Akathisia:  No  Handed:  Right  AIMS (if indicated):     Assets:  Leisure Time Physical Health Resilience  ADL's:  Intact  Cognition: WNL  Sleep:      Medical Decision Making: Review of Psycho-Social Stressors (1), Review or order clinical lab tests (1) and Review of Medication Regimen & Side Effects (2)  Treatment Plan Summary: Daily contact with patient to assess and evaluate symptoms and progress in treatment, Medication management and Plan admit to inpatient for stabilization  Plan:  Recommend psychiatric Inpatient admission when medically cleared. Disposition: Eloise Levels, PMH-NP 06/05/2014 3:56 PM Patient seen face-to-face for psychiatric evaluation, chart reviewed and case discussed with the physician extender and developed treatment  plan. Reviewed the information documented and agree with the treatment plan. Thedore Mins, MD

## 2014-06-05 NOTE — Progress Notes (Signed)
Pt presents with depression , +SI w/plan to shoot self. Pt is tearful and reports he has access to a gun. Verbally contracts for safety, inside the hospital only. "I don't want to live anymore, I have no reason, I've done everything I needed to do". "Things are too bad". Pt recently loss job and his fiancee. [facility]. Await evaluation and disposition..Marland Kitchen

## 2014-06-05 NOTE — ED Notes (Signed)
Pt currently resting quietly in bed. Respirations even and unlabored. No acute distress or discomfort noted. Safety has been maintained with q15 minute observation. Will continue current POC 

## 2014-06-05 NOTE — ED Provider Notes (Signed)
CSN: [id number]     Arrival date & time 06/04/14  2344 History   First MD Initiated Contact with Patient 06/05/14 0158     Chief Complaint  Patient presents with  . Suicidal     (Consider location/radiation/quality/duration/timing/severity/associated sxs/prior Treatment) HPI Comments: He presents to the ED voluntarily with complaint of suicidal thoughts, depression. 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 No HI or hallucinations.   The history is provided by the patient. No language interpreter was used.    Past Medical History  Diagnosis Date  . Anxiety   . Depression   . PTSD (post-traumatic stress disorder)    Past Surgical History  Procedure Laterality Date  . Cholesterol     Family History  Problem Relation Age of Onset  . Cancer Mother   . Cancer Father   . Depression Father    History  Substance Use Topics  . Smoking status: Current Every Day Smoker -- 1.00 packs/day for 19 years    Types: Cigarettes  . Smokeless tobacco: Not on file  . Alcohol Use: No    Review of Systems  Constitutional: Negative for fever and chills.  HENT: Negative.   Respiratory: Negative.   Cardiovascular: Negative.   Gastrointestinal: Negative.   Musculoskeletal: Negative.   Skin: Negative.   Neurological: Negative.   Psychiatric/Behavioral: Positive for suicidal ideas and dysphoric mood.      Allergies  Review of patient's allergies indicates no known allergies.  Home Medications   Prior to Admission medications   Medication Sig Start Date End Date Taking? Authorizing Provider  benztropine (COGENTIN) 0.5 MG tablet Take 1 tablet (0.5 mg total) by mouth 3 (three) times daily at 8am, 2pm and bedtime. For drug induced tremors 05/27/14  Yes Shuvon B Rankin, NP  clonazePAM (KLONOPIN) 0.5 MG tablet Take 1 tablet (0.5 mg total) by mouth 3 (three) times daily before meals. For anxiety 05/27/14  Yes Shuvon B Rankin, NP  docusate sodium (COLACE) 100 MG  capsule Take 1 capsule (100 mg total) by mouth 2 (two) times daily. For constipation 05/27/14  Yes Shuvon B Rankin, NP  famotidine (PEPCID) 20 MG tablet Take 1 tablet (20 mg total) by mouth 2 (two) times daily. For reflux 05/27/14  Yes Shuvon B Rankin, NP  fluvoxaMINE (LUVOX) 50 MG tablet Take 1 tablet (50 mg) every morning with breakfast and 3 tablets every evening with supper for depression Patient taking differently: Take 50-150 mg by mouth 2 (two) times daily. Take 1 tablet (50 mg) every morning with breakfast and 3 tablets every evening with supper for depression 05/27/14  Yes Shuvon B Rankin, NP  gabapentin (NEURONTIN) 300 MG capsule Take 2 capsules (600 mg total) by mouth 3 (three) times daily. For alcohol withdrawal syndrome 05/27/14  Yes Shuvon B Rankin, NP  haloperidol (HALDOL) 5 MG tablet Take 1 tablet (5 mg total) by mouth 3 (three) times daily at 8am, 2pm and bedtime. For psychosis 05/27/14  Yes Shuvon B Rankin, NP  hydrOXYzine (ATARAX/VISTARIL) 25 MG tablet Take 1 tablet (25 mg total) by mouth every 6 (six) hours as needed for anxiety. 05/27/14  Yes Shuvon B Rankin, NP  prazosin (MINIPRESS) 1 MG capsule Take 1 capsule (1 mg total) by mouth at bedtime. For nightmares 05/27/14  Yes Shuvon B Rankin, NP  traZODone (DESYREL) 150 MG tablet Take 1 tablet (150 mg total) by mouth at bedtime as needed for sleep. 05/27/14  Yes Talmage Nap, NP  nicotine (NICODERM CQ - DOSED IN MG/24 HOURS) 21 mg/24hr patch Place 1 patch (21 mg total) onto the skin daily. 02/02/14   Adonis BrookSheila Agustin, NP   BP 120/81 mmHg  Pulse 60  Temp(Src) 98.6 F (37 C) (Oral)  Resp 18  SpO2 100% Physical Exam  Constitutional: He is oriented to person, place, and time. He appears well-developed and well-nourished.  HENT:  Head: Normocephalic.  Neck: Normal range of motion. Neck supple.  Cardiovascular: Normal rate and regular rhythm.   Pulmonary/Chest: Effort normal and breath sounds normal.  Abdominal: Soft. Bowel sounds are normal.  There is no tenderness. There is no rebound and no guarding.  Musculoskeletal: Normal range of motion.  Neurological: He is alert and oriented to person, place, and time.  Skin: Skin is warm and dry. No rash noted.  Psychiatric: He has a normal mood and affect.    ED Course  Procedures (including critical care time) Labs Review Labs Reviewed  ACETAMINOPHEN LEVEL - Abnormal; Notable for the following:    Acetaminophen (Tylenol), Serum <10.0 (*)    All other components within normal limits  CBC  COMPREHENSIVE METABOLIC PANEL  ETHANOL  SALICYLATE LEVEL  URINE RAPID DRUG SCREEN (HOSP PERFORMED)    Imaging Review No results found.   EKG Interpretation None      MDM   Final diagnoses:  None    1. Suicidal ideations  Ativan and Nicotine patch ordered per request of patient. Psych orders in place.   He reports recent history of ankle sprain and requests ankle brace. Will provide ankle brace per behavioral health rules.    Elpidio AnisShari Kennedie Pardoe, PA-C 06/05/14 0325  Layla MawKristen N Ward, DO 06/05/14 [id number]

## 2014-06-05 NOTE — ED Notes (Addendum)
Pt currently resting quietly in bed. Respirations even and unlabored. No acute distress or discomfort noted. Safety has been maintained with q15 minute observation. Will continue current POC 

## 2014-06-05 NOTE — ED Notes (Signed)
Pt has been quietly waiting in his bed. He did request prns for pain and anxiety. He reported good relief. Otherwise he has offered no questions or concerns. He has been calm and pleasant. Safety has been maintained with q15 minute observation. Will continue current POC

## 2014-06-06 ENCOUNTER — Inpatient Hospital Stay (HOSPITAL_COMMUNITY)
Admission: AD | Admit: 2014-06-06 | Discharge: 2014-06-23 | DRG: 885 | Disposition: A | Discharge status: Home / Self Care | Payer: Federal, State, Local not specified - Other | Source: Intra-hospital | Attending: Emergency Medicine | Admitting: Emergency Medicine

## 2014-06-06 ENCOUNTER — Encounter (HOSPITAL_COMMUNITY): Payer: Self-pay | Admitting: *Deleted

## 2014-06-06 DIAGNOSIS — K219 Gastro-esophageal reflux disease without esophagitis: Secondary | ICD-10-CM | POA: Diagnosis present

## 2014-06-06 DIAGNOSIS — F4001 Agoraphobia with panic disorder: Secondary | ICD-10-CM | POA: Diagnosis present

## 2014-06-06 DIAGNOSIS — Z9114 Patient's other noncompliance with medication regimen: Secondary | ICD-10-CM | POA: Diagnosis present

## 2014-06-06 DIAGNOSIS — F410 Panic disorder [episodic paroxysmal anxiety] without agoraphobia: Secondary | ICD-10-CM | POA: Diagnosis present

## 2014-06-06 DIAGNOSIS — F411 Generalized anxiety disorder: Secondary | ICD-10-CM | POA: Diagnosis present

## 2014-06-06 DIAGNOSIS — R45851 Suicidal ideations: Secondary | ICD-10-CM | POA: Diagnosis present

## 2014-06-06 DIAGNOSIS — F515 Nightmare disorder: Secondary | ICD-10-CM | POA: Diagnosis present

## 2014-06-06 DIAGNOSIS — R45850 Homicidal ideations: Secondary | ICD-10-CM | POA: Diagnosis not present

## 2014-06-06 DIAGNOSIS — I10 Essential (primary) hypertension: Secondary | ICD-10-CM | POA: Diagnosis present

## 2014-06-06 DIAGNOSIS — F4310 Post-traumatic stress disorder, unspecified: Secondary | ICD-10-CM | POA: Diagnosis present

## 2014-06-06 DIAGNOSIS — G4700 Insomnia, unspecified: Secondary | ICD-10-CM | POA: Diagnosis present

## 2014-06-06 DIAGNOSIS — F329 Major depressive disorder, single episode, unspecified: Secondary | ICD-10-CM | POA: Diagnosis present

## 2014-06-06 DIAGNOSIS — F333 Major depressive disorder, recurrent, severe with psychotic symptoms: Principal | ICD-10-CM | POA: Diagnosis present

## 2014-06-06 DIAGNOSIS — R519 Headache, unspecified: Secondary | ICD-10-CM

## 2014-06-06 DIAGNOSIS — F17210 Nicotine dependence, cigarettes, uncomplicated: Secondary | ICD-10-CM | POA: Diagnosis present

## 2014-06-06 DIAGNOSIS — R51 Headache: Secondary | ICD-10-CM | POA: Diagnosis present

## 2014-06-06 MED ORDER — ENSURE COMPLETE PO LIQD
237.0000 mL | Freq: Two times a day (BID) | ORAL | Status: DC
  Administered 2014-06-06 – 2014-06-23 (×20): 237 mL via ORAL

## 2014-06-06 MED ORDER — CLONAZEPAM 1 MG PO TABS
1.0000 mg | ORAL_TABLET | Freq: Three times a day (TID) | ORAL | Status: DC
  Administered 2014-06-07 – 2014-06-08 (×3): 1 mg via ORAL
  Filled 2014-06-06 (×5): qty 1

## 2014-06-06 MED ORDER — CLONAZEPAM 1 MG PO TABS
ORAL_TABLET | ORAL | Status: AC
  Filled 2014-06-06: qty 1

## 2014-06-06 MED ORDER — MAGNESIUM HYDROXIDE 400 MG/5ML PO SUSP: 30.0000 mL | Freq: Every day | ORAL | Status: DC | PRN

## 2014-06-06 MED ORDER — ALUM & MAG HYDROXIDE-SIMETH 200-200-20 MG/5ML PO SUSP
30.0000 mL | ORAL | Status: DC | PRN
  Administered 2014-06-10 (×2): 30 mL via ORAL
  Filled 2014-06-06 (×2): qty 30

## 2014-06-06 MED ORDER — ACETAMINOPHEN 325 MG PO TABS: 650.0000 mg | ORAL_TABLET | Freq: Four times a day (QID) | ORAL | Status: DC | PRN

## 2014-06-06 MED ORDER — LORAZEPAM 1 MG PO TABS: 1.0000 mg | ORAL_TABLET | Freq: Three times a day (TID) | ORAL | Status: DC | PRN

## 2014-06-06 MED ORDER — NICOTINE 21 MG/24HR TD PT24
21.0000 mg | MEDICATED_PATCH | Freq: Every day | TRANSDERMAL | Status: DC
Start: 2014-06-06 — End: 2014-06-19
  Administered 2014-06-06 – 2014-06-19 (×14): 21 mg via TRANSDERMAL
  Filled 2014-06-06 (×18): qty 1

## 2014-06-06 MED ORDER — ACETAMINOPHEN 325 MG PO TABS: 650.0000 mg | ORAL_TABLET | ORAL | Status: DC | PRN

## 2014-06-06 MED ORDER — MIRTAZAPINE 15 MG PO TABS
15.0000 mg | ORAL_TABLET | Freq: Every day | ORAL | Status: DC
  Administered 2014-06-06 – 2014-06-07 (×2): 15 mg via ORAL
  Filled 2014-06-06 (×5): qty 1

## 2014-06-06 MED ORDER — PRAZOSIN HCL 1 MG PO CAPS
1.0000 mg | ORAL_CAPSULE | Freq: Every day | ORAL | Status: DC
  Administered 2014-06-06 – 2014-06-08 (×3): 1 mg via ORAL
  Filled 2014-06-06 (×6): qty 1

## 2014-06-06 NOTE — H&P (Signed)
Psychiatric Admission Assessment Adult  Patient Identification: Tedric Leeth MRN:  [id number] Date of Evaluation:  06/06/2014 Chief Complaint:  PTSD V Principal Diagnosis: <principal problem not specified> Diagnosis:   Patient Active Problem List   Diagnosis Date Noted  . Suicidal ideations [R45.851] 06/05/2014  . Panic disorder [F41.0] 05/19/2014  . MDD (major depressive disorder), recurrent, severe, with psychosis [F33.3] 05/18/2014  . PTSD (post-traumatic stress disorder) [F43.10] 01/29/2014  . Generalized anxiety disorder [F41.1] 06/19/2011    Class: Chronic   History of Present Illness:: 44 Y/o male D/C from our unit a week ago. States that when he left he went back to his GF. States that things did not work out. They broke up.  States he also  lost his job. States he has nothing left. States that killing himself is "the right thing to do." states he has pushed everyone away has no family left. He admits he went off the medications he was D/C on. Claims side effects to the Haldol The initial assessment is as follows: Lael Pilch is a Caucasian 44 y.o. male who presents to [facility] ED accompanied by [employer]PD, who was not present during assessment. Pt has a hx of PTSD, Anxiety, and Depression dx. He presents with depressed mood and affect, becoming tearful throughout assessment. Eye contact is fair and pt is cooperative and talkative. Thought process is linear and shows no signs of delusional thought content. Speech is soft in tone but coherent. Pt does not appear to be responding to any internal stimuli. Pt actually reports that he is "not depressed" but rather, "at peace and relieved" because he has reportedly made up his mind to end his life. Pt states that he owns a gun and plans to shoot himself; he even admitted to the counselor that he is considering "just lying to the psychiatrists" in order to be d/c from [facility] as soon as possible. He says that he has nothing else to live for and that he has  done everything that he wanted to do in his life. The pt appeared to be at peace with his decision and admitted that the only fear he has about his decision to end his own life would be "going to hell"; however, the pt quickly adds, "I'm ready for it though". Though he denies any current depression, the pt reports increased depressive sx in the past 5 months, including multiple crying spells throughout the day, insomnia, social isolation and "pushing everyone away", deceased appetite, fatigue, lack of motivation, decreased concentration, inappropriate guilt, feelings of hopelessness, and severe suicidal thoughts with plan and intent. Some of the triggers for the pt's increase in sx include recently losing his job and his fiancee.  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 He reports sleeping 1-2 hours per night on average. He states that he only eats about once per day and has lost a substantial amount of weight in recent months. In addition, the pt reports anxiety when in crowds and around other people. Pt reports that no mental health professionals can help him and that he has been in psychiatric hospitals 5 times in the past 5 months. Pt talks about how he has read "every book" on his diagnoses and the various treatments available. He reports attempting suicide at least twice in the past via OD.  Elements:  Location:  depression and anxiety, PTSD  made worst by losing his fiancee and his job after he left here last time. Quality:  unable ot move on, reports suicidal ideas wiht plans. Severity:  severe. Timing:  every day. Duration:  building up has had no relief from the depression and anxiety since he left week ago . Context:  major depression, anxiety PTSD made worst after losing his fiancee and his job with a sense of peace after he decided he is goin gto kill  himself . Associated Signs/Symptoms: Depression Symptoms:  anhedonia, insomnia, fatigue, difficulty concentrating, hopelessness, recurrent thoughts of death, suicidal thoughts with specific plan, anxiety, panic attacks, loss of energy/fatigue, disturbed sleep, (Hypo) Manic Symptoms:  Labiality of Mood, Anxiety Symptoms:  Excessive Worry, Panic Symptoms, Psychotic Symptoms:  Paranoia, PTSD Symptoms: Had a traumatic exposure:  war experiences Re-experiencing:  Flashbacks Intrusive Thoughts Nightmares Hypervigilance:  Yes Total Time spent with patient: 45 minutes  Past Medical History:  Past Medical History  Diagnosis Date  . Anxiety   . Depression   . PTSD (post-traumatic stress disorder)     Past Surgical History  Procedure Laterality Date  . Cholesterol     Family History:  Family History  Problem Relation Age of Onset  . Cancer Mother   . Cancer Father   . Depression Father   Mother and Father depression Social History:  History  Alcohol Use No     History  Drug Use No    Comment: denies    History   Social History  . Marital Status: Married    Spouse Name: N/A  . Number of Children: N/A  . Years of Education: N/A   Social History Main Topics  . Smoking status: Current Every Day Smoker -- 1.00 packs/day for 19 years    Types: Cigarettes  . Smokeless tobacco: Not on file  . Alcohol Use: No  . Drug Use: No     Comment: denies  . Sexual Activity: Yes    Birth Control/ Protection: None   Other Topics Concern  . 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 2 years in the [employer]. Had a lot of trouble  when he came back. States they were given bonuses to get out. States he is married but has not spoken to her in 15 months. He is in another relationship but they broke up.                  Musculoskeletal: Strength & Muscle Tone: within normal limits Gait & Station: normal Patient leans: N/A  Psychiatric Specialty Exam: Physical Exam  Review of Systems  Constitutional: Positive for weight loss and malaise/fatigue.  HENT: Positive for tinnitus.   Eyes: Positive for double vision.  Respiratory:       Smokes a pack   Cardiovascular: Positive for chest pain and palpitations.  Gastrointestinal: Positive for heartburn.  Genitourinary: Negative.   Musculoskeletal: Negative.   Skin: Negative.   Neurological: Positive for weakness and headaches.  Endo/Heme/Allergies: Negative.   Psychiatric/Behavioral: Positive for depression and suicidal ideas. The patient is nervous/anxious and has insomnia.     Blood pressure 111/72, pulse 56, temperature 97.8 F (36.6 C), temperature source Oral, resp. rate 15,  height 5' 10.5" (1.791 m), weight 82.555 kg (182 lb), SpO2 100 %.Body mass index is 25.74 kg/(m^2).  General Appearance: Fairly Groomed  Engineer, water::  Fair  Speech:  Clear and Coherent  Volume:  Decreased  Mood:  Anxious and Depressed  Affect:  Depressed and Restricted  Thought Process:  Coherent and Goal Directed  Orientation:  Full (Time, Place, and Person)  Thought Content:  symptoms events worries concerns a sense of peace wiht the decision of killing himself  Suicidal Thoughts:  Yes.  with intent/plan  Homicidal Thoughts:  No  Memory:  Immediate;   Fair Recent;   Fair Remote;   Fair  Judgement:  Fair  Insight:  Present  Psychomotor Activity:  Decreased  Concentration:  Fair  Recall:  [employer] of Knowledge:Fair  Language: Fair  Akathisia:  No  Handed:  Right  AIMS (if indicated):     Assets:  Others:  healthy  ADL's:  Intact  Cognition: WNL  Sleep:      Risk to  Self: Is patient at risk for suicide?: No Risk to Others:   Prior Inpatient Therapy: [place], [place]  Prior Outpatient Therapy: None currently  Alcohol Screening: 1. How often do you have a drink containing alcohol?: Monthly or less 2. How many drinks containing alcohol do you have on a typical day when you are drinking?: 1 or 2 3. How often do you have six or more drinks on one occasion?: Never Preliminary Score: 0 9. Have you or someone else been injured as a result of your drinking?: No 10. Has a relative or friend or a doctor or another health worker been concerned about your drinking or suggested you cut down?: No Alcohol Use Disorder Identification Test Final Score (AUDIT): 1 Brief Intervention: AUDIT score less than 7 or less-screening does not suggest unhealthy drinking-brief intervention not indicated  Allergies:  No Known Allergies Lab Results:  Results for orders placed or performed during the hospital encounter of 06/04/14 (from the past 48 hour(s))  Acetaminophen level     Status: Abnormal   Collection Time: 06/05/14 12:54 AM  Result Value Ref Range   Acetaminophen (Tylenol), Serum <10.0 (L) 10 - 30 ug/mL    Comment:        THERAPEUTIC CONCENTRATIONS VARY SIGNIFICANTLY. A RANGE OF 10-30 ug/mL MAY BE AN EFFECTIVE CONCENTRATION FOR MANY PATIENTS. HOWEVER, SOME ARE BEST TREATED AT CONCENTRATIONS OUTSIDE THIS RANGE. ACETAMINOPHEN CONCENTRATIONS >150 ug/mL AT 4 HOURS AFTER INGESTION AND >50 ug/mL AT 12 HOURS AFTER INGESTION ARE OFTEN ASSOCIATED WITH TOXIC REACTIONS.   CBC     Status: None   Collection Time: 06/05/14 12:54 AM  Result Value Ref Range   WBC 7.5 4.0 - 10.5 K/uL   RBC 4.76 4.22 - 5.81 MIL/uL   Hemoglobin 15.4 13.0 - 17.0 g/dL   HCT 45.2 39.0 - 52.0 %   MCV 95.0 78.0 - 100.0 fL   MCH 32.4 26.0 - 34.0 pg   MCHC 34.1 30.0 - 36.0 g/dL   RDW 12.7 11.5 - 15.5 %   Platelets 249 150 - 400 K/uL  Comprehensive metabolic panel     Status: None   Collection  Time: 06/05/14 12:54 AM  Result Value Ref Range   Sodium 142 135 - 145 mmol/L   Potassium 3.6 3.5 - 5.1 mmol/L   Chloride 105 96 - 112 mmol/L   CO2 29 19 - 32 mmol/L   Glucose, Bld 98 70 - 99 mg/dL   BUN  17 6 - 23 mg/dL   Creatinine, Ser 0.89 0.50 - 1.35 mg/dL   Calcium 9.5 8.4 - 10.5 mg/dL   Total Protein 7.7 6.0 - 8.3 g/dL   Albumin 4.4 3.5 - 5.2 g/dL   AST 19 0 - 37 U/L   ALT 46 0 - 53 U/L   Alkaline Phosphatase 82 39 - 117 U/L   Total Bilirubin 0.8 0.3 - 1.2 mg/dL   GFR calc non Af Amer >90 >90 mL/min   GFR calc Af Amer >90 >90 mL/min    Comment: (NOTE) The eGFR has been calculated using the CKD EPI equation. This calculation has not been validated in all clinical situations. eGFR's persistently <90 mL/min signify possible Chronic Kidney Disease.    Anion gap 8 5 - 15  Ethanol (ETOH)     Status: None   Collection Time: 06/05/14 12:54 AM  Result Value Ref Range   Alcohol, Ethyl (B) <5 0 - 9 mg/dL    Comment:        LOWEST DETECTABLE LIMIT FOR SERUM ALCOHOL IS 11 mg/dL FOR MEDICAL PURPOSES ONLY   Salicylate level     Status: None   Collection Time: 06/05/14 12:54 AM  Result Value Ref Range   Salicylate Lvl <9.4 2.8 - 20.0 mg/dL  Urine Drug Screen     Status: Abnormal   Collection Time: 06/05/14  9:54 PM  Result Value Ref Range   Opiates NONE DETECTED NONE DETECTED   Cocaine NONE DETECTED NONE DETECTED   Benzodiazepines POSITIVE (A) NONE DETECTED   Amphetamines NONE DETECTED NONE DETECTED   Tetrahydrocannabinol POSITIVE (A) NONE DETECTED   Barbiturates NONE DETECTED NONE DETECTED    Comment:        DRUG SCREEN FOR MEDICAL PURPOSES ONLY.  IF CONFIRMATION IS NEEDED FOR ANY PURPOSE, NOTIFY LAB WITHIN 5 DAYS.        LOWEST DETECTABLE LIMITS FOR URINE DRUG SCREEN Drug Class       Cutoff (ng/mL) Amphetamine      1000 Barbiturate      200 Benzodiazepine   709 Tricyclics       628 Opiates          300 Cocaine          300 THC              50    Current  Medications: Current Facility-Administered Medications  Medication Dose Route Frequency Provider Last Rate Last Dose  . acetaminophen (TYLENOL) tablet 650 mg  650 mg Oral Q4H PRN Lurena Nida, NP      . feeding supplement (ENSURE COMPLETE) (ENSURE COMPLETE) liquid 237 mL  237 mL Oral BID BM Nicholaus Bloom, MD      . LORazepam (ATIVAN) tablet 1 mg  1 mg Oral Q8H PRN Lurena Nida, NP      . nicotine (NICODERM CQ - dosed in mg/24 hours) patch 21 mg  21 mg Transdermal Daily Lurena Nida, NP       PTA Medications: Prescriptions prior to admission  Medication Sig Dispense Refill Last Dose  . benztropine (COGENTIN) 0.5 MG tablet Take 1 tablet (0.5 mg total) by mouth 3 (three) times daily at 8am, 2pm and bedtime. For drug induced tremors 90 tablet 0 Unknown at Unknown time  . clonazePAM (KLONOPIN) 0.5 MG tablet Take 1 tablet (0.5 mg total) by mouth 3 (three) times daily before meals. For anxiety 90 tablet 0 Unknown at Unknown time  . docusate sodium (COLACE)  100 MG capsule Take 1 capsule (100 mg total) by mouth 2 (two) times daily. For constipation 60 capsule 0 Unknown at Unknown time  . famotidine (PEPCID) 20 MG tablet Take 1 tablet (20 mg total) by mouth 2 (two) times daily. For reflux 60 tablet 0 Unknown at Unknown time  . fluvoxaMINE (LUVOX) 50 MG tablet Take 1 tablet (50 mg) every morning with breakfast and 3 tablets every evening with supper for depression (Patient taking differently: Take 50-150 mg by mouth 2 (two) times daily. Take 1 tablet (50 mg) every morning with breakfast and 3 tablets every evening with supper for depression) 120 tablet 0 Unknown at Unknown time  . gabapentin (NEURONTIN) 300 MG capsule Take 2 capsules (600 mg total) by mouth 3 (three) times daily. For alcohol withdrawal syndrome 90 capsule 0 Unknown at Unknown time  . haloperidol (HALDOL) 5 MG tablet Take 1 tablet (5 mg total) by mouth 3 (three) times daily at 8am, 2pm and bedtime. For psychosis 90 tablet 0 Unknown at  Unknown time  . hydrOXYzine (ATARAX/VISTARIL) 25 MG tablet Take 1 tablet (25 mg total) by mouth every 6 (six) hours as needed for anxiety. 60 tablet 0 Unknown at Unknown time  . nicotine (NICODERM CQ - DOSED IN MG/24 HOURS) 21 mg/24hr patch Place 1 patch (21 mg total) onto the skin daily. 7 patch 0 Unknown at Unknown time  . prazosin (MINIPRESS) 1 MG capsule Take 1 capsule (1 mg total) by mouth at bedtime. For nightmares 30 capsule 0 Unknown at Unknown time  . traZODone (DESYREL) 150 MG tablet Take 1 tablet (150 mg total) by mouth at bedtime as needed for sleep. 30 tablet 0 Unknown at Unknown time    Previous Psychotropic Medications: Yes Neurontin Luvox, Klonopin Trazodone Minipress Haldol, Cogentin  Substance Abuse History in the last 12 months:  No.    Consequences of Substance Abuse: Negative  Results for orders placed or performed during the hospital encounter of 06/04/14 (from the past 72 hour(s))  Acetaminophen level     Status: Abnormal   Collection Time: 06/05/14 12:54 AM  Result Value Ref Range   Acetaminophen (Tylenol), Serum <10.0 (L) 10 - 30 ug/mL    Comment:        THERAPEUTIC CONCENTRATIONS VARY SIGNIFICANTLY. A RANGE OF 10-30 ug/mL MAY BE AN EFFECTIVE CONCENTRATION FOR MANY PATIENTS. HOWEVER, SOME ARE BEST TREATED AT CONCENTRATIONS OUTSIDE THIS RANGE. ACETAMINOPHEN CONCENTRATIONS >150 ug/mL AT 4 HOURS AFTER INGESTION AND >50 ug/mL AT 12 HOURS AFTER INGESTION ARE OFTEN ASSOCIATED WITH TOXIC REACTIONS.   CBC     Status: None   Collection Time: 06/05/14 12:54 AM  Result Value Ref Range   WBC 7.5 4.0 - 10.5 K/uL   RBC 4.76 4.22 - 5.81 MIL/uL   Hemoglobin 15.4 13.0 - 17.0 g/dL   HCT 45.2 39.0 - 52.0 %   MCV 95.0 78.0 - 100.0 fL   MCH 32.4 26.0 - 34.0 pg   MCHC 34.1 30.0 - 36.0 g/dL   RDW 12.7 11.5 - 15.5 %   Platelets 249 150 - 400 K/uL  Comprehensive metabolic panel     Status: None   Collection Time: 06/05/14 12:54 AM  Result Value Ref Range   Sodium  142 135 - 145 mmol/L   Potassium 3.6 3.5 - 5.1 mmol/L   Chloride 105 96 - 112 mmol/L   CO2 29 19 - 32 mmol/L   Glucose, Bld 98 70 - 99 mg/dL   BUN 17 6 - 23  mg/dL   Creatinine, Ser 0.89 0.50 - 1.35 mg/dL   Calcium 9.5 8.4 - 10.5 mg/dL   Total Protein 7.7 6.0 - 8.3 g/dL   Albumin 4.4 3.5 - 5.2 g/dL   AST 19 0 - 37 U/L   ALT 46 0 - 53 U/L   Alkaline Phosphatase 82 39 - 117 U/L   Total Bilirubin 0.8 0.3 - 1.2 mg/dL   GFR calc non Af Amer >90 >90 mL/min   GFR calc Af Amer >90 >90 mL/min    Comment: (NOTE) The eGFR has been calculated using the CKD EPI equation. This calculation has not been validated in all clinical situations. eGFR's persistently <90 mL/min signify possible Chronic Kidney Disease.    Anion gap 8 5 - 15  Ethanol (ETOH)     Status: None   Collection Time: 06/05/14 12:54 AM  Result Value Ref Range   Alcohol, Ethyl (B) <5 0 - 9 mg/dL    Comment:        LOWEST DETECTABLE LIMIT FOR SERUM ALCOHOL IS 11 mg/dL FOR MEDICAL PURPOSES ONLY   Salicylate level     Status: None   Collection Time: 06/05/14 12:54 AM  Result Value Ref Range   Salicylate Lvl <0.0 2.8 - 20.0 mg/dL  Urine Drug Screen     Status: Abnormal   Collection Time: 06/05/14  9:54 PM  Result Value Ref Range   Opiates NONE DETECTED NONE DETECTED   Cocaine NONE DETECTED NONE DETECTED   Benzodiazepines POSITIVE (A) NONE DETECTED   Amphetamines NONE DETECTED NONE DETECTED   Tetrahydrocannabinol POSITIVE (A) NONE DETECTED   Barbiturates NONE DETECTED NONE DETECTED    Comment:        DRUG SCREEN FOR MEDICAL PURPOSES ONLY.  IF CONFIRMATION IS NEEDED FOR ANY PURPOSE, NOTIFY LAB WITHIN 5 DAYS.        LOWEST DETECTABLE LIMITS FOR URINE DRUG SCREEN Drug Class       Cutoff (ng/mL) Amphetamine      1000 Barbiturate      200 Benzodiazepine   938 Tricyclics       182 Opiates          300 Cocaine          300 THC              50     Observation Level/Precautions:  15 minute checks  Laboratory:  As per  the ED also testosterone level  Psychotherapy:  Individual/group  Medications:  Will continue the Klonopin, the Prazosin and try Remeron for sleep   Consultations:    Discharge Concerns:  safety  Estimated LOS: 5-7 days  Other:     Psychological Evaluations: No   Treatment Plan Summary: Daily contact with patient to assess and evaluate symptoms and progress in treatment and Medication management Major Depression recurrent severe: will reassess his medication regime and try some other antidepressants with augmentation Panic attacks; will resume the Klonopin Will work with CBT, mindfulness Will order a Testosterone level due the persistent depression with decreased in libido, sexual functioning Suicidal ideas: will monitor closely, he is contracting for safety  Medical Decision Making:  Review of Psycho-Social Stressors (1), Review or order clinical lab tests (1), Review of Medication Regimen & Side Effects (2) and Review of New Medication or Change in Dosage (2)  I certify that inpatient services furnished can reasonably be expected to improve the patient's condition.   Alba A 3/12/20162:43 PM

## 2014-06-06 NOTE — BH Assessment (Signed)
[facility], states room [id number] will be available after 0800. Pt has been accepted by Hulan FessIjeoma Nwaeze, NP to the service of Dr. Geoffery LyonsIrving Lugo. Notified Dr. Read DriversMolpus and Lorayne Marekochelle, RN of acceptance.  Harlin RainFord Ellis Ria CommentWarrick Jr, LPC, [facility] Triage Specialist [phone]

## 2014-06-06 NOTE — Progress Notes (Signed)
Psychoeducational Group Note  Date:  06/06/2014 Time:  1315  Group Topic/Focus:  Identifying Needs:   The focus of this group is to help patients identify their personal needs that have been historically problematic and identify healthy behaviors to address their needs.  Participation Level: Did Not Attend  Participation Quality:  Not Applicable  Affect:  Not Applicable  Cognitive:  Not Applicable  Insight:  Not Applicable  Engagement in Group: Not Applicable  Additional Comments:    Rich BraveDuke, Mable Lashley Lynn 06/06/2014, 3:13 PM

## 2014-06-06 NOTE — Progress Notes (Addendum)
Phillip [name] is a very very VERY  depressed 44 yo caucasian male who [facility]  By ( his " military friend" called 911 after visiitng with him yesterday ) . He says he has been fighting  depression " really bad for the last 9 months". This Clinical research associatewriter asked him if he could identify what happened 9 months ago and he said he lost his job. HE avoids making eye contact with this nurse during entire admission process. He shares that he was a patient here last week and Dr. Mariam DollarEapen was his doctor. HE speaks so softly this nurse ALMOST cannot hear what he's saying. A few times during the assessment, he becomes tearful and has to stop talking to gather himself. He says his PMH is depression, anxiety ( he says this is BIG ) , PTSD ( from the [employer]), Suicide attempts including OD . HE says he does not know  What he  wants and / or needs to help him, just that he knows " he cannot go on this way"..he states he has been " off my meds for 3-4 weeks" prior to this admission and his face remains blank and disinterested when this nurse asks him if he knows why he quits taking his meds when he gets out of here.  He denies known drug allergies and say he has been taking klonopnn1 mg tid ( ' from my PCP') and that he would like something for anxiety. HE is oriented to unit, he contacts for safety and he is accompanied to his room.

## 2014-06-06 NOTE — Progress Notes (Signed)
Initial Interdisciplinary Treatment Plan   PATIENT STRESSORS: Educational concerns Financial difficulties Health problems Legal issue   PATIENT STRENGTHS: Ability for insight Active sense of humor Average or above average intelligence Capable of independent living   PROBLEM LIST: Problem List/Patient Goals Date to be addressed Date deferred Reason deferred Estimated date of resolution  Suicidal Ideation 06/06/14     Depression 06/06/14     Anxiety 06/06/14     Substance Abuse 06/06/14                                    DISCHARGE CRITERIA:  Ability to meet basic life and health needs Adequate post-discharge living arrangements Improved stabilization in mood, thinking, and/or behavior Medical problems require only outpatient monitoring  PRELIMINARY DISCHARGE PLAN: Attend aftercare/continuing care group Attend PHP/IOP Attend 12-step recovery group Outpatient therapy  PATIENT/FAMIILY INVOLVEMENT: This treatment plan has been presented to and reviewed with the patient, Phillip SailorsSteven [name], and/or family member, .  The patient and family have been given the opportunity to ask questions and make suggestions.  Rich BraveDuke, Elinda Bunten Lynn 06/06/2014, 11:56 AM

## 2014-06-06 NOTE — [facility] Suicide Risk Assessment (Signed)
[facility]   Nursing information obtained from:    Demographic factors:    Current Mental Status:    Loss Factors:    Historical Factors:    Risk Reduction Factors:    Total Time spent with patient: 45 minutes Principal Problem: MDD (major depressive disorder), recurrent, severe, with psychosis Diagnosis:   Patient Active Problem List   Diagnosis Date Noted  . Major depression [F32.2] 06/06/2014  . Suicidal ideations [R45.851] 06/05/2014  . Panic disorder [F41.0] 05/19/2014  . MDD (major depressive disorder), recurrent, severe, with psychosis [F33.3] 05/18/2014  . PTSD (post-traumatic stress disorder) [F43.10] 01/29/2014  . Generalized anxiety disorder [F41.1] 06/19/2011    Class: Chronic     Continued Clinical Symptoms:  Alcohol Use Disorder Identification Test Final Score (AUDIT): 1 The "Alcohol Use Disorders Identification Test", Guidelines for Use in Primary Care, Second Edition.  World Science writerHealth Organization [facility](WHO). Score between 0-7:  no or low risk or alcohol related problems. Score between 8-15:  moderate risk of alcohol related problems. Score between 16-19:  high risk of alcohol related problems. Score 20 or above:  warrants further diagnostic evaluation for alcohol dependence and treatment.   CLINICAL FACTORS:   Panic Attacks Depression:   Anhedonia Hopelessness Insomnia Recent sense of peace/wellbeing Severe  Psychiatric Specialty Exam: Physical Exam  ROS  Blood pressure 111/72, pulse 56, temperature 97.8 F (36.6 C), temperature source Oral, resp. rate 15, height 5' 10.5" (1.791 m), weight 82.555 kg (182 lb), SpO2 100 %.Body mass index is 25.74 kg/(m^2).   COGNITIVE FEATURES THAT CONTRIBUTE TO RISK:  Closed-mindedness, Polarized thinking and Thought constriction (tunnel vision)    SUICIDE RISK:   Severe:  Frequent, intense, and enduring suicidal ideation, specific plan, no subjective intent, but some objective markers of intent  (i.e., choice of lethal method), the method is accessible, some limited preparatory behavior, evidence of impaired self-control, severe dysphoria/symptomatology, multiple risk factors present, and few if any protective factors, particularly a lack of social support.  PLAN OF CARE: Supportive approach/coping skills                               Major Depression: reassess his psychotropic medications, choose a different antidepressant and augment                               Panic Attacks: continue the Klonopin 1 mg TID Use CBT/mindfulness and other strategies to address the depression and the anxiety  Medical Decision Making:  Review of Psycho-Social Stressors (1), Review or order clinical lab tests (1), Review of Medication Regimen & Side Effects (2) and Review of New Medication or Change in Dosage (2)  I certify that inpatient services furnished can reasonably be expected to improve the patient's condition.   Lucy Boardman A 06/06/2014, 5:14 PM

## 2014-06-06 NOTE — Progress Notes (Signed)
D: Patient seen on dayroom watching TV. No interaction. Flat affect and minimal conversation with this Clinical research associatewriter. Patient stated with much concern "Pls nurse, are you sure this medicine can put me to sleep. It's my first time of taking it.The dr said that It is better than Ambien and Trazodone".  A: The writer advised patient to take the medication to night and see how it works for him. Support and encouragement offered. Denies pain, SI, AH/VH. Endorses depression 7/10. Due medications given as ordered. Every 15 minutes check for safety maintained. No behavioral issues noted. Will continue to monitor patient. R: Patient receptive to nursing intervention.

## 2014-06-06 NOTE — ED Notes (Signed)
Phillip [name] , TTS Clinical research associatecounselor notified writer [facility] 8 am. Patient assigned to 307.2 to the services of Dr. Dub MikesLugo.

## 2014-06-07 LAB — TESTOSTERONE: Testosterone: 296 ng/dL — ABNORMAL LOW (ref 300–890)

## 2014-06-07 MED ORDER — ARIPIPRAZOLE 2 MG PO TABS
2.0000 mg | ORAL_TABLET | Freq: Every day | ORAL | Status: DC
  Administered 2014-06-07 – 2014-06-08 (×2): 2 mg via ORAL
  Filled 2014-06-07 (×4): qty 1

## 2014-06-07 MED ORDER — TRAZODONE HCL 150 MG PO TABS
150.0000 mg | ORAL_TABLET | Freq: Every evening | ORAL | Status: DC | PRN
  Administered 2014-06-08: 150 mg via ORAL
  Filled 2014-06-07: qty 1

## 2014-06-07 MED ORDER — CLONAZEPAM 1 MG PO TABS
1.0000 mg | ORAL_TABLET | Freq: Once | ORAL | Status: AC
  Administered 2014-06-07: 1 mg via ORAL

## 2014-06-07 MED ORDER — HYDROXYZINE HCL 25 MG PO TABS
25.0000 mg | ORAL_TABLET | Freq: Four times a day (QID) | ORAL | Status: DC | PRN
  Administered 2014-06-10 – 2014-06-21 (×10): 25 mg via ORAL
  Filled 2014-06-07 (×6): qty 1
  Filled 2014-06-07: qty 10
  Filled 2014-06-07 (×6): qty 1

## 2014-06-07 MED ORDER — GABAPENTIN 300 MG PO CAPS: 300.0000 mg | ORAL_CAPSULE | Freq: Three times a day (TID) | ORAL | Status: DC

## 2014-06-07 MED ORDER — GABAPENTIN 300 MG PO CAPS
300.0000 mg | ORAL_CAPSULE | Freq: Three times a day (TID) | ORAL | Status: DC
  Administered 2014-06-07 – 2014-06-23 (×48): 300 mg via ORAL
  Filled 2014-06-07 (×57): qty 1

## 2014-06-07 NOTE — [facility] Group Notes (Signed)
[facility] Group Notes: (Clinical Social Work)   06/07/2014      Type of Therapy:  Group Therapy   Participation Level:  Did Not Attend despite MHT prompting   Aiya Keach Grossman-Orr, LCSW 06/07/2014, 12:09 PM     

## 2014-06-07 NOTE — Progress Notes (Signed)
[facility]  06/07/2014 3:04 PM Trey SailorsSteven [name]  MRN:  [id number] Subjective:  Phillip SpareSteven continues to express a sense of hopelessness helplessness with a sense of peace as he is going to end the suffering by killing himself. He continues to affirm that he has nothing to live for. He states that her daughter goes trough the same things he goes trough. States she stays to herself. He is part feels guilty because " she got what I have." even thinking about his daughter is not a reason right now to go on.  Principal Problem: MDD (major depressive disorder), recurrent, severe, with psychosis Diagnosis:   Patient Active Problem List   Diagnosis Date Noted  . Major depression [F32.2] 06/06/2014  . Suicidal ideations [R45.851] 06/05/2014  . Panic disorder [F41.0] 05/19/2014  . MDD (major depressive disorder), recurrent, severe, with psychosis [F33.3] 05/18/2014  . PTSD (post-traumatic stress disorder) [F43.10] 01/29/2014  . Generalized anxiety disorder [F41.1] 06/19/2011    Class: Chronic   Total Time spent with patient: 30 minutes   Past Medical History:  Past Medical History  Diagnosis Date  . Anxiety   . Depression   . PTSD (post-traumatic stress disorder)     Past Surgical History  Procedure Laterality Date  . Cholesterol     Family History:  Family History  Problem Relation Age of Onset  . Cancer Mother   . Cancer Father   . Depression Father    Social History:  History  Alcohol Use No     History  Drug Use No    Comment: denies    History   Social History  . Marital Status: Married    Spouse Name: N/A  . Number of Children: N/A  . Years of Education: N/A   Social History Main Topics  . Smoking status: Current Every Day Smoker -- 1.00 packs/day for 19 years    Types: Cigarettes  . Smokeless tobacco: Not on file  . Alcohol Use: No  . Drug Use: No     Comment: denies  . Sexual Activity: Yes    Birth Control/ Protection: None   Other Topics Concern  .  None   Social History Narrative   Additional History:    Sleep: Fair  Appetite:  Poor   Assessment:   Musculoskeletal: Strength & Muscle Tone: within normal limits Gait & Station: normal Patient leans: N/A   Psychiatric Specialty Exam: Physical Exam  Review of Systems  Constitutional: Positive for malaise/fatigue.  HENT: Negative.   Eyes: Negative.   Respiratory: Negative.   Cardiovascular: Negative.   Gastrointestinal: Negative.   Genitourinary: Negative.   Musculoskeletal: Negative.   Skin: Negative.   Neurological: Positive for weakness.  Endo/Heme/Allergies: Negative.   Psychiatric/Behavioral: Positive for depression and suicidal ideas. The patient is nervous/anxious and has insomnia.     Blood pressure 112/84, pulse 70, temperature 97.9 F (36.6 C), temperature source Oral, resp. rate 16, height 5' 10.5" (1.791 m), weight 82.555 kg (182 lb), SpO2 100 %.Body mass index is 25.74 kg/(m^2).  General Appearance: Fairly Groomed  Patent attorneyye Contact::  Minimal  Speech:  Clear and Coherent, Slow and not spontaneous  Volume:  Decreased  Mood:  Depressed  Affect:  Depressed and Restricted  Thought Process:  Coherent and Goal Directed  Orientation:  Full (Time, Place, and Person)  Thought Content:  symptoms events answers questions no spontanoues content  Suicidal Thoughts:  Yes.  with intent/plan will contract for safety  Homicidal Thoughts:  No  Memory:  Immediate;   Fair Recent;   Fair Remote;   Fair  Judgement:  Fair  Insight:  Present  Psychomotor Activity:  Restlessness  Concentration:  Fair  Recall:  [employer] of Knowledge:Fair  Language: Fair  Akathisia:  No  Handed:  Right  AIMS (if indicated):     Assets:  Physical Health  ADL's:  Intact  Cognition: WNL  Sleep:  Number of Hours: 5.25     Current Medications: Current Facility-Administered Medications  Medication Dose Route Frequency Provider Last Rate Last Dose  . acetaminophen (TYLENOL) tablet 650  mg  650 mg Oral Q4H PRN Kristeen Mans, NP      . alum & mag hydroxide-simeth (MAALOX/MYLANTA) 200-200-20 MG/5ML suspension 30 mL  30 mL Oral Q4H PRN Rachael Fee, MD      . ARIPiprazole (ABILIFY) tablet 2 mg  2 mg Oral Daily Rachael Fee, MD   2 mg at 06/07/14 1319  . clonazePAM (KLONOPIN) tablet 1 mg  1 mg Oral TID Rachael Fee, MD   1 mg at 06/07/14 0936  . feeding supplement (ENSURE COMPLETE) (ENSURE COMPLETE) liquid 237 mL  237 mL Oral BID BM Rachael Fee, MD   237 mL at 06/07/14 0936  . gabapentin (NEURONTIN) capsule 300 mg  300 mg Oral TID Rachael Fee, MD   300 mg at 06/07/14 1318  . magnesium hydroxide (MILK OF MAGNESIA) suspension 30 mL  30 mL Oral Daily PRN Rachael Fee, MD      . mirtazapine (REMERON) tablet 15 mg  15 mg Oral QHS Rachael Fee, MD   15 mg at 06/06/14 2131  . nicotine (NICODERM CQ - dosed in mg/24 hours) patch 21 mg  21 mg Transdermal Daily Kristeen Mans, NP   21 mg at 06/07/14 1015  . prazosin (MINIPRESS) capsule 1 mg  1 mg Oral QHS Rachael Fee, MD   1 mg at 06/06/14 2131    Lab Results:  Results for orders placed or performed during the hospital encounter of 06/06/14 (from the past 48 hour(s))  Testosterone     Status: Abnormal   Collection Time: 06/06/14  7:52 PM  Result Value Ref Range   Testosterone 296 (L) 300 - 890 ng/dL    Comment: (NOTE)          Tanner Stage       Male              Male              I              < 30 ng/dL        < 10 ng/dL              II             < 150 ng/dL       < 30 ng/dL              III            100-320 ng/dL     < 35 ng/dL              IV             200-970 ng/dL     47-82 ng/dL              V/Adult        300-890 ng/dL  10-70 ng/dL Performed at [facility]     Physical Findings: AIMS: Facial and Oral Movements Muscles of Facial Expression: None, normal Lips and Perioral Area: None, normal Jaw: None, normal Tongue: None, normal,Extremity Movements Upper (arms, wrists, hands, fingers): None,  normal Lower (legs, knees, ankles, toes): None, normal, Trunk Movements Neck, shoulders, hips: None, normal, Overall Severity Severity of abnormal movements (highest score from questions above): None, normal Incapacitation due to abnormal movements: None, normal Patient's awareness of abnormal movements (rate only patient's report): No Awareness, Dental Status Current problems with teeth and/or dentures?: No Does patient usually wear dentures?: No  CIWA:  CIWA-Ar Total: 1 COWS:  COWS Total Score: 5  Treatment Plan Summary: Daily contact with patient to assess and evaluate symptoms and progress in treatment and Medication management Major Depression recurrent severe; Othal cant recall positive response to any of the antidepressants that he has taken in the past. He has not been on  TCAs, MAO Inhibitors or the newer SSRI's or NSRI's. He has not have Abilify augmentation. He would not consider ECT.  Will start Abilify 2 mg with plans to increase to 5. He did sleep better with the Remeron 15 last night. Will continue the Remeron and maybe use it at a full antidepressant dose.  Anxiety: continues to endorse severe anxiety even on the Klonopin 1 mg TID,he  cant take Seroquel so will add Neurontin 300 mg TID Total testosterone level was low but the blood sample was taken in the evening. Will repeat the Total testosterone in the AM to get a more accurate reading. Will also get a PSA level. If he was to have the low testosterone confirmed given the symptoms he is presenting will be justifiable to start replacement therapy Suicidal ideas: will continue to monitor will keep the hope going for him. He contracts for safety while here   Medical Decision Making:  Review of Psycho-Social Stressors (1), Review or order clinical lab tests (1), Review of Medication Regimen & Side Effects (2) and Review of New Medication or Change in Dosage (2)     Eulon Allnutt A 06/07/2014, 3:04 PM
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
## 2014-06-07 NOTE — Progress Notes (Signed)
D) Pt has been withdrawn and seclusive to self. Sits in the dayroom and just stares. Eye contact is poor. Is relating to some of the younger male Pt's but even with them Pt is withdrawn and to himself. Pt rates his depression, hopelessness and anxiety all at a 10 and admits to thoughts of SI. Contracts for safety on the unit. Did attend some of the groups but will not speak up in them. In a 1:1 Pt verbalized that he wanted to take his car and go over several states and take his gun and "blow my head off". States "my mother and father are dead and I have a girlfriend that is not healthy for me. It is too late". When Pt stated it was too late, he was referring to the fact that he should have killed himself after he left here the last time. Tearful. A) Given support and reassurance. Attempts made all day to connect to Pt on some level and was able for Pt to go to the groups. Provided with a 1:1 R) Pt admits to thoughts of SI, but "is not able to do it here" Remains morbidly depressed.

## 2014-06-07 NOTE — Progress Notes (Signed)
[facility] Group Notes:  (Nursing/MHT/Case Management/Adjunct)  Date:  06/07/2014  Time:  1:29 AM  Type of Therapy:  Psychoeducational Skills  Participation Level:  Minimal  Participation Quality:  Resistant  Affect:  Depressed and Flat  Cognitive:  Lacking  Insight:  None  Engagement in Group:  Resistant  Modes of Intervention:  Education  Summary of Progress/Problems: The patient attended group last evening but refused to share. When asked if he had a support system (theme of the day), he responded by saying that his "45" (45 caliber gun) will serve as his support.   Nathifa Ritthaler S 06/07/2014, 1:29 AM
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
## 2014-06-08 LAB — TESTOSTERONE, % FREE: TESTOSTERONE-% FREE: 1.7 % — AB (ref 1.6–2.9)

## 2014-06-08 LAB — SEX HORMONE BINDING GLOBULIN: SEX HORMONE BINDING: 42 nmol/L (ref 10–50)

## 2014-06-08 LAB — TESTOSTERONE, FREE: Testosterone, Free: 49.9 pg/mL (ref 47.0–244.0)

## 2014-06-08 MED ORDER — MIRTAZAPINE 30 MG PO TABS
30.0000 mg | ORAL_TABLET | Freq: Every day | ORAL | Status: DC
  Administered 2014-06-08: 30 mg via ORAL
  Filled 2014-06-08 (×3): qty 1

## 2014-06-08 MED ORDER — CHLORDIAZEPOXIDE HCL 25 MG PO CAPS
25.0000 mg | ORAL_CAPSULE | Freq: Once | ORAL | Status: AC
  Administered 2014-06-08: 25 mg via ORAL
  Filled 2014-06-08: qty 1

## 2014-06-08 MED ORDER — ARIPIPRAZOLE 5 MG PO TABS
5.0000 mg | ORAL_TABLET | Freq: Every day | ORAL | Status: DC
  Administered 2014-06-09 – 2014-06-13 (×5): 5 mg via ORAL
  Filled 2014-06-08 (×8): qty 1

## 2014-06-08 MED ORDER — CHLORDIAZEPOXIDE HCL 25 MG PO CAPS
25.0000 mg | ORAL_CAPSULE | Freq: Four times a day (QID) | ORAL | Status: DC
  Administered 2014-06-08 – 2014-06-09 (×4): 25 mg via ORAL
  Filled 2014-06-08 (×4): qty 1

## 2014-06-08 MED ORDER — ZOLPIDEM TARTRATE 10 MG PO TABS
10.0000 mg | ORAL_TABLET | Freq: Every evening | ORAL | Status: DC | PRN
  Administered 2014-06-08: 10 mg via ORAL
  Filled 2014-06-08: qty 1

## 2014-06-08 NOTE — Progress Notes (Signed)
[facility] MD Progress Note  06/08/2014 12:21 PM Phillip SailorsSteven [name]  MRN:  [id number] Subjective:  Phillip SpareSteven continues to have a hard time, states he is more irritable, has a headache that is not going away, admits to sporadic blurred vision and double vision. She states that his ex-GF came to visit and he told her to get rid of all his things. He states he does not want anything to do with her and does not want so see her kids either ( he was closed to her two kids) states he wants to be left alone, go away. Still with SI with a plan Principal Problem: MDD (major depressive disorder), recurrent, severe, with psychosis Diagnosis:   Patient Active Problem List   Diagnosis Date Noted  . Major depression [F32.2] 06/06/2014  . Suicidal ideations [R45.851] 06/05/2014  . Panic disorder [F41.0] 05/19/2014  . MDD (major depressive disorder), recurrent, severe, with psychosis [F33.3] 05/18/2014  . PTSD (post-traumatic stress disorder) [F43.10] 01/29/2014  . Generalized anxiety disorder [F41.1] 06/19/2011    Class: Chronic   Total Time spent with patient: 30 minutes   Past Medical History:  Past Medical History  Diagnosis Date  . Anxiety   . Depression   . PTSD (post-traumatic stress disorder)     Past Surgical History  Procedure Laterality Date  . Cholesterol     Family History:  Family History  Problem Relation Age of Onset  . Cancer Mother   . Cancer Father   . Depression Father    Social History:  History  Alcohol Use No     History  Drug Use No    Comment: denies    History   Social History  . Marital Status: Married    Spouse Name: N/A  . Number of Children: N/A  . Years of Education: N/A   Social History Main Topics  . Smoking status: Current Every Day Smoker -- 1.00 packs/day for 19 years    Types: Cigarettes  . Smokeless tobacco: Not on file  . Alcohol Use: No  . Drug Use: No     Comment: denies  . Sexual Activity: Yes    Birth Control/ Protection: None   Other Topics  Concern  . None   Social History Narrative   Additional History:    Sleep: Poor  Appetite:  Poor   Assessment:   Musculoskeletal: Strength & Muscle Tone: within normal limits Gait & Station: normal Patient leans: N/A   Psychiatric Specialty Exam: Physical Exam  Review of Systems  Constitutional: Positive for malaise/fatigue.  Eyes: Negative.   Respiratory: Negative.   Cardiovascular: Negative.   Gastrointestinal: Negative.   Genitourinary: Negative.   Musculoskeletal: Negative.   Skin: Negative.   Neurological: Positive for weakness and headaches.  Endo/Heme/Allergies: Negative.   Psychiatric/Behavioral: Positive for depression and suicidal ideas. The patient is nervous/anxious and has insomnia.     Blood pressure 135/75, pulse 90, temperature 98 F (36.7 C), temperature source Oral, resp. rate 18, height 5' 10.5" (1.791 m), weight 82.555 kg (182 lb), SpO2 100 %.Body mass index is 25.74 kg/(m^2).  General Appearance: Fairly Groomed  Patent attorneyye Contact::  Minimal  Speech:  Clear and Coherent and not spontaneous   Volume:  Decreased  Mood:  Depressed, Dysphoric, Hopeless, Irritable and Worthless  Affect:  Restricted  Thought Process:  Coherent and Goal Directed  Orientation:  Full (Time, Place, and Person)  Thought Content:  symptoms events worries concerns  Suicidal Thoughts:  Yes.  with intent/plan  Homicidal Thoughts:  No  Memory:  Immediate;   Fair Recent;   Fair Remote;   Fair  Judgement:  Fair  Insight:  Present and Shallow  Psychomotor Activity:  Restlessness  Concentration:  Fair  Recall:  [employer] of Knowledge:Fair  Language: Fair  Akathisia:  No  Handed:  Right  AIMS (if indicated):     Assets:  Physical Health  ADL's:  Intact  Cognition: WNL  Sleep:  Number of Hours: 5     Current Medications: Current Facility-Administered Medications  Medication Dose Route Frequency Provider Last Rate Last Dose  . acetaminophen (TYLENOL) tablet 650 mg  650  mg Oral Q4H PRN Kristeen Mans, NP      . alum & mag hydroxide-simeth (MAALOX/MYLANTA) 200-200-20 MG/5ML suspension 30 mL  30 mL Oral Q4H PRN Rachael Fee, MD      . Melene Muller ON 06/09/2014] ARIPiprazole (ABILIFY) tablet 5 mg  5 mg Oral Daily Rachael Fee, MD      . clonazePAM Scarlette Calico) tablet 1 mg  1 mg Oral TID Rachael Fee, MD   1 mg at 06/08/14 1110  . feeding supplement (ENSURE COMPLETE) (ENSURE COMPLETE) liquid 237 mL  237 mL Oral BID BM Rachael Fee, MD   237 mL at 06/07/14 0936  . gabapentin (NEURONTIN) capsule 300 mg  300 mg Oral TID Rachael Fee, MD   300 mg at 06/08/14 1110  . hydrOXYzine (ATARAX/VISTARIL) tablet 25 mg  25 mg Oral Q6H PRN Worthy Flank, NP      . magnesium hydroxide (MILK OF MAGNESIA) suspension 30 mL  30 mL Oral Daily PRN Rachael Fee, MD      . mirtazapine (REMERON) tablet 30 mg  30 mg Oral QHS Rachael Fee, MD      . nicotine (NICODERM CQ - dosed in mg/24 hours) patch 21 mg  21 mg Transdermal Daily Kristeen Mans, NP   21 mg at 06/07/14 1015  . prazosin (MINIPRESS) capsule 1 mg  1 mg Oral QHS Rachael Fee, MD   1 mg at 06/07/14 2130  . traZODone (DESYREL) tablet 150 mg  150 mg Oral QHS PRN Worthy Flank, NP      . zolpidem (AMBIEN) tablet 10 mg  10 mg Oral QHS PRN Rachael Fee, MD        Lab Results:  Results for orders placed or performed during the hospital encounter of 06/06/14 (from the past 48 hour(s))  Testosterone     Status: Abnormal   Collection Time: 06/06/14  7:52 PM  Result Value Ref Range   Testosterone 296 (L) 300 - 890 ng/dL    Comment: (NOTE)          Tanner Stage       Male              Male              I              < 30 ng/dL        < 10 ng/dL              II             < 150 ng/dL       < 30 ng/dL              III            100-320 ng/dL     <  35 ng/dL              IV             200-970 ng/dL     16-10 ng/dL              V/Adult        300-890 ng/dL     96-04 ng/dL Performed at [facility]   Testosterone, free      Status: None   Collection Time: 06/06/14  7:52 PM  Result Value Ref Range   Testosterone, Free 49.9 47.0 - 244.0 pg/mL    Comment: (NOTE) The concentration of free testosterone is derived from a mathematical expression based on constants for the binding of testosterone to sex hormone-binding globulin and albumin. Performed at [facility]   Testosterone, % free     Status: Abnormal   Collection Time: 06/06/14  7:52 PM  Result Value Ref Range   Testosterone-% Free 1.7 (H) 1.6 - 2.9 %    Comment: Performed at [facility]  Sex hormone binding globulin     Status: None   Collection Time: 06/06/14  7:52 PM  Result Value Ref Range   Sex Hormone Binding 42 10 - 50 nmol/L    Comment: Performed at [facility]    Physical Findings: AIMS: Facial and Oral Movements Muscles of Facial Expression: None, normal Lips and Perioral Area: None, normal Jaw: None, normal Tongue: None, normal,Extremity Movements Upper (arms, wrists, hands, fingers): None, normal Lower (legs, knees, ankles, toes): None, normal, Trunk Movements Neck, shoulders, hips: None, normal, Overall Severity Severity of abnormal movements (highest score from questions above): None, normal Incapacitation due to abnormal movements: None, normal Patient's awareness of abnormal movements (rate only patient's report): No Awareness, Dental Status Current problems with teeth and/or dentures?: No Does patient usually wear dentures?: No  CIWA:  CIWA-Ar Total: 1 COWS:  COWS Total Score: 5  Treatment Plan Summary: Daily contact with patient to assess and evaluate symptoms and progress in treatment and Medication management Major depression severe; will increase the Remeron to 30 mg HS and increase the Abilify to 5 mg Insomnia: Remeron not helping anymore, will try Ambien ( he has used it before does not recall what happened with it Suicidal ideas: will work on challenging the distorted thinking with CBT, will  continue to monitor, he is contracting for safety while in the unit Will work on installing a sense of hope Will re test the testosterone level in the AM, and replace if indicated Medical Decision Making:  Review of Psycho-Social Stressors (1), Review or order clinical lab tests (1), Review of Medication Regimen & Side Effects (2) and Review of New Medication or Change in Dosage (2)     Bellamie Turney A 06/08/2014, 12:21 PM

## 2014-06-08 NOTE — [facility] Group Notes (Signed)
[facility] L[facility] Dis[facility] Planning Group Note   06/08/2014 11:32 AM  Participation Quality:  Invited-DID NOT ATTEND. Pt continues to isolate in room.   Smart, American FinancialHeather LCSWA

## 2014-06-08 NOTE — [facility] Group Notes (Signed)
[facility] LCSW Group Therapy  06/08/2014 3:24 PM  Type of Therapy:  Group Therapy  Participation Level:  Did Not Attend-pt at medication window. Invited to group. Pt chose not to attend. Pt continues to isolate in his room.   Summary of Progress/Problems: Today's Topic: Overcoming Obstacles. Pt identified obstacles faced currently and processed barriers involved in overcoming these obstacles. Pt identified steps necessary for overcoming these obstacles and explored motivation (internal and external) for facing these difficulties head on. Pt further identified one area of concern in their lives and chose a skill of focus pulled from their "toolbox."   Smart, OxfordHeather LCSWA  06/08/2014, 3:24 PM

## 2014-06-08 NOTE — Progress Notes (Addendum)
Pt was lying in the bed this am with the covers over his head. The writer asked pt to come and get his am medications . Pt stated,"I decline." The writer repeated what the pt said and once again he stated," Decline means I do not want my medications."Nurse , Meriam SpragueBeverly, made aware pt refused am meds. Per MD pt did share his suicidal plan. He stated he would go to another state and make sure fingerprints are not on a gun and kill himself. Pt would not disclose to the MD where he has a gun. He feels very despondent as his GF left him and feels he has nothing to live for.10:20am -pt remains in bed with the covers pulled over his head. Respirations are regular and non-labored. 11am-pt did come to the med window and decided to take his am meds. He appears very flat and blunted. Pt returned back to bed.  He has little if no eye contact and appears to be just going through the motions.

## 2014-06-08 NOTE — Progress Notes (Signed)
Recreation Therapy Notes  Date: 03.14.2016 Time: 9:30am Location: [address] Group Room   Group Topic: Stress Management  Goal Area(s) Addresses:  Patient will actively participate in stress management techniques presented during session.   Behavioral Response: Did not attend.   Marykay Lexenise L Johnney Scarlata, LRT/CTRS  Leathie Weich L 06/08/2014 2:25 PM

## 2014-06-08 NOTE — Progress Notes (Signed)
Pt has been observed sitting in the dayroom most of the evening taking with peers and watching TV.  Pt reports he is feeling better this evening.  He contracts for [employer] with writer.  He denies HI/AV.  He says he has a difficult time sleeping and is hopeful that the med changes that were made will help him sleep tonight.  He received Ambien at 2103 per request after writer explained that he needed to go to bed and relax after taking it.  Pt was observed after 2200 sitting in the dayroom, yawning, still talking with peers.  Pt came to Clinical research associatewriter requesting the prn trazodone at 2237.  Writer encouraged pt to take the Remeron that he initially refused earlier.  Pt is pleasant/cooperative, but conversation was minimal.  Discharge plans are in process.  Support and encouragement offered.  Pt makes his needs known to staff.  Safety maintained with q15 minute checks.

## 2014-06-08 NOTE — Tx Team (Signed)
Interdisciplinary Treatment Plan Update (Adult)   Date: 06/08/2014   Time Reviewed: 9:30AM Progress in Treatment:  Attending groups: No -pt continues to isolate in his room.  Participating in groups:  No  Taking medication as prescribed: Yes  Tolerating medication: Yes  Family/Significant othe contact made: No. Pt refusing family contact for SPE or collateral information.  Patient understands diagnosis: No-pt reports that he does not want resources "wasted" on him. Pt has clear plan to d/c, drive out of state and kill himself. [facility]  Discussing patient identified problems/goals with staff: Yes  Medical problems stabilized or resolved: Yes  Denies suicidal/homicidal ideation: SI reported/able to contract for safety on unit.  Patient has not harmed self or Others: Yes  New problem(s) identified:  Discharge Plan or Barriers: Pt is currently refusing to attend group or discuss aftercare planning. Pt refusing to sign consents or participate in d/c plan. Pt plans to kill himself after being d/ced and makes this clear to staff. [facility] Referral made today, 06/08/14 due to high risk of SI/chronic and severe depression. Pt is refusing to allow collateral contact with family/girlfriend/friends.  Additional comments: 44 Y/o male D/C from our unit a week ago. States that when he left he went back to his GF. States that things did not work out. They broke up. States he also lost his job. States he has nothing left. States that killing himself is "the right thing to do." states he has pushed everyone away has no family left. He reports med noncompliance [facility]. Pt reports that he is "not depressed" but rather, "at peace and relieved" because he has reportedly made up his mind to end his life. Pt states that he owns a gun and plans to shoot himself; he even admitted to the counselor that he is considering "just lying to the psychiatrists" in order to be [facility]. He says that he has nothing else to live for and that he has done everything that he wanted to do in his life. The pt appeared to be at peace with his decision and admitted that the only fear he has about his decision to end his own life would be "going to hell"; however, the pt quickly adds, "I'm ready for it though". Though he denies any current depression, the pt reports increased depressive sx in the past 5 months, including multiple crying spells throughout the day, insomnia, social isolation and "pushing everyone away", deceased appetite, fatigue, lack of motivation, decreased concentration, inappropriate guilt, feelings of hopelessness, and severe suicidal thoughts with plan and intent. Some of the triggers for the pt's increase in sx include recently losing his job and his fiancee.Pt reports daily panic attacks and says that he only sleeps 1-2 hours per night out of fear of nightmares in relation to the trauma he experienced while in [place] [facility] [place] [facility] [place]. He reports sleeping 1-2 hours per night on average. He states that he only eats about once per day and has lost a substantial amount of weight in recent months. In addition, the pt reports anxiety when in crowds and around other people. Pt reports that no mental health professionals can help him and that he has been in psychiatric hospitals 5 times in the past 5 months. Pt talks about how he has read "every book" on his diagnoses and the various treatments available. He reports attempting suicide at least twice in the past via OD.  Reason for Continuation of Hospitalization: SI Depression/mood instability Medication stabilization Estimated length of stay: 5-7 days  For review of initial/current patient goals, please see plan of care.  Attendees:  Patient:    Family:    Physician: Geoffery Lyons MD 06/08/2014   Nursing: Meriam Sprague RN; Farley Ly RN 06/08/2014   Clinical Social Worker Carleena Mires Smart, LCSWA  06/08/2014    Other: Arva Chafe; Lynann Bologna 06/08/2014   Other: Darden Dates Nurse CM 06/08/2014   Other: Liliane Bade, Community Care Coordinator  06/08/2014   Other:    Scribe for Treatment Team:  Herbert Seta Smart LCSWA 06/08/2014 9:30AM

## 2014-06-08 NOTE — Plan of Care (Signed)
Problem: Consults Goal: Depression Patient Education See Patient Education Module for education specifics.  Outcome: Completed/Met Date Met:  06/08/14 Nurse discussed depression with patient.     

## 2014-06-08 NOTE — Clinical Social Work Note (Signed)
[facility] at 3:20PM by CSW.  The Sherwin-WilliamsHeather Smart, LCSWA 06/08/2014 3:25 PM

## 2014-06-08 NOTE — Progress Notes (Addendum)
Patient seen lying in bed gazing at the ceiling. Patient stated "I'm just thinking about war, those faces keep flashing. I can't take my mind off it. I just want to end it all". Emotional support and encouragement offered to patient. Patient requested "something for anxiety and sleep". Per NP - Ijeoma, ordered vistaril 25 mg and trazodone 50 mg. When this writer told patient about the medication order, patient angrily hit the medication window and the wall and said to this writer "forget it" walking back to his room, he stated "I don't need the medication, I just want to be left alone" After about 2 hours, patient was seen sleeping. Every 15 minutes check for safety maintained. Will continue to monitor patient for safety and stability.

## 2014-06-08 NOTE — Progress Notes (Signed)
Nutrition Brief Note  Patient identified on the Malnutrition Screening Tool (MST) Report  Pt with recent weight gain.  Wt Readings from Last 15 Encounters:  06/06/14 182 lb (82.555 kg)  05/18/14 163 lb (73.936 kg)  01/27/14 166 lb (75.297 kg)  06/04/13 180 lb (81.647 kg)  07/21/11 185 lb (83.915 kg)  06/19/11 198 lb (89.812 kg)  05/23/11 192 lb (87.091 kg)    Body mass index is 25.74 kg/(m^2). Patient meets criteria for overweight based on current BMI.   Diet Order: Diet regular Pt is also offered choice of unit snacks mid-morning and mid-afternoon.  Pt is eating as desired.   Labs and medications reviewed.   No nutrition interventions warranted at this time. If nutrition issues arise, please consult RD.   Tilda FrancoLindsey Sima Lindenberger, MS, RD, LDN Pager: [phone] After Hours Pager: [phone]

## 2014-06-09 ENCOUNTER — Other Ambulatory Visit (HOSPITAL_COMMUNITY): Payer: Self-pay

## 2014-06-09 LAB — TESTOSTERONE: Testosterone: 414 ng/dL (ref 300–890)

## 2014-06-09 MED ORDER — TESTOSTERONE CYPIONATE 200 MG/ML IM SOLN
100.0000 mg | INTRAMUSCULAR | Status: DC
  Administered 2014-06-09: 100 mg via INTRAMUSCULAR
  Filled 2014-06-09: qty 1

## 2014-06-09 MED ORDER — PRAZOSIN HCL 2 MG PO CAPS
2.0000 mg | ORAL_CAPSULE | Freq: Every day | ORAL | Status: DC
  Administered 2014-06-09 – 2014-06-12 (×4): 2 mg via ORAL
  Filled 2014-06-09 (×5): qty 1

## 2014-06-09 MED ORDER — CLONAZEPAM 1 MG PO TABS: 1.0000 mg | ORAL_TABLET | Freq: Four times a day (QID) | ORAL | Status: DC

## 2014-06-09 MED ORDER — CHLORPROMAZINE HCL 100 MG PO TABS: 100.0000 mg | ORAL_TABLET | Freq: Every day | ORAL | Status: DC

## 2014-06-09 MED ORDER — CHLORPROMAZINE HCL 100 MG PO TABS: 100.0000 mg | ORAL_TABLET | Freq: Every evening | ORAL | Status: DC | PRN

## 2014-06-09 MED ORDER — CHLORPROMAZINE HCL 100 MG PO TABS
100.0000 mg | ORAL_TABLET | Freq: Every day | ORAL | Status: DC
  Administered 2014-06-09: 100 mg via ORAL
  Filled 2014-06-09 (×3): qty 1

## 2014-06-09 MED ORDER — PRAZOSIN HCL 2 MG PO CAPS
2.0000 mg | ORAL_CAPSULE | Freq: Every day | ORAL | Status: DC
  Filled 2014-06-09 (×2): qty 1

## 2014-06-09 MED ORDER — CLONAZEPAM 1 MG PO TABS
1.0000 mg | ORAL_TABLET | Freq: Four times a day (QID) | ORAL | Status: DC
  Administered 2014-06-09 – 2014-06-23 (×55): 1 mg via ORAL
  Filled 2014-06-09 (×56): qty 1

## 2014-06-09 NOTE — Progress Notes (Addendum)
Nurse talked to [place] at phone [phone], [facility].  G[place]adaBrittany stated to call her back at [phone] (not [facility]) to set up CT head WO contrast per MD order.  G[place]adaBrittany stated to bring patient through ER that it would be cheaper for the patient, and to let ER handle the registration.  Second shift nurse and [facility]  Informed.  G[place]adaBrittany informed that patient will be attended by [facility] for safety.  Staff not sure if patient will be able to go tonight for CT, may need to send patient in the morning for CT.   [facility] requested that nurse call and let them know if patient will be coming tonight or in the morning for CT.

## 2014-06-09 NOTE — Progress Notes (Signed)
Recreation Therapy Notes  Animal-Assisted Activity/Therapy (AAA/T) Program Checklist/Progress Notes Patient Eligibility Criteria Checklist & Daily Group note for Rec Tx Intervention  Date: 03.16.2016 Time: 2:45pm Location: [address] Dayroom   AAA/T Program Assumption of Risk Form signed by Patient/ or Parent Legal Guardian yes  Patient is free of allergies or sever asthma yes  Patient reports no fear of animals yes  Patient reports no history of cruelty to animals yes  Patient understands his/her participation is voluntary yes  Patient washes hands before animal contact yes  Patient washes hands after animal contact yes  Behavioral Response: Did not attend.   Haelie Clapp L Levell Tavano, LRT/CTRS  Deadrian Toya L 06/09/2014 5:07 PM 

## 2014-06-09 NOTE — Progress Notes (Signed)
The focus of this group is to educate the patient on the purpose and policies of crisis stabilization and provide a format to answer questions about their admission.  The group details unit policies and expectations of patients while admitted.  Patient did not attend 0900 nurse education orientation group this morning.  Patient stayed in bed sleeping.    

## 2014-06-09 NOTE — [facility] Group Notes (Signed)
[facility] LCSW Group Therapy  06/09/2014 1:42 PM  Type of Therapy:  Group Therapy  Participation Level:  Did Not Attend-pt invited. Chose to stay in bed. Pt continues to be resistant to group attendance.   Summary of Progress/Problems: MHA Speaker came to talk about his personal journey with substance abuse and addiction. The pt processed ways by which to relate to the speaker. MHA speaker provided handouts and educational information pertaining to groups and services offered by [facility].   Smart, Subhan Hoopes LCSWA 06/09/2014, 1:42 PM

## 2014-06-09 NOTE — Progress Notes (Signed)
D:  Patient's self inventory sheet, patient has poor sleep, sleep medication is not helpful.  Fair appetite, low energy level, poor concentration.  Rated depression, hopeless, anxiety #10.  Has felt agitated in past 24 hours.  SI, contracts for safety.  Cannot hurt himself in here.  SI thoughts are for after discharge.  Physical problems of headache, blurred vision in past 24 hours.  Denied physical pain.   A:  Medications administered per MD orders.  Emotional support and encouragement given patient. R:  Denied SI and HI, contracts for safety.  Denied A/V hallucinations.  Safety maintained with 15 minute checks. Patient stated "Librium is barely working, does not want klonipin because it's not helping."  Stated he slept between midnight and 5:00 a.m., but kept waking up many times.

## 2014-06-09 NOTE — Progress Notes (Signed)
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 States that he does not know why he is having this going on now. States he has no hope, what he was ordered to do there will not be forgiven asks if he is going to be able to see God and if I think God will be ever able to forgive him. States that he has made his mind and he wants to get himself out of this suffering. He will kill himself. 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 States that chemicals when they were used and that the government is going to deny it. States that innoccent people were killed. ( cries during the session) The Librium he feels is not holding him, states that Klonopin did a better job. States he did not sleep last night either, states he is extremely tired wants some relief. He admits he is paranoid feel uneasy around the other patients does not want to go to the cafeteria Principal Problem: MDD (major depressive disorder), recurrent, severe, with psychosis Diagnosis:   Patient Active Problem List   Diagnosis Date Noted  . Major depression [F32.2] 06/06/2014  . Suicidal ideations [R45.851] 06/05/2014  . Panic disorder [F41.0] 05/19/2014  . MDD (major depressive disorder), recurrent, severe, with psychosis [F33.3] 05/18/2014  . PTSD (post-traumatic stress disorder) [F43.10] 01/29/2014  . Generalized anxiety disorder [F41.1] 06/19/2011    Class: Chronic   Total Time spent with patient: 45 minutes   Past Medical History:  Past Medical History  Diagnosis Date  . Anxiety   . Depression   . PTSD (post-traumatic stress disorder)     Past Surgical History  Procedure Laterality Date  . Cholesterol     Family History:  Family History  Problem Relation Age of Onset  . Cancer Mother   . Cancer  Father   . Depression Father    Social History:  History  Alcohol Use No     History  Drug Use No    Comment: denies    History   Social History  . Marital Status: Married    Spouse Name: N/A  . Number of Children: N/A  . Years of Education: N/A   Social History Main Topics  . Smoking status: Current Every Day Smoker -- 1.00 packs/day for 19 years    Types: Cigarettes  . Smokeless tobacco: Not on file  . Alcohol Use: No  . Drug Use: No     Comment: denies  . Sexual Activity: Yes    Birth Control/ Protection: None   Other Topics Concern  . None   Social History Narrative   Additional History:    Sleep: Poor  Appetite:  Poor   Assessment:   Musculoskeletal: Strength & Muscle Tone: within normal limits Gait & Station: normal Patient leans: N/A   Psychiatric Specialty Exam: Physical Exam  Review of Systems  Constitutional: Positive for malaise/fatigue.  Eyes: Positive for blurred vision.  Respiratory: Negative.   Cardiovascular: Negative.   Gastrointestinal: Negative.   Genitourinary: Negative.   Musculoskeletal: Negative.   Skin: Negative.   Neurological: Positive for weakness and headaches.  Endo/Heme/Allergies: Negative.   Psychiatric/Behavioral: Positive for depression and suicidal ideas. The patient is nervous/anxious and has insomnia.     Blood  pressure 139/95, pulse 105, temperature 98 F (36.7 C), temperature source Oral, resp. rate 16, height 5' 10.5" (1.791 m), weight 82.555 kg (182 lb), SpO2 100 %.Body mass index is 25.74 kg/(m^2).  General Appearance: Fairly Groomed  Patent attorneyye Contact::  Fair  Speech:  Clear and Coherent, Slow and not spontaneous reserved guarded  Volume:  fluctuates at time hardly audible  Mood:  Anxious, Depressed, Dysphoric, Hopeless and Worthless  Affect:  Depressed, Labile and Tearful  Thought Process:  Coherent and Goal Directed  Orientation:  Full (Time, Place, and Person)  Thought Content:  symptoms events worries  concerns  Suicidal Thoughts:  Yes.  with intent/plan  Homicidal Thoughts:  No  Memory:  Immediate;   Fair Recent;   Fair Remote;   Fair  Judgement:  Fair  Insight:  Present  Psychomotor Activity:  Restlessness  Concentration:  Fair  Recall:  FiservFair  Fund of Knowledge:Fair  Language: Fair  Akathisia:  No  Handed:  Right  AIMS (if indicated):     Assets:  Physical Health  ADL's:  Intact  Cognition: WNL  Sleep:  Number of Hours: 5     Current Medications: Current Facility-Administered Medications  Medication Dose Route Frequency Provider Last Rate Last Dose  . acetaminophen (TYLENOL) tablet 650 mg  650 mg Oral Q4H PRN Kristeen MansFran E Hobson, NP      . alum & mag hydroxide-simeth (MAALOX/MYLANTA) 200-200-20 MG/5ML suspension 30 mL  30 mL Oral Q4H PRN Rachael FeeIrving A Ameen Mostafa, MD      . ARIPiprazole (ABILIFY) tablet 5 mg  5 mg Oral Daily Rachael FeeIrving A Merri Dimaano, MD   5 mg at 06/09/14 0802  . chlorproMAZINE (THORAZINE) tablet 100 mg  100 mg Oral QHS Rachael FeeIrving A Keontae Levingston, MD      . clonazePAM [facility](KLONOPIN) tablet 1 mg  1 mg Oral QID Rachael FeeIrving A Malania Gawthrop, MD   1 mg at 06/09/14 1624  . feeding supplement (ENSURE COMPLETE) (ENSURE COMPLETE) liquid 237 mL  237 mL Oral BID BM Rachael FeeIrving A Artha Stavros, MD   237 mL at 06/09/14 1500  . gabapentin (NEURONTIN) capsule 300 mg  300 mg Oral TID Rachael FeeIrving A Peighton Mehra, MD   300 mg at 06/09/14 1623  . hydrOXYzine (ATARAX/VISTARIL) tablet 25 mg  25 mg Oral Q6H PRN Worthy FlankIjeoma E Nwaeze, NP      . magnesium hydroxide (MILK OF MAGNESIA) suspension 30 mL  30 mL Oral Daily PRN Rachael FeeIrving A Chyrl Elwell, MD      . nicotine (NICODERM CQ - dosed in mg/24 hours) patch 21 mg  21 mg Transdermal Daily Kristeen MansFran E Hobson, NP   21 mg at 06/09/14 0800  . prazosin (MINIPRESS) capsule 2 mg  2 mg Oral QHS Rachael FeeIrving A Lester Crickenberger, MD      . testosterone cypionate (DEPOTESTOTERONE CYPIONATE) injection 100 mg  100 mg Intramuscular Q14 Days Rachael FeeIrving A Jessa Stinson, MD   100 mg at 06/09/14 1537    Lab Results:  Results for orders placed or performed during the hospital encounter  of 06/06/14 (from the past 48 hour(s))  Testosterone     Status: None   Collection Time: 06/09/14  6:25 AM  Result Value Ref Range   Testosterone 414 300 - 890 ng/dL    Comment: (NOTE)          Tanner Stage       Male              Male  I              < 30 ng/dL        < 10 ng/dL              II             < 150 ng/dL       < 30 ng/dL              III            100-320 ng/dL     < 35 ng/dL              IV             200-970 ng/dL     16-10 ng/dL              V/Adult        300-890 ng/dL     96-04 ng/dL Performed at [facility]     Physical Findings: AIMS: Facial and Oral Movements Muscles of Facial Expression: None, normal Lips and Perioral Area: None, normal Jaw: None, normal Tongue: None, normal,Extremity Movements Upper (arms, wrists, hands, fingers): None, normal Lower (legs, knees, ankles, toes): None, normal, Trunk Movements Neck, shoulders, hips: None, normal, Overall Severity Severity of abnormal movements (highest score from questions above): None, normal Incapacitation due to abnormal movements: None, normal Patient's awareness of abnormal movements (rate only patient's report): No Awareness, Dental Status Current problems with teeth and/or dentures?: No Does patient usually wear dentures?: No  CIWA:  CIWA-Ar Total: 1 COWS:  COWS Total Score: 3  Treatment Plan Summary: Daily contact with patient to assess and evaluate symptoms and progress in treatment and Medication management Major Depression severe: will continue to work with the Abilify and will explore using another antidepressant. Will D/C the Remeron, as is not helping with sleep and he feels might be producing activation PTSD: will allow to talk about the trauma and help process the events and the feelings associated with them Nightmares: will increase the Prazosin to 2 mg Insomnia: will D/C the Remeron the Ambien the Trazodone, will try Thorazine 100 mg HS His testosterone level  drawn in the AM is low normal but the one in the afternoon is really low and the free testosterone is in the very low normal range; will start testosterone replacement therapy ( PSA is WNL) Headaches, episodes of double vision: will order a Head CT Scan to R/O SOL, he has family history of lung cancer, an he himself has some nodules at the base of the lungs that they are observing Will work to keep the hope going on Suicidal ideas; his plan is to kill himself with a gun that he acquired already and where it is he will not disclose: "Why are you against me using the gun?"  Medical Decision Making:  Review of Psycho-Social Stressors (1), Review or order clinical lab tests (1), Established Problem, Worsening (2), Review of Medication Regimen & Side Effects (2) and Review of New Medication or Change in Dosage (2)     Thirza Pellicano A 06/09/2014, 4:32 PM

## 2014-06-09 NOTE — Progress Notes (Signed)
Nurse has attempted to call [facility] phone [phone],  [facility]  [phone] [facility].  Need to set up CT Head WO Contrast per MD order.   Will continue to contact Radiology for patient's CT.

## 2014-06-09 NOTE — Plan of Care (Signed)
Problem: Consults Goal: Anxiety Disorder Patient Education See Patient Education Module for eduction specifics.  Outcome: Completed/Met Date Met:  06/09/14 Nurse discussed anxiety with patient.

## 2014-06-10 ENCOUNTER — Ambulatory Visit (HOSPITAL_COMMUNITY)
Admit: 2014-06-10 | Discharge: 2014-06-10 | Disposition: A | Discharge status: Home / Self Care | Payer: Self-pay | Attending: Psychiatry | Admitting: Psychiatry

## 2014-06-10 ENCOUNTER — Encounter (HOSPITAL_COMMUNITY): Payer: Self-pay

## 2014-06-10 DIAGNOSIS — H538 Other visual disturbances: Secondary | ICD-10-CM | POA: Insufficient documentation

## 2014-06-10 DIAGNOSIS — R51 Headache: Secondary | ICD-10-CM | POA: Insufficient documentation

## 2014-06-10 LAB — PSA: PSA: 0.9 ng/mL (ref <=4.00)

## 2014-06-10 MED ORDER — TRAZODONE HCL 50 MG PO TABS
50.0000 mg | ORAL_TABLET | Freq: Once | ORAL | Status: AC
  Administered 2014-06-10: 50 mg via ORAL
  Filled 2014-06-10 (×2): qty 1

## 2014-06-10 MED ORDER — CALCIUM CARBONATE ANTACID 500 MG PO CHEW
400.0000 mg | CHEWABLE_TABLET | Freq: Three times a day (TID) | ORAL | Status: DC
  Administered 2014-06-11 – 2014-06-18 (×14): 400 mg via ORAL
  Filled 2014-06-10 (×4): qty 2
  Filled 2014-06-10: qty 1
  Filled 2014-06-10 (×3): qty 2
  Filled 2014-06-10: qty 1
  Filled 2014-06-10 (×3): qty 2
  Filled 2014-06-10: qty 1
  Filled 2014-06-10 (×5): qty 2
  Filled 2014-06-10: qty 1
  Filled 2014-06-10 (×3): qty 2
  Filled 2014-06-10: qty 1
  Filled 2014-06-10 (×11): qty 2
  Filled 2014-06-10: qty 1

## 2014-06-10 MED ORDER — ZOLPIDEM TARTRATE 10 MG PO TABS
10.0000 mg | ORAL_TABLET | Freq: Every evening | ORAL | Status: DC | PRN
  Administered 2014-06-10 – 2014-06-22 (×13): 10 mg via ORAL
  Filled 2014-06-10 (×13): qty 1

## 2014-06-10 MED ORDER — THIORIDAZINE HCL 50 MG PO TABS
100.0000 mg | ORAL_TABLET | Freq: Every day | ORAL | Status: DC
  Administered 2014-06-10 – 2014-06-12 (×3): 100 mg via ORAL
  Filled 2014-06-10 (×2): qty 2
  Filled 2014-06-10: qty 1
  Filled 2014-06-10: qty 2
  Filled 2014-06-10 (×2): qty 1

## 2014-06-10 NOTE — Plan of Care (Signed)
Problem: Alteration in mood Goal: STG-Patient is able to discuss feelings and issues (Patient is able to discuss feelings and issues leading to depression)  Outcome: Progressing  Pt open about night-terrors and experiences in the war that has led to his depression.

## 2014-06-10 NOTE — Progress Notes (Signed)
Recreation Therapy Notes  Date: 03.16.2016 Time: 9:30am Location: 300 Hall Group Room   Group Topic: Stress Management  Goal Area(s) Addresses:  Patient will actively participate in stress management techniques presented during session.   Behavioral Response: Did not attend.   Beza Steppe L Aalina Brege, LRT/CTRS  Marene Gilliam L 06/10/2014 2:32 PM 

## 2014-06-10 NOTE — Progress Notes (Signed)
Pt got up, irate, stating that he could not sleep and that he would not return to his room because of his roommate's snoring.  Pt told MHT that he would sit at the desk area in the hall.  MHT informed RN who spoke with PA and received a one time order for trazodone 50 mg.  Pt was also given his prn Vistaril 25 mg at that time.  Writer spent a few minutes talking with pt.  He had calmed down by that time.  He was agreeable to moving into another room.  Pt voiced no other needs.  Continue q15 minute checks for safety.

## 2014-06-10 NOTE — [facility] Group Notes (Signed)
[facility]   06/10/2014 9:32 AM  Participation Quality:  Minimal   Mood/Affect:  Depressed and Flat  Depression Rating:  10  Anxiety Rating:  10  Thoughts of Suicide:  Yes Will you contract for safety?   Yes  Current AVH:  No  Plan for Discharge/Comments:  Pt presents with depressed mood/flat affect. Reports hopelessness as 10 and poor sleep "but that's nothing new." Pt reports good appetite. Pt states that he was recently put on thorazine but cannot tell difference and continues to present as despondent and hopeless. Pt states that he cannot think about d/c plan or aftercare "it's not something I can think about." (Pt is on waitlist for [facility] as of 06/09/14).   Transportation Means: unknown   Supports: pt denies any current supports   Counselling psychologistmart, American FinancialHeather LCSWA

## 2014-06-10 NOTE — Progress Notes (Addendum)
[name], male   DOB: [birth year], 44 y.o.   MRN: [id number]  Pt currently presents with a flat affect and depressed behavior. Per self inventory, pt rates depression, hopelessness and anxiety at a 10. Pt's daily goal is to "sleep more" and they intend to do so by "speak with doctor." Pt reports poor sleep, low concentration and a fair appetite.   Pt provided with medications per providers orders. Pt's labs and vitals were monitored throughout the day. Pt supported emotionally and encouraged to express concerns and questions. Pt educated on medications, relaxation methods and sleep patterns. Pt given a 1:1 with Clinical research associatewriter. Pt sent for a CT scan today due to "blurry vision" and "some headache."  Pt's safety ensured with 15 minute and environmental checks. Pt currently denies SI/HI and A/V hallucinations. Pt verbally agrees to seek staff if SI/HI or A/VH occurs and to consult with staff before acting on these thoughts. Pt helpless and hopeless today. Pt reports "I don't have any friends, it's probably better so I don't affect them." Pt reports heartburn throughout the day. MD notified.

## 2014-06-10 NOTE — Plan of Care (Signed)
Problem: Alteration in mood; excessive anxiety as evidenced by: Goal: STG-Pt can identify how behaviors/thoughts can (Patient can identify how behaviors/thoughts can increase and decrease anxiety)  Outcome: Not Progressing Pt unable to associate how thoughts/behavior change could decrease his anxiety, pt focuses on his medications.

## 2014-06-10 NOTE — Progress Notes (Signed)
Pt did not go over for the CT tonight as there was not a tech available to accompany him.  Will inform oncoming shift of need to complete the CT.  Pt reports he does not feel any improvement from the meds he has been taking.  He did have some med changes today and took thorazine 100 mg tonight.  He is hopeful to find something to help him rest.  Pt said he attended some groups today.  He is unsure of his discharge plans.  Support and encouragement offered.  Safety maintained with q15 minute checks.

## 2014-06-10 NOTE — Progress Notes (Signed)
[facility]  06/10/2014 6:13 PM Trey SailorsSteven Nicholls  MRN:  [id number] Subjective:  [name] endorses that he did not sleep well last night either. States he cant shot his brain down. He does say that he is not having the nightmares when he gets to sleep. 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 States they have  not been this strong that since he can remember. 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 Now he cant shot them down. He still endorses he is feeling hopeless helpless and that if he was out of here right now he would shoot himself.  Principal Problem: MDD (major depressive disorder), recurrent, severe, with psychosis Diagnosis:   Patient Active Problem List   Diagnosis Date Noted  . Major depression [F32.2] 06/06/2014  . Suicidal ideations [R45.851] 06/05/2014  . Panic disorder [F41.0] 05/19/2014  . MDD (major depressive disorder), recurrent, severe, with psychosis [F33.3] 05/18/2014  . PTSD (post-traumatic stress disorder) [F43.10] 01/29/2014  . Generalized anxiety disorder [F41.1] 06/19/2011    Class: Chronic   Total Time spent with patient: 30 minutes   Past Medical History:  Past Medical History  Diagnosis Date  . Anxiety   . Depression   . PTSD (post-traumatic stress disorder)     Past Surgical History  Procedure Laterality Date  . Cholesterol     Family History:  Family History  Problem Relation Age of Onset  . Cancer Mother   . Cancer Father   . Depression Father    Social History:  History  Alcohol Use No     History  Drug Use No    Comment: denies    History   Social History  . Marital Status: Married    Spouse Name: N/A  . Number of Children: N/A  . Years of Education: N/A   Social History Main Topics  . Smoking status: Current Every Day Smoker -- 1.00 packs/day for 19 years    Types:  Cigarettes  . Smokeless tobacco: Not on file  . Alcohol Use: No  . Drug Use: No     Comment: denies  . Sexual Activity: Yes    Birth Control/ Protection: None   Other Topics Concern  . None   Social History Narrative   Additional History:    Sleep: Poor  Appetite:  Fair   Assessment:   Musculoskeletal: Strength & Muscle Tone: within normal limits Gait & Station: normal Patient leans: N/A   Psychiatric Specialty Exam: Physical Exam  Review of Systems  Constitutional: Positive for malaise/fatigue.  HENT: Negative.   Eyes: Negative.   Respiratory: Negative.   Cardiovascular: Negative.   Gastrointestinal: Positive for heartburn.  Genitourinary: Negative.   Musculoskeletal: Negative.   Skin: Negative.   Neurological: Negative.   Endo/Heme/Allergies: Negative.   Psychiatric/Behavioral: Positive for depression and suicidal ideas. The patient is nervous/anxious and has insomnia.     Blood pressure 122/84, pulse 106, temperature 98.3 F (36.8 C), temperature source Oral, resp. rate 17, height 5' 10.5" (1.791 m), weight 82.555 kg (182 lb), SpO2 100 %.Body mass index is 25.74 kg/(m^2).  General Appearance: Fairly Groomed  Patent attorneyye Contact::  Fair  Speech:  Clear and Coherent  Volume:  Decreased  Mood:  Anxious, Depressed and worried  Affect:  Restricted  Thought Process:  Coherent and Goal Directed  Orientation:  Full (Time,  Place, and Person)  Thought Content:  symptoms events worries concerns  Suicidal Thoughts:  Yes.  with intent/plan  Homicidal Thoughts:  No  Memory:  Immediate;   Fair Recent;   Fair Remote;   Fair  Judgement:  Fair  Insight:  Present  Psychomotor Activity:  Restlessness  Concentration:  Fair  Recall:  [employer] of Knowledge:Fair  Language: Fair  Akathisia:  No  Handed:  Right  AIMS (if indicated):     Assets:  Physical health  ADL's:  Intact  Cognition: WNL  Sleep:  Number of Hours: 5     Current Medications: Current  Facility-Administered Medications  Medication Dose Route Frequency Provider Last Rate Last Dose  . acetaminophen (TYLENOL) tablet 650 mg  650 mg Oral Q4H PRN Kristeen Mans, NP      . alum & mag hydroxide-simeth (MAALOX/MYLANTA) 200-200-20 MG/5ML suspension 30 mL  30 mL Oral Q4H PRN Rachael Fee, MD   30 mL at 06/10/14 1651  . ARIPiprazole (ABILIFY) tablet 5 mg  5 mg Oral Daily Rachael Fee, MD   5 mg at 06/10/14 5409  . [START ON 06/11/2014] calcium carbonate (TUMS - dosed in mg elemental calcium) chewable tablet 400 mg of elemental calcium  400 mg of elemental calcium Oral TID WC Rachael Fee, MD      . clonazePAM Scarlette Calico) tablet 1 mg  1 mg Oral QID Rachael Fee, MD   1 mg at 06/10/14 1812  . feeding supplement (ENSURE COMPLETE) (ENSURE COMPLETE) liquid 237 mL  237 mL Oral BID BM Rachael Fee, MD   237 mL at 06/10/14 1405  . gabapentin (NEURONTIN) capsule 300 mg  300 mg Oral TID Rachael Fee, MD   300 mg at 06/10/14 1652  . hydrOXYzine (ATARAX/VISTARIL) tablet 25 mg  25 mg Oral Q6H PRN Worthy Flank, NP   25 mg at 06/10/14 0031  . magnesium hydroxide (MILK OF MAGNESIA) suspension 30 mL  30 mL Oral Daily PRN Rachael Fee, MD      . nicotine (NICODERM CQ - dosed in mg/24 hours) patch 21 mg  21 mg Transdermal Daily Kristeen Mans, NP   21 mg at 06/10/14 8119  . prazosin (MINIPRESS) capsule 2 mg  2 mg Oral QHS Rachael Fee, MD   2 mg at 06/09/14 1946  . testosterone cypionate (DEPOTESTOTERONE CYPIONATE) injection 100 mg  100 mg Intramuscular Q14 Days Rachael Fee, MD   100 mg at 06/09/14 1537  . thioridazine (MELLARIL) tablet 100 mg  100 mg Oral QHS Rachael Fee, MD      . zolpidem [facility]) tablet 10 mg  10 mg Oral QHS PRN Rachael Fee, MD        Lab Results:  Results for orders placed or performed during the hospital encounter of 06/06/14 (from the past 48 hour(s))  PSA     Status: None   Collection Time: 06/09/14  6:25 AM  Result Value Ref Range   PSA 0.90 <=4.00 ng/mL    Comment:  (NOTE) Test Methodology: ECLIA PSA (Electrochemiluminescence Immunoassay) For PSA values from 2.5-4.0, particularly in younger men <58 years old, the AUA and NCCN suggest testing for % Free PSA (3515) and evaluation of the rate of increase in PSA (PSA velocity). Performed at [facility]   Testosterone     Status: None   Collection Time: 06/09/14  6:25 AM  Result Value Ref Range   Testosterone 414  300 - 890 ng/dL    Comment: (NOTE)          Tanner Stage       Male              Male              I              < 30 ng/dL        < 10 ng/dL              II             < 150 ng/dL       < 30 ng/dL              III            100-320 ng/dL     < 35 ng/dL              IV             200-970 ng/dL     16-10 ng/dL              V/Adult        300-890 ng/dL     96-04 ng/dL Performed at [facility]     Physical Findings: AIMS: Facial and Oral Movements Muscles of Facial Expression: None, normal Lips and Perioral Area: None, normal Jaw: None, normal Tongue: None, normal,Extremity Movements Upper (arms, wrists, hands, fingers): None, normal Lower (legs, knees, ankles, toes): None, normal, Trunk Movements Neck, shoulders, hips: None, normal, Overall Severity Severity of abnormal movements (highest score from questions above): None, normal Incapacitation due to abnormal movements: None, normal Patient's awareness of abnormal movements (rate only patient's report): No Awareness, Dental Status Current problems with teeth and/or dentures?: No Does patient usually wear dentures?: No  CIWA:  CIWA-Ar Total: 1 COWS:  COWS Total Score: 3  Treatment Plan Summary: Daily contact with patient to assess and evaluate symptoms and progress in treatment and Medication management Major Depression severe: will continue to work with the Abilify ( he has tolerated it while not able to tolerate other medications ( antidepressants)) PTSD; will continue the Prazosin and help process the trauma  as he is comfortable doing  Note: gets very overwhelmed and emotional when talking about it  Insomnia: states that he would like to try Mellaril as the Thorazine did not work for him. Will try Mellaril 100 mg HS Anxiety; states he is getting some relief from the Klonopin  CT Scan of the head: normal Will continue to try to instal hope and provide options to suicide ECT was discussed as a possible option for his depression  Medical Decision Making:  Review of Psycho-Social Stressors (1), Review or order clinical lab tests (1), Review of Medication Regimen & Side Effects (2) and Review of New Medication or Change in Dosage (2)     Joe Gee A 06/10/2014, 6:13 PM

## 2014-06-11 MED ORDER — CARBAMAZEPINE ER 200 MG PO CP12
200.0000 mg | ORAL_CAPSULE | Freq: Two times a day (BID) | ORAL | Status: DC
  Administered 2014-06-11 – 2014-06-23 (×25): 200 mg via ORAL
  Filled 2014-06-11 (×30): qty 1

## 2014-06-11 NOTE — Progress Notes (Signed)
[facility] MD Progress Note  06/11/2014 11:52 AM Phillip [name]  MRN:  [id number] Subjective:  Olivier states he was able to sleep with the Mellaril/Ambien/Prazosin combination. He states that he wants to have hope. He had a positive interaction with his GF last night. He states he is willing to do whatever is suggested to get better. He still endorses SI at this time he can contract for safety Principal Problem: MDD (major depressive disorder), recurrent, severe, with psychosis Diagnosis:   Patient Active Problem List   Diagnosis Date Noted  . Major depression [F32.2] 06/06/2014  . Suicidal ideations [R45.851] 06/05/2014  . Panic disorder [F41.0] 05/19/2014  . MDD (major depressive disorder), recurrent, severe, with psychosis [F33.3] 05/18/2014  . PTSD (post-traumatic stress disorder) [F43.10] 01/29/2014  . Generalized anxiety disorder [F41.1] 06/19/2011    Class: Chronic   Total Time spent with patient: 30 minutes   Past Medical History:  Past Medical History  Diagnosis Date  . Anxiety   . Depression   . PTSD (post-traumatic stress disorder)     Past Surgical History  Procedure Laterality Date  . Cholesterol     Family History:  Family History  Problem Relation Age of Onset  . Cancer Mother   . Cancer Father   . Depression Father    Social History:  History  Alcohol Use No     History  Drug Use No    Comment: denies    History   Social History  . Marital Status: Married    Spouse Name: N/A  . Number of Children: N/A  . Years of Education: N/A   Social History Main Topics  . Smoking status: Current Every Day Smoker -- 1.00 packs/day for 19 years    Types: Cigarettes  . Smokeless tobacco: Not on file  . Alcohol Use: No  . Drug Use: No     Comment: denies  . Sexual Activity: Yes    Birth Control/ Protection: None   Other Topics Concern  . None   Social History Narrative   Additional History:    Sleep: better last night  Appetite:  Fair   Assessment:    Musculoskeletal: Strength & Muscle Tone: within normal limits Gait & Station: normal Patient leans: N/A   Psychiatric Specialty Exam: Physical Exam  Review of Systems  Constitutional: Negative.   HENT: Negative.   Eyes: Negative.   Respiratory: Negative.   Cardiovascular: Negative.   Gastrointestinal: Negative.   Genitourinary: Negative.   Musculoskeletal: Negative.   Skin: Negative.   Neurological: Negative.   Endo/Heme/Allergies: Negative.   Psychiatric/Behavioral: Positive for depression and suicidal ideas. The patient is nervous/anxious.     Blood pressure 128/90, pulse 82, temperature 98.5 F (36.9 C), temperature source Oral, resp. rate 18, height 5' 10.5" (1.791 m), weight 82.555 kg (182 lb), SpO2 100 %.Body mass index is 25.74 kg/(m^2).  General Appearance: Fairly Groomed  Patent attorney::  Fair  Speech:  Clear and Coherent  Volume:  Normal  Mood:  Depressed  Affect:  Restricted  Thought Process:  Coherent and Goal Directed  Orientation:  Full (Time, Place, and Person)  Thought Content:  symptoms worries concerns  Suicidal Thoughts:  Yes.  without intent/plan  Homicidal Thoughts:  No  Memory:  Immediate;   Fair Recent;   Fair Remote;   Fair  Judgement:  Fair  Insight:  Present  Psychomotor Activity:  Restlessness  Concentration:  Fair  Recall:  [employer] of Knowledge:Fair  Language: Fair  Akathisia:  No  Handed:  Right  AIMS (if indicated):     Assets:  Desire for Improvement  ADL's:  Intact  Cognition: WNL  Sleep:  Number of Hours: 6.25     Current Medications: Current Facility-Administered Medications  Medication Dose Route Frequency Provider Last Rate Last Dose  . acetaminophen (TYLENOL) tablet 650 mg  650 mg Oral Q4H PRN Kristeen MansFran E Hobson, NP      . alum & mag hydroxide-simeth (MAALOX/MYLANTA) 200-200-20 MG/5ML suspension 30 mL  30 mL Oral Q4H PRN Rachael FeeIrving A Lendon George, MD   30 mL at 06/10/14 1651  . ARIPiprazole (ABILIFY) tablet 5 mg  5 mg Oral Daily  Rachael FeeIrving A Hyde Sires, MD   5 mg at 06/11/14 [id number]  . calcium carbonate (TUMS - dosed in mg elemental calcium) chewable tablet 400 mg of elemental calcium  400 mg of elemental calcium Oral TID WC Rachael FeeIrving A Yesica Kemler, MD   400 mg of elemental calcium at 06/11/14 1150  . carbamazepine (EQUETRO) 12 hr capsule 200 mg  200 mg Oral BID Rachael FeeIrving A Alexismarie Flaim, MD   200 mg at 06/11/14 1149  . clonazePAM (KLONOPIN) tablet 1 mg  1 mg Oral QID Rachael FeeIrving A Azyah Flett, MD   1 mg at 06/11/14 1101  . feeding supplement (ENSURE COMPLETE) (ENSURE COMPLETE) liquid 237 mL  237 mL Oral BID BM Rachael FeeIrving A Annalynn Centanni, MD   237 mL at 06/11/14 1059  . gabapentin (NEURONTIN) capsule 300 mg  300 mg Oral TID Rachael FeeIrving A Kealohilani Maiorino, MD   300 mg at 06/11/14 1148  . hydrOXYzine (ATARAX/VISTARIL) tablet 25 mg  25 mg Oral Q6H PRN Worthy FlankIjeoma E Nwaeze, NP   25 mg at 06/10/14 0031  . magnesium hydroxide (MILK OF MAGNESIA) suspension 30 mL  30 mL Oral Daily PRN Rachael FeeIrving A Braxon Suder, MD      . nicotine (NICODERM CQ - dosed in mg/24 hours) patch 21 mg  21 mg Transdermal Daily Kristeen MansFran E Hobson, NP   21 mg at 06/11/14 0811  . prazosin (MINIPRESS) capsule 2 mg  2 mg Oral QHS Rachael FeeIrving A Tanvir Hipple, MD   2 mg at 06/10/14 2008  . testosterone cypionate (DEPOTESTOTERONE CYPIONATE) injection 100 mg  100 mg Intramuscular Q14 Days Rachael FeeIrving A Nolawi Kanady, MD   100 mg at 06/09/14 1537  . thioridazine (MELLARIL) tablet 100 mg  100 mg Oral QHS Rachael FeeIrving A Chibuikem Thang, MD   100 mg at 06/10/14 2012  . zolpidem (AMBIEN) tablet 10 mg  10 mg Oral QHS PRN Rachael FeeIrving A Rumeal Cullipher, MD   10 mg at 06/10/14 2200    Lab Results: No results found for this or any previous visit (from the past 48 hour(s)).  Physical Findings: AIMS: Facial and Oral Movements Muscles of Facial Expression: None, normal Lips and Perioral Area: None, normal Jaw: None, normal Tongue: None, normal,Extremity Movements Upper (arms, wrists, hands, fingers): None, normal Lower (legs, knees, ankles, toes): None, normal, Trunk Movements Neck, shoulders, hips: None, normal, Overall  Severity Severity of abnormal movements (highest score from questions above): None, normal Incapacitation due to abnormal movements: None, normal Patient's awareness of abnormal movements (rate only patient's report): No Awareness, Dental Status Current problems with teeth and/or dentures?: No Does patient usually wear dentures?: No  CIWA:  CIWA-Ar Total: 1 COWS:  COWS Total Score: 3  Treatment Plan Summary: Daily contact with patient to assess and evaluate symptoms and progress in treatment and Medication management Major Depression recurrent severe: continue to work with the Abilify as an antidepressant PTSD; continue to  work with the Prazosin for the nightmares Mood Instability; will have a trial with Equetro it could also help the mood component of the PTSD Insomnia; will continue to use the Mellaril/Ambien/Prazosin combination Anxiety; the Klonopin 1 mg QID seems to be effective right now At this time polypharmacy is indicated to help get the acute symptoms under control. Once he is better medication regime could be simplified As an option if he was not to tolerate the Equetro a smaller dose of Mellaril during the day could help Suicidal ideas; continue to work to [employer]. He is still contracting for safety Medical Decision Making:  Review of Psycho-Social Stressors (1), Review of Medication Regimen & Side Effects (2) and Review of New Medication or Change in Dosage (2)     Angelli Baruch A 06/11/2014, 11:52 AM

## 2014-06-11 NOTE — [facility] Group Notes (Signed)
[facility] LCSW Group Therapy  06/11/2014 3:25 PM  Type of Therapy:  Group Therapy  Participation Level:  Minimal  Participation Quality:  Attentive  Affect:  Appropriate  Cognitive:  Alert and Oriented  Insight:  Limited  Engagement in Therapy:  Limited  Modes of Intervention:  Confrontation, Discussion, Education, Exploration, Problem-solving, Rapport Building, Socialization and Support  Summary of Progress/Problems: Today's Topic: Overcoming Obstacles. Pt identified obstacles faced currently and processed barriers involved in overcoming these obstacles. Pt identified steps necessary for overcoming these obstacles and explored motivation (internal and external) for facing these difficulties head on. Pt further identified one area of concern in their lives and chose a skill of focus pulled from their "toolbox." [name] attended today's processing group and actively listened as others shared. [name] asked to speak about his obstacles in private with CSW but offered emotional support and encouragement to others throughout group. He continues to show progress in the group setting and some insight into his current obstacles. Pt is unable to identify goals currently, stating that he still feels like if he were to d/c, "I would shoot myself. I can't think of anything past that."    Smart, Stephanye Finnicum LCSWA  06/11/2014, 3:25 PM

## 2014-06-11 NOTE — Progress Notes (Signed)
Recreation Therapy Notes  Animal-Assisted Activity/Therapy (AAA/T) Program Checklist/Progress Notes Patient Eligibility Criteria Checklist & Daily Group note for Rec Tx Intervention  Date: 03.17.2016 Time: 2:45pm Location: [address] Dayroom   AAA/T Program Assumption of Risk Form signed by Patient/ or Parent Legal Guardian yes  Patient is free of allergies or sever asthma yes  Patient reports no fear of animals yes  Patient reports no history of cruelty to animals yes  Patient understands his/her participation is voluntary yes  Patient washes hands before animal contact yes  Patient washes hands after animal contact yes  Behavioral Response: Attentive, Appropriate   Education: Hand Washing, Appropriate Animal Interaction   Education Outcome: Acknowledges education.   Clinical Observations/Feedback: Patient interacted appropriately with dog team and peers during session.   [name] Staley L Hamna Asa, LRT/CTRS  Noel Henandez L 06/11/2014 5:25 PM 

## 2014-06-11 NOTE — Progress Notes (Signed)
D   Pt is pleasant on approach and cooperative  He interacts appropriately and attends and participates in groups   He contracts for safety   He endorses depression and anxiety but reports some improvement since hospitalization  His biggest complaint is having trouble sleeping A   Verbal support given   Medications administered and effectiveness monitored   Q 15 min checks R   Pt safe at present  Pt reports sleeping better last night   He is superficial and overly cheerful  He told the previous nurse he was still suicidal and planned his death after he left the hospital but did not endorse suicidal ideation with this staff person   He did attend group after receiving encouragement   Pt received medications and verbal support given   Pt is safe at present

## 2014-06-11 NOTE — Progress Notes (Signed)
D   Pt is pleasant on approach and cooperative  He interacts appropriately and attends and participates in groups   He contracts for safety   He endorses depression and anxiety but reports some improvement since hospitalization  His biggest complaint is having trouble sleeping A   Verbal support given   Medications administered and effectiveness monitored   Q 15 min checks R   Pt safe at present

## 2014-06-11 NOTE — Clinical Social Work Note (Signed)
CSW [facility] this [facility] to verify that pt is still on waiting list. Pt is on waiting list as of 06/09/14. Junious DresserConnie requested updated progress notes. CSW faxed updated notes this morning.  The Sherwin-WilliamsHeather Smart, LCSWA 06/11/2014 9:16 AM

## 2014-06-11 NOTE — Progress Notes (Signed)
[name], male   DOB: [birth year], 44 y.o.   MRN: [id number]   Pt currently presents with an animated affect and anxious behavior. Per self inventory, pt rates depression, hopelessness and anxiety at a 10. Pt's daily goal is to "feel better." Pt reports fair sleep, poor concentration and a fair appetite. Pt reports being "hopeless" and states "I'm here to find out how I can find some hope."  Pt provided with medications per providers orders. Pt's labs and vitals were monitored throughout the day. Pt supported emotionally and encouraged to express concerns and questions. Pt educated on medications and depression.  Pt's safety ensured with 15 minute and environmental checks. Pt currently denies HI and A/V hallucinations. Pt reports passive SI and states "I'm at peace, at peace with the fact that when I leave here I'm going to shoot myself. I want to meet and talk to God." Pt then states "I like it here, the doc said I could stay for a couple of months." Pt verbally agrees to seek staff if HI or A/VH occurs and to consult with staff before acting on any harmful thoughts.

## 2014-06-12 DIAGNOSIS — R45850 Homicidal ideations: Secondary | ICD-10-CM

## 2014-06-12 MED ORDER — PRAZOSIN HCL 1 MG PO CAPS
ORAL_CAPSULE | ORAL | Status: AC
  Administered 2014-06-12: 22:00:00
  Filled 2014-06-12: qty 1

## 2014-06-12 MED ORDER — PRAZOSIN HCL 2 MG PO CAPS
3.0000 mg | ORAL_CAPSULE | Freq: Every day | ORAL | Status: DC
  Administered 2014-06-13 – 2014-06-16 (×4): 3 mg via ORAL
  Filled 2014-06-12 (×8): qty 1

## 2014-06-12 MED ORDER — THIORIDAZINE HCL 50 MG PO TABS
50.0000 mg | ORAL_TABLET | Freq: Every day | ORAL | Status: DC
  Filled 2014-06-12 (×2): qty 1

## 2014-06-12 MED ORDER — PRAZOSIN HCL 2 MG PO CAPS: 3.0000 mg | ORAL_CAPSULE | Freq: Every day | ORAL | Status: DC

## 2014-06-12 NOTE — Progress Notes (Signed)
Phillip [name] is still quite depressed, he is flat, emotionless demonstrates minimal engagement in his recovery as evidenced by spending most of his energies on po meds.     A He contracts with this nurse for safety and on the daily assessment that he completed this morning he wrote  He denied SI today and he rated his depression, hopelessness and  Anxiety " 01/03/09/", respectively.  He is taking part in some of his groups, he attended his morning group this morning  And he requested and was  Given vistaril 25 mg , for c/o anxiety  At 0949.  He stated this was " much better " 30 min later.\

## 2014-06-12 NOTE — [facility] Group Notes (Signed)
[facility] LCSW Group Therapy  06/12/2014 3:12 PM  Type of Therapy:  Group Therapy  Participation Level:  Did Not Attend-pt chose to remain in room during group.   Summary of Progress/Problems: Feelings around Relapse. Group members discussed the meaning of relapse and shared personal stories of relapse, how it affected them and others, and how they perceived themselves during this time. Group members were encouraged to identify triggers, warning signs and coping skills used when facing the possibility of relapse. Social supports were discussed and explored in detail. Post Acute Withdrawal Syndrome (handout provided) was introduced and examined. Pt's were encouraged to ask questions, talk about key points associated with PAWS, and process this information in terms of relapse prevention.    Smart, Franny Selvage LCSWA  06/12/2014, 3:12 PM

## 2014-06-12 NOTE — Progress Notes (Signed)
D   Pt is pleasant on approach and cooperative  He interacts appropriately and attends and participates in groups   He contracts for safety   He endorses depression and anxiety but reports some improvement since hospitalization  His biggest complaint is having trouble sleeping A   Verbal support given   Medications administered and effectiveness monitored   Q 15 min checks R   Pt safe at present  Pt reports sleeping better last night   He is superficial and overly cheerful  He told the previous nurse he was still suicidal and planned his death after he left the hospital but did not endorse suicidal ideation with this staff person   He did attend group after receiving encouragement   Pt received medications and verbal support given   Pt is safe at present  Pt is somewhat irritable and was upset when the doctor decreased his medications    He attends groups and interacts appropriately with others   Verbal support and encouragement given   Q 15 min checks   Medications administered and effectiveness monitored   Pt is safe at present

## 2014-06-12 NOTE — [facility] Group Notes (Signed)
[facility]   06/12/2014 10:17 AM  Participation Quality:  Appropriate   Mood/Affect:  Appropriate  Depression Rating:  10  Anxiety Rating:  10  Thoughts of Suicide:  Yes Will you contract for safety?   Yes  Current AVH:  No  Plan for Discharge/Comments:  Pt reports that he is passively SI but can contract for safety on unit. Pt calm and pleasant during group. Reports improved sleep but no mood improvement. [facility] and agrees that this would be best "If I leave here, I know I will shoot myself."   Transportation Means: likely police transport.   Supports: none identified "I've pushed everyone away so that it won't hurt them when I kill myself."   Smart, American FinancialHeather LCSWA

## 2014-06-12 NOTE — [facility] Suicide Risk Assessment (Signed)
[facility] INPATIENT:  Family/Significant Other Suicide Prevention Education  Suicide Prevention Education:  Patient Refusal for Family/Significant Other Suicide Prevention Education: The patient Phillip [name] has refused to provide written consent for family/significant other to be provided Family/Significant Other Suicide Prevention Education during admission and/or prior to discharge.  Physician notified.  SPE has been completed with pt. He continues to refuse to allow family/friend contact for SPE or collateral information. Pt provided with SPI pamphlet and was encouraged to ask questions, talk about any concerns, and share information with his support network. Pt continues to endorse passive SI and is able to contract for safety on the unit. No HI reported.   Smart, [name] LCSWA 06/12/2014, 8:17 AM

## 2014-06-12 NOTE — Progress Notes (Signed)
[name], male   DOB: 02/26/1971, 44 y.o.   MRN: [id number] [facility] MD Progress Note  06/12/2014 4:30 PM Phillip [name]  MRN:  [id number]   Subjective:  Patient states he continues to have PTSD symptoms, such as " seeing the faces of people who were killed",and struggling with nightmares that make sleep difficult.  i He is still endorsing suicidal thoughts- states these are chronic-  but is able to contract for safety on the unit.   Objective: I have seen the patient and discussed care with the treatment team. On unit, patient's behavior has been calm and in good control. He has continued to report ongoing suicidal ideations, but has been able to contract for safety on unit, and has not exhibited any self injurious behavior. He presents as calm, superficially cooperative, polite. He describes chronic depression, hopelessness, and PTSD symptoms as above. Denies medication side effects. His major concern at present is poor sleep, which he attributes mostly to ongoing vivid nightmares relating to combat experiences . Responds fairly well to support, encouragement, empathy.   Principal Problem: MDD (major depressive disorder), recurrent, severe, with psychosis Diagnosis:   Patient Active Problem List   Diagnosis Date Noted  . Major depression [F32.2] 06/06/2014  . Suicidal ideations [R45.851] 06/05/2014  . Panic disorder [F41.0] 05/19/2014  . MDD (major depressive disorder), recurrent, severe, with psychosis [F33.3] 05/18/2014  . PTSD (post-traumatic stress disorder) [F43.10] 01/29/2014  . Generalized anxiety disorder [F41.1] 06/19/2011    Class: Chronic   Total Time spent with patient: 20 minutes   Past Medical History:  Past Medical History  Diagnosis Date  . Anxiety   . Depression   . PTSD (post-traumatic stress disorder)     Past Surgical History  Procedure Laterality Date  . Cholesterol     Family History:  Family History  Problem Relation Age of Onset  .  Cancer Mother   . Cancer Father   . Depression Father    Social History:  History  Alcohol Use No     History  Drug Use No    Comment: denies    History   Social History  . Marital Status: Married    Spouse Name: N/A  . Number of Children: N/A  . Years of Education: N/A   Social History Main Topics  . Smoking status: Current Every Day Smoker -- 1.00 packs/day for 19 years    Types: Cigarettes  . Smokeless tobacco: Not on file  . Alcohol Use: No  . Drug Use: No     Comment: denies  . Sexual Activity: Yes    Birth Control/ Protection: None   Other Topics Concern  . None   Social History Narrative   Additional History:    Sleep:  Reported as poor due to nightmares, but chart indicates slept 6 hours   Appetite:  Fair   Assessment:   Musculoskeletal: Strength & Muscle Tone: within normal limits Gait & Station: normal Patient leans: N/A   Psychiatric Specialty Exam: Physical Exam  Review of Systems  Constitutional: Negative for fever and chills.  Eyes: Negative.   Respiratory: Negative for shortness of breath.   Cardiovascular: Negative for chest pain.  Gastrointestinal: Negative for nausea, vomiting and abdominal pain.  Genitourinary: Negative for dysuria and urgency.  Skin: Negative for rash.  Neurological: Negative for headaches.  Psychiatric/Behavioral: Positive for depression and suicidal ideas.    Blood pressure 126/92, pulse 102, temperature 98 F (36.7 C), temperature source Oral, resp. rate 20,  height 5' 10.5" (1.791 m), weight 182 lb (82.555 kg), SpO2 100 %.Body mass index is 25.74 kg/(m^2).  General Appearance: Fairly Groomed  Patent attorneyye Contact::  Good  Speech:  Clear and Coherent  Volume:  Normal  Mood:  Depressed  Affect:  Restricted, superficial   Thought Process:  Coherent and Goal Directed  Orientation:  Full (Time, Place, and Person)  Thought Content:  No hallucinations, no delusions, not internally preoccupied  Suicidal Thoughts:  Yes.   without intent/plan- able to contract for safety on the unit   Homicidal Thoughts:  No  Memory:  Remote and recent grossly intact  Judgement:  Fair  Insight:  Present  Psychomotor Activity:  Normal  Concentration:  Good  Recall:  Good  Fund of Knowledge:Good  Language: Good  Akathisia:  No  Handed:  Right  AIMS (if indicated):     Assets:  Communication Skills Desire for Improvement Resilience  ADL's:  Intact  Cognition: WNL  Sleep:  Number of Hours: 6.25     Current Medications: Current Facility-Administered Medications  Medication Dose Route Frequency Provider Last Rate Last Dose  . acetaminophen (TYLENOL) tablet 650 mg  650 mg Oral Q4H PRN Kristeen MansFran E Hobson, NP      . alum & mag hydroxide-simeth (MAALOX/MYLANTA) 200-200-20 MG/5ML suspension 30 mL  30 mL Oral Q4H PRN Rachael FeeIrving A Lugo, MD   30 mL at 06/10/14 1651  . ARIPiprazole (ABILIFY) tablet 5 mg  5 mg Oral Daily Rachael FeeIrving A Lugo, MD   5 mg at 06/12/14 0947  . calcium carbonate (TUMS - dosed in mg elemental calcium) chewable tablet 400 mg of elemental calcium  400 mg of elemental calcium Oral TID WC Rachael FeeIrving A Lugo, MD   400 mg of elemental calcium at 06/12/14 1112  . carbamazepine (EQUETRO) 12 hr capsule 200 mg  200 mg Oral BID Rachael FeeIrving A Lugo, MD   200 mg at 06/12/14 0947  . clonazePAM (KLONOPIN) tablet 1 mg  1 mg Oral QID Rachael FeeIrving A Lugo, MD   1 mg at 06/12/14 1416  . feeding supplement (ENSURE COMPLETE) (ENSURE COMPLETE) liquid 237 mL  237 mL Oral BID BM Rachael FeeIrving A Lugo, MD   237 mL at 06/12/14 1400  . gabapentin (NEURONTIN) capsule 300 mg  300 mg Oral TID Rachael FeeIrving A Lugo, MD   300 mg at 06/12/14 1112  . hydrOXYzine (ATARAX/VISTARIL) tablet 25 mg  25 mg Oral Q6H PRN Worthy FlankIjeoma E Nwaeze, NP   25 mg at 06/12/14 0949  . magnesium hydroxide (MILK OF MAGNESIA) suspension 30 mL  30 mL Oral Daily PRN Rachael FeeIrving A Lugo, MD      . nicotine (NICODERM CQ - dosed in mg/24 hours) patch 21 mg  21 mg Transdermal Daily Kristeen MansFran E Hobson, NP   21 mg at 06/12/14 16100627  .  prazosin (MINIPRESS) capsule 2 mg  2 mg Oral QHS Rachael FeeIrving A Lugo, MD   2 mg at 06/11/14 1958  . testosterone cypionate (DEPOTESTOTERONE CYPIONATE) injection 100 mg  100 mg Intramuscular Q14 Days Rachael FeeIrving A Lugo, MD   100 mg at 06/09/14 1537  . thioridazine (MELLARIL) tablet 100 mg  100 mg Oral QHS Rachael FeeIrving A Lugo, MD   100 mg at 06/11/14 1957  . zolpidem (AMBIEN) tablet 10 mg  10 mg Oral QHS PRN Rachael FeeIrving A Lugo, MD   10 mg at 06/11/14 2137    Lab Results: No results found for this or any previous visit (from the past 48 hour(s)).  Physical Findings:  AIMS: Facial and Oral Movements Muscles of Facial Expression: None, normal Lips and Perioral Area: None, normal Jaw: None, normal Tongue: None, normal,Extremity Movements Upper (arms, wrists, hands, fingers): None, normal Lower (legs, knees, ankles, toes): None, normal, Trunk Movements Neck, shoulders, hips: None, normal, Overall Severity Severity of abnormal movements (highest score from questions above): None, normal Incapacitation due to abnormal movements: None, normal Patient's awareness of abnormal movements (rate only patient's report): No Awareness, Dental Status Current problems with teeth and/or dentures?: No Does patient usually wear dentures?: No  CIWA:  CIWA-Ar Total: 1 COWS:  COWS Total Score: 3   Assessment- patient is depressed, has chronic suicidal ideations, but is able to contract for safety on unit. He is tolerating medications well, major concern at this time is insomnia/ PTSD related nightmares .  Treatment Plan Summary: Daily contact with patient to assess and evaluate symptoms and progress in treatment, Medication management, Plan continue inpatient treatment  and medications as below  As discussed with staff, patient currently on waiting list for [facility]. Abilify 5 mgrs QDAY  Carbamazepine 200 mgrs BID Klonopin 1 mgr QID Neurontin 300 mgrs TID Ambien 10 mgrs QHS PRN Insomnia Would decrease Mellaril to 50 mgrs QHS, as  patient is concerned about side effects, and to simplify medication regimen.  Increase Minipress to 3 mgrs QHS - we have reviewed medication side effects. Suicidal ideas; continue to work to [employer]. He is still contracting for safety Medical Decision Making:  Established Problem, Stable/Improving (1), Review of Psycho-Social Stressors (1), Review of Medication Regimen & Side Effects (2) and Review of New Medication or Change in Dosage (2)     COBOS, FERNANDO 06/12/2014, 4:30 PM

## 2014-06-12 NOTE — Progress Notes (Signed)
Recreation Therapy Notes  Date: 03.18.2016 Time: 9:30am Location: [address] Group Room   Group Topic: Stress Management  Goal Area(s) Addresses:  Patient will actively participate in stress management techniques presented during session.   Behavioral Response: Engaged, Attentive.   Intervention: Stress Management   Activity :  Deep Breathing and Progressive Muscle Relaxation   Education:  Stress Management, Discharge Planning.   Education Outcome: Acknowledges education  Clinical Observations/Feedback: Due to being with RN patient arrived to group session during last 5 minutes. Due to missing group session LRT presented literature to patient following group and provided 1:1 instruction on completion of techniques covered. Patient reported being familiar with both and progressive muscle relaxation being helpful to him at home. LRT encouraged patient to continue use post d/c to maintained decreased stress level and encouraged patient to attend future stress management groups inpatient to continue to build tool box of stress management techniques. Patient agreeable.   Marykay Lexenise L Pesach Frisch, LRT/CTRS  Jearl KlinefelterBlanchfield, Mosetta Ferdinand L 06/12/2014 4:23 PM

## 2014-06-13 MED ORDER — ARIPIPRAZOLE 10 MG PO TABS
10.0000 mg | ORAL_TABLET | Freq: Every day | ORAL | Status: DC
Start: 2014-06-14 — End: 2014-06-13

## 2014-06-13 MED ORDER — PANTOPRAZOLE SODIUM 20 MG PO TBEC
20.0000 mg | DELAYED_RELEASE_TABLET | Freq: Every day | ORAL | Status: DC
  Administered 2014-06-13 – 2014-06-23 (×11): 20 mg via ORAL
  Filled 2014-06-13 (×15): qty 1

## 2014-06-13 MED ORDER — LORAZEPAM 2 MG/ML IJ SOLN
INTRAMUSCULAR | Status: AC
  Filled 2014-06-13: qty 1

## 2014-06-13 MED ORDER — LORAZEPAM 2 MG/ML IJ SOLN
2.0000 mg | Freq: Once | INTRAMUSCULAR | Status: AC
  Administered 2014-06-13: 2 mg via INTRAMUSCULAR

## 2014-06-13 MED ORDER — THIORIDAZINE HCL 100 MG PO TABS
100.0000 mg | ORAL_TABLET | Freq: Every day | ORAL | Status: DC
  Administered 2014-06-13 – 2014-06-15 (×3): 100 mg via ORAL
  Filled 2014-06-13: qty 1
  Filled 2014-06-13: qty 2
  Filled 2014-06-13 (×5): qty 1

## 2014-06-13 MED ORDER — ARIPIPRAZOLE 5 MG PO TABS
5.0000 mg | ORAL_TABLET | Freq: Every day | ORAL | Status: DC
  Filled 2014-06-13 (×2): qty 1

## 2014-06-13 NOTE — Progress Notes (Signed)
Psychoeducational Group Note  Date:  06/13/2014 Time: 1315  Group Topic/Focus:  Identifying Needs:   The focus of this group is to help patients identify their personal needs that have been historically problematic and identify healthy behaviors to address their needs.  Participation Level:  Active  Participation Quality:  Resistant  Affect:  Blunted  Cognitive:  Alert  Insight:  Limited  Engagement in Group:  Lacking  Additional Comments:    06/13/2014,4:53 PM Selenne Coggin, Joie BimlerPatricia Lynn

## 2014-06-13 NOTE — Progress Notes (Signed)
[name], male   DOB: [birth year], 44 y.o.   MRN: [id number] [name], male   DOB: 04-07-1970, 44 y.o.   MRN: [id number] [facility] MD Progress Note  06/13/2014 11:33 AM Phillip [name]  MRN:  [id number]   Subjective:  Patient states he continues to have PTSD symptoms, such as " seeing the faces of people who were killed",and struggling with nightmares that make sleep difficult.  Patient is concerned that med dosages are not enough.  In particular Abilify.  He states that the 10 mg does help him sleep better and helps his suicidal thought ideations. He is still endorsing suicidal thoughts- states these are chronic-  but is able to contract for safety on the unit.   Objective: I have seen the patient and discussed care with the treatment team. On unit, patient's behavior has been calm and in good control. He has continued to report ongoing suicidal ideations, but has been able to contract for safety on unit, and has not exhibited any self injurious behavior. He presents as calm, superficially cooperative, polite. He describes chronic depression, hopelessness, and PTSD symptoms as above. Denies medication side effects. His major concern at present is poor sleep, which he attributes mostly to ongoing vivid nightmares relating to combat experiences . Responds fairly well to support, encouragement, empathy.   Principal Problem: MDD (major depressive disorder), recurrent, severe, with psychosis Diagnosis:   Patient Active Problem List   Diagnosis Date Noted  . Major depression [F32.2] 06/06/2014  . Suicidal ideations [R45.851] 06/05/2014  . Panic disorder [F41.0] 05/19/2014  . MDD (major depressive disorder), recurrent, severe, with psychosis [F33.3] 05/18/2014  . PTSD (post-traumatic stress disorder) [F43.10] 01/29/2014  . Generalized anxiety disorder [F41.1] 06/19/2011    Class: Chronic   Total Time spent with patient: 20 minutes   Past Medical History:  Past Medical  History  Diagnosis Date  . Anxiety   . Depression   . PTSD (post-traumatic stress disorder)     Past Surgical History  Procedure Laterality Date  . Cholesterol     Family History:  Family History  Problem Relation Age of Onset  . Cancer Mother   . Cancer Father   . Depression Father    Social History:  History  Alcohol Use No     History  Drug Use No    Comment: denies    History   Social History  . Marital Status: Married    Spouse Name: N/A  . Number of Children: N/A  . Years of Education: N/A   Social History Main Topics  . Smoking status: Current Every Day Smoker -- 1.00 packs/day for 19 years    Types: Cigarettes  . Smokeless tobacco: Not on file  . Alcohol Use: No  . Drug Use: No     Comment: denies  . Sexual Activity: Yes    Birth Control/ Protection: None   Other Topics Concern  . None   Social History Narrative   Additional History:    Sleep:  Reported as poor due to nightmares, but chart indicates slept 6 hours   Appetite:  Fair   Assessment:   Musculoskeletal: Strength & Muscle Tone: within normal limits Gait & Station: normal Patient leans: N/A   Psychiatric Specialty Exam: Physical Exam  Vitals reviewed. Psychiatric: His mood appears anxious. He exhibits a depressed mood.    Review of Systems  Psychiatric/Behavioral: Positive for depression. The patient is nervous/anxious and has insomnia.   All other systems reviewed  and are negative.   Blood pressure 134/81, pulse 119, temperature 97.4 F (36.3 C), temperature source Oral, resp. rate 20, height 5' 10.5" (1.791 m), weight 82.555 kg (182 lb), SpO2 100 %.Body mass index is 25.74 kg/(m^2).  General Appearance: Fairly Groomed  Patent attorneyye Contact::  Good  Speech:  Clear and Coherent  Volume:  Normal  Mood:  Depressed  Affect:  Restricted, superficial   Thought Process:  Coherent and Goal Directed  Orientation:  Full (Time, Place, and Person)  Thought Content:  No hallucinations, no  delusions, not internally preoccupied  Suicidal Thoughts:  Yes.  without intent/plan- able to contract for safety on the unit   Homicidal Thoughts:  No  Memory:  Remote and recent grossly intact  Judgement:  Fair  Insight:  Present  Psychomotor Activity:  Normal  Concentration:  Good  Recall:  Good  Fund of Knowledge:Good  Language: Good  Akathisia:  No  Handed:  Right  AIMS (if indicated):     Assets:  Communication Skills Desire for Improvement Resilience  ADL's:  Intact  Cognition: WNL  Sleep:  Number of Hours: 5.5     Current Medications: Current Facility-Administered Medications  Medication Dose Route Frequency Provider Last Rate Last Dose  . acetaminophen (TYLENOL) tablet 650 mg  650 mg Oral Q4H PRN Kristeen MansFran E Hobson, NP      . alum & mag hydroxide-simeth (MAALOX/MYLANTA) 200-200-20 MG/5ML suspension 30 mL  30 mL Oral Q4H PRN Rachael FeeIrving A Lugo, MD   30 mL at 06/10/14 1651  . ARIPiprazole (ABILIFY) tablet 5 mg  5 mg Oral Daily Rachael FeeIrving A Lugo, MD   5 mg at 06/13/14 0851  . calcium carbonate (TUMS - dosed in mg elemental calcium) chewable tablet 400 mg of elemental calcium  400 mg of elemental calcium Oral TID WC Rachael FeeIrving A Lugo, MD   400 mg of elemental calcium at 06/13/14 1125  . carbamazepine (EQUETRO) 12 hr capsule 200 mg  200 mg Oral BID Rachael FeeIrving A Lugo, MD   200 mg at 06/13/14 0851  . clonazePAM (KLONOPIN) tablet 1 mg  1 mg Oral QID Rachael FeeIrving A Lugo, MD   1 mg at 06/13/14 1126  . feeding supplement (ENSURE COMPLETE) (ENSURE COMPLETE) liquid 237 mL  237 mL Oral BID BM Rachael FeeIrving A Lugo, MD   237 mL at 06/12/14 1400  . gabapentin (NEURONTIN) capsule 300 mg  300 mg Oral TID Rachael FeeIrving A Lugo, MD   300 mg at 06/13/14 1127  . hydrOXYzine (ATARAX/VISTARIL) tablet 25 mg  25 mg Oral Q6H PRN Worthy FlankIjeoma E Nwaeze, NP   25 mg at 06/13/14 0851  . magnesium hydroxide (MILK OF MAGNESIA) suspension 30 mL  30 mL Oral Daily PRN Rachael FeeIrving A Lugo, MD      . nicotine (NICODERM CQ - dosed in mg/24 hours) patch 21 mg  21 mg  Transdermal Daily Kristeen MansFran E Hobson, NP   21 mg at 06/13/14 [id number]  . prazosin (MINIPRESS) capsule 3 mg  3 mg Oral QHS Craige CottaFernando A Cobos, MD   3 mg at 06/12/14 2015  . testosterone cypionate (DEPOTESTOTERONE CYPIONATE) injection 100 mg  100 mg Intramuscular Q14 Days Rachael FeeIrving A Lugo, MD   100 mg at 06/09/14 1537  . thioridazine (MELLARIL) tablet 50 mg  50 mg Oral QHS Rockey SituFernando A Cobos, MD      . zolpidem (AMBIEN) tablet 10 mg  10 mg Oral QHS PRN Rachael FeeIrving A Lugo, MD   10 mg at 06/12/14 2114    Lab  Results: No results found for this or any previous visit (from the past 48 hour(s)).  Physical Findings: AIMS: Facial and Oral Movements Muscles of Facial Expression: None, normal Lips and Perioral Area: None, normal Jaw: None, normal Tongue: None, normal,Extremity Movements Upper (arms, wrists, hands, fingers): None, normal Lower (legs, knees, ankles, toes): None, normal, Trunk Movements Neck, shoulders, hips: None, normal, Overall Severity Severity of abnormal movements (highest score from questions above): None, normal Incapacitation due to abnormal movements: None, normal Patient's awareness of abnormal movements (rate only patient's report): No Awareness, Dental Status Current problems with teeth and/or dentures?: No Does patient usually wear dentures?: No  CIWA:  CIWA-Ar Total: 1 COWS:  COWS Total Score: 3   Assessment- patient is depressed, has chronic suicidal ideations, but is able to contract for safety on unit. He is tolerating medications well, major concern at this time is insomnia/ PTSD related nightmares .  Treatment Plan Summary: Daily contact with patient to assess and evaluate symptoms and progress in treatment, Medication management, Plan continue inpatient treatment  and medications as below  As discussed with staff, patient currently on waiting list for [facility]. EKG Today. Increased Abilify 10 mgrs QDAY  Carbamazepine 200 mgrs BID Klonopin 1 mgr QID Neurontin 300 mgrs TID Ambien 10  mgrs QHS PRN Insomnia Would decrease Mellaril to 50 mgrs QHS, as patient is concerned about side effects, and to simplify medication regimen.  Increase Minipress to 3 mgrs QHS - we have reviewed medication side effects. Suicidal ideas; continue to work to [employer]. He is still contracting for safety Medical Decision Making:  Established Problem, Stable/Improving (1), Review of Psycho-Social Stressors (1), Review of Medication Regimen & Side Effects (2) and Review of New Medication or Change in Dosage (2)   Adna Nofziger MAY, AGNP-BC 06/13/2014, 11:33 AM

## 2014-06-13 NOTE — [facility] Group Notes (Signed)
[facility] Group Notes:  (Clinical Social Work)  06/13/2014     10-11AM  Summary of Progress/Problems:   The main focus of today's process group was to learn how to use a decisional balance exercise to move forward in the Stages of Change, which were described and discussed.  Motivational Interviewing and a worksheet were utilized to help patients explore in depth the perceived benefits and costs of a self-sabotaging behavior, as well as the  benefits and costs of replacing that with a healthy coping mechanism.   The patient expressed himself freely and frequently throughout group, talked at length several times about the impact his isolation and withdrawal has had on his desire to die.  He expressed to the other group members how comfortable he feels with them, and that he has a lot in common with them.  Type of Therapy:  Group Therapy - Process   Participation Level:  Active  Participation Quality:  Attentive, Sharing and Supportive  Affect:  Blunted  Cognitive:  Appropriate and Oriented  Insight:  Engaged  Engagement in Therapy:  Engaged  Modes of Intervention:  Education, Motivational Interviewing  Ambrose MantleMareida Grossman-Orr, LCSW 06/13/2014, 12:46 PM

## 2014-06-13 NOTE — Progress Notes (Signed)
Please note:  Abilify dose is 5 mg daily.

## 2014-06-13 NOTE — Progress Notes (Signed)
Patient did not attend the evening speaker AA meeting. Pt was notified that group was beginning but did not attend. Pt also did not attend a wrap up group.

## 2014-06-13 NOTE — Progress Notes (Signed)
[name] has had a hard time today. This morning he was upset...disturbed that his physician had changed his mellaril and abilify orders ( he said the doctor did not write what he had told [name] he was going  To) . He has talked about his GERD, his lack of sleep, his anxiety, his PTSD and his overall discomfort all day long.    A He has taken his meds as planned. HE attends his groups, is engaged in the discussion, but he remains hopeless and flat and depressed. After he ate dinner tonight, he notified this nurse that he had thrown up his dinner, immediately after returning to his room. He was distraught, he began saying things like " just let me die.  I want to die. Im going to kill Dr. Jama Flavorsobos if I see him again....please just let me die". This nurse spoke to NP and pt was given ativan 2mg  IM and pt was placed on close obs to maintain safety.

## 2014-06-14 MED ORDER — ARIPIPRAZOLE 15 MG PO TABS
7.5000 mg | ORAL_TABLET | Freq: Every day | ORAL | Status: DC
  Administered 2014-06-14 – 2014-06-16 (×3): 7.5 mg via ORAL
  Filled 2014-06-14 (×7): qty 1

## 2014-06-14 NOTE — Progress Notes (Signed)
Patient ID: Trey SailorsSteven Supinski, male   DOB: 12/14/1970, 44 y.o.   MRN: [id number]   D:  Pt was irritable, approached the writer at 1930 asking for his sleep meds. When told that sleep are usually given at 2100 or later pt stated that his meds are to be given at 2000. Writer reminded pt that it wasn't yet 2000 and that she hadn't had a chance to review his meds. Pt stormed off and went into his room. Pt returned asking for his sleep meds at 2000. Informed the pt that he could get his minipress but other hs meds couldn't be given before 2100. Pt got upset stating that he should be getting them all at the same time. Writer informed pt that if he wanted he could get his minipress at 2100 with the rest of his meds.   A:  Support and encouragement was offered. 15 min checks continued for safety.  R: Pt remains safe.

## 2014-06-14 NOTE — Progress Notes (Signed)
[name], male   DOB: [birth year], 44 y.o.   MRN: [id number]   D:Pt laying in bed resting with eyes closed. Respirations even and unlabored. No distress noted. A: Monitor every 1:1  for safety. R: Pt remains safe.

## 2014-06-14 NOTE — Progress Notes (Signed)
Psychoeducational Group Note  Date: 06/14/2014 Time: 1315  Group Topic/Focus:  Making Healthy Choices:   The focus of this group is to help patients identify negative/unhealthy choices they were using prior to admission and identify positive/healthier coping strategies to replace them upon discharge.  Participation Level:  Did not attend.  Participation Quality:   Affect:    Cognitive:   Insight:    Engagement in Group:   Additional Comments:    06/14/2014,3:02 PM Reyce Lubeck, Joie BimlerPatricia Lynn

## 2014-06-14 NOTE — Progress Notes (Signed)
Psychoeducational Group Note  Date:  06/14/2014 Time: 1015 Group Topic/Focus:  Making Healthy Choices:   The focus of this group is to help patients identify negative/unhealthy choices they were using prior to admission and identify positive/healthier coping strategies to replace them upon discharge.  Participation Level:  Active  Participation Quality:  Appropriate  Affect:  Depressed  Cognitive:  Alert  Insight:  Engaged  Engagement in Group:  Engaged  Additional Comments:   Phillip [name], Phillip [name] 11:47 AM. 06/14/2014

## 2014-06-14 NOTE — Progress Notes (Signed)
[name], male   DOB: [birth year], 44 y.o.   MRN: [id number] [name], male   DOB: 1970-06-15, 44 y.o.   MRN: [id number] [name], male   DOB: 07-12-1970, 44 y.o.   MRN: [id number] [facility] MD Progress Note  06/14/2014 12:18 PM Phillip [name]  MRN:  [id number]   Subjective:  Patient states he continues to have PTSD symptoms, such as " seeing the faces of people who were killed",and struggling with nightmares that make sleep difficult.  Today he seems withdrawn, tearful and dejected.  Patient is concerned that med dosages are not enough.  In particular Abilify.  He states that the 10 mg does help him sleep better and helps his suicidal thought ideations.  He says he just doesn't care anymore.  "I'v made up my mind.  All my buddies came back andd they all killed themselves.   It's my turn.  I've made up my mind." He is still endorsing suicidal thoughts- states these are chronic-  but is able to contract for safety on the unit.   Objective: I have seen the patient and discussed care with the treatment team. On unit, patient's behavior has been calm and in good control. He has continued to report ongoing suicidal ideations, but has been able to contract for safety on unit, and has not exhibited any self injurious behavior. He presents as calm, superficially cooperative, polite. He describes chronic depression, hopelessness, and PTSD symptoms as above. Denies medication side effects. His major concern at present is poor sleep, which he attributes mostly to ongoing vivid nightmares relating to combat experiences . Responds fairly well to support, encouragement, empathy.   Principal Problem: MDD (major depressive disorder), recurrent, severe, with psychosis Diagnosis:   Patient Active Problem List   Diagnosis Date Noted  . Major depression [F32.2] 06/06/2014  . Suicidal ideations [R45.851] 06/05/2014  . Panic disorder [F41.0] 05/19/2014  . MDD (major  depressive disorder), recurrent, severe, with psychosis [F33.3] 05/18/2014  . PTSD (post-traumatic stress disorder) [F43.10] 01/29/2014  . Generalized anxiety disorder [F41.1] 06/19/2011    Class: Chronic   Total Time spent with patient: 20 minutes   Past Medical History:  Past Medical History  Diagnosis Date  . Anxiety   . Depression   . PTSD (post-traumatic stress disorder)     Past Surgical History  Procedure Laterality Date  . Cholesterol     Family History:  Family History  Problem Relation Age of Onset  . Cancer Mother   . Cancer Father   . Depression Father    Social History:  History  Alcohol Use No     History  Drug Use No    Comment: denies    History   Social History  . Marital Status: Married    Spouse Name: N/A  . Number of Children: N/A  . Years of Education: N/A   Social History Main Topics  . Smoking status: Current Every Day Smoker -- 1.00 packs/day for 19 years    Types: Cigarettes  . Smokeless tobacco: Not on file  . Alcohol Use: No  . Drug Use: No     Comment: denies  . Sexual Activity: Yes    Birth Control/ Protection: None   Other Topics Concern  . None   Social History Narrative   Additional History:    Sleep:  Reported as poor due to nightmares, but chart indicates slept 6 hours   Appetite:  Fair   Assessment:  Musculoskeletal: Strength & Muscle Tone: within normal limits Gait & Station: normal Patient leans: N/A   Psychiatric Specialty Exam: Physical Exam  Vitals reviewed. Psychiatric: His mood appears anxious. He exhibits a depressed mood.    ROS  Blood pressure 131/84, pulse 106, temperature 98.2 F (36.8 C), temperature source Oral, resp. rate 20, height 5' 10.5" (1.791 m), weight 82.555 kg (182 lb), SpO2 100 %.Body mass index is 25.74 kg/(m^2).  General Appearance: Fairly Groomed  Patent attorneyye Contact::  Good  Speech:  Clear and Coherent  Volume:  Normal  Mood:  Depressed  Affect:  Restricted, superficial    Thought Process:  Coherent and Goal Directed  Orientation:  Full (Time, Place, and Person)  Thought Content:  No hallucinations, no delusions, not internally preoccupied  Suicidal Thoughts:  Yes.  without intent/plan- able to contract for safety on the unit   Homicidal Thoughts:  No  Memory:  Remote and recent grossly intact  Judgement:  Fair  Insight:  Present  Psychomotor Activity:  Normal  Concentration:  Good  Recall:  Good  Fund of Knowledge:Good  Language: Good  Akathisia:  No  Handed:  Right  AIMS (if indicated):     Assets:  Communication Skills Desire for Improvement Resilience  ADL's:  Intact  Cognition: WNL  Sleep:  Number of Hours: 6     Current Medications: Current Facility-Administered Medications  Medication Dose Route Frequency Provider Last Rate Last Dose  . acetaminophen (TYLENOL) tablet 650 mg  650 mg Oral Q4H PRN Kristeen MansFran E Hobson, NP      . alum & mag hydroxide-simeth (MAALOX/MYLANTA) 200-200-20 MG/5ML suspension 30 mL  30 mL Oral Q4H PRN Rachael FeeIrving A Lugo, MD   30 mL at 06/10/14 1651  . ARIPiprazole (ABILIFY) tablet 5 mg  5 mg Oral QHS Adonis BrookSheila Kelen Laura, NP      . calcium carbonate (TUMS - dosed in mg elemental calcium) chewable tablet 400 mg of elemental calcium  400 mg of elemental calcium Oral TID WC Rachael FeeIrving A Lugo, MD   400 mg of elemental calcium at 06/14/14 0645  . carbamazepine (EQUETRO) 12 hr capsule 200 mg  200 mg Oral BID Rachael FeeIrving A Lugo, MD   200 mg at 06/14/14 0844  . clonazePAM (KLONOPIN) tablet 1 mg  1 mg Oral QID Rachael FeeIrving A Lugo, MD   1 mg at 06/14/14 1108  . feeding supplement (ENSURE COMPLETE) (ENSURE COMPLETE) liquid 237 mL  237 mL Oral BID BM Rachael FeeIrving A Lugo, MD   237 mL at 06/13/14 1400  . gabapentin (NEURONTIN) capsule 300 mg  300 mg Oral TID Rachael FeeIrving A Lugo, MD   300 mg at 06/14/14 1108  . hydrOXYzine (ATARAX/VISTARIL) tablet 25 mg  25 mg Oral Q6H PRN Worthy FlankIjeoma E Nwaeze, NP   25 mg at 06/13/14 0851  . magnesium hydroxide (MILK OF MAGNESIA) suspension 30 mL   30 mL Oral Daily PRN Rachael FeeIrving A Lugo, MD      . nicotine (NICODERM CQ - dosed in mg/24 hours) patch 21 mg  21 mg Transdermal Daily Kristeen MansFran E Hobson, NP   21 mg at 06/14/14 0845  . pantoprazole (PROTONIX) EC tablet 20 mg  20 mg Oral Daily Adonis BrookSheila Sephora Boyar, NP   20 mg at 06/14/14 0843  . prazosin (MINIPRESS) capsule 3 mg  3 mg Oral QHS Craige CottaFernando A Cobos, MD   3 mg at 06/13/14 2130  . testosterone cypionate (DEPOTESTOTERONE CYPIONATE) injection 100 mg  100 mg Intramuscular Q14 Days Rachael FeeIrving A Lugo, MD  100 mg at 06/09/14 1537  . thioridazine (MELLARIL) tablet 100 mg  100 mg Oral QHS Adonis Brook, NP   100 mg at 06/13/14 2130  . zolpidem (AMBIEN) tablet 10 mg  10 mg Oral QHS PRN Rachael Fee, MD   10 mg at 06/13/14 2130    Lab Results: No results found for this or any previous visit (from the past 48 hour(s)).  Physical Findings: AIMS: Facial and Oral Movements Muscles of Facial Expression: None, normal Lips and Perioral Area: None, normal Jaw: None, normal Tongue: None, normal,Extremity Movements Upper (arms, wrists, hands, fingers): None, normal Lower (legs, knees, ankles, toes): None, normal, Trunk Movements Neck, shoulders, hips: None, normal, Overall Severity Severity of abnormal movements (highest score from questions above): None, normal Incapacitation due to abnormal movements: None, normal Patient's awareness of abnormal movements (rate only patient's report): No Awareness, Dental Status Current problems with teeth and/or dentures?: No Does patient usually wear dentures?: No  CIWA:  CIWA-Ar Total: 1 COWS:  COWS Total Score: 3   Assessment- patient is depressed, has chronic suicidal ideations, but is able to contract for safety on unit. He is tolerating medications well, major concern at this time is insomnia/ PTSD related nightmares .  Treatment Plan Summary: Daily contact with patient to assess and evaluate symptoms and progress in treatment, Medication management, Plan continue  inpatient treatment  and medications as below    As discussed with staff, patient currently on waiting list for [facility]. Increased Abilify 7.5 mgrs QDAY  Carbamazepine 200 mgrs BID Klonopin 1 mgr QID Neurontin 300 mgrs TID Ambien 10 mgrs QHS PRN Insomnia Would decrease Mellaril to 50 mgrs QHS, as patient is concerned about side effects, and to simplify medication regimen.  Increase Minipress to 3 mgrs QHS - we have reviewed medication side effects. Suicidal ideas; continue to work to [employer]. He is still contracting for safety.  Medical Decision Making:  Established Problem, Stable/Improving (1), Review of Psycho-Social Stressors (1), Review of Medication Regimen & Side Effects (2) and Review of New Medication or Change in Dosage (2)   Phillip [name], AGNP-BC 06/14/2014, 12:18 PM

## 2014-06-14 NOTE — Progress Notes (Signed)
Patient did not attend the evening speaker AA meeting not did he attend a wrap up group. Pt was notified that groups were beginning but remained in his room with close observation.

## 2014-06-14 NOTE — Progress Notes (Addendum)
LATE ENTRY FOR 1000 CO   [name] is seen OOB UAL on the 300 hall this morning...tolerated fair. He avoids eye contact with this nurse. HE has been seen lying in his bed, walking around the halls and the dayroom, and  Sitting in the dayroom watching TV and talking with other patients.   A HE thanks this nurse profusely for  Staying with me and helping me so much" last night. When he is asked if he is ready to come off the Close Obs level of care, he shakes his head adamantly " no" and this nurse assured him he would stay on the CO. He said " I don't feel safe". He is gamey and manipulative, yet there is a genuine sadness...an aloneness that pervades around him.   R He completes his daily assessment and on it he writes he cont to have thoughts of hurting himslef and again he says " I don't want to see Dr. Jama Flavorsobos .ever". This is shared with other practitioners, poc in place and safety is maintained.

## 2014-06-14 NOTE — Progress Notes (Signed)
Phillip [name] remains quiet, guarded and labile. He cont to be on his CO, due to HI directed at physicians and others as well as intermittent statements that he still " wants to just end it all....just let me go", he says.   A He remains VERY focused on his medications and demonstrates minimal ability or willingness to wait for them, if nurse is caring for another patient besides himself.    R CO in place. Patient behavior monitored. Safety in place.

## 2014-06-14 NOTE — Progress Notes (Signed)
Close obs note: D. Pt in dayroom at this time, seen interacting with peers in milieu. Prior to this, pt did receive hs medications without incident and did endorse the reason that he was on close obs was that he was feeling suicidal, still having some passive SI thoughts but able to contract for safety on the unit and reports that it's nothing we have to worry about this evening. Pt also chose not to participate in evening group activity. A. Support and encouragement provided, medication education given. R. Pt verbalized understanding, safety maintained, will continue to monitor.

## 2014-06-14 NOTE — Progress Notes (Cosign Needed)
Patient seen today, he was extremely upset and endorsed suicidal ideation.  He was very tearful.  "I'm tired of feeling this way.  I just want it to end.  Ive seen a lot of things.  Ive seen people die."  He states that his meds were adjusted and he had been on it for a long time.  Placed on a close obs status with nursing.

## 2014-06-14 NOTE — Progress Notes (Signed)
Phillip [name] is quiet. He avoids eye contact and he is shy when this nurse approaches him. HE , again, ia profusely thanking this nurse for helping him both last night and tonight. He says " I'm just going to let you all be resPonsible for me for awhile ...because I can't right now".   A  He cont to take his medications as planned. He met with NP earlier today and became quite emotional and began sobbing. This lasted several minutes and it took several more minutes to deescalate him.  He was given 25 mg of hydroxizine at 1514 and it was noted to be effective in calming him down and at 1 hr later, he said " I I'm better".    R CO is continued and poc cont.safety in place.

## 2014-06-14 NOTE — [facility] Group Notes (Signed)
[facility] Group Notes:  (Clinical Social Work)  06/14/2014   10:00am-11:00am  Summary of Progress/Problems:  The main focus of today's process group was to discuss adding healthy supports to enhance recovery.  The need for support and types that are available was explored in the first half of group.  For the second half, patients listened to various genres of music and identified their emotional responses.  Handouts were used to record feelings evoked, as well as how patient can personally use this knowledge in sleep habits, with depression, and with other symptoms.  The patient expressed understanding of concepts, as well as knowledge of how each type of music affected him/her and how this can be used at home as a wellness/recovery tool.  One song which another patient had requested triggered pt to state, "that makes me want to kill everybody and myself" and was thus turned off quickly.  His affect was flatter after the music than at the beginning of group.  Type of Therapy:  Music Therapy   Participation Level:  Active  Participation Quality:  Attentive and Sharing  Affect:  Flat  Cognitive:  Oriented  Insight:  Limited  Engagement in Therapy:  Limited  Modes of Intervention:   Activity, Exploration  Ambrose MantleMareida Grossman-Orr, LCSW 06/14/2014, 12:30pm

## 2014-06-15 LAB — CBC WITH DIFFERENTIAL/PLATELET
Basophils Absolute: 0 10*3/uL (ref 0.0–0.1)
Basophils Relative: 0 % (ref 0–1)
Eosinophils Absolute: 0.3 10*3/uL (ref 0.0–0.7)
Eosinophils Relative: 4 % (ref 0–5)
HCT: 42.1 % (ref 39.0–52.0)
Hemoglobin: 13.8 g/dL (ref 13.0–17.0)
Lymphocytes Relative: 38 % (ref 12–46)
Lymphs Abs: 2.4 10*3/uL (ref 0.7–4.0)
MCH: 31.5 pg (ref 26.0–34.0)
MCHC: 32.8 g/dL (ref 30.0–36.0)
MCV: 96.1 fL (ref 78.0–100.0)
Monocytes Absolute: 0.8 10*3/uL (ref 0.1–1.0)
Monocytes Relative: 12 % (ref 3–12)
Neutro Abs: 3 10*3/uL (ref 1.7–7.7)
Neutrophils Relative %: 46 % (ref 43–77)
Platelets: 221 10*3/uL (ref 150–400)
RBC: 4.38 MIL/uL (ref 4.22–5.81)
RDW: 12.9 % (ref 11.5–15.5)
WBC: 6.5 10*3/uL (ref 4.0–10.5)

## 2014-06-15 LAB — COMPREHENSIVE METABOLIC PANEL
ALT: 69 U/L — ABNORMAL HIGH (ref 0–53)
AST: 31 U/L (ref 0–37)
Albumin: 4.2 g/dL (ref 3.5–5.2)
Alkaline Phosphatase: 76 U/L (ref 39–117)
Anion gap: 8 (ref 5–15)
BUN: 17 mg/dL (ref 6–23)
CO2: 27 mmol/L (ref 19–32)
Calcium: 9.3 mg/dL (ref 8.4–10.5)
Chloride: 104 mmol/L (ref 96–112)
Creatinine, Ser: 1.01 mg/dL (ref 0.50–1.35)
GFR calc Af Amer: >90 mL/min (ref >90)
GFR calc non Af Amer: >90 mL/min (ref >90)
Glucose, Bld: 96 mg/dL (ref 70–99)
Potassium: 4.1 mmol/L (ref 3.5–5.1)
Sodium: 139 mmol/L (ref 135–145)
Total Bilirubin: 0.4 mg/dL (ref 0.3–1.2)
Total Protein: 7.2 g/dL (ref 6.0–8.3)

## 2014-06-15 LAB — GLUCOSE, CAPILLARY: Glucose-Capillary: 230 mg/dL — ABNORMAL HIGH (ref 70–99)

## 2014-06-15 LAB — LIPASE, BLOOD: Lipase: 37 U/L (ref 11–59)

## 2014-06-15 LAB — URINALYSIS, ROUTINE W REFLEX MICROSCOPIC
Bilirubin Urine: NEGATIVE
Glucose, UA: 250 mg/dL — AB
Hgb urine dipstick: NEGATIVE
Ketones, ur: NEGATIVE mg/dL
Leukocytes, UA: NEGATIVE
Nitrite: NEGATIVE
Protein, ur: NEGATIVE mg/dL
Specific Gravity, Urine: 1.014 (ref 1.005–1.030)
Urobilinogen, UA: 0.2 mg/dL (ref 0.0–1.0)
pH: 6 (ref 5.0–8.0)

## 2014-06-15 NOTE — Progress Notes (Signed)
Close Observation Note  D: Patient standing at the nurses station, requesting to speak with the doctor; no s/s of distress noted;  even labored breathing.  A: Maintain Q15 minute checks for safety.  R: Patient remains safe on the unit.

## 2014-06-15 NOTE — ED Notes (Signed)
[facility], [facility], patient is being ugly to staff and security in triage, report called to Avery DennisonBrooke RN

## 2014-06-15 NOTE — Progress Notes (Signed)
cloes obs note: D. Pt in bed currently resting quietly with eyes closed, respirations even and unlabored and does not appear to be in any acute distress at this time. A. Safety maintained. R. Will continue to monitor. 

## 2014-06-15 NOTE — ED Notes (Signed)
[facility] Expected date:  Expected time:  Means of arrival:  Comments: [facility] pt evalulate foot problem

## 2014-06-15 NOTE — Progress Notes (Signed)
cloes obs note: D. Pt in bed currently resting quietly with eyes closed, respirations even and unlabored and does not appear to be in any acute distress at this time. A. Safety maintained. R. Will continue to monitor.

## 2014-06-15 NOTE — Progress Notes (Signed)
Close Observation Note  D: Patient is sitting in the dayroom conversing with peers.  A: Continue close observation of patient and monitor Q15 minute checks for safety.  R: Patient receptive and remains safe on the unit.

## 2014-06-15 NOTE — Progress Notes (Signed)
Recreation Therapy Notes  Date: 03.21.2016 Time: 9:30am Location: 300 Hall Group Room   Group Topic: Stress Management  Goal Area(s) Addresses:  Patient will actively participate in stress management techniques presented during session.   Behavioral Response: Did not attend.   Marykay Lexenise L Dao Memmott, LRT/CTRS  Brennan Litzinger L 06/15/2014 10:02 AM

## 2014-06-15 NOTE — Progress Notes (Signed)
[name], male   DOB: 01/07/1971, 44 y.o.   MRN: [id number] [name], male   DOB: 1970/07/26, 44 y.o.   MRN: [id number] [name], male   DOB: [birth year], 44 y.o.   MRN: [id number] [facility] MD Progress Note  06/15/2014 2:10 PM Phillip [name]  MRN:  [id number]   Subjective:  Patient states he continues to have PTSD symptoms, such as " seeing the faces of people who were killed",and struggling with nightmares that make sleep difficult.  Today he seems withdrawn, tearful and dejected.  Patient is concerned that med dosages are not enough.  In particular Abilify.  He states that the 10 mg does help him sleep better and helps his suicidal thought ideations.  He says he just doesn't care anymore.  "I'v made up my mind.  All my buddies came back andd they all killed themselves.   It's my turn.  I've made up my mind." He is still endorsing suicidal thoughts- states these are chronic-  but is able to contract for safety on the unit.   Objective: I have seen the patient and discussed care with the treatment team. On unit, patient's behavior has been calm and in good control. He has continued to report ongoing suicidal ideations, but has been able to contract for safety on unit, and has not exhibited any self injurious behavior. He presents as calm, superficially cooperative, polite. He describes chronic depression, hopelessness, and PTSD symptoms as above. Denies medication side effects. His major concern at present is poor sleep, which he attributes mostly to ongoing vivid nightmares relating to combat experiences . Responds fairly well to support, encouragement, empathy.   Principal Problem: MDD (major depressive disorder), recurrent, severe, with psychosis Diagnosis:   Patient Active Problem List   Diagnosis Date Noted  . Major depression [F32.2] 06/06/2014  . Suicidal ideations [R45.851] 06/05/2014  . Panic disorder [F41.0] 05/19/2014  . MDD (major depressive  disorder), recurrent, severe, with psychosis [F33.3] 05/18/2014  . PTSD (post-traumatic stress disorder) [F43.10] 01/29/2014  . Generalized anxiety disorder [F41.1] 06/19/2011    Class: Chronic   Total Time spent with patient: 30 minutes   Past Medical History:  Past Medical History  Diagnosis Date  . Anxiety   . Depression   . PTSD (post-traumatic stress disorder)     Past Surgical History  Procedure Laterality Date  . Cholesterol     Family History:  Family History  Problem Relation Age of Onset  . Cancer Mother   . Cancer Father   . Depression Father    Social History:  History  Alcohol Use No     History  Drug Use No    Comment: denies    History   Social History  . Marital Status: Married    Spouse Name: N/A  . Number of Children: N/A  . Years of Education: N/A   Social History Main Topics  . Smoking status: Current Every Day Smoker -- 1.00 packs/day for 19 years    Types: Cigarettes  . Smokeless tobacco: Not on file  . Alcohol Use: No  . Drug Use: No     Comment: denies  . Sexual Activity: Yes    Birth Control/ Protection: None   Other Topics Concern  . None   Social History Narrative   Additional History:    Sleep:  Reported as poor due to nightmares, but chart indicates slept 6 hours   Appetite:  Fair   Assessment:  Musculoskeletal: Strength & Muscle Tone: within normal limits Gait & Station: normal Patient leans: N/A   Psychiatric Specialty Exam: Physical Exam  Vitals reviewed. Psychiatric: His mood appears anxious. He exhibits a depressed mood.    ROS  Blood pressure 133/84, pulse 106, temperature 97.4 F (36.3 C), temperature source Oral, resp. rate 16, height 5' 10.5" (1.791 m), weight 82.555 kg (182 lb), SpO2 100 %.Body mass index is 25.74 kg/(m^2).  General Appearance: Fairly Groomed  Patent attorney::  Good  Speech:  Clear and Coherent  Volume:  Normal  Mood:  Depressed  Affect:  Restricted, superficial   Thought  Process:  Coherent and Goal Directed  Orientation:  Full (Time, Place, and Person)  Thought Content:  No hallucinations, no delusions, not internally preoccupied  Suicidal Thoughts:  Yes.  with intent/plan (shooting self with gun)- able to contract for safety on the unit, since he does not have any means to hurt himself here   Homicidal Thoughts:  No  Memory:  Remote and recent grossly intact  Judgement:  Fair  Insight:  Present  Psychomotor Activity:  Normal  Concentration:  Good  Recall:  Good  Fund of Knowledge:Good  Language: Good  Akathisia:  No  Handed:  Right  AIMS (if indicated):     Assets:  Communication Skills Desire for Improvement Resilience  ADL's:  Intact  Cognition: WNL  Sleep:  Number of Hours: 6     Current Medications: Current Facility-Administered Medications  Medication Dose Route Frequency Provider Last Rate Last Dose  . acetaminophen (TYLENOL) tablet 650 mg  650 mg Oral Q4H PRN Kristeen Mans, NP      . alum & mag hydroxide-simeth (MAALOX/MYLANTA) 200-200-20 MG/5ML suspension 30 mL  30 mL Oral Q4H PRN Rachael Fee, MD   30 mL at 06/10/14 1651  . ARIPiprazole (ABILIFY) tablet 7.5 mg  7.5 mg Oral QHS Adonis Brook, NP   7.5 mg at 06/14/14 2050  . calcium carbonate (TUMS - dosed in mg elemental calcium) chewable tablet 400 mg of elemental calcium  400 mg of elemental calcium Oral TID WC Rachael Fee, MD   400 mg of elemental calcium at 06/15/14 0840  . carbamazepine (EQUETRO) 12 hr capsule 200 mg  200 mg Oral BID Rachael Fee, MD   200 mg at 06/15/14 [phone]  . clonazePAM (KLONOPIN) tablet 1 mg  1 mg Oral QID Rachael Fee, MD   1 mg at 06/15/14 1347  . feeding supplement (ENSURE COMPLETE) (ENSURE COMPLETE) liquid 237 mL  237 mL Oral BID BM Rachael Fee, MD   237 mL at 06/15/14 1047  . gabapentin (NEURONTIN) capsule 300 mg  300 mg Oral TID Rachael Fee, MD   300 mg at 06/15/14 1207  . hydrOXYzine (ATARAX/VISTARIL) tablet 25 mg  25 mg Oral Q6H PRN Worthy Flank, NP   25 mg at 06/14/14 2304  . magnesium hydroxide (MILK OF MAGNESIA) suspension 30 mL  30 mL Oral Daily PRN Rachael Fee, MD      . nicotine (NICODERM CQ - dosed in mg/24 hours) patch 21 mg  21 mg Transdermal Daily Kristeen Mans, NP   21 mg at 06/15/14 0842  . pantoprazole (PROTONIX) EC tablet 20 mg  20 mg Oral Daily Adonis Brook, NP   20 mg at 06/15/14 0841  . prazosin (MINIPRESS) capsule 3 mg  3 mg Oral QHS Craige Cotta, MD   3 mg at 06/14/14 2049  .  testosterone cypionate (DEPOTESTOTERONE CYPIONATE) injection 100 mg  100 mg Intramuscular Q14 Days Rachael FeeIrving A Lugo, MD   100 mg at 06/09/14 1537  . thioridazine (MELLARIL) tablet 100 mg  100 mg Oral QHS Adonis BrookSheila Agustin, NP   100 mg at 06/14/14 2049  . zolpidem (AMBIEN) tablet 10 mg  10 mg Oral QHS PRN Rachael FeeIrving A Lugo, MD   10 mg at 06/14/14 2049    Lab Results: No results found for this or any previous visit (from the past 48 hour(s)).  Physical Findings: AIMS: Facial and Oral Movements Muscles of Facial Expression: None, normal Lips and Perioral Area: None, normal Jaw: None, normal Tongue: None, normal,Extremity Movements Upper (arms, wrists, hands, fingers): None, normal Lower (legs, knees, ankles, toes): None, normal, Trunk Movements Neck, shoulders, hips: None, normal, Overall Severity Severity of abnormal movements (highest score from questions above): None, normal Incapacitation due to abnormal movements: None, normal Patient's awareness of abnormal movements (rate only patient's report): No Awareness, Dental Status Current problems with teeth and/or dentures?: No Does patient usually wear dentures?: No  CIWA:  CIWA-Ar Total: 1 COWS:  COWS Total Score: 3   Assessment- patient is depressed, has chronic suicidal ideations, but is able to contract for safety on unit. He is tolerating medications well, major concern at this time is insomnia/ PTSD related nightmares. Pt is angry at Dr. Jama Flavorsobos for cutting his ambien dose in half,  and states he would hurt Dr. Jama Flavorsobos if he saw him again. Pt started having bilateral lower extremity edema last night, which pt reports is worsening and becoming painful.   Treatment Plan Summary: Daily contact with patient to assess and evaluate symptoms and progress in treatment, Medication management, Plan continue inpatient treatment  and medications as below    As discussed with staff, patient currently on waiting list for [facility]. Continue Abilify 7.5 mgrs QDAY  Carbamazepine 200 mgrs BID Klonopin 1 mgr QID Neurontin 300 mgrs TID Ambien 10 mgrs QHS PRN Insomnia Continue Mellaril 50 mgrs QHS Continue Minipress 3 mgrs QHS Suicidal ideas; continue to work to [employer] hope. He is still contracting for safety. Will ask for hospitalist consult, due to worsening painful edema.  Medical Decision Making:  Established Problem, Stable/Improving (1), Review of Psycho-Social Stressors (1), Review of Medication Regimen & Side Effects (2) and Review of New Medication or Change in Dosage (2)   Ancil LinseySARANGA, Sabirin Baray MD  06/15/2014, 2:10 PM

## 2014-06-15 NOTE — Tx Team (Signed)
Interdisciplinary Treatment Plan Update (Adult)   Date: 06/15/2014   Time Reviewed: 9:30AM Progress in Treatment:  Attending groups: Yes  Participating in groups:  Yes  Taking medication as prescribed: Yes  Tolerating medication: Yes  Family/Significant othe contact made: No. Pt refusing family contact for SPE or collateral information. SPE completed with pt.  Patient understands diagnosis: No-pt reports that he does not want resources "wasted" on him. Pt has clear plan to d/c, drive out of state and kill himself. [facility]  Discussing patient identified problems/goals with staff: Yes  Medical problems stabilized or resolved: Yes  Denies suicidal/homicidal ideation: SI reported/able to contract for safety on unit. Passive HI directed at Dr. Cobos/Dr. Elna BreslowEappen. Pt is on close observation due to threats.  Patient has not harmed self or Others: Yes  New problem(s) identified:  Discharge Plan or Barriers: Pt is aware that he is on [facility] wait-list. He still refuses to participate in aftercare plan. "I will shoot myself if I'm discharged. I'm not safe by myself."  Additional comments: 44 Y/o male D/C from our unit a week ago. States that when he left he went back to his GF. States that things did not work out. They broke up. States he also lost his job. States he has nothing left. States that killing himself is "the right thing to do." states he has pushed everyone away has no family left. He reports med noncompliance since [facility]. Pt reports that he is "not depressed" but rather, "at peace and relieved" because he has reportedly made up his mind to end his life. Pt states that he owns a gun and plans to shoot himself; he even admitted to the counselor that he is considering "just lying to the psychiatrists" in order to be [facility] as [facility] as possible. He says that he has nothing else to live for and that he has done everything that he wanted to do in his  life. The pt appeared to be at peace with his decision and admitted that the only fear he has about his decision to end his own life would be "going to hell"; however, the pt quickly adds, "I'm ready for it though". Though he denies any current depression, the pt reports increased depressive sx in the past 5 months, including multiple crying spells throughout the day, insomnia, social isolation and "pushing everyone away", deceased appetite, fatigue, lack of motivation, decreased concentration, inappropriate guilt, feelings of hopelessness, and severe suicidal thoughts with plan and intent. Some of the triggers for the pt's increase in sx include recently losing his job and his fiancee.Pt reports daily panic attacks and says that he only sleeps 1-2 hours per night out of fear of nightmares in relation to the trauma he experienced while in combat [place] [facility]. He reports sleeping 1-2 hours per night on average. He states that he only eats about once per day and has lost a substantial amount of weight in recent months. In addition, the pt reports anxiety when in crowds and around other people. Pt reports that no mental health professionals can help him and that he has been in psychiatric hospitals 5 times in the past 5 months. Pt talks about how he has read "every book" on his diagnoses and the various treatments available. He reports attempting suicide at least twice in the past via OD.  3/21: Pt has been attending and participating in groups. Pleasant and calm. Pt continues to endorse  high depression/high anxiety and passive SI/HI-toward doctors-pt is on close obervation currently. Pt has contracted for safety on unit. He is aware of [facility] referral and reports that "this is probably best for me. I know I'll kill myself if I'm discharged." Pt reports that he is sleeping through the night "but the nightmares are back."  Reason for Continuation of Hospitalization: SI Depression/mood instability Medication  stabilization Estimated length of stay: unknown-pt reports SI/HI-toward Dr. Kathleen Lime. Pt on close observation. [facility] waitlist. Verified with Junious Dresser in admission this morning that pt is still on their list.  For review of initial/current patient goals, please see plan of care.  Attendees:  Patient:    Family:    Physician: Dr. Vale Haven MD  06/15/2014   Nursing: Griffin Dakin RN; Pawnee T RN  06/15/2014   Clinical Social Worker Christeena Krogh Smart, LCSWA  06/15/2014   Other: Arva Chafe; Lynann Bologna 06/15/2014   Other: Darden Dates Nurse CM 06/15/2014   Other: Liliane Bade, Community Care Coordinator  06/15/2014   Other:    Scribe for Treatment Team:  Herbert Seta Smart LCSWA 06/15/2014 9:30AM

## 2014-06-15 NOTE — ED Notes (Signed)
Awake. Verbally responsive. A/O x4. Resp even and unlabored. No audible adventitious breath sounds noted. ABC's intact. Sitter at bedside.

## 2014-06-15 NOTE — Progress Notes (Signed)
Close Observation Note  Writer unable to assess how the patient is doing at this time. [facility] edema in the lower extremities.

## 2014-06-15 NOTE — Progress Notes (Signed)
Close obs note: D. Pt returned from ED at this time. Pt, upon arrival, went to room and spoke very briefly about his experience in ED. Pt was provided a sandwich and pt informed Clinical research associatewriter that he would like medications after he gets finished with his meal. Pt does not report any problem at this time and able to contract for safety. A. Pt provided food and fluids upon arrival. R. Safety maintained, will continue to monitor.

## 2014-06-15 NOTE — [facility] Group Notes (Signed)
[facility] Note   06/15/2014 9:57 AM  Participation Quality:  Appropriate   Mood/Affect:  Flat  Depression Rating:  10  Anxiety Rating:  10  Thoughts of Suicide:  Yes Will you contract for safety?   Yes  Current [facility]:  No  Plan for Discharge/Comments:  Pt pleasant and calm during group. Continues to report high anxiety and depression "nothing really changes." Pt reports sleeping through night "but the nightmares are back." [facility]   Transportation Means: sheriff?   Supports: none identified "I pushed everyone away"   Smart, [employer] FinancialHeather LCSWA

## 2014-06-15 NOTE — ED Provider Notes (Signed)
CSN: [id number]     Arrival date & time 06/15/14  1713 History   First MD Initiated Contact with Patient 06/15/14 1813     Chief Complaint  Patient presents with  . Foot Pain   HPI   44 year old male presents with left and right lower extremity swelling and pain. Patient reports that yesterday he noticed a "burning sensation" in his left and right lower extremities. He denies a history of gout. Patient reports spraining his left ankle approximately 3 weeks ago. Denied swelling at that time. Patient also notes that he has darker urines than normal, but denies dysuria, discharge, blood. Patient also notes periumbilical abdominal pain. He notes normal bowel movements without change in consistency or color. Denies nausea vomiting, chest pain, shortness of breath, headaches, dizziness. He reports recently starting a new medication "carbamazepine"  Past Medical History  Diagnosis Date  . Anxiety   . Depression   . PTSD (post-traumatic stress disorder)    Past Surgical History  Procedure Laterality Date  . Cholesterol     Family History  Problem Relation Age of Onset  . Cancer Mother   . Cancer Father   . Depression Father    History  Substance Use Topics  . Smoking status: Current Every Day Smoker -- 1.00 packs/day for 19 years    Types: Cigarettes  . Smokeless tobacco: Not on file  . Alcohol Use: No    Review of Systems  All other systems reviewed and are negative.  Allergies  Review of patient's allergies indicates no known allergies.  Home Medications   Prior to Admission medications   Medication Sig Start Date End Date Taking? Authorizing Provider  benztropine (COGENTIN) 0.5 MG tablet Take 1 tablet (0.5 mg total) by mouth 3 (three) times daily at 8am, 2pm and bedtime. For drug induced tremors 05/27/14   Shuvon B Rankin, NP  clonazePAM (KLONOPIN) 0.5 MG tablet Take 1 tablet (0.5 mg total) by mouth 3 (three) times daily before meals. For anxiety 05/27/14   Shuvon B Rankin, NP   docusate sodium (COLACE) 100 MG capsule Take 1 capsule (100 mg total) by mouth 2 (two) times daily. For constipation 05/27/14   Shuvon B Rankin, NP  famotidine (PEPCID) 20 MG tablet Take 1 tablet (20 mg total) by mouth 2 (two) times daily. For reflux 05/27/14   Shuvon B Rankin, NP  fluvoxaMINE (LUVOX) 50 MG tablet Take 1 tablet (50 mg) every morning with breakfast and 3 tablets every evening with supper for depression Patient taking differently: Take 50-150 mg by mouth 2 (two) times daily. Take 1 tablet (50 mg) every morning with breakfast and 3 tablets every evening with supper for depression 05/27/14   Shuvon B Rankin, NP  gabapentin (NEURONTIN) 300 MG capsule Take 2 capsules (600 mg total) by mouth 3 (three) times daily. For alcohol withdrawal syndrome 05/27/14   Shuvon B Rankin, NP  haloperidol (HALDOL) 5 MG tablet Take 1 tablet (5 mg total) by mouth 3 (three) times daily at 8am, 2pm and bedtime. For psychosis 05/27/14   Shuvon B Rankin, NP  hydrOXYzine (ATARAX/VISTARIL) 25 MG tablet Take 1 tablet (25 mg total) by mouth every 6 (six) hours as needed for anxiety. 05/27/14   Shuvon B Rankin, NP  nicotine (NICODERM CQ - DOSED IN MG/24 HOURS) 21 mg/24hr patch Place 1 patch (21 mg total) onto the skin daily. 02/02/14   Adonis Brook, NP  prazosin (MINIPRESS) 1 MG capsule Take 1 capsule (1 mg total) by mouth at bedtime.  For nightmares 05/27/14   Shuvon B Rankin, NP  traZODone (DESYREL) 150 MG tablet Take 1 tablet (150 mg total) by mouth at bedtime as needed for sleep. 05/27/14   Shuvon B Rankin, NP   BP 137/96 mmHg  Pulse 100  Temp(Src) 98.6 F (37 C) (Oral)  Resp 14  Ht 5' 10.5" (1.791 m)  Wt 182 lb (82.555 kg)  BMI 25.74 kg/m2  SpO2 97% Physical Exam  Constitutional: He is oriented to person, place, and time. He appears well-developed and well-nourished.  HENT:  Head: Normocephalic and atraumatic.  Eyes: Pupils are equal, round, and reactive to light.  Neck: Normal range of motion. Neck supple. No JVD  present. No tracheal deviation present. No thyromegaly present.  Cardiovascular: Normal rate, regular rhythm, normal heart sounds and intact distal pulses.  Exam reveals no gallop and no friction rub.   No murmur heard. Pulmonary/Chest: Effort normal and breath sounds normal. No stridor. No respiratory distress. He has no wheezes. He has no rales. He exhibits no tenderness.  Abdominal: Soft. Bowel sounds are normal. He exhibits no distension and no mass. There is tenderness. There is no rebound and no guarding.  Musculoskeletal: Normal range of motion.  Swelling of the left ankle, no signs of infection including erythema, warmth, skin changes, nonpainful was plantar dorsiflexion.  Lymphadenopathy:    He has no cervical adenopathy.  Neurological: He is alert and oriented to person, place, and time. Coordination normal.  Skin: Skin is warm and dry.  Psychiatric: He has a normal mood and affect. His behavior is normal. Judgment and thought content normal.  Nursing note and vitals reviewed.   ED Course  Procedures (including critical care time) Labs Review Labs Reviewed  TESTOSTERONE - Abnormal; Notable for the following:    Testosterone 296 (*)    All other components within normal limits  TESTOSTERONE, % FREE - Abnormal; Notable for the following:    Testosterone-% Free 1.7 (*)    All other components within normal limits  GLUCOSE, CAPILLARY - Abnormal; Notable for the following:    Glucose-Capillary 230 (*)    All other components within normal limits  TESTOSTERONE, FREE  SEX HORMONE BINDING GLOBULIN  PSA  TESTOSTERONE  HEMOGLOBIN A1C    Imaging Review No results found.   EKG Interpretation None      MDM   Final diagnoses:  Head ache   Labs: Urinalysis, CBC, CMP, lipase, glucose- no significant findings    Patient was evaluated in the ED and attempted to leave AMA before thorough evaluation was complete. Security was able to stop him and had him transferred back  over to behavioral health. Review of his collected labs showed no concerning abnormalities that would need further evaluation.  Eyvonne MechanicJeffrey Myrle Wanek, PA-C 06/16/14 [id number]  Lorre NickAnthony Allen, MD 06/16/14 [phone]

## 2014-06-15 NOTE — ED Notes (Addendum)
Awake. Verbally responsive. A/O x4. Resp even and unlabored. No audible adventitious breath sounds noted. ABC's intact. (+)PMS, CRT brisk to foot. Sitter at bedside.

## 2014-06-15 NOTE — [facility] Counselor (Signed)
[facility] LCSW Group Therapy 06/15/2014  1:15 pm  Type of Therapy: Group Therapy Participation Level: Active  Participation Quality: Attentive, Sharing and Supportive  Affect: Depressed and Flat  Cognitive: Alert and Oriented  Insight: Developing/Improving and Engaged  Engagement in Therapy: Developing/Improving and Engaged  Modes of Intervention: Clarification, Confrontation, Discussion, Education, Exploration,  Limit-setting, Orientation, Problem-solving, Rapport Building, Dance movement psychotherapisteality Testing, Socialization and Support  Summary of Progress/Problems: Pt identified obstacles faced currently and processed barriers involved in overcoming these obstacles. Pt identified steps necessary for overcoming these obstacles and explored motivation (internal and external) for facing these difficulties head on. Pt further identified one area of concern in their lives and chose a goal to focus on for today. Patient joined group at the end of discussion but did share when encouraged. Patient identified his goal as to "get stabilized on medications and begin to live a normal happy life". Patient reports that he has been his own obstacle due to doubting himself and giving up too easily. CSW and other group members provided patient with emotional support and encouragement.  Samuella BruinKristin Sayuri Rhames, MSW, [employer]CSWA Clinical Social Worker [facility] [phone]

## 2014-06-15 NOTE — Progress Notes (Signed)
CSW met with pt at bedside. Patient informed CSW that he is currently receiving treatment at [facility]. Patient states that he lives at home with his girlfriend in [place] who is to be considered his primary support. Also, pt stated that he feels that god is a great support.  Patient informed CSW that he presents to [facility] due to both of his feet being swollen and with a burning sensation. Patient states that this started last night. Patient stated " My feet are swollen and on fire. I feel like I have a cigarette lighter on it."  Pt states that he is able to complete his ALD's independently.  Willette Brace [phone] ED CSW 06/15/2014 10:37 PM

## 2014-06-16 MED ORDER — THIORIDAZINE HCL 50 MG PO TABS
50.0000 mg | ORAL_TABLET | Freq: Every day | ORAL | Status: DC
  Administered 2014-06-16: 50 mg via ORAL
  Filled 2014-06-16 (×3): qty 1

## 2014-06-16 MED ORDER — FUROSEMIDE 20 MG PO TABS
20.0000 mg | ORAL_TABLET | Freq: Every day | ORAL | Status: DC
  Administered 2014-06-17 – 2014-06-23 (×7): 20 mg via ORAL
  Filled 2014-06-16 (×9): qty 1

## 2014-06-16 MED ORDER — POTASSIUM CHLORIDE CRYS ER 10 MEQ PO TBCR
10.0000 meq | EXTENDED_RELEASE_TABLET | Freq: Every day | ORAL | Status: DC
  Administered 2014-06-17 – 2014-06-23 (×7): 10 meq via ORAL
  Filled 2014-06-16 (×9): qty 1

## 2014-06-16 MED ORDER — FUROSEMIDE 40 MG PO TABS
40.0000 mg | ORAL_TABLET | Freq: Once | ORAL | Status: AC
  Administered 2014-06-16: 40 mg via ORAL
  Filled 2014-06-16: qty 2
  Filled 2014-06-16: qty 1

## 2014-06-16 NOTE — [facility] Group Notes (Signed)
Adult Psychoeducational Group Note  Date:  06/16/2014 Time:  10:42 PM  Group Topic/Focus:  AA Meeting  Participation Level:  Minimal  Participation Quality:  Attentive  Affect:  Flat  Cognitive:  Alert  Insight: Limited  Engagement in Group:  Limited  Modes of Intervention:  Discussion and Education  Additional Comments:  Phillip [name] attended group.  Phillip [name], Phillip [name] 06/16/2014, 10:42 PM

## 2014-06-16 NOTE — Progress Notes (Signed)
Pt is pleasant on approach and cooperative   He continues to endorse passive suicidal ideation   He is otherwise compliant with treatment   Pt on close obs   Verbal support given   Medications administered and effectiveness monitored   Pt remains safe at present

## 2014-06-16 NOTE — [facility] Group Notes (Signed)
[facility] Group Notes:  (Nursing/MHT/Case Management/Adjunct)  Date:  06/16/2014  Time: 0900 am  Type of Therapy:  Psychoeducational Skills  Participation Level:  Active  Participation Quality:  Appropriate and Attentive  Affect:  Appropriate  Cognitive:  Alert and Appropriate  Insight:  Improving  Engagement in Group:  Engaged  Modes of Intervention:  Support  Summary of Progress/Problems: Phillip [name] lent his good sense of humor throughout the group.  Phillip [name], Phillip [name] 06/16/2014, 1:42 PM

## 2014-06-16 NOTE — Progress Notes (Signed)
[name], male   DOB: 09/16/1970, 44 y.o.   MRN: [id number] [facility] MD Progress Note  06/16/2014 5:51 PM Hansel Devan  MRN:  [id number]   Subjective:  Patient states he continues to have PTSD symptoms, such as " seeing the faces of people who were killed",and struggling with nightmares that make sleep difficult.  Today he seems withdrawn, tearful and dejected.  He says he just doesn't care anymore.  "I'v made up my mind.  All my buddies came back andd they all killed themselves.   It's my turn.  I've made up my mind. I can smell the oil of the gun. Just give me a shot to knock me out. Then at least I could feel dead."  He is still endorsing suicidal thoughts- states these are chronic-makes comments about trying to find ways to hurt self on the unit.   Objective:  On unit, patient's behavior has been calm and in good control. He has continued to report ongoing suicidal ideations, but has been able to contract for safety on unit, and has not exhibited any self injurious behavior.He presents as calm, superficially cooperative, polite. He describes chronic depression, hopelessness, and PTSD symptoms as above.Denies medication side effects. His major concern at present is poor sleep, which he attributes mostly to ongoing vivid nightmares relating to combat experiences . Responds fairly well to support, encouragement, empathy.  He is concerned about ongoing peripheral edema. Discussed medications that could be contributing to edema. Patient reports taking Neurontin in the past with no negative effects. He recently started on Mellaril, which has common reaction of edema including in the hands and feet.   Principal Problem: MDD (major depressive disorder), recurrent, severe, with psychosis Diagnosis:   Patient Active Problem List   Diagnosis Date Noted  . Major depression [F32.2] 06/06/2014  . Suicidal ideations [R45.851] 06/05/2014  . Panic disorder [F41.0] 05/19/2014  . MDD (major  depressive disorder), recurrent, severe, with psychosis [F33.3] 05/18/2014  . PTSD (post-traumatic stress disorder) [F43.10] 01/29/2014  . Generalized anxiety disorder [F41.1] 06/19/2011    Class: Chronic   Total Time spent with patient: 30 minutes   Past Medical History:  Past Medical History  Diagnosis Date  . Anxiety   . Depression   . PTSD (post-traumatic stress disorder)     Past Surgical History  Procedure Laterality Date  . Cholesterol     Family History:  Family History  Problem Relation Age of Onset  . Cancer Mother   . Cancer Father   . Depression Father    Social History:  History  Alcohol Use No     History  Drug Use No    Comment: denies    History   Social History  . Marital Status: Married    Spouse Name: N/A  . Number of Children: N/A  . Years of Education: N/A   Social History Main Topics  . Smoking status: Current Every Day Smoker -- 1.00 packs/day for 19 years    Types: Cigarettes  . Smokeless tobacco: Not on file  . Alcohol Use: No  . Drug Use: No     Comment: denies  . Sexual Activity: Yes    Birth Control/ Protection: None   Other Topics Concern  . None   Social History Narrative   Additional History:    Sleep:  Reported as poor due to nightmares, but chart indicates slept 5.75 hours   Appetite:  Fair  Assessment:   Musculoskeletal: Strength & Muscle Tone: within  normal limits Gait & Station: normal Patient leans: N/A   Psychiatric Specialty Exam: Physical Exam  Vitals reviewed. Psychiatric: His speech is normal. His mood appears anxious. He is slowed and withdrawn. Cognition and memory are normal. He expresses impulsivity. He exhibits a depressed mood. He expresses suicidal ideation.    Review of Systems  Constitutional: Negative for fever, chills, weight loss, malaise/fatigue and diaphoresis.  HENT: Negative for congestion, ear discharge, ear pain, hearing loss, nosebleeds, sore throat and tinnitus.   Eyes:  Negative for blurred vision, double vision, photophobia, pain, discharge and redness.  Respiratory: Negative for cough, hemoptysis, sputum production, shortness of breath, wheezing and stridor.   Cardiovascular: Positive for leg swelling. Negative for chest pain, palpitations, orthopnea, claudication and PND.  Gastrointestinal: Negative for heartburn, nausea, vomiting, abdominal pain, diarrhea, constipation, blood in stool and melena.  Genitourinary: Negative for dysuria, urgency, frequency, hematuria and flank pain.  Musculoskeletal: Negative for myalgias, back pain, joint pain, falls and neck pain.  Skin: Negative for itching and rash.  Neurological: Negative for dizziness, tingling, tremors, sensory change, speech change, focal weakness, seizures, loss of consciousness, weakness and headaches.  Endo/Heme/Allergies: Negative for environmental allergies and polydipsia. Does not bruise/bleed easily.  Psychiatric/Behavioral: Positive for depression and suicidal ideas. The patient is nervous/anxious and has insomnia.     Blood pressure 132/86, pulse 100, temperature 97.4 F (36.3 C), temperature source Oral, resp. rate 18, height 5' 10.5" (1.791 m), weight 82.555 kg (182 lb), SpO2 99 %.Body mass index is 25.74 kg/(m^2).  General Appearance: Fairly Groomed  Engineer, water::  Good  Speech:  Clear and Coherent  Volume:  Normal  Mood:  Depressed  Affect:  Restricted, superficial   Thought Process:  Coherent and Goal Directed  Orientation:  Full (Time, Place, and Person)  Thought Content:  No hallucinations, no delusions, not internally preoccupied  Suicidal Thoughts:  Yes.  with intent/plan (shooting self with gun)- able to contract for safety on the unit, since he does not have any means to hurt himself here   Homicidal Thoughts:  No  Memory:  Remote and recent grossly intact  Judgement:  Fair  Insight:  Present  Psychomotor Activity:  Normal  Concentration:  Good  Recall:  Good  Fund of  Knowledge:Good  Language: Good  Akathisia:  No  Handed:  Right  AIMS (if indicated):     Assets:  Communication Skills Desire for Improvement Resilience  ADL's:  Intact  Cognition: WNL  Sleep:  Number of Hours: 5.75   Current Medications: Current Facility-Administered Medications  Medication Dose Route Frequency Provider Last Rate Last Dose  . acetaminophen (TYLENOL) tablet 650 mg  650 mg Oral Q4H PRN Lurena Nida, NP      . alum & mag hydroxide-simeth (MAALOX/MYLANTA) 200-200-20 MG/5ML suspension 30 mL  30 mL Oral Q4H PRN Nicholaus Bloom, MD   30 mL at 06/10/14 1651  . ARIPiprazole (ABILIFY) tablet 7.5 mg  7.5 mg Oral QHS Kerrie Buffalo, NP   7.5 mg at 06/15/14 2145  . calcium carbonate (TUMS - dosed in mg elemental calcium) chewable tablet 400 mg of elemental calcium  400 mg of elemental calcium Oral TID WC Nicholaus Bloom, MD   400 mg of elemental calcium at 06/15/14 0840  . carbamazepine (EQUETRO) 12 hr capsule 200 mg  200 mg Oral BID Nicholaus Bloom, MD   200 mg at 06/16/14 1657  . clonazePAM (KLONOPIN) tablet 1 mg  1 mg Oral QID Nicholaus Bloom, MD  1 mg at 06/16/14 1657  . feeding supplement (ENSURE COMPLETE) (ENSURE COMPLETE) liquid 237 mL  237 mL Oral BID BM Nicholaus Bloom, MD   237 mL at 06/15/14 1047  . [START ON 06/17/2014] furosemide (LASIX) tablet 20 mg  20 mg Oral Daily Niel Hummer, NP      . furosemide (LASIX) tablet 40 mg  40 mg Oral Once Niel Hummer, NP      . gabapentin (NEURONTIN) capsule 300 mg  300 mg Oral TID Nicholaus Bloom, MD   300 mg at 06/16/14 1657  . hydrOXYzine (ATARAX/VISTARIL) tablet 25 mg  25 mg Oral Q6H PRN Harriet Butte, NP   25 mg at 06/14/14 2304  . magnesium hydroxide (MILK OF MAGNESIA) suspension 30 mL  30 mL Oral Daily PRN Nicholaus Bloom, MD      . nicotine (NICODERM CQ - dosed in mg/24 hours) patch 21 mg  21 mg Transdermal Daily Lurena Nida, NP   21 mg at 06/16/14 0607  . pantoprazole (PROTONIX) EC tablet 20 mg  20 mg Oral Daily Kerrie Buffalo, NP    20 mg at 06/16/14 0804  . [START ON 06/17/2014] potassium chloride (K-DUR,KLOR-CON) CR tablet 10 mEq  10 mEq Oral Daily Niel Hummer, NP      . prazosin (MINIPRESS) capsule 3 mg  3 mg Oral QHS Skip Estimable, MD   3 mg at 06/15/14 2146  . testosterone cypionate (DEPOTESTOTERONE CYPIONATE) injection 100 mg  100 mg Intramuscular Q14 Days Nicholaus Bloom, MD   100 mg at 06/09/14 1537  . thioridazine (MELLARIL) tablet 100 mg  100 mg Oral QHS Skip Estimable, MD   100 mg at 06/15/14 2145  . zolpidem (AMBIEN) tablet 10 mg  10 mg Oral QHS PRN Skip Estimable, MD   10 mg at 06/15/14 2146    Lab Results:  Results for orders placed or performed during the hospital encounter of 06/06/14 (from the past 48 hour(s))  Glucose, capillary     Status: Abnormal   Collection Time: 06/15/14  2:33 PM  Result Value Ref Range   Glucose-Capillary 230 (H) 70 - 99 mg/dL  CBC with Differential     Status: None   Collection Time: 06/15/14  7:21 PM  Result Value Ref Range   WBC 6.5 4.0 - 10.5 K/uL   RBC 4.38 4.22 - 5.81 MIL/uL   Hemoglobin 13.8 13.0 - 17.0 g/dL   HCT 42.1 39.0 - 52.0 %   MCV 96.1 78.0 - 100.0 fL   MCH 31.5 26.0 - 34.0 pg   MCHC 32.8 30.0 - 36.0 g/dL   RDW 12.9 11.5 - 15.5 %   Platelets 221 150 - 400 K/uL   Neutrophils Relative % 46 43 - 77 %   Neutro Abs 3.0 1.7 - 7.7 K/uL   Lymphocytes Relative 38 12 - 46 %   Lymphs Abs 2.4 0.7 - 4.0 K/uL   Monocytes Relative 12 3 - 12 %   Monocytes Absolute 0.8 0.1 - 1.0 K/uL   Eosinophils Relative 4 0 - 5 %   Eosinophils Absolute 0.3 0.0 - 0.7 K/uL   Basophils Relative 0 0 - 1 %   Basophils Absolute 0.0 0.0 - 0.1 K/uL  Comprehensive metabolic panel     Status: Abnormal   Collection Time: 06/15/14  7:21 PM  Result Value Ref Range   Sodium 139 135 - 145 mmol/L   Potassium 4.1 3.5 - 5.1 mmol/L  Chloride 104 96 - 112 mmol/L   CO2 27 19 - 32 mmol/L   Glucose, Bld 96 70 - 99 mg/dL   BUN 17 6 - 23 mg/dL   Creatinine, Ser 1.01 0.50 - 1.35 mg/dL   Calcium  9.3 8.4 - 10.5 mg/dL   Total Protein 7.2 6.0 - 8.3 g/dL   Albumin 4.2 3.5 - 5.2 g/dL   AST 31 0 - 37 U/L   ALT 69 (H) 0 - 53 U/L   Alkaline Phosphatase 76 39 - 117 U/L   Total Bilirubin 0.4 0.3 - 1.2 mg/dL   GFR calc non Af Amer >90 >90 mL/min   GFR calc Af Amer >90 >90 mL/min    Comment: (NOTE) The eGFR has been calculated using the CKD EPI equation. This calculation has not been validated in all clinical situations. eGFR's persistently <90 mL/min signify possible Chronic Kidney Disease.    Anion gap 8 5 - 15  Lipase, blood     Status: None   Collection Time: 06/15/14  7:21 PM  Result Value Ref Range   Lipase 37 11 - 59 U/L  Urinalysis, Routine w reflex microscopic     Status: Abnormal   Collection Time: 06/15/14  7:47 PM  Result Value Ref Range   Color, Urine YELLOW YELLOW   APPearance CLEAR CLEAR   Specific Gravity, Urine 1.014 1.005 - 1.030   pH 6.0 5.0 - 8.0   Glucose, UA 250 (A) NEGATIVE mg/dL   Hgb urine dipstick NEGATIVE NEGATIVE   Bilirubin Urine NEGATIVE NEGATIVE   Ketones, ur NEGATIVE NEGATIVE mg/dL   Protein, ur NEGATIVE NEGATIVE mg/dL   Urobilinogen, UA 0.2 0.0 - 1.0 mg/dL   Nitrite NEGATIVE NEGATIVE   Leukocytes, UA NEGATIVE NEGATIVE    Comment: MICROSCOPIC NOT DONE ON URINES WITH NEGATIVE PROTEIN, BLOOD, LEUKOCYTES, NITRITE, OR GLUCOSE <1000 mg/dL.    Physical Findings: AIMS: Facial and Oral Movements Muscles of Facial Expression: None, normal Lips and Perioral Area: None, normal Jaw: None, normal Tongue: None, normal,Extremity Movements Upper (arms, wrists, hands, fingers): None, normal Lower (legs, knees, ankles, toes): None, normal, Trunk Movements Neck, shoulders, hips: None, normal, Overall Severity Severity of abnormal movements (highest score from questions above): None, normal Incapacitation due to abnormal movements: None, normal Patient's awareness of abnormal movements (rate only patient's report): No Awareness, Dental Status Current  problems with teeth and/or dentures?: No Does patient usually wear dentures?: No  CIWA:  CIWA-Ar Total: 1 COWS:  COWS Total Score: 3   Assessment- patient is depressed, has chronic suicidal ideations, but is able to contract for safety on unit. He is tolerating medications well, major concern at this time is insomnia/ PTSD related nightmares.  Pt started having bilateral lower extremity edema day before yesterday, which pt reports is worsening and becoming painful.   Treatment Plan Summary: Daily contact with patient to assess and evaluate symptoms and progress in treatment, Medication management, Plan continue inpatient treatment  and medications as below    As discussed with staff, patient currently on waiting list for [facility]. Continue Abilify 7.5 mgrs QDAY  Carbamazepine 200 mgrs BID Klonopin 1 mgr QID Neurontin 300 mgrs TID Ambien 10 mgrs QHS PRN Insomnia Will decrease Mellaril to 50 mgrs QHS, in order to evaluate if the edema improves with this change.  Continue Minipress 3 mgrs QHS Suicidal ideas; continue to work to instill hope. He is still contracting for safety. Patient was sent to the ED yesterday to evaluate lower extremity edema. Give lasix  40 mg once today for edema, then lasix 20 mg daily with K-dur to prevent hypokalemia.   Medical Decision Making:  Established Problem, Stable/Improving (1), Review of Psycho-Social Stressors (1), Review of Medication Regimen & Side Effects (2) and Review of New Medication or Change in Dosage (2)  DAVIS, LAURA, NP-C 06/16/2014, 5:51 PM Patient seen face-to-face for psychiatric evaluation, chart reviewed and case discussed with the physician extender and developed treatment plan. Reviewed the information documented and agree with the treatment plan. Corena Pilgrim, MD

## 2014-06-16 NOTE — [facility] Group Notes (Signed)
[facility] LCSW Group Therapy  06/16/2014 1:20 PM  Type of Therapy:  Group Therapy  Participation Level:  Active  Participation Quality:  Attentive  Affect:  Appropriate  Cognitive:  Alert and Oriented  Insight:  Limited  Engagement in Therapy:  Improving  Modes of Intervention:  Discussion, Education, Exploration, Limit-setting, Problem-solving, Rapport Building, Socialization and Support  Summary of Progress/Problems: [facility] Speaker came to talk about his personal journey with substance abuse and addiction. The pt processed ways by which to relate to the speaker. [facility] speaker provided handouts and educational information pertaining to groups and services offered by [facility].    Smart, Hau Sanor LCSWA 06/16/2014, 1:20 PM

## 2014-06-16 NOTE — Progress Notes (Signed)
Close Observation Note  D: Currently patient is attending a group session; calm and cooperative.  A: Maintaining Q15 minute checks for safety.  R: Patient remains safe on the hall.

## 2014-06-16 NOTE — Progress Notes (Signed)
Close Observation Note  D: Patient is in the dayroom, playing cards with another peer; pt. continues to have bilateral edema in the lower extremities; no s/s of distress noted and even and unlabored breathing.  A: Monitor Q15 minute checks for safety.  R: Remains safe on the unit.

## 2014-06-16 NOTE — Progress Notes (Signed)
Pt stated to sitter that he was going to kill himself tonight.

## 2014-06-16 NOTE — Progress Notes (Signed)
Close obs note: D. Pt in bed at this time, resting quietly with eyes closed, respirations even and unlabored and does not appear to be in any acute distress. A. Safety maintained. R. Will continue to monitor.

## 2014-06-16 NOTE — Progress Notes (Signed)
Close Observation Note  D: Patient is standing at the medication window to be administered his medications before dinner; he has complaints of swelling now moving from his feet to both legs.  A: Writer has assessed the patient's legs and reported findings to the NP. Continue to maintain Q15 minute checks for safety.  R: Patient receptive and remains safe on the hall.

## 2014-06-16 NOTE — Progress Notes (Signed)
Close obs note: D. Pt in bed at this time, resting quietly with eyes closed, respirations even and unlabored and does not appear to be in any acute distress. A. Safety maintained. R. Will continue to monitor.        

## 2014-06-17 LAB — RAPID URINE DRUG SCREEN, HOSP PERFORMED
Amphetamines: NOT DETECTED
BARBITURATES: NOT DETECTED
Benzodiazepines: POSITIVE — AB
Cocaine: NOT DETECTED
OPIATES: NOT DETECTED
TETRAHYDROCANNABINOL: NOT DETECTED

## 2014-06-17 LAB — HEMOGLOBIN A1C
Hgb A1c MFr Bld: 5.7 % — ABNORMAL HIGH (ref 4.8–5.6)
Mean Plasma Glucose: 117 mg/dL

## 2014-06-17 MED ORDER — BENZTROPINE MESYLATE 1 MG PO TABS
1.0000 mg | ORAL_TABLET | Freq: Four times a day (QID) | ORAL | Status: DC | PRN
  Administered 2014-06-17 – 2014-06-18 (×2): 1 mg via ORAL
  Filled 2014-06-17 (×2): qty 1

## 2014-06-17 MED ORDER — CHLORPROMAZINE HCL 100 MG PO TABS
100.0000 mg | ORAL_TABLET | Freq: Every day | ORAL | Status: DC
  Filled 2014-06-17: qty 1

## 2014-06-17 MED ORDER — PRAZOSIN HCL 2 MG PO CAPS
3.0000 mg | ORAL_CAPSULE | Freq: Every day | ORAL | Status: DC
  Administered 2014-06-17 – 2014-06-22 (×6): 3 mg via ORAL
  Filled 2014-06-17 (×7): qty 1

## 2014-06-17 MED ORDER — ARIPIPRAZOLE 15 MG PO TABS
7.5000 mg | ORAL_TABLET | Freq: Every day | ORAL | Status: DC
Start: 2014-06-17 — End: 2014-06-23
  Administered 2014-06-17 – 2014-06-22 (×6): 7.5 mg via ORAL
  Filled 2014-06-17 (×7): qty 1

## 2014-06-17 MED ORDER — HALOPERIDOL 5 MG PO TABS
5.0000 mg | ORAL_TABLET | Freq: Four times a day (QID) | ORAL | Status: DC | PRN
  Administered 2014-06-17 – 2014-06-18 (×2): 5 mg via ORAL
  Filled 2014-06-17 (×2): qty 1

## 2014-06-17 MED ORDER — CHLORPROMAZINE HCL 50 MG PO TABS
100.0000 mg | ORAL_TABLET | Freq: Every day | ORAL | Status: DC
  Administered 2014-06-17 – 2014-06-22 (×6): 100 mg via ORAL
  Filled 2014-06-17: qty 1
  Filled 2014-06-17 (×4): qty 2
  Filled 2014-06-17: qty 1
  Filled 2014-06-17 (×2): qty 2

## 2014-06-17 MED ORDER — DIPHENHYDRAMINE HCL 50 MG PO CAPS
50.0000 mg | ORAL_CAPSULE | Freq: Once | ORAL | Status: AC
  Administered 2014-06-17: 50 mg via ORAL
  Filled 2014-06-17: qty 2
  Filled 2014-06-17: qty 1

## 2014-06-17 NOTE — Clinical Social Work Note (Signed)
[facility] RN/AC [facility] and [facility] CSW spoke with Junious Dresseronnie about concerns with pt regarding chronic SI, HI toward doctors, and regarding pt cheeking medications in order to overdose. Junious DresserConnie stated that she would review this information today and get back to us.   The Sherwin-WilliamsHeather Smart, LCSWA 06/17/2014 2:43 PM

## 2014-06-17 NOTE — Progress Notes (Signed)
D: Pt refused to discuss SI and HI with RN this morning.  Pt wrote on self inventory that pt is currently having SI.  Pt states having "flashbacks" this morning.  Pt's goal today is to "work on thoughts that are not real (flashbacks)."  Per staff, pt expressing HI toward provider.    A: Patient given emotional support from RN. Patient encouraged to come to staff with concerns and/or questions. Patient's medication routine continued. Patient's orders and plan of care reviewed. Will continue to monitor patient q15 minutes for safety.  Pt placed on 1:1 for safety.     R: Patient remains appropriate.  1:1 continued for safety.

## 2014-06-17 NOTE — Progress Notes (Signed)
Pt is resting in bed with eyes closed no distress noted   Pt on close obs for safety  And is presently safe

## 2014-06-17 NOTE — Progress Notes (Signed)
D - Pt expressed HI toward provider.   A - Pt placed on 1:1 R - PT safety maintained.  1:1 continued for pt safety.

## 2014-06-17 NOTE — Progress Notes (Signed)
[name], male   DOB: 16-Aug-1970, 44 y.o.   MRN: [id number] [facility] MD Progress Note  06/17/2014 1:09 PM Phillip [name]  MRN:  [id number]   Subjective:  Patient was interviewed in dayroom during lunchtime (with CO present), since peers had gone to the cafeteria for lunch. Chart reviewed. Case discussed with [facility] staff. Pt reports edema has slowly improved with diuretic (lasix), and is less painful. Pt reports being "irate" because he got in trouble for reportedly cheeking his meds. However, he states that he takes his meds daily, because he needs them. Nursing staff report that pt was overheard during rec therapy stating to peers that he was hoarding his meds, with a plan to overdose on them.  He is still endorsing suicidal thoughts- states these are chronic-makes comments about trying to find ways to hurt self on the unit.   Objective:  On unit, patient's behavior has been calm and in good control. He has continued to report ongoing suicidal ideations, but has been able to contract for safety on unit, and has not exhibited any self injurious behavior.He presents as calm, superficially cooperative, polite. He describes chronic depression, hopelessness, and PTSD symptoms as above.Denies medication side effects. Responds fairly well to support, encouragement, empathy.  He is concerned about ongoing peripheral edema. Discussed medications that could be contributing to edema. Patient reports taking Neurontin in the past with no negative effects. He recently started on Mellaril, which has common reaction of edema including in the hands and feet.   Principal Problem: MDD (major depressive disorder), recurrent, severe, with psychosis Diagnosis:   Patient Active Problem List   Diagnosis Date Noted  . Major depression [F32.2] 06/06/2014  . Suicidal ideations [R45.851] 06/05/2014  . Panic disorder [F41.0] 05/19/2014  . MDD (major depressive disorder), recurrent, severe, with psychosis [F33.3]  05/18/2014  . PTSD (post-traumatic stress disorder) [F43.10] 01/29/2014  . Generalized anxiety disorder [F41.1] 06/19/2011    Class: Chronic   Total Time spent with patient: 30 minutes   Past Medical History:  Past Medical History  Diagnosis Date  . Anxiety   . Depression   . PTSD (post-traumatic stress disorder)     Past Surgical History  Procedure Laterality Date  . Cholesterol     Family History:  Family History  Problem Relation Age of Onset  . Cancer Mother   . Cancer Father   . Depression Father    Social History:  History  Alcohol Use No     History  Drug Use No    Comment: denies    History   Social History  . Marital Status: Married    Spouse Name: N/A  . Number of Children: N/A  . Years of Education: N/A   Social History Main Topics  . Smoking status: Current Every Day Smoker -- 1.00 packs/day for 19 years    Types: Cigarettes  . Smokeless tobacco: Not on file  . Alcohol Use: No  . Drug Use: No     Comment: denies  . Sexual Activity: Yes    Birth Control/ Protection: None   Other Topics Concern  . None   Social History Narrative   Additional History:    Sleep:  Fair (waking up often last night to urinate, due to lasix), chart indicates slept 6.75 hours  Appetite:  Fair  Assessment:   Musculoskeletal: Strength & Muscle Tone: within normal limits Gait & Station: normal Patient leans: N/A   Psychiatric Specialty Exam: Physical Exam  Vitals reviewed. Psychiatric:  His speech is normal. His mood appears anxious. He is slowed and withdrawn. Cognition and memory are normal. He expresses impulsivity. He exhibits a depressed mood. He expresses suicidal ideation.    ROS  Blood pressure 128/83, pulse 117, temperature 97.8 F (36.6 C), temperature source Oral, resp. rate 20, height 5' 10.5" (1.791 m), weight 82.555 kg (182 lb), SpO2 99 %.Body mass index is 25.74 kg/(m^2).  General Appearance: Fairly Groomed  Engineer, water::  Good  Speech:   Clear and Coherent  Volume:  Normal  Mood:  Depressed  Affect:  Restricted, superficial   Thought Process:  Coherent and Goal Directed  Orientation:  Full (Time, Place, and Person)  Thought Content:  No hallucinations, no delusions, not internally preoccupied  Suicidal Thoughts:  Yes.  with intent/plan (shooting self with gun or overdosing on pills)- able to contract for safety on the unit, since he does not have any means to hurt himself here   Homicidal Thoughts:  No, but he does not want to see Dr. Parke Poisson again (because he would want to hurt him, for decreasing his ambien dose)  Memory:  Remote and recent grossly intact  Judgement:  Poor  Insight:  Shallow  Psychomotor Activity:  Decreased  Concentration:  Good  Recall:  Good  Fund of Knowledge:Good  Language: Good  Akathisia:  No  Handed:  Right  AIMS (if indicated):     Assets:  Communication Skills Desire for Improvement Resilience  ADL's:  Intact  Cognition: WNL  Sleep:  Number of Hours: 6.75   Current Medications: Current Facility-Administered Medications  Medication Dose Route Frequency Provider Last Rate Last Dose  . acetaminophen (TYLENOL) tablet 650 mg  650 mg Oral Q4H PRN Lurena Nida, NP      . alum & mag hydroxide-simeth (MAALOX/MYLANTA) 200-200-20 MG/5ML suspension 30 mL  30 mL Oral Q4H PRN Nicholaus Bloom, MD   30 mL at 06/10/14 1651  . ARIPiprazole (ABILIFY) tablet 7.5 mg  7.5 mg Oral QHS Kerrie Buffalo, NP   7.5 mg at 06/16/14 2020  . calcium carbonate (TUMS - dosed in mg elemental calcium) chewable tablet 400 mg of elemental calcium  400 mg of elemental calcium Oral TID WC Nicholaus Bloom, MD   400 mg of elemental calcium at 06/15/14 0840  . carbamazepine (EQUETRO) 12 hr capsule 200 mg  200 mg Oral BID Nicholaus Bloom, MD   200 mg at 06/17/14 [phone]  . clonazePAM (KLONOPIN) tablet 1 mg  1 mg Oral QID Nicholaus Bloom, MD   1 mg at 06/17/14 1145  . feeding supplement (ENSURE COMPLETE) (ENSURE COMPLETE) liquid 237 mL  237  mL Oral BID BM Nicholaus Bloom, MD   237 mL at 06/17/14 0934  . furosemide (LASIX) tablet 20 mg  20 mg Oral Daily Niel Hummer, NP   20 mg at 06/17/14 0839  . gabapentin (NEURONTIN) capsule 300 mg  300 mg Oral TID Nicholaus Bloom, MD   300 mg at 06/17/14 1144  . hydrOXYzine (ATARAX/VISTARIL) tablet 25 mg  25 mg Oral Q6H PRN Harriet Butte, NP   25 mg at 06/16/14 2028  . magnesium hydroxide (MILK OF MAGNESIA) suspension 30 mL  30 mL Oral Daily PRN Nicholaus Bloom, MD      . nicotine (NICODERM CQ - dosed in mg/24 hours) patch 21 mg  21 mg Transdermal Daily Lurena Nida, NP   21 mg at 06/17/14 0836  . pantoprazole (PROTONIX) EC tablet  20 mg  20 mg Oral Daily Kerrie Buffalo, NP   20 mg at 06/17/14 [phone]  . potassium chloride (K-DUR,KLOR-CON) CR tablet 10 mEq  10 mEq Oral Daily Niel Hummer, NP   10 mEq at 06/17/14 0839  . prazosin (MINIPRESS) capsule 3 mg  3 mg Oral QHS Skip Estimable, MD   3 mg at 06/16/14 2021  . testosterone cypionate (DEPOTESTOTERONE CYPIONATE) injection 100 mg  100 mg Intramuscular Q14 Days Nicholaus Bloom, MD   100 mg at 06/09/14 1537  . thioridazine (MELLARIL) tablet 50 mg  50 mg Oral QHS Niel Hummer, NP   50 mg at 06/16/14 2021  . zolpidem (AMBIEN) tablet 10 mg  10 mg Oral QHS PRN Skip Estimable, MD   10 mg at 06/16/14 2021    Lab Results:  Results for orders placed or performed during the hospital encounter of 06/06/14 (from the past 48 hour(s))  Glucose, capillary     Status: Abnormal   Collection Time: 06/15/14  2:33 PM  Result Value Ref Range   Glucose-Capillary 230 (H) 70 - 99 mg/dL  Hemoglobin A1c     Status: Abnormal   Collection Time: 06/15/14  2:35 PM  Result Value Ref Range   Hgb A1c MFr Bld 5.7 (H) 4.8 - 5.6 %    Comment: (NOTE)         Pre-diabetes: 5.7 - 6.4         Diabetes: >6.4         Glycemic control for adults with diabetes: <7.0    Mean Plasma Glucose 117 mg/dL    Comment: (NOTE) Performed At: [facility] [place], [place]  [id number] Lindon Romp MD [id number] Performed at [facility]   CBC with Differential     Status: None   Collection Time: 06/15/14  7:21 PM  Result Value Ref Range   WBC 6.5 4.0 - 10.5 K/uL   RBC 4.38 4.22 - 5.81 MIL/uL   Hemoglobin 13.8 13.0 - 17.0 g/dL   HCT 42.1 39.0 - 52.0 %   MCV 96.1 78.0 - 100.0 fL   MCH 31.5 26.0 - 34.0 pg   MCHC 32.8 30.0 - 36.0 g/dL   RDW 12.9 11.5 - 15.5 %   Platelets 221 150 - 400 K/uL   Neutrophils Relative % 46 43 - 77 %   Neutro Abs 3.0 1.7 - 7.7 K/uL   Lymphocytes Relative 38 12 - 46 %   Lymphs Abs 2.4 0.7 - 4.0 K/uL   Monocytes Relative 12 3 - 12 %   Monocytes Absolute 0.8 0.1 - 1.0 K/uL   Eosinophils Relative 4 0 - 5 %   Eosinophils Absolute 0.3 0.0 - 0.7 K/uL   Basophils Relative 0 0 - 1 %   Basophils Absolute 0.0 0.0 - 0.1 K/uL  Comprehensive metabolic panel     Status: Abnormal   Collection Time: 06/15/14  7:21 PM  Result Value Ref Range   Sodium 139 135 - 145 mmol/L   Potassium 4.1 3.5 - 5.1 mmol/L   Chloride 104 96 - 112 mmol/L   CO2 27 19 - 32 mmol/L   Glucose, Bld 96 70 - 99 mg/dL   BUN 17 6 - 23 mg/dL   Creatinine, Ser 1.01 0.50 - 1.35 mg/dL   Calcium 9.3 8.4 - 10.5 mg/dL   Total Protein 7.2 6.0 - 8.3 g/dL   Albumin 4.2 3.5 - 5.2 g/dL   AST  31 0 - 37 U/L   ALT 69 (H) 0 - 53 U/L   Alkaline Phosphatase 76 39 - 117 U/L   Total Bilirubin 0.4 0.3 - 1.2 mg/dL   GFR calc non Af Amer >90 >90 mL/min   GFR calc Af Amer >90 >90 mL/min    Comment: (NOTE) The eGFR has been calculated using the CKD EPI equation. This calculation has not been validated in all clinical situations. eGFR's persistently <90 mL/min signify possible Chronic Kidney Disease.    Anion gap 8 5 - 15  Lipase, blood     Status: None   Collection Time: 06/15/14  7:21 PM  Result Value Ref Range   Lipase 37 11 - 59 U/L  Urinalysis, Routine w reflex microscopic     Status: Abnormal   Collection Time: 06/15/14  7:47 PM  Result Value Ref  Range   Color, Urine YELLOW YELLOW   APPearance CLEAR CLEAR   Specific Gravity, Urine 1.014 1.005 - 1.030   pH 6.0 5.0 - 8.0   Glucose, UA 250 (A) NEGATIVE mg/dL   Hgb urine dipstick NEGATIVE NEGATIVE   Bilirubin Urine NEGATIVE NEGATIVE   Ketones, ur NEGATIVE NEGATIVE mg/dL   Protein, ur NEGATIVE NEGATIVE mg/dL   Urobilinogen, UA 0.2 0.0 - 1.0 mg/dL   Nitrite NEGATIVE NEGATIVE   Leukocytes, UA NEGATIVE NEGATIVE    Comment: MICROSCOPIC NOT DONE ON URINES WITH NEGATIVE PROTEIN, BLOOD, LEUKOCYTES, NITRITE, OR GLUCOSE <1000 mg/dL.    Physical Findings: AIMS: Facial and Oral Movements Muscles of Facial Expression: None, normal Lips and Perioral Area: None, normal Jaw: None, normal Tongue: None, normal,Extremity Movements Upper (arms, wrists, hands, fingers): None, normal Lower (legs, knees, ankles, toes): None, normal, Trunk Movements Neck, shoulders, hips: None, normal, Overall Severity Severity of abnormal movements (highest score from questions above): None, normal Incapacitation due to abnormal movements: None, normal Patient's awareness of abnormal movements (rate only patient's report): No Awareness, Dental Status Current problems with teeth and/or dentures?: No Does patient usually wear dentures?: No  CIWA:  CIWA-Ar Total: 1 COWS:  COWS Total Score: 3   Assessment- patient is depressed, has chronic suicidal ideations, but is able to contract for safety on unit.Pt started having bilateral lower extremity edema 3 days ago, which is starting to resolve with diuretic.  Treatment Plan Summary: Daily contact with patient to assess and evaluate symptoms and progress in treatment, Medication management, Plan continue inpatient treatment  and medications as below    As discussed with staff, patient currently on waiting list for [facility]. Continue Abilify 7.5 mgrs QDAY  Carbamazepine 200 mgrs BID Klonopin 1 mgr QID Neurontin 300 mgrs TID Ambien 10 mgrs QHS PRN Insomnia Will d/c  Mellaril, since edema is likely caused by this med (only new med per pt report).  Will start thorazine 100 mg QHS for insomnia/mood. Continue Minipress 3 mgrs QHS Suicidal ideas; continue to work to instill hope. He is still contracting for safety. Will change observation to 1:1, due to report of pt hoarding meds (with a plan to overdose). Will order UDS, to ensure pt is taking klonopin. Pt consents to a room and body search as well (for any contraband/meds). Continue lasix 20 mg daily with K-dur to prevent hypokalemia. However, this can probably be d/c'ed in a few days, if the edema resolves after stopping mellaril.  Medical Decision Making:  Established Problem, Stable/Improving (1), Review of Psycho-Social Stressors (1), Review of Medication Regimen & Side Effects (2) and Review of New Medication or  Change in Dosage (2)  Dereck Leep, MD  06/17/2014, 1:09 PM

## 2014-06-17 NOTE — Progress Notes (Signed)
Pt in bed in his room awakens easily  He told staff yesterday that it was normal for him to get up around 4am   Pt is on close observation due to safety issues   He is currently safe

## 2014-06-17 NOTE — Progress Notes (Signed)
Pt is anxious and depressed   Saying" I got to get out of here to kill myself   i dont deserve to live"     Notified pa and received orders and administered additional medications   Pt is on a 1:1 for safety   Talked to pt about his feelings and discussed ways he could help himself to relax through relaxation techniques   Pt is safe at present

## 2014-06-17 NOTE — Progress Notes (Signed)
Recreation Therapy Notes  Animal-Assisted Activity/Therapy (AAA/T) Program Checklist/Progress Notes Patient Eligibility Criteria Checklist & Daily Group note for Rec Tx Intervention  Date: 03.22.2016 Time: 2:45pm Location: [address] Dayroom   AAA/T Program Assumption of Risk Form signed by Patient/ or Parent Legal Guardian yes  Patient is free of allergies or sever asthma yes  Patient reports no fear of animals yes  Patient reports no history of cruelty to animals yes  Patient understands his/her participation is voluntary yes  Behavioral Response: Did not attend.   Phillip [name], Phillip [name]  Phillip [name] 06/17/2014 8:18 AM 

## 2014-06-17 NOTE — Treatment Plan (Signed)
Pt overheard in the courtyard discussing with a peer a plan to have other patients cheek their medications in order to provide them to this patient so he may overdose on the unit.  Pt observation increased from close obs to 1:1 to ensure pt safety on the unit.  Change in observation explained to pt who, with bizarre affect, verbalized understanding.

## 2014-06-17 NOTE — [facility] Group Notes (Signed)
[facility]   06/17/2014 9:45 AM  Participation Quality:  Appropriate   Mood/Affect:  Appropriate  Depression Rating:  10  Anxiety Rating:  10  Thoughts of Suicide:  Yes Will you contract for safety?   Yes  Current AVH:  No  Plan for Discharge/Comments:  Pt pleasant and calm during group. He continues to rate dep/anx as 10 and reports passive Si but is able to contract for safety on unit. Pt continues to make comments regarding HI toward MD's. "I only want Dr. Dub MikesLugo and will wait until he comes back." Pt asking to go to substance abuse treatment facility-CSW informed pt that he does not have substance abuse issue and cannot be referred. Pt [facility] and asked if CSW could lie and tell treatment center that he is abusing alcohol or adderall. CSW told pt that no, we could not do that. Pt continues to present with bizarre presentation and is reporting impulsivity and anger, although he is not observably angry. Pt spent group coloring pictures with other patients.   Transportation Means: unknown at this time.   Supports: none identified by pt.   Smart, American FinancialHeather LCSWA

## 2014-06-17 NOTE — Progress Notes (Signed)
Recreation Therapy Notes  Date: 03.23.2016 Time: 9:30am Location: [address] Dayroom   Group Topic: Stress Management  Goal Area(s) Addresses:  Patient will actively participate in stress management techniques presented during session.   Behavioral Response: Did not attend.   Marykay Lexenise L Avree Szczygiel, LRT/CTRS  Jearl KlinefelterBlanchfield, Keddrick Wyne L 06/17/2014 3:58 PM

## 2014-06-18 NOTE — Progress Notes (Signed)
Patient ID: Phillip SailorsSteven [name], male   DOB: 1970-09-08, 44 y.o.   MRN: [id number] [facility] Note  06/18/2014 10:45 AM Phillip SailorsSteven Nuncio  MRN:  [id number]   Subjective: ''I am still feeling anxious, depressed and suicidal.''   Objective:  Patient seen and chart reviewed. 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 He is reporting chronic depressive symptoms, anhedonia, hopelessness, worthlessness, lack of motivation, chronic low energy level, nightmares, weird dreams of the war, chronic sense of forshorten future and difficulty sleeping due to excessive worries. Also, he reports history of recurrent suicidal thoughts which is worse at night but today patient only verbalizes passive suicidal ideations and able to contract for safety on unit. He has not exhibited any self injurious behavior on the unit but states that he feels unsafe at his home. Patient denies psychosis or delusional thinking, reports occasional cannabis abuse prior to his admission but denies other drug of abuse. He now reports that his current medications have started to kick in but he is concerned about ongoing peripheral edema which started few days ago.   Principal Problem: PTSD (post-traumatic stress disorder) Diagnosis:   Patient Active Problem List   Diagnosis Date Noted  . Suicidal ideations [R45.851] 06/05/2014    Priority: High  . MDD (major depressive disorder), recurrent, severe, with psychosis [F33.3] 05/18/2014    Priority: High  . PTSD (post-traumatic stress disorder) [F43.10] 01/29/2014    Priority: High  . Generalized anxiety disorder [F41.1] 06/19/2011    Priority: High    Class: Chronic  . Major depression [F32.2] 06/06/2014  . Panic disorder [F41.0] 05/19/2014   Total Time spent with patient: 30 minutes   Past Medical History:  Past Medical History  Diagnosis Date  . Anxiety   . Depression    . PTSD (post-traumatic stress disorder)     Past Surgical History  Procedure Laterality Date  . Cholesterol     Family History:  Family History  Problem Relation Age of Onset  . Cancer Mother   . Cancer Father   . Depression Father    Social History:  History  Alcohol Use No     History  Drug Use No    Comment: denies    History   Social History  . Marital Status: Married    Spouse Name: N/A  . Number of Children: N/A  . Years of Education: N/A   Social History Main Topics  . Smoking status: Current Every Day Smoker -- 1.00 packs/day for 19 years    Types: Cigarettes  . Smokeless tobacco: Not on file  . Alcohol Use: No  . Drug Use: No     Comment: denies  . Sexual Activity: Yes    Birth Control/ Protection: None   Other Topics Concern  . None   Social History Narrative   Additional History:    Sleep:  Fair (waking up often last night to urinate, due to lasix), chart indicates slept 6.75 hours  Appetite:  Fair  Assessment:   Musculoskeletal: Strength & Muscle Tone: within normal limits Gait & Station: normal Patient leans: N/A   Psychiatric Specialty Exam: Physical Exam  Vitals reviewed. Psychiatric: His speech is normal. His mood appears anxious. He is slowed and withdrawn. Cognition and memory are normal. He expresses impulsivity. He exhibits a depressed mood. He expresses suicidal ideation.    ROS  Blood pressure 126/87, pulse  118, temperature 97.5 F (36.4 C), temperature source Oral, resp. rate 16, height 5' 10.5" (1.791 m), weight 82.555 kg (182 lb), SpO2 99 %.Body mass index is 25.74 kg/(m^2).  General Appearance: Fairly Groomed  Patent attorney::  Good  Speech:  Clear and Coherent  Volume:  Normal  Mood:  Depressed  Affect:  Restricted, superficial   Thought Process:  Coherent and Goal Directed  Orientation:  Full (Time, Place, and Person)  Thought Content:  No hallucinations, no delusions, not internally preoccupied  Suicidal Thoughts:   Yes.  with intent/plan (shooting self with gun or overdosing on pills)- able to contract for safety on the unit, since he does not have any means to hurt himself here   Homicidal Thoughts:  No, but he does not want to see Dr. Jama Flavors again (because he would want to hurt him, for decreasing his ambien dose)  Memory:  Remote and recent grossly intact  Judgement:  Poor  Insight:  Shallow  Psychomotor Activity:  Decreased  Concentration:  Good  Recall:  Good  Fund of Knowledge:Good  Language: Good  Akathisia:  No  Handed:  Right  AIMS (if indicated):     Assets:  Communication Skills Desire for Improvement Resilience  ADL's:  Intact  Cognition: WNL  Sleep:  Number of Hours: 6.5   Current Medications: Current Facility-Administered Medications  Medication Dose Route Frequency Provider Last Rate Last Dose  . acetaminophen (TYLENOL) tablet 650 mg  650 mg Oral Q4H PRN Kristeen Mans, NP      . alum & mag hydroxide-simeth (MAALOX/MYLANTA) 200-200-20 MG/5ML suspension 30 mL  30 mL Oral Q4H PRN Rachael Fee, MD   30 mL at 06/10/14 1651  . ARIPiprazole (ABILIFY) tablet 7.5 mg  7.5 mg Oral QHS Caprice Kluver, MD   7.5 mg at 06/17/14 1955  . haloperidol (HALDOL) tablet 5 mg  5 mg Oral Q6H PRN Kerry Hough, PA-C   5 mg at 06/17/14 2244   And  . benztropine (COGENTIN) tablet 1 mg  1 mg Oral Q6H PRN Kerry Hough, PA-C   1 mg at 06/17/14 2244  . carbamazepine (EQUETRO) 12 hr capsule 200 mg  200 mg Oral BID Rachael Fee, MD   200 mg at 06/18/14 0748  . chlorproMAZINE (THORAZINE) tablet 100 mg  100 mg Oral QHS Caprice Kluver, MD   100 mg at 06/17/14 1955  . clonazePAM (KLONOPIN) tablet 1 mg  1 mg Oral QID Rachael Fee, MD   1 mg at 06/18/14 0748  . feeding supplement (ENSURE COMPLETE) (ENSURE COMPLETE) liquid 237 mL  237 mL Oral BID BM Rachael Fee, MD   237 mL at 06/17/14 1419  . furosemide (LASIX) tablet 20 mg  20 mg Oral Daily Thermon Leyland, NP   20 mg at 06/18/14 0748  . gabapentin  (NEURONTIN) capsule 300 mg  300 mg Oral TID Rachael Fee, MD   300 mg at 06/18/14 0748  . hydrOXYzine (ATARAX/VISTARIL) tablet 25 mg  25 mg Oral Q6H PRN Worthy Flank, NP   25 mg at 06/16/14 2028  . magnesium hydroxide (MILK OF MAGNESIA) suspension 30 mL  30 mL Oral Daily PRN Rachael Fee, MD      . nicotine (NICODERM CQ - dosed in mg/24 hours) patch 21 mg  21 mg Transdermal Daily Kristeen Mans, NP   21 mg at 06/18/14 0749  . pantoprazole (PROTONIX) EC tablet 20 mg  20 mg  Oral Daily Adonis Brook, NP   20 mg at 06/18/14 0748  . potassium chloride (K-DUR,KLOR-CON) CR tablet 10 mEq  10 mEq Oral Daily Thermon Leyland, NP   10 mEq at 06/18/14 0748  . prazosin (MINIPRESS) capsule 3 mg  3 mg Oral QHS Caprice Kluver, MD   3 mg at 06/17/14 1955  . testosterone cypionate (DEPOTESTOTERONE CYPIONATE) injection 100 mg  100 mg Intramuscular Q14 Days Rachael Fee, MD   100 mg at 06/09/14 1537  . zolpidem (AMBIEN) tablet 10 mg  10 mg Oral QHS PRN Caprice Kluver, MD   10 mg at 06/17/14 1955    Lab Results:  Results for orders placed or performed during the hospital encounter of 06/06/14 (from the past 48 hour(s))  Urine rapid drug screen (hosp performed)     Status: Abnormal   Collection Time: 06/17/14  2:00 PM  Result Value Ref Range   Opiates NONE DETECTED NONE DETECTED   Cocaine NONE DETECTED NONE DETECTED   Benzodiazepines POSITIVE (A) NONE DETECTED   Amphetamines NONE DETECTED NONE DETECTED   Tetrahydrocannabinol NONE DETECTED NONE DETECTED   Barbiturates NONE DETECTED NONE DETECTED    Comment:        DRUG SCREEN FOR MEDICAL PURPOSES ONLY.  IF CONFIRMATION IS NEEDED FOR ANY PURPOSE, NOTIFY LAB WITHIN 5 DAYS.        LOWEST DETECTABLE LIMITS FOR URINE DRUG SCREEN Drug Class       Cutoff (ng/mL) Amphetamine      1000 Barbiturate      200 Benzodiazepine   200 Tricyclics       300 Opiates          300 Cocaine          300 THC              50 Performed at [facility]      Physical Findings: AIMS: Facial and Oral Movements Muscles of Facial Expression: None, normal Lips and Perioral Area: None, normal Jaw: None, normal Tongue: None, normal,Extremity Movements Upper (arms, wrists, hands, fingers): None, normal Lower (legs, knees, ankles, toes): None, normal, Trunk Movements Neck, shoulders, hips: None, normal, Overall Severity Severity of abnormal movements (highest score from questions above): None, normal Incapacitation due to abnormal movements: None, normal Patient's awareness of abnormal movements (rate only patient's report): No Awareness, Dental Status Current problems with teeth and/or dentures?: No Does patient usually wear dentures?: No  CIWA:  CIWA-Ar Total: 1 COWS:  COWS Total Score: 3   Assessment- patient is depressed, has chronic suicidal ideations, but is able to contract for safety on unit.Pt started having bilateral lower extremity edema 3 days ago, which is starting to resolve with diuretic.  Treatment Plan Summary: Daily contact with patient to assess and evaluate symptoms and progress in treatment, Medication management, Plan continue inpatient treatment  and medications as below    As discussed with staff, patient currently on waiting list for [facility]. Medication management: Will continue Abilify 7.5 mgrs QDAY  Carbamazepine 200 mgrs BID Klonopin 1 mgr QID Neurontin 300 mgrs TID Ambien 10 mgrs QHS PRN Insomnia Thorazine 100 mg QHS for insomnia/mood. Continue Minipress 3 mgrs QHS Suicidal ideas; continue to work to instill hope. He is still contracting for safety.  Continue 1:1 observation due to report of pt hoarding meds (with a plan to overdose). Lab: Urine toxicology is positive for benzodiazepine; indicate that patient is taking klonopin.  Pt consents to a room and body  search as well (for any contraband/meds). Continue lasix 20 mg daily with K-dur to prevent hypokalemia.   Medical Decision Making:  Established Problem,  Stable/Improving (1), Review of Psycho-Social Stressors (1), Review of Medication Regimen & Side Effects (2) and Review of New Medication or Change in Dosage (2)  Thedore Mins, MD  06/18/2014, 10:45 AM

## 2014-06-18 NOTE — Progress Notes (Addendum)
Pt is less anxious and negative today  He is still very depressed but has not made suicidal statements  Pt went to karaoke group and did fine  Pt is on 1:1 for safety  Pt received medications and will monitor for effectiveness   Verbal support and encouragement given   Safety maintained

## 2014-06-18 NOTE — Progress Notes (Signed)
[name] remains OOB UAL on the 300 hall today...tolerated well. HE remains compliant with his medications as well as his scheduled therapies, attending his groups as planned and then coloring and / or watching  TV . He is pleasant, cooperative and shows this nurse his mouth and tongue immediately after swallowing his pills, to assure  Team he has swallowed all his pills.    A HE is excited about being allowed to attend kareoke last night and this nurse encourages him to use positive feelings to propel him into more positive state of mind and heart.   R Safety is in place and 1:1 cont.

## 2014-06-18 NOTE — Progress Notes (Signed)
Pt in bed resting with eyes closed   Breathing is unlabored    Pt on a 1:1 for safety due to making suicidal statements and trying to get other pts to give him their medications    Pt safe at present

## 2014-06-18 NOTE — Progress Notes (Signed)
Patient did attend the evening karaoke group. Pt was attentive and supportive but did not sing a song.     

## 2014-06-18 NOTE — Progress Notes (Signed)
LATE ENTRY for 1:1 at 1800 Phillip [name] remains on the unit...UAL  Tolerated fair. HE interacts appropriately with his 1:1 MHT as well as the staff and the other patients. HE has a superficial smile that he uses when he asks staff to medicate him.     A He takes his meds as planned. He remains fixated on his meds and getting them exactly on schedule . HE has not demonstrated any slef harm behaviors and he is looking forward to going to Phillip [name] tonight.     R Safety in place.

## 2014-06-18 NOTE — Progress Notes (Signed)
Psychoeducational Group Note  Date:  11/04/2011 Time: 1015  Group Topic/Focus:  Identifying Needs:   The focus of this group is to help patients identify a place they could visit ( " anywhere if they could") and thereby  Introducing positive thoughts into their day.  Participation Level:  active Participation Quality: fair Affect: flat Cognitive:    Insight:  good  Engagement in Group: engaged  Additional Comments: Pt was engaged in group, asked appropriate questions, shared personal life experience with the group and demonstrates willingness to process and understand her problems. PDuke RN [facility]

## 2014-06-18 NOTE — [facility] Group Notes (Signed)
[facility] LCSW Group Therapy 06/18/2014  1:15 PM   Type of Therapy: Group Therapy  Participation Level: Did Not Attend. Patient observed in hallway with MHT, invited to participate but declined.   Samuella BruinKristin Coleen Cardiff, MSW, [employer]CSWA Clinical Social Worker [facility] [phone]

## 2014-06-18 NOTE — Progress Notes (Signed)
Pt seen in his room resting with eyes closed and unlabored breathing   Pt is on 1:1 for safety and is presently safe

## 2014-06-18 NOTE — Progress Notes (Signed)
Psychoeducational Group Note  Date:  06/18/14 Time: 0915  Group Topic/Focus : The focus  Of this  group is to help patients initiate positive thinking by imagining a place they could visit, if there were no obstacles.    Participation Level:  Passive  Participation Quality fair  ffect: flat  Cognitive:    Insight:  Limited  Engagement in Group: engaged  Additional Comments

## 2014-06-18 NOTE — Progress Notes (Signed)
Pt attended group 

## 2014-06-18 NOTE — Progress Notes (Addendum)
LATE ENTRY 1:1 for 1000   D Phillip [name] is seen OOB UAL on the 300 hall today tolerated fair. He  Is guarded. He takes his morning medications without diff and denies need for prn meds. He avoids direct eye contact  And he says " the swelling in my legs and feet is a lot better today!!!".        A  He attends his groups, is engaged in the discussion and this is evidenced by sharing his thoughts this morning in his group. HE completed his morning assessment and on it he wrote he denied having SI today. On this assessment he also wrote he rated his feelings of depression, hopelessness and anxiety " 10/ 5 / 10 " , respectively. Dr. Lenore [name] has arranged for pt to attend kareoke group tonight and plan is this will help the patient obtain a more positive outlook.       R Safety in place with 1:1 in place.

## 2014-06-18 NOTE — Clinical Social Work Note (Signed)
Yesterday evening's RN notes [facility] [place] [facility] (indicating pt HI toward provider and is on 1:1).   [employer] Sherwin-WilliamsHeather Smart, LCSWA 06/18/2014 8:42 AM

## 2014-06-19 LAB — CARBAMAZEPINE, FREE AND TOTAL
CARBAMAZEPINE FREE: 1.5 ug/mL (ref 0.6–4.2)
Carbamazepine, Total: 6.3 ug/mL (ref 4.0–12.0)

## 2014-06-19 MED ORDER — NICOTINE POLACRILEX 2 MG MT GUM
CHEWING_GUM | OROMUCOSAL | Status: AC
  Filled 2014-06-19: qty 1

## 2014-06-19 MED ORDER — NICOTINE POLACRILEX 2 MG MT GUM
2.0000 mg | CHEWING_GUM | OROMUCOSAL | Status: DC | PRN
  Administered 2014-06-19 (×2): 2 mg via ORAL
  Filled 2014-06-19: qty 1

## 2014-06-19 NOTE — Progress Notes (Signed)
1:1 1749 D Pt has been OOB UAL in the milieu all day. 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 He is more animate. His smile is much less superficial ( still superficial, but getting better...) and his eye contact is direct .    A HE has taken his medications and Ensures today without diff. He requested to go to cafe for dinner tonight. He says " come on Miss Patty...ibuprofen promise I'll be a good boy". He shared earlier this afternoon that he and the physician he saw today spoke this AM about dc'ing the 1:1 tomorrow and he feels " ok about this". This is discussed with pt and he will stay on unit to eat tonight and anticipate 1 : 1 be dc'd tomorrow, barring any complications between now and then.     R Safety is in place and poc cont.

## 2014-06-19 NOTE — Tx Team (Deleted)
Initial Interdisciplinary Treatment Plan   PATIENT STRESSORS: Medication noncompliance due to lack of finance.   PATIENT STRENGTHS: Ability for insight Average or above average intelligence Physical ability General fund of knowledge   PROBLEM LIST: Problem List/Patient Goals Date to be addressed Date deferred Reason deferred Estimated date of resolution  Substance abuse      Depression       Insomnia       "learn skills to function in real world"                                     DISCHARGE CRITERIA:  Improve stability in mood, thinking, and/or behavior Verbal commitment to aftercare and medication compliance Motivation to continue treatment in a less acute level of care.  PRELIMINARY DISCHARGE PLAN: Outpatient therapy Attend aftercare/continuing care group  PATIENT/FAMIILY INVOLVEMENT: This treatment plan has been presented to and reviewed with the patient, Phillip SailorsSteven [name], and/or family member.  The patient and family have been given the opportunity to ask questions and make suggestions.  Glenice LaineIbekwe, Kache Mcclurg B 06/19/2014, 9:48 PM

## 2014-06-19 NOTE — Tx Team (Signed)
Interdisciplinary Treatment Plan Update (Adult)   Date: 06/19/2014   Time Reviewed: 9:30AM Progress in Treatment:  Attending groups: Yes  Participating in groups:  Yes  Taking medication as prescribed: Yes  Tolerating medication: Yes  Family/Significant othe contact made: No. Pt refusing family contact for SPE or collateral information. SPE completed with pt.  Patient understands diagnosis: No-pt reports that he does not want resources "wasted" on him. Pt has clear plan to d/c, drive out of state and kill himself. [facility]  Discussing patient identified problems/goals with staff: Yes  Medical problems stabilized or resolved: Yes  Denies suicidal/homicidal ideation: SI reported. Pt is 1:1 for safety and has been unable to contract for safety.  Patient has not harmed self or Others: Yes  New problem(s) identified:  Discharge Plan or Barriers: Pt is aware that he is on [facility] wait-list. He still refuses to participate in aftercare plan. "I will shoot myself if I'm discharged. I'm not safe by myself."  Additional comments: 44 Y/o male D/C from our unit a week ago. States that when he left he went back to his GF. States that things did not work out. They broke up. States he also lost his job. States he has nothing left. States that killing himself is "the right thing to do." states he has pushed everyone away has no family left. He reports med noncompliance since [facility]. Pt reports that he is "not depressed" but rather, "at peace and relieved" because he has reportedly made up his mind to end his life. Pt states that he owns a gun and plans to shoot himself; he even admitted to the counselor that he is considering "just lying to the psychiatrists" in order to be [facility]. He says that he has nothing else to live for and that he has done everything that he wanted to do in his life. The pt appeared to be at peace with his decision and  admitted that the only fear he has about his decision to end his own life would be "going to hell"; however, the pt quickly adds, "I'm ready for it though". Though he denies any current depression, the pt reports increased depressive sx in the past 5 months, including multiple crying spells throughout the day, insomnia, social isolation and "pushing everyone away", deceased appetite, fatigue, lack of motivation, decreased concentration, inappropriate guilt, feelings of hopelessness, and severe suicidal thoughts with plan and intent. 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 He reports sleeping 1-2 hours per night on average. He states that he only eats about once per day and has lost a substantial amount of weight in recent months. In addition, the pt reports anxiety when in crowds and around other people. Pt reports that no mental health professionals can help him and that he has been in psychiatric hospitals 5 times in the past 5 months. Pt talks about how he has read "every book" on his diagnoses and the various treatments available. He reports attempting suicide at least twice in the past via OD.  3/21: Pt has been attending and participating in groups. Pleasant and calm. Pt continues to endorse high depression/high anxiety and passive SI/HI-toward doctors-pt is  on close obervation currently. Pt has contracted for safety on unit. He is aware of [facility] referral and reports that "this is probably best for me. I know I'll kill myself if I'm discharged." Pt reports that he is sleeping through the night "but the nightmares are back."   3/25: Pt pleasant and calm; attending groups. Pt is on 1:1 due to staff member overhearing him report that he plans to cheek meds in order to  overdose a few days ago. Pt currently denies SI/HI but also reported to CSW that he knows how to lie about SI in order to be released by appearing stable. Pt has bizarre affect at times but has been appropriate for the past few days on the unit.  Reason for Continuation of Hospitalization: SI Depression/mood instability Medication stabilization Estimated length of stay: unknown-pt reports SI/HI-toward Dr. Kathleen Lime. Pt on 1:1 for safety. [facility] waitlist. Verified with Junious Dresser that pt is still on waitlist. Daily progress notes have been submitted to [facility] for review.  For review of initial/current patient goals, please see plan of care.  Attendees:  Patient:    Family:    Physician: Dr. Jannifer Franklin MD  06/19/2014   Nursing: Alexia Freestone RN; [place] RN  06/19/2014   Clinical Social Worker Coreen Shippee Smart, LCSWA  06/19/2014   Other: ; Lynann Bologna 06/19/2014   Other: Darden Dates Nurse CM 06/19/2014   Other: Liliane Bade, Community Care Coordinator  06/19/2014   Other:    Scribe for Treatment Team:  Herbert Seta Smart LCSWA 06/19/2014 9:30AM

## 2014-06-19 NOTE — Progress Notes (Signed)
Pt said he did not sleep well last night  That his nightmares were worse and kept waking him up   He continues to be unable to contract for safety   Pt on 1:1 for safety  Safety maintained

## 2014-06-19 NOTE — Progress Notes (Signed)
LATE ENTRY 1:1 for 1000   D Pt observed sitting in dayroom watching TV. He appears comfortable, relaxed  And in NAD.    A HE takes his 0800 meds at 1030 and his mouth is checked for cheeking. He tells this nurse that tomorrow his " 1: 1 will stop and he smiles at this nurse and makes direct eye contact. He says ' I went to kareoke last night and it was awesome. He completes his self assessment and on it he writes he denies having SI today and he rates his depression, hopelessness and anxiety " 0/0/5", respectively.   R POC in place.

## 2014-06-19 NOTE — [facility] Group Notes (Signed)
[facility] L[facility] Note   06/19/2014 10:10 AM  Participation Quality:  Appropriate -arrive late/stating he was sleeping due to poor sleep last night.   Mood/Affect:  Appropriate  Depression Rating:  0  Anxiety Rating:  5  Thoughts of Suicide:  No Will you contract for safety?   NA  Current AVH:  No  Plan for Discharge/Comments:  Pt presents as pleasant and calm. He reports poor sleep due to nightmares last night but states that medications have been working well to stabilize his mood. Pt denies SI. States that swelling in feel has significantly decreased. Pt reports good appetite and is aware that he is still on [facility] wait-list.   Transportation Means: unknown at this time.   Supports: pt identifies noone.   Smart, American FinancialHeather LCSWA

## 2014-06-19 NOTE — [facility] Group Notes (Signed)
Adult Psychoeducational Group Note  Date:  06/19/2014 Time:  11:49 PM  Group Topic/Focus:  AA Meeting  Participation Level:  None  Participation Quality:  None  Affect:  Flat  Cognitive:  None  Insight: None  Engagement in Group:  None  Modes of Intervention:  Discussion and Education  Additional Comments:  [name] introduced himself then left.  Caroll RancherLindsay, Mykiah Schmuck A 06/19/2014, 11:49 PM

## 2014-06-19 NOTE — Progress Notes (Signed)
Recreation Therapy Notes  Date: 03.25.2016 Time: 9:30am Location: [address] Hall Group Room   Group Topic: Stress Management  Goal Area(s) Addresses:  Patient will actively participate in stress management techniques presented during session.   Behavioral Response: Engaged, Appropriate   Intervention: Art  Activity :  [place] coloring and music of patient choice.   Education:  Stress Management, Discharge Planning.   Education Outcome: Acknowledges education  Clinical Observations/Feedback: Patient attended session and participated for a short time. Following participation in activity patient engaged in conversing with male peer. Patient and peer not disruptive to group and sang along to songs in unison with peers.   Marykay Lexenise L Neldon Shepard, LRT/CTRS  Caitlynn Ju L 06/19/2014 10:21 AM

## 2014-06-19 NOTE — Progress Notes (Addendum)
LATE ENTRY for 1400      Phillip [name] remains out in the hall....talking animatedly to his 1:1 tech. HE acknowledges all staff who approach him and admits to this nurse at 1445, " I'm doing so much better.  I finally am beginning to see how this works and how I can help my self." He says eating lunch today in the cafe' went " without problems " and " it was great ".A He completes his self inventory at 1500 and on it he writes he denies SI and he rates his depression " 0 /0 / 5", respectively .     R Safety is in place. 1:1 cont.

## 2014-06-19 NOTE — Progress Notes (Signed)
1:1 D: Pt has been visible in the milieu interacting appropriately with peers and staff.  Pt has appropriate affect and pleasant mood, smiling frequently.  Pt reports he had a good visit with a friend of his.  Pt denies SI/HI, denies hallucinations.  Pt reports he hopes to be taken off of 1:1 monitoring tomorrow.  He reports he feels "fantastic."   A: Medications administered per order.  PRN medication administered for sleep, see flow sheet.  1:1 sitter is with pt for safety.   R: Pt is safe on the unit.  He verbally contracts for safety.  1:1 sitter remains with pt for safety per order.  Will continue to monitor and assess.

## 2014-06-19 NOTE — [facility] Group Notes (Signed)
[facility] LCSW Group Therapy  06/19/2014 3:29 PM  Type of Therapy:  Group Therapy  Participation Level:  Did Not Attend-pt encouraged to attend group with Alanson PulsKristin LCSWA on 400 hall. Pt chose not to attend.   Summary of Progress/Problems: Feelings around Relapse. Group members discussed the meaning of relapse and shared personal stories of relapse, how it affected them and others, and how they perceived themselves during this time. Group members were encouraged to identify triggers, warning signs and coping skills used when facing the possibility of relapse. Social supports were discussed and explored in detail.   Smart, Anniya Whiters LCSWA  06/19/2014, 3:29 PM

## 2014-06-19 NOTE — Progress Notes (Signed)
Pt in bed resting with eyes closed  Pt is snoring but breathing is unlabored and regular   Pt is on 1:1 for safety  Safety maintained

## 2014-06-19 NOTE — Progress Notes (Signed)
[name], male   DOB: May 22, 1970, 44 y.o.   MRN: [id number]  [facility] MD Progress Note  06/19/2014 11:43 AM [name]  MRN:  [id number]   Subjective: I have nightmares, feels hopeless, anxious, depressed and suicidal.''   Objective:  Patient seen and chart reviewed. He is  a veteran of [place] war; operation desert storm who is currently not service connected with the [place]. He is reporting ongoing depressive symptoms, anhedonia, hopelessness, worthlessness, lack of motivation, chronic low energy level, nightmares and chronic sense of forshorten future. However, he reports that he has been sleeping better due to decreased weird dreams and worries. He continues to verbalize recurrent suicidal thoughts which is worse at night but he is able to contract for safety on unit. He has not exhibited any self injurious behavior since admission but continues to report feeling unsafe at his home. Patient denies psychosis or delusional thinking or adverse reactions to his current medications. He developed bilateral peripheral edema on Mellaril which has been subsiding.   Principal Problem: PTSD (post-traumatic stress disorder) Diagnosis:   Patient Active Problem List   Diagnosis Date Noted  . Suicidal ideations [R45.851] 06/05/2014    Priority: High  . MDD (major depressive disorder), recurrent, severe, with psychosis [F33.3] 05/18/2014    Priority: High  . PTSD (post-traumatic stress disorder) [F43.10] 01/29/2014    Priority: High  . Generalized anxiety disorder [F41.1] 06/19/2011    Priority: High    Class: Chronic  . Major depression [F32.2] 06/06/2014  . Panic disorder [F41.0] 05/19/2014   Total Time spent with patient: 30 minutes   Past Medical History:  Past Medical History  Diagnosis Date  . Anxiety   . Depression   . PTSD (post-traumatic stress disorder)     Past Surgical History  Procedure Laterality Date  . Cholesterol     Family History:  Family History  Problem  Relation Age of Onset  . Cancer Mother   . Cancer Father   . Depression Father    Social History:  History  Alcohol Use No     History  Drug Use No    Comment: denies    History   Social History  . Marital Status: Married    Spouse Name: N/A  . Number of Children: N/A  . Years of Education: N/A   Social History Main Topics  . Smoking status: Current Every Day Smoker -- 1.00 packs/day for 19 years    Types: Cigarettes  . Smokeless tobacco: Not on file  . Alcohol Use: No  . Drug Use: No     Comment: denies  . Sexual Activity: Yes    Birth Control/ Protection: None   Other Topics Concern  . None   Social History Narrative   Additional History:    Sleep:  Fair (waking up often last night to urinate, due to lasix), chart indicates slept 6.75 hours  Appetite:  Fair  Assessment:   Musculoskeletal: Strength & Muscle Tone: within normal limits Gait & Station: normal Patient leans: N/A   Psychiatric Specialty Exam: Physical Exam  Vitals reviewed. Psychiatric: His speech is normal. His mood appears anxious. He is slowed and withdrawn. Cognition and memory are normal. He expresses impulsivity. He exhibits a depressed mood. He expresses suicidal ideation.    ROS  Blood pressure 132/102, pulse 108, temperature 97.8 F (36.6 C), temperature source Oral, resp. rate 18, height 5' 10.5" (1.791 m), weight 82.555 kg (182 lb), SpO2 99 %.Body mass index is 25.74  kg/(m^2).  General Appearance: Fairly Groomed  Patent attorney::  Good  Speech:  Clear and Coherent  Volume:  Normal  Mood:  Depressed  Affect:  Restricted, superficial   Thought Process:  Coherent and Goal Directed  Orientation:  Full (Time, Place, and Person)  Thought Content:  No hallucinations, no delusions, not internally preoccupied  Suicidal Thoughts:  Yes.  with intent/plan (shooting self with gun or overdosing on pills)- able to contract for safety on the unit, since he does not have any means to hurt himself  here   Homicidal Thoughts:  No, but he does not want to see Dr. Jama Flavors again (because he would want to hurt him, for decreasing his ambien dose)  Memory:  Remote and recent grossly intact  Judgement:  Poor  Insight:  Shallow  Psychomotor Activity:  Decreased  Concentration:  Good  Recall:  Good  Fund of Knowledge:Good  Language: Good  Akathisia:  No  Handed:  Right  AIMS (if indicated):     Assets:  Communication Skills Desire for Improvement Resilience  ADL's:  Intact  Cognition: WNL  Sleep:  Number of Hours: 4.75   Current Medications: Current Facility-Administered Medications  Medication Dose Route Frequency Provider Last Rate Last Dose  . acetaminophen (TYLENOL) tablet 650 mg  650 mg Oral Q4H PRN Kristeen Mans, NP      . alum & mag hydroxide-simeth (MAALOX/MYLANTA) 200-200-20 MG/5ML suspension 30 mL  30 mL Oral Q4H PRN Rachael Fee, MD   30 mL at 06/10/14 1651  . ARIPiprazole (ABILIFY) tablet 7.5 mg  7.5 mg Oral QHS Caprice Kluver, MD   7.5 mg at 06/18/14 2001  . haloperidol (HALDOL) tablet 5 mg  5 mg Oral Q6H PRN Kerry Hough, PA-C   5 mg at 06/18/14 2143   And  . benztropine (COGENTIN) tablet 1 mg  1 mg Oral Q6H PRN Kerry Hough, PA-C   1 mg at 06/18/14 2143  . carbamazepine (EQUETRO) 12 hr capsule 200 mg  200 mg Oral BID Rachael Fee, MD   200 mg at 06/19/14 1031  . chlorproMAZINE (THORAZINE) tablet 100 mg  100 mg Oral QHS Caprice Kluver, MD   100 mg at 06/18/14 2001  . clonazePAM (KLONOPIN) tablet 1 mg  1 mg Oral QID Akari Defelice   1 mg at 06/19/14 0542  . feeding supplement (ENSURE COMPLETE) (ENSURE COMPLETE) liquid 237 mL  237 mL Oral BID BM Rachael Fee, MD   237 mL at 06/17/14 1419  . furosemide (LASIX) tablet 20 mg  20 mg Oral Daily Thermon Leyland, NP   20 mg at 06/19/14 0800  . gabapentin (NEURONTIN) capsule 300 mg  300 mg Oral TID Rachael Fee, MD   300 mg at 06/19/14 1030  . hydrOXYzine (ATARAX/VISTARIL) tablet 25 mg  25 mg Oral Q6H PRN Worthy Flank,  NP   25 mg at 06/18/14 2143  . magnesium hydroxide (MILK OF MAGNESIA) suspension 30 mL  30 mL Oral Daily PRN Rachael Fee, MD      . nicotine (NICODERM CQ - dosed in mg/24 hours) patch 21 mg  21 mg Transdermal Daily Kristeen Mans, NP   21 mg at 06/19/14 0651  . pantoprazole (PROTONIX) EC tablet 20 mg  20 mg Oral Daily Adonis Brook, NP   20 mg at 06/19/14 1031  . potassium chloride (K-DUR,KLOR-CON) CR tablet 10 mEq  10 mEq Oral Daily Thermon Leyland, NP  10 mEq at 06/19/14 1030  . prazosin (MINIPRESS) capsule 3 mg  3 mg Oral QHS Caprice Kluver, MD   3 mg at 06/18/14 2001  . testosterone cypionate (DEPOTESTOTERONE CYPIONATE) injection 100 mg  100 mg Intramuscular Q14 Days Rachael Fee, MD   100 mg at 06/09/14 1537  . zolpidem (AMBIEN) tablet 10 mg  10 mg Oral QHS PRN Caprice Kluver, MD   10 mg at 06/18/14 2143    Lab Results:  Results for orders placed or performed during the hospital encounter of 06/06/14 (from the past 48 hour(s))  Urine rapid drug screen (hosp performed)     Status: Abnormal   Collection Time: 06/17/14  2:00 PM  Result Value Ref Range   Opiates NONE DETECTED NONE DETECTED   Cocaine NONE DETECTED NONE DETECTED   Benzodiazepines POSITIVE (A) NONE DETECTED   Amphetamines NONE DETECTED NONE DETECTED   Tetrahydrocannabinol NONE DETECTED NONE DETECTED   Barbiturates NONE DETECTED NONE DETECTED    Comment:        DRUG SCREEN FOR MEDICAL PURPOSES ONLY.  IF CONFIRMATION IS NEEDED FOR ANY PURPOSE, NOTIFY LAB WITHIN 5 DAYS.        LOWEST DETECTABLE LIMITS FOR URINE DRUG SCREEN Drug Class       Cutoff (ng/mL) Amphetamine      1000 Barbiturate      200 Benzodiazepine   200 Tricyclics       300 Opiates          300 Cocaine          300 THC              50 Performed at [facility]     Physical Findings: AIMS: Facial and Oral Movements Muscles of Facial Expression: None, normal Lips and Perioral Area: None, normal Jaw: None, normal Tongue: None,  normal,Extremity Movements Upper (arms, wrists, hands, fingers): None, normal Lower (legs, knees, ankles, toes): None, normal, Trunk Movements Neck, shoulders, hips: None, normal, Overall Severity Severity of abnormal movements (highest score from questions above): None, normal Incapacitation due to abnormal movements: None, normal Patient's awareness of abnormal movements (rate only patient's report): No Awareness, Dental Status Current problems with teeth and/or dentures?: No Does patient usually wear dentures?: No  CIWA:  CIWA-Ar Total: 1 COWS:  COWS Total Score: 3   Assessment- patient is depressed, has chronic suicidal ideations, but is able to contract for safety on unit.Pt started having bilateral lower extremity edema 3 days ago, which is starting to resolve with diuretic.  Treatment Plan Summary: Daily contact with patient to assess and evaluate symptoms and progress in treatment, Medication management, Plan continue inpatient treatment  and medications as below    As discussed with staff, patient currently on waiting list for [facility]. Medication management: Will continue Abilify 7.5 mgrs QDAY  Carbamazepine 200 mgrs BID Klonopin 1 mgr QID Neurontin 300 mgrs TID Ambien 10 mgrs QHS PRN Insomnia Thorazine 100 mg QHS for insomnia/mood. Continue Minipress 3 mgrs QHS Suicidal ideas; continue to work to instill hope. He is still contracting for safety.  Continue 1:1 observation due to report of pt hoarding meds (with a plan to overdose). Lab: Urine toxicology is positive for benzodiazepine; indicate that patient is taking klonopin.  Pt consents to a room and body search as well (for any contraband/meds). Continue lasix 20 mg daily with K-dur to prevent hypokalemia.   Medical Decision Making:  Established Problem, Improving (1), Review of Psycho-Social Stressors (1), Review  of Medication Regimen & Side Effects (2) and Review of New Medication or Change in Dosage (2)  Thedore MinsAkintayo, Derrien Anschutz,  MD  06/19/2014, 11:43 AM

## 2014-06-20 LAB — TSH: TSH: 3.611 u[IU]/mL (ref 0.350–4.500)

## 2014-06-20 LAB — GLUCOSE, CAPILLARY: GLUCOSE-CAPILLARY: 132 mg/dL — AB (ref 70–99)

## 2014-06-20 MED ORDER — NICOTINE 21 MG/24HR TD PT24
21.0000 mg | MEDICATED_PATCH | Freq: Every day | TRANSDERMAL | Status: DC
  Administered 2014-06-20 – 2014-06-23 (×4): 21 mg via TRANSDERMAL
  Filled 2014-06-20 (×5): qty 1

## 2014-06-20 NOTE — Plan of Care (Signed)
Problem: Diagnosis: Increased Risk For Suicide Attempt Goal: LTG-Patient Will Report Improved Mood and Deny Suicidal LTG (by discharge) Patient will report improved mood and deny suicidal ideation.  Outcome: Progressing Patient reports improved mood today and states his mood is "great" this morning. Pt denies having any suicidal thoughts today. Goal: STG-Patient Will Attend All Groups On The Unit Outcome: Progressing Patient has attended all unit groups today.  Goal: STG-Patient Will Comply With Medication Regime Outcome: Progressing Patient has adhered to medication regimen today with ease.

## 2014-06-20 NOTE — Progress Notes (Signed)
D: Pt has appeared very happy while he has been awake during the shift.  He smiles frequently although at times he seems superficially happy.  Per previous notes, pt has reported that he will say what [facility] needs to [facility]  Per notes, pt also has a gun and had a plan to shoot himself.  Pt denies SI this shift.  He denies HI, denies hallucinations.  When asked if he'd contract for safety, pt reported he would "definitely" let staff know of any thoughts of hurting himself or others prior to acting on them.  Pt chose not to attend evening group last night and instead talked with peer in the hallway.  Pt is focused on being taken off of 1:1 monitoring.  When asked about aftercare plans, pt reported "now I know I need to take my meds, I need to talk to a therapist."  Pt reported he "applied for my [employer] benefits."  Pt has not slept much throughout the night.  He woke up and showered and has been reading in his room.   A: Pt is monitored by staff continuously per order.  Encouraged and supported pt.   R: Safety maintained.  Will continue to monitor and assess.

## 2014-06-20 NOTE — Progress Notes (Signed)
[facility] Post 1:1 Observation Documentation  For the first (8) hours following discontinuation of 1:1 precautions, a progress note entry by nursing staff should be documented at least every 2 hours, reflecting the patient's behavior, condition, mood, and conversation.  Use the progress notes for additional entries.  Time 1:1 discontinued:  1431pm per PA Bernerd LimboNeil M.  Patient's Behavior:  Pt is lying supine in bed. Pt is heard snoring.  Patient's Condition:  Pt appears to be sleeping and is observed to be in no acute distress.  Patient's Conversation:  No conversation, pt appears to be asleep.  Phillip [name], Phillip [name] A 06/20/2014, 6:42 PM

## 2014-06-20 NOTE — Progress Notes (Signed)
[name], male   DOB: [birth year], 44 y.o.   MRN: [id number] [name], male   DOB: 31-May-1970, 44 y.o.   MRN: [id number]  [facility] MD Progress Note  06/20/2014 2:05 PM Loic Hobin  MRN:  [id number]   Subjective: [name] is seen and the chart is reviewed. He states he is feeling much better today and he is concerned about the swelling in his feet. He denies pain but notes that it is in both feet. He is concerned about the medications.  He denies SI/HIAVH.  He would like to be off the 1:1 and feels that he could contact staff if he were to become agitated or feels that he would escalate. States he feels like he has the flu, but has had no fevers or body aches. States his skin feels hot.   Objective: Patient is A&O x 3, speech is clear and goal directed. He is in full contact with reality. Affect is sad, and in direct opposition to his reports of feeling the best ever. His voice is low and a bit monotone. Both lower extremities show edema, minimal erythema.  Principal Problem: PTSD (post-traumatic stress disorder) Diagnosis:   Patient Active Problem List   Diagnosis Date Noted  . Major depression [F32.2] 06/06/2014  . Suicidal ideations [R45.851] 06/05/2014  . Panic disorder [F41.0] 05/19/2014  . MDD (major depressive disorder), recurrent, severe, with psychosis [F33.3] 05/18/2014  . PTSD (post-traumatic stress disorder) [F43.10] 01/29/2014  . Generalized anxiety disorder [F41.1] 06/19/2011    Class: Chronic   Total Time spent with patient: 30 minutes   Past Medical History:  Past Medical History  Diagnosis Date  . Anxiety   . Depression   . PTSD (post-traumatic stress disorder)     Past Surgical History  Procedure Laterality Date  . Cholesterol     Family History:  Family History  Problem Relation Age of Onset  . Cancer Mother   . Cancer Father   . Depression Father    Social History:  History  Alcohol Use No     History  Drug Use No   Comment: denies    History   Social History  . Marital Status: Married    Spouse Name: N/A  . Number of Children: N/A  . Years of Education: N/A   Social History Main Topics  . Smoking status: Current Every Day Smoker -- 1.00 packs/day for 19 years    Types: Cigarettes  . Smokeless tobacco: Not on file  . Alcohol Use: No  . Drug Use: No     Comment: denies  . Sexual Activity: Yes    Birth Control/ Protection: None   Other Topics Concern  . None   Social History Narrative   Additional History:    Sleep:  Fair (waking up often last night to urinate, due to lasix), chart indicates slept 6.75 hours  Appetite:  Fair  Assessment:   Musculoskeletal: Strength & Muscle Tone: within normal limits Gait & Station: normal Patient leans: N/A   Psychiatric Specialty Exam: Physical Exam  Vitals reviewed. Psychiatric: His speech is normal. His mood appears anxious. He is slowed and withdrawn. Cognition and memory are normal. He expresses impulsivity. He exhibits a depressed mood. He expresses suicidal ideation.    ROS  Blood pressure 146/87, pulse 92, temperature 98.5 F (36.9 C), temperature source Oral, resp. rate 18, height 5' 10.5" (1.791 m), weight 82.555 kg (182 lb), SpO2 99 %.Body mass index is 25.74 kg/(m^2).  General  Appearance: Fairly Groomed  Patent attorney::  Good  Speech:  Clear and Coherent  Volume:  Normal  Mood:  Depressed  Affect:  Restricted, superficial   Thought Process:  Coherent and Goal Directed  Orientation:  Full (Time, Place, and Person)  Thought Content:  No hallucinations, no delusions, not internally preoccupied  Suicidal Thoughts:  Yes.  with intent/plan (shooting self with gun or overdosing on pills)- able to contract for safety on the unit, since he does not have any means to hurt himself here   Homicidal Thoughts:  No, but he does not want to see Dr. Jama Flavors again (because he would want to hurt him, for decreasing his ambien dose)  Memory:  Remote  and recent grossly intact  Judgement:  Poor  Insight:  Shallow  Psychomotor Activity:  Decreased  Concentration:  Good  Recall:  Good  Fund of Knowledge:Good  Language: Good  Akathisia:  No  Handed:  Right  AIMS (if indicated):     Assets:  Communication Skills Desire for Improvement Resilience  ADL's:  Intact  Cognition: WNL  Sleep:  Number of Hours: 4   Current Medications: Current Facility-Administered Medications  Medication Dose Route Frequency Provider Last Rate Last Dose  . acetaminophen (TYLENOL) tablet 650 mg  650 mg Oral Q4H PRN Kristeen Mans, NP      . alum & mag hydroxide-simeth (MAALOX/MYLANTA) 200-200-20 MG/5ML suspension 30 mL  30 mL Oral Q4H PRN Rachael Fee, MD   30 mL at 06/10/14 1651  . ARIPiprazole (ABILIFY) tablet 7.5 mg  7.5 mg Oral QHS Caprice Kluver, MD   7.5 mg at 06/19/14 2005  . haloperidol (HALDOL) tablet 5 mg  5 mg Oral Q6H PRN Kerry Hough, PA-C   5 mg at 06/18/14 2143   And  . benztropine (COGENTIN) tablet 1 mg  1 mg Oral Q6H PRN Kerry Hough, PA-C   1 mg at 06/18/14 2143  . carbamazepine (EQUETRO) 12 hr capsule 200 mg  200 mg Oral BID Rachael Fee, MD   200 mg at 06/20/14 0747  . chlorproMAZINE (THORAZINE) tablet 100 mg  100 mg Oral QHS Caprice Kluver, MD   100 mg at 06/19/14 2000  . clonazePAM (KLONOPIN) tablet 1 mg  1 mg Oral QID Mojeed Akintayo   1 mg at 06/20/14 1042  . feeding supplement (ENSURE COMPLETE) (ENSURE COMPLETE) liquid 237 mL  237 mL Oral BID BM Rachael Fee, MD   237 mL at 06/19/14 1454  . furosemide (LASIX) tablet 20 mg  20 mg Oral Daily Thermon Leyland, NP   20 mg at 06/20/14 0747  . gabapentin (NEURONTIN) capsule 300 mg  300 mg Oral TID Rachael Fee, MD   300 mg at 06/20/14 1144  . hydrOXYzine (ATARAX/VISTARIL) tablet 25 mg  25 mg Oral Q6H PRN Worthy Flank, NP   25 mg at 06/20/14 0847  . magnesium hydroxide (MILK OF MAGNESIA) suspension 30 mL  30 mL Oral Daily PRN Rachael Fee, MD      . nicotine (NICODERM CQ - dosed  in mg/24 hours) patch 21 mg  21 mg Transdermal Daily Worthy Flank, NP   21 mg at 06/20/14 0747  . pantoprazole (PROTONIX) EC tablet 20 mg  20 mg Oral Daily Adonis Brook, NP   20 mg at 06/20/14 0747  . potassium chloride (K-DUR,KLOR-CON) CR tablet 10 mEq  10 mEq Oral Daily Thermon Leyland, NP   10 mEq  at 06/20/14 0747  . prazosin (MINIPRESS) capsule 3 mg  3 mg Oral QHS Caprice KluverVinay P Saranga, MD   3 mg at 06/19/14 2000  . testosterone cypionate (DEPOTESTOTERONE CYPIONATE) injection 100 mg  100 mg Intramuscular Q14 Days Rachael FeeIrving A Lugo, MD   100 mg at 06/09/14 1537  . zolpidem (AMBIEN) tablet 10 mg  10 mg Oral QHS PRN Caprice KluverVinay P Saranga, MD   10 mg at 06/19/14 2004    Lab Results:  No results found for this or any previous visit (from the past 48 hour(s)).  Physical Findings: AIMS: Facial and Oral Movements Muscles of Facial Expression: None, normal Lips and Perioral Area: None, normal Jaw: None, normal Tongue: None, normal,Extremity Movements Upper (arms, wrists, hands, fingers): None, normal Lower (legs, knees, ankles, toes): None, normal, Trunk Movements Neck, shoulders, hips: None, normal, Overall Severity Severity of abnormal movements (highest score from questions above): None, normal Incapacitation due to abnormal movements: None, normal Patient's awareness of abnormal movements (rate only patient's report): No Awareness, Dental Status Current problems with teeth and/or dentures?: No Does patient usually wear dentures?: No  CIWA:  CIWA-Ar Total: 1 COWS:  COWS Total Score: 3   Assessment- patient is depressed, has chronic suicidal ideations, but is able to contract for safety on unit.Pt started having bilateral lower extremity edema 3 days ago, which is starting to resolve with diuretic.  Treatment Plan Summary: Daily contact with patient to assess and evaluate symptoms and progress in treatment, Medication management, Plan continue inpatient treatment  and medications as below   1. Will  D/C 1:1. 2. Patient to rest with his feet above his heart. 3. All medications are reviewed for adverse side effects. 4. Will order TSH to rule out acquired hypothyroidism.     As discussed with staff, patient currently on waiting list for [facility]. Medication management: Will continue Abilify 7.5 mgrs QDAY  Carbamazepine 200 mgrs BID Klonopin 1 mgr QID Neurontin 300 mgrs TID Ambien 10 mgrs QHS PRN Insomnia Thorazine 100 mg QHS for insomnia/mood. Continue Minipress 3 mgrs QHS Suicidal ideas; continue to work to instill hope. He is still contracting for safety.  Continue 1:1 observation due to report of pt hoarding meds (with a plan to overdose). Lab: Urine toxicology is positive for benzodiazepine; indicate that patient is taking klonopin.  Pt consents to a room and body search as well (for any contraband/meds). Continue lasix 20 mg daily with K-dur to prevent hypokalemia.   No medication changes at this time, but would consider not using the testosterone due to the possibility of edema, CHF. Would also consider avoiding two antipsychotics if possible.   Medical Decision Making:  Established Problem, Improving (1), Review of Psycho-Social Stressors (1), Review of Medication Regimen & Side Effects (2) and Review of New Medication or Change in Dosage (2) Rona RavensNeil T. Mashburn RPAC 2:47 PM 06/20/2014   Agree with progress note, as above Nehemiah MassedFernando Cobos, MD

## 2014-06-20 NOTE — Progress Notes (Signed)
1:1 OBSERVATION NOTE: D: Patient is ambulating the halls at this time and states "I'm trying to walk off some of these calories." [facility]. Pt denies having any other concerns at this time. A: 1:1 status maintained per providers orders, MHT with pt. 15 minute checks continued per protocol for patient safety.  R: Patient cooperative and receptive to nursing interventions. Pt remains safe.

## 2014-06-20 NOTE — Progress Notes (Signed)
1:1 OBSERVATION NOTE: D: Patient is observed in the dayroom at this time. Pt complains of swelling in his feet. A: Pt remains on 1:1 status per providers orders. Pt encouraged to elevate feet. PA, Lloyd Hugereil made aware of pt's complaints. 15 minute checks continued per protocol for patient safety.  R: Pt remains safe.

## 2014-06-20 NOTE — Progress Notes (Signed)
D: Patient is alert and oriented. Pt's mood and affect is "great," pleasant, anxious, animated at times, and superficial. Pt is flirtatious at times. Pt denies SI/HI and AVH. Pt denies depression and hopelessness. Pt rates anxiety 5/10. Pt reports he is ready to come off his 1:1 status today. Pt is attending unit groups. Pt complains of edema today, LLE mild pitting, RLE non-pitting, pt complains of dry feet, pt complains of feeling "flushed" this afternoon and requests CBG, 132. Pt has been active in the milieu today and is interacting with other pt's. A: 1:1 status maintained per providers orders until order D/C'd at 1431. Encouragement/Support provided to pt. Active listening by RN. CBG obtained per pt request. Pt encouraged to elevate feet, lotion given for dry skin, pt aware of new orders regarding elevating feet above heart level and D/C 1:1. PRN medication administered for anxiety per providers orders (See MAR). Scheduled medications administered per providers orders (See MAR). 15 minute checks continued per protocol for patient safety.  R: Patient cooperative and receptive to nursing interventions. Pt remains safe.

## 2014-06-20 NOTE — Progress Notes (Signed)
Pt was found by MHT during 15 min checks with a male patient in his bed   They were both fully clothed  Both apologized and the male went to her room   [facility]

## 2014-06-20 NOTE — Progress Notes (Addendum)
POST 1:1 NOTE: D: Pt made of aware of D/C 1:1 order, pt denies concerns at this time. Pt ambulatory in the hallway. A: Pt removed from 1:1 status at 1431 per providers orders. 15 minute checks continued per protocol for patient safety.  R: Pt verbally contracts for safety. Pt agrees to come to staff if he feels unsafe. Pt remains safe.

## 2014-06-20 NOTE — Progress Notes (Signed)
[facility] Post 1:1 Observation Documentation  For the first (8) hours following discontinuation of 1:1 precautions, a progress note entry by nursing staff should be documented at least every 2 hours, reflecting the patient's behavior, condition, mood, and conversation.  Use the progress notes for additional entries.  Time 1:1 discontinued:  1421  Patient's Behavior:  Pt behavior is appropriate   He is pleasant on approach and interacts well with others  Patient's Condition:  Pt is stable  Patient's Conversation:  Pt is logical and coherent in speech   Orlene ErmConversation is appropriate  Phillip [name], Phillip [name] 06/20/2014, 10:13 PM

## 2014-06-20 NOTE — Progress Notes (Signed)
[facility] Post 1:1 Observation Documentation  For the first (8) hours following discontinuation of 1:1 precautions, a progress note entry by nursing staff should be documented at least every 2 hours, reflecting the patient's behavior, condition, mood, and conversation.  Use the progress notes for additional entries.  Time 1:1 discontinued:  1431  Patient's Behavior:  Visible on the milieu  Interacting with others   pleasant and cooperative  Patient's Condition:  Pt is stable  Patient's Conversation:  Verbalizations are appropriate   Logical and coherent  Andrena Mewsuttall, Oluwanifemi Petitti J 06/20/2014, 8:57 PM

## 2014-06-20 NOTE — Progress Notes (Signed)
POST 1:1 NOTE: D: Patient is in dayroom interacting with other pts. Pt is observed elevating feet. Pt denies having any concerns at this time. Pt reports he feels safe and well off of 1:1 at this time. A: Pt asked if he has any concerns/complaints. 15 minute checks continued per protocol for patient safety.  R: Pt remains safe.

## 2014-06-20 NOTE — [facility] Group Notes (Signed)
[facility] Group Notes:  (Clinical Social Work)  06/20/2014     2:30-3:30  Summary of Progress/Problems:   The main focus of today's process group was to learn how to use a decisional balance exercise to move forward in the Stages of Change, which were described and discussed.  Motivational Interviewing and a worksheet were utilized to help patients explore in depth the perceived benefits and costs of a self-sabotaging behavior, as well as the  benefits and costs of replacing that with a healthy coping mechanism.   The patient was only present for part of group, participated, but then left.  Type of Therapy:  Group Therapy - Process   Participation Level:  Minimal  Participation Quality:  Attentive  Affect:  Flat  Cognitive:  Alert  Insight:  Limited  Engagement in Therapy:  Lacking and Limited  Modes of Intervention:  Education, Motivational Interviewing  Ambrose MantleMareida Grossman-Orr, LCSW 06/20/2014, 4:41 PM

## 2014-06-20 NOTE — [facility] Group Notes (Signed)
[facility] Group Notes:  (Nursing/MHT/Case Management/Adjunct)  Date:  06/20/2014  Time:  0900am  Type of Therapy:  Nurse Education  Participation Level:  Active  Participation Quality:  Appropriate and Attentive  Affect:  Appropriate  Cognitive:  Alert and Appropriate  Insight:  Appropriate  Engagement in Group:  Engaged  Modes of Intervention:  Discussion, Education and Support  Summary of Progress/Problems: Patient attended group, remained engaged, and responded appropriately when prompted. Pt states his healthy coping skills are "fishing, camping, washing my truck, and listening to music."  Laurine BlazerGuthrie, [place]adaBrittany A 06/20/2014, 10:38 AM

## 2014-06-20 NOTE — Progress Notes (Signed)
1:1 D: Pt is resting in his room with eyes closed.  Respirations are even and unlabored.  No distress noted. A: Sitter is monitoring pt continuously per order.  R: Safety maintained.

## 2014-06-20 NOTE — Progress Notes (Signed)
Patient did not attend the evening speaker AA meeting. Pt did enter meeting but remained for less than 10 minutes.

## 2014-06-21 NOTE — Progress Notes (Signed)
[name], male   DOB: 1970-06-07, 44 y.o.   MRN: [id number]  [facility] MD Progress Note  06/21/2014 12:38 PM Phillip [name]  MRN:  [id number]   Subjective: Patient states "we didn't do anything at all." He states he is taking full responsibility for his actions and hopes it won't happen again. He denies SI/HI/AVI. He is motivated to see his attorney tomorrow and interview with the [place].   Objective: The patient has been up and active in the unit milieu. Last night he was found on a 15 minute check with a male patient in his bed. Fully clothed.  Labs are reviewed and his TSH is normal. Today he is optimist smiling, and states he is ready for therapy.  His swelling is down in his feet.     Principal Problem: PTSD (post-traumatic stress disorder) Diagnosis:   Patient Active Problem List   Diagnosis Date Noted  . Major depression [F32.2] 06/06/2014  . Suicidal ideations [R45.851] 06/05/2014  . Panic disorder [F41.0] 05/19/2014  . MDD (major depressive disorder), recurrent, severe, with psychosis [F33.3] 05/18/2014  . PTSD (post-traumatic stress disorder) [F43.10] 01/29/2014  . Generalized anxiety disorder [F41.1] 06/19/2011    Class: Chronic   Total Time spent with patient: 30 minutes   Past Medical History:  Past Medical History  Diagnosis Date  . Anxiety   . Depression   . PTSD (post-traumatic stress disorder)     Past Surgical History  Procedure Laterality Date  . Cholesterol     Family History:  Family History  Problem Relation Age of Onset  . Cancer Mother   . Cancer Father   . Depression Father    Social History:  History  Alcohol Use No     History  Drug Use No    Comment: denies    History   Social History  . Marital Status: Married    Spouse Name: N/A  . Number of Children: N/A  . Years of Education: N/A   Social History Main Topics  . Smoking status: Current Every Day Smoker -- 1.00 packs/day for 19 years    Types: Cigarettes  . Smokeless  tobacco: Not on file  . Alcohol Use: No  . Drug Use: No     Comment: denies  . Sexual Activity: Yes    Birth Control/ Protection: None   Other Topics Concern  . None   Social History Narrative   Additional History:    Sleep:  Fair (waking up often last night to urinate, due to lasix), chart indicates slept 6.75 hours  Appetite:  Fair  Assessment:   Musculoskeletal: Strength & Muscle Tone: within normal limits Gait & Station: normal Patient leans: N/A   Psychiatric Specialty Exam: Physical Exam  Vitals reviewed. Psychiatric: His speech is normal. His mood appears anxious. He is slowed and withdrawn. Cognition and memory are normal. He expresses impulsivity. He exhibits a depressed mood. He expresses suicidal ideation.    ROS  Blood pressure 147/92, pulse 115, temperature 97.4 F (36.3 C), temperature source Oral, resp. rate 20, height 5' 10.5" (1.791 m), weight 82.555 kg (182 lb), SpO2 99 %.Body mass index is 25.74 kg/(m^2).  General Appearance: Fairly Groomed  Patent attorney::  Good  Speech:  Clear and Coherent  Volume:  Normal  Mood:  Depressed  Affect:  Restricted, superficial   Thought Process:  Coherent and Goal Directed  Orientation:  Full (Time, Place, and Person)  Thought Content:  No hallucinations, no delusions, not internally preoccupied  Suicidal Thoughts:  Yes.  with intent/plan (shooting self with gun or overdosing on pills)- able to contract for safety on the unit, since he does not have any means to hurt himself here   Homicidal Thoughts:  No, but he does not want to see Dr. Jama Flavorsobos again (because he would want to hurt him, for decreasing his ambien dose)  Memory:  Remote and recent grossly intact  Judgement:  Poor  Insight:  Shallow  Psychomotor Activity:  Decreased  Concentration:  Good  Recall:  Good  Fund of Knowledge:Good  Language: Good  Akathisia:  No  Handed:  Right  AIMS (if indicated):     Assets:  Communication Skills Desire for  Improvement Resilience  ADL's:  Intact  Cognition: WNL  Sleep:  Number of Hours: 6   Current Medications: Current Facility-Administered Medications  Medication Dose Route Frequency Provider Last Rate Last Dose  . acetaminophen (TYLENOL) tablet 650 mg  650 mg Oral Q4H PRN Kristeen MansFran E Hobson, NP      . alum & mag hydroxide-simeth (MAALOX/MYLANTA) 200-200-20 MG/5ML suspension 30 mL  30 mL Oral Q4H PRN Rachael FeeIrving A Lugo, MD   30 mL at 06/10/14 1651  . ARIPiprazole (ABILIFY) tablet 7.5 mg  7.5 mg Oral QHS Caprice KluverVinay P Saranga, MD   7.5 mg at 06/20/14 2006  . haloperidol (HALDOL) tablet 5 mg  5 mg Oral Q6H PRN Kerry HoughSpencer E Simon, PA-C   5 mg at 06/18/14 2143   And  . benztropine (COGENTIN) tablet 1 mg  1 mg Oral Q6H PRN Kerry HoughSpencer E Simon, PA-C   1 mg at 06/18/14 2143  . carbamazepine (EQUETRO) 12 hr capsule 200 mg  200 mg Oral BID Rachael FeeIrving A Lugo, MD   200 mg at 06/21/14 [id number]  . chlorproMAZINE (THORAZINE) tablet 100 mg  100 mg Oral QHS Caprice KluverVinay P Saranga, MD   100 mg at 06/20/14 2006  . clonazePAM (KLONOPIN) tablet 1 mg  1 mg Oral QID Mojeed Akintayo   1 mg at 06/21/14 1154  . feeding supplement (ENSURE COMPLETE) (ENSURE COMPLETE) liquid 237 mL  237 mL Oral BID BM Rachael FeeIrving A Lugo, MD   237 mL at 06/21/14 1152  . furosemide (LASIX) tablet 20 mg  20 mg Oral Daily Thermon LeylandLaura A Davis, NP   20 mg at 06/21/14 0905  . gabapentin (NEURONTIN) capsule 300 mg  300 mg Oral TID Rachael FeeIrving A Lugo, MD   300 mg at 06/21/14 1156  . hydrOXYzine (ATARAX/VISTARIL) tablet 25 mg  25 mg Oral Q6H PRN Worthy FlankIjeoma E Nwaeze, NP   25 mg at 06/20/14 0847  . magnesium hydroxide (MILK OF MAGNESIA) suspension 30 mL  30 mL Oral Daily PRN Rachael FeeIrving A Lugo, MD      . nicotine (NICODERM CQ - dosed in mg/24 hours) patch 21 mg  21 mg Transdermal Daily Worthy FlankIjeoma E Nwaeze, NP   21 mg at 06/21/14 0907  . pantoprazole (PROTONIX) EC tablet 20 mg  20 mg Oral Daily Adonis BrookSheila Agustin, NP   20 mg at 06/21/14 0906  . potassium chloride (K-DUR,KLOR-CON) CR tablet 10 mEq  10 mEq Oral Daily  Thermon LeylandLaura A Davis, NP   10 mEq at 06/21/14 [id number]  . prazosin (MINIPRESS) capsule 3 mg  3 mg Oral QHS Caprice KluverVinay P Saranga, MD   3 mg at 06/20/14 2006  . testosterone cypionate (DEPOTESTOTERONE CYPIONATE) injection 100 mg  100 mg Intramuscular Q14 Days Rachael FeeIrving A Lugo, MD   100 mg at 06/09/14 1537  . zolpidem (AMBIEN)  tablet 10 mg  10 mg Oral QHS PRN Caprice Kluver, MD   10 mg at 06/20/14 2007    Lab Results:  Results for orders placed or performed during the hospital encounter of 06/06/14 (from the past 48 hour(s))  Glucose, capillary     Status: Abnormal   Collection Time: 06/20/14  1:29 PM  Result Value Ref Range   Glucose-Capillary 132 (H) 70 - 99 mg/dL  TSH     Status: None   Collection Time: 06/20/14  7:26 PM  Result Value Ref Range   TSH 3.611 0.350 - 4.500 uIU/mL    Comment: Performed at [facility]    Physical Findings: AIMS: Facial and Oral Movements Muscles of Facial Expression: None, normal Lips and Perioral Area: None, normal Jaw: None, normal Tongue: None, normal,Extremity Movements Upper (arms, wrists, hands, fingers): None, normal Lower (legs, knees, ankles, toes): None, normal, Trunk Movements Neck, shoulders, hips: None, normal, Overall Severity Severity of abnormal movements (highest score from questions above): None, normal Incapacitation due to abnormal movements: None, normal Patient's awareness of abnormal movements (rate only patient's report): No Awareness, Dental Status Current problems with teeth and/or dentures?: No Does patient usually wear dentures?: No  CIWA:  CIWA-Ar Total: 1 COWS:  COWS Total Score: 3   Assessment- patient is depressed, has chronic suicidal ideations, but is able to contract for safety on unit.Pt started having bilateral lower extremity edema 3 days ago, which is starting to resolve with diuretic.  Treatment Plan Summary: Daily contact with patient to assess and evaluate symptoms and progress in treatment, Medication  management, Plan continue inpatient treatment  and medications as below   1. Did discuss cutting back to 1 antipsychotic as well as discontinuing his testosterone. 2. Patient to rest with his feet above his heart. 3. All medications are reviewed for adverse side effects. 4. Reviewed his TSH is normal and he is informed. As discussed with staff, patient currently on waiting list for [facility]. Medication management: Will continue Abilify 7.5 mgrs QDAY  Carbamazepine 200 mgrs BID Klonopin 1 mgr QID Neurontin 300 mgrs TID Ambien 10 mgrs QHS PRN Insomnia Thorazine 100 mg QHS for insomnia/mood. Continue Minipress 3 mgrs QHS Suicidal ideas; continue to work to instill hope. He is still contracting for safety.  Continue 1:1 observation due to report of pt hoarding meds (with a plan to overdose). Lab: Urine toxicology is positive for benzodiazepine; indicate that patient is taking klonopin.  Pt consents to a room and body search as well (for any contraband/meds). Continue lasix 20 mg daily with K-dur to prevent hypokalemia.   No medication changes at this time, but would consider not using the testosterone due to the possibility of edema, CHF. Would also consider avoiding two antipsychotics if possible.   Medical Decision Making:  Established Problem, Improving (1), Review of Psycho-Social Stressors (1), Review of Medication Regimen & Side Effects (2) and Review of New Medication or Change in Dosage (2) Rona Ravens. [place]burn RPAC 12:38 PM 06/21/2014   Agree with progress note as above  Nehemiah Massed, MD

## 2014-06-21 NOTE — Progress Notes (Signed)
Patient did not attend the evening speaker AA meeting. Pt remained in his bed with his feel elevated and reading his bible.

## 2014-06-21 NOTE — Progress Notes (Signed)
D) Pt talked about having another Pt in his room and justified it by saying "I'm just human" with a big smile. Pt states his is feeling like he is a teenager again and that he is starting to have dreams and aspirations as he did back then. [facility] and is hoping that he will be able to get on disability and be able to access medical care. A) Pt gently confronted about his choice last night, but this writer did not dwell on it. Encouraged Pt to see the hospital as a place of growth. Provided with a 1:1. R) Denies SI and HI. Rates his depression at a 0, his hopelessness at a 0 and his anxiety at a 3

## 2014-06-21 NOTE — [facility] Group Notes (Signed)
[facility] Group Notes:  (Clinical Social Work)  06/21/2014   10:00am-11:00am  Summary of Progress/Problems:  The main focus of today's process group was to discuss different healthy supports that can be put into place at discharge, including music.  Several inspirational songs were played and lyrics handed out, discussed.   The patient expressed understanding of the need to use a variety of support including professionals, music, walking, AA/NA meetings, more.  Type of Therapy:  Music Therapy   Participation Level:  Active  Participation Quality:  Attentive and Sharing  Affect:  Blunted  Cognitive:  Oriented  Insight:  Engaged  Engagement in Therapy:  Engaged  Modes of Intervention:   Activity, Exploration  Ambrose MantleMareida Grossman-Orr, LCSW 06/21/2014, 11:00am

## 2014-06-22 MED ORDER — CLONIDINE HCL 0.1 MG PO TABS
0.1000 mg | ORAL_TABLET | Freq: Two times a day (BID) | ORAL | Status: DC
  Administered 2014-06-22 – 2014-06-23 (×2): 0.1 mg via ORAL
  Filled 2014-06-22 (×5): qty 1

## 2014-06-22 NOTE — Progress Notes (Signed)
Pt attended the AA speaker meeting; however, left early because he stated he was not feeling feeling. BP was checked and writer instructed pt to go lay down in his room. RN notified

## 2014-06-22 NOTE — Progress Notes (Signed)
D: Pt has anxious affect and mood.  Pt expressed concern related to edema of both feet.  Pt reports his goal today was "to get my feet to stop swelling and to continue to work on being happy."  [place].  I'm going to arrange to receive my benefits and have therapy as well."  Pt denies SI/HI, denies hallucinations, denies pain.  Pt did not attend evening group.  Pt had was flirtatious with male peer. A: Encouraged pt to elevate feet and pt did.  Medications administered per order.  PRN medication administered for sleep and anxiety, see flow sheet.  Pt reported anxiety because "my family didn't come see me."  Pt reported he did have a good visit with a [employer] member.  Encouraged and supported pt.  Redirected pt as needed.   R: Pt is compliant with medications.  He verbally contracts for safety stating he would "100%" notify staff of any thoughts of self-harm/SI/HI prior to acting on them.  He is currently resting in his room with his feet elevated.  Respirations are even and unlabored.  Will continue to monitor and assess.

## 2014-06-22 NOTE — Clinical Social Work Note (Signed)
[facility] called to check on patient status, patient remains on their wait list.  Santa GeneraAnne Kirrah Mustin, LCSW Clinical Social Worker

## 2014-06-22 NOTE — Progress Notes (Signed)
Recreation Therapy Notes  Date: 03.28.2016 Time: 9:30am Location: [address] Group Room   Group Topic: Stress Management  Goal Area(s) Addresses:  Patient will actively participate in stress management techniques presented during session.   Behavioral Response: Did not attend.   Marykay Lexenise L Bilal Manzer, LRT/CTRS  Jorryn Hershberger L 06/22/2014 10:04 AM

## 2014-06-22 NOTE — Progress Notes (Signed)
D: Pt has anxious affect and mood.  Pt reported his goal today was to work on his discharge plan and "for my foot swelling to go down a little."  Pt describes his mood as "nervous."  Pt reports he is discharging tomorrow and will be staying with his pastor.  Pt denies SI/HI, denies hallucinations, denies pain.  Pt exhibited inappropriate boundaries with peer by asking peer to come in his room (according to pt).  Pt and peer were also seen touching each other by staff via camera.    A: Treatment agreement rules of not going into peers' rooms and maintaining appropriate boundaries were reviewed with pt at the beginning of the shift.  Pt verbalized understanding.  After peer and pt were seen touching on camera, peer was moved to another hall.  [facility]call provider[facility], and charge nurse aware that peer was in pt's room and that pt and peer violated boundaries in the day room.  Pt was hypertensive and reported "I don't feel good" during group.  Pt instructed to rest in his room.  MD notified and Clonidine 0.1 mg ordered and administered.  PRN medication administered for sleep, see flow sheet.  Encouraged and supported pt.  Medications administered per order.   R: Pt was compliant with medications.  He verbally contracted for safety.  Will continue to monitor and assess.

## 2014-06-22 NOTE — Clinical Social Work Note (Signed)
CSW spoke with pt individually to discuss discharge plan. Pt plans to stay with his pastor temporarily until he is able to move into his apt. Pt has follow-up appt for med management scheduled with his Primary Care Provider and has therapy appt with [facility] for 1:1 therapy. Pt has also been given [place] if he decides to use them as an additional support-pt still reporting distrust and paranoia regarding the [place]. Pt states that he is ready to discharge on Tuesday, but is "nervous about starting fresh." Pt denies SI/HI/AVH. He rates depression as low and anxiety as high "I'm excited and nervous to leave the hospital. But it's time." Pt is still refusing to allow contact with his supports/family but stated that he is giving his guns to his pastor tomorrow. "I have no need for them now or in the future."   The Sherwin-WilliamsHeather Smart, LCSWA 06/22/2014 4:16 PM

## 2014-06-22 NOTE — [facility] Group Notes (Signed)
[facility]   06/22/2014 10:58 AM  Participation Quality:  Appropriate   Mood/Affect:  Appropriate  Depression Rating:  0  Anxiety Rating:  0  Thoughts of Suicide:  No Will you contract for safety?   NA  Current AVH:  No  Plan for Discharge/Comments:  Pt reports that his 1:1 was dropped on Saturday. "I've been really good." Pt talked to CSW privately after group. He reports that he is feeling much better over the past few days due to unexpected support from his church. "they helped me get an appt. I did the paperwork this weekend and they linked me with a job." Pt's disability attorney visited him this morning as well. Pt pleasant and calm during interaction. Pt states that he is not experiencing SI "I wouldn't lie to you about that." Pt reports that his medication is working well and he is wanting to make sure that he gets connected with outpatient agency that will continue his mental health medications. CSW assessing.   Transportation Means: unknown at this time   Supports: supports from his church.   Smart, American FinancialHeather LCSWA

## 2014-06-22 NOTE — Progress Notes (Signed)
D: Patient has anxious affect and mood. He reported on the self inventory sheet that his sleep, appetite and ability to concentrate are all good and energy level is normal. Patient rates depression, feelings of hopelessness and anxiety "4". He's interacting with peers in the dayroom, playing cards and conversing amongst others. Continues to express concern about the swelling in his feet. Complies with the medication regimen.   A: Support and encouragement provided to patient. Scheduled medications given per MD orders. Maintain Q15 minute checks for safety.  R: Patient receptive. Denies SI/HI and AVH. Patient remains safe on the hall.

## 2014-06-22 NOTE — Plan of Care (Signed)
Problem: Alteration in mood Goal: LTG-Patient reports reduction in suicidal thoughts (Patient reports reduction in suicidal thoughts and is able to verbalize a safety plan for whenever patient is feeling suicidal)  Outcome: Progressing Pt denied SI/ thoughts of self-harm this shift.  He verbally contracted for safety.

## 2014-06-22 NOTE — Progress Notes (Signed)
[name], male   DOB: July 12, 1970, 44 y.o.   MRN: [id number]  [facility] MD Progress Note  06/22/2014 10:39 AM Phillip [name]  MRN:  [id number]   Subjective:" I have been  Doing really well for the past few days, I only feel anxious about my future.''  Objective:  Patient seen and chart reviewed. Patient appears very optimistic today after he met with his lawyer who is applying for his disability through the [place]. He is [place] war Veteran who is currently not service connected with the [place]. He is reporting decreased anxiety, depression or PTSD. Today he rates his depression as 1-2/10, anxiety 4/10, denies mood swings, psychosis, paranoia, homicidal or suicidal ideations, intent or plan. He is reporting that his current medication regimen is working great for him. Patient attributed his stressor to losing his job, non-compliant with his medications prior to admission and problem with his girl friend. However, he now reports that he feels loved by people again, has a job set up for him upon discharge and happy that he is getting help to be service connected to the [place]. Patient states that he is in the right frame of mind, contract for safety and requesting to be discharge soon. He has not exhibited any self injurious behavior since admission.   Principal Problem: PTSD (post-traumatic stress disorder) Diagnosis:   Patient Active Problem List   Diagnosis Date Noted  . Suicidal ideations [R45.851] 06/05/2014    Priority: High  . MDD (major depressive disorder), recurrent, severe, with psychosis [F33.3] 05/18/2014    Priority: High  . PTSD (post-traumatic stress disorder) [F43.10] 01/29/2014    Priority: High  . Generalized anxiety disorder [F41.1] 06/19/2011    Priority: High    Class: Chronic  . Major depression [F32.2] 06/06/2014  . Panic disorder [F41.0] 05/19/2014   Total Time spent with patient: 20 minutes   Past Medical History:  Past Medical History  Diagnosis Date  . Anxiety   .  Depression   . PTSD (post-traumatic stress disorder)     Past Surgical History  Procedure Laterality Date  . Cholesterol     Family History:  Family History  Problem Relation Age of Onset  . Cancer Mother   . Cancer Father   . Depression Father    Social History:  History  Alcohol Use No     History  Drug Use No    Comment: denies    History   Social History  . Marital Status: Married    Spouse Name: N/A  . Number of Children: N/A  . Years of Education: N/A   Social History Main Topics  . Smoking status: Current Every Day Smoker -- 1.00 packs/day for 19 years    Types: Cigarettes  . Smokeless tobacco: Not on file  . Alcohol Use: No  . Drug Use: No     Comment: denies  . Sexual Activity: Yes    Birth Control/ Protection: None   Other Topics Concern  . None   Social History Narrative   Additional History:    Sleep:  Fair (waking up often last night to urinate, due to lasix), chart indicates slept 6.75 hours  Appetite:  Fair  Assessment:   Musculoskeletal: Strength & Muscle Tone: within normal limits Gait & Station: normal Patient leans: N/A   Psychiatric Specialty Exam: Physical Exam  Vitals reviewed. Psychiatric: His speech is normal and behavior is normal. Judgment and thought content normal. His mood appears anxious. Cognition and memory are normal.  Review of Systems  Constitutional: Negative.   HENT: Negative.   Eyes: Negative.   Respiratory: Negative.   Cardiovascular: Positive for leg swelling.  Gastrointestinal: Negative.   Genitourinary: Negative.   Musculoskeletal: Negative.   Skin: Negative.   Neurological: Negative.   Endo/Heme/Allergies: Negative.   Psychiatric/Behavioral: The patient is nervous/anxious.     Blood pressure 147/98, pulse 96, temperature 98.1 F (36.7 C), temperature source Oral, resp. rate 20, height 5' 10.5" (1.791 m), weight 82.555 kg (182 lb), SpO2 99 %.Body mass index is 25.74 kg/(m^2).  General  Appearance: Fairly Groomed  Engineer, water::  Good  Speech:  Clear and Coherent  Volume:  Normal  Mood:  Dysphoric  Affect:  Appropriate,   Thought Process:  Coherent and Goal Directed  Orientation:  Full (Time, Place, and Person)  Thought Content:  Negative  Suicidal Thoughts:  No   Homicidal Thoughts:  No,     Memory:  Remote and recent grossly intact  Judgement: marginal  Insight:  Shallow  Psychomotor Activity:  Decreased  Concentration:  Good  Recall:  Good  Fund of Knowledge:Good  Language: Good  Akathisia:  No  Handed:  Right  AIMS (if indicated):     Assets:  Communication Skills Desire for Improvement Resilience  ADL's:  Intact  Cognition: WNL  Sleep:  Number of Hours: 6   Current Medications: Current Facility-Administered Medications  Medication Dose Route Frequency Provider Last Rate Last Dose  . acetaminophen (TYLENOL) tablet 650 mg  650 mg Oral Q4H PRN Lurena Nida, NP      . alum & mag hydroxide-simeth (MAALOX/MYLANTA) 200-200-20 MG/5ML suspension 30 mL  30 mL Oral Q4H PRN Nicholaus Bloom, MD   30 mL at 06/10/14 1651  . ARIPiprazole (ABILIFY) tablet 7.5 mg  7.5 mg Oral QHS Skip Estimable, MD   7.5 mg at 06/21/14 2012  . haloperidol (HALDOL) tablet 5 mg  5 mg Oral Q6H PRN Laverle Hobby, PA-C   5 mg at 06/18/14 2143   And  . benztropine (COGENTIN) tablet 1 mg  1 mg Oral Q6H PRN Laverle Hobby, PA-C   1 mg at 06/18/14 2143  . carbamazepine (EQUETRO) 12 hr capsule 200 mg  200 mg Oral BID Nicholaus Bloom, MD   200 mg at 06/22/14 6546  . chlorproMAZINE (THORAZINE) tablet 100 mg  100 mg Oral QHS Skip Estimable, MD   100 mg at 06/21/14 2012  . clonazePAM (KLONOPIN) tablet 1 mg  1 mg Oral QID Lamya Lausch   1 mg at 06/22/14 0609  . feeding supplement (ENSURE COMPLETE) (ENSURE COMPLETE) liquid 237 mL  237 mL Oral BID BM Nicholaus Bloom, MD   237 mL at 06/21/14 1152  . furosemide (LASIX) tablet 20 mg  20 mg Oral Daily Niel Hummer, NP   20 mg at 06/22/14 0829  .  gabapentin (NEURONTIN) capsule 300 mg  300 mg Oral TID Nicholaus Bloom, MD   300 mg at 06/22/14 5035  . hydrOXYzine (ATARAX/VISTARIL) tablet 25 mg  25 mg Oral Q6H PRN Harriet Butte, NP   25 mg at 06/21/14 2141  . magnesium hydroxide (MILK OF MAGNESIA) suspension 30 mL  30 mL Oral Daily PRN Nicholaus Bloom, MD      . nicotine (NICODERM CQ - dosed in mg/24 hours) patch 21 mg  21 mg Transdermal Daily Harriet Butte, NP   21 mg at 06/22/14 4656  . pantoprazole (PROTONIX) EC tablet 20  mg  20 mg Oral Daily Kerrie Buffalo, NP   20 mg at 06/22/14 8032  . potassium chloride (K-DUR,KLOR-CON) CR tablet 10 mEq  10 mEq Oral Daily Niel Hummer, NP   10 mEq at 06/22/14 0829  . prazosin (MINIPRESS) capsule 3 mg  3 mg Oral QHS Skip Estimable, MD   3 mg at 06/21/14 2013  . testosterone cypionate (DEPOTESTOTERONE CYPIONATE) injection 100 mg  100 mg Intramuscular Q14 Days Nicholaus Bloom, MD   100 mg at 06/09/14 1537  . zolpidem (AMBIEN) tablet 10 mg  10 mg Oral QHS PRN Skip Estimable, MD   10 mg at 06/21/14 2012    Lab Results:  Results for orders placed or performed during the hospital encounter of 06/06/14 (from the past 48 hour(s))  Glucose, capillary     Status: Abnormal   Collection Time: 06/20/14  1:29 PM  Result Value Ref Range   Glucose-Capillary 132 (H) 70 - 99 mg/dL  TSH     Status: None   Collection Time: 06/20/14  7:26 PM  Result Value Ref Range   TSH 3.611 0.350 - 4.500 uIU/mL    Comment: Performed at [facility]    Physical Findings: AIMS: Facial and Oral Movements Muscles of Facial Expression: None, normal Lips and Perioral Area: None, normal Jaw: None, normal Tongue: None, normal,Extremity Movements Upper (arms, wrists, hands, fingers): None, normal Lower (legs, knees, ankles, toes): None, normal, Trunk Movements Neck, shoulders, hips: None, normal, Overall Severity Severity of abnormal movements (highest score from questions above): None, normal Incapacitation due  to abnormal movements: None, normal Patient's awareness of abnormal movements (rate only patient's report): No Awareness, Dental Status Current problems with teeth and/or dentures?: No Does patient usually wear dentures?: No  CIWA:  CIWA-Ar Total: 1 COWS:  COWS Total Score: 3   Assessment- resolving symptoms of depression and anxiety  Treatment Plan Summary: Daily contact with patient to assess and evaluate symptoms and progress in treatment, Medication management, Plan continue inpatient treatment  and medications as below    As discussed with staff, patient currently on waiting list for [facility]. Medication management: Will continue Abilify 7.5 mgrs QDAY  Carbamazepine 200 mgrs BID Klonopin 1 mgr QID Neurontin 300 mgrs TID Ambien 10 mgrs QHS PRN Insomnia Thorazine 100 mg QHS for insomnia/mood. Continue Minipress 3 mgrs QHS Continue lasix 20 mg daily with K-dur to prevent hypokalemia.  Patient is stable for discharge tomorrow 06/23/14.  Medical Decision Making:  Established Problem, Improving (1), Review of Psycho-Social Stressors (1), Review of Medication Regimen & Side Effects (2) and Review of New Medication or Change in Dosage (2)  Corena Pilgrim, MD  06/22/2014, 10:39 AM

## 2014-06-22 NOTE — [facility] Group Notes (Signed)
[facility] LCSW Group Therapy  06/22/2014 3:37 PM  Type of Therapy:  Group Therapy  Participation Level:  Active  Participation Quality:  Attentive  Affect:  Appropriate  Cognitive:  Alert and Oriented  Insight:  Engaged  Engagement in Therapy:  Engaged  Modes of Intervention:  Confrontation, Discussion, Education, Exploration, Problem-solving, Rapport Building, Socialization and Support  Summary of Progress/Problems: Today's Topic: Overcoming Obstacles. Pt identified obstacles faced currently and processed barriers involved in overcoming these obstacles. Pt identified steps necessary for overcoming these obstacles and explored motivation (internal and external) for facing these difficulties head on. Pt further identified one area of concern in their lives and chose a skill of focus pulled from their "toolbox." Phillip [name] was attentive and engaged during today's processing group. He shared that his goal was to stay on his medication and get back to work after d/c. Phillip [name] shared that his primary obstacle was "my mind getting in my way. My suicidal thoughts and my flashbacks." Phillip [name] talked about how he feels safe in the hospital and feels that his medications are working well to help him manage his depression and anxiety. CSW and Phillip [name] talked about reaching out for help before Sx get so bad that he requires hospitalization. He continues to show progress in the group setting and improving insight.    Phillip [name], Phillip [name] LCSWA  06/22/2014, 3:37 PM

## 2014-06-23 MED ORDER — FUROSEMIDE 20 MG PO TABS: 20.0000 mg | ORAL_TABLET | Freq: Every day | ORAL | Status: DC

## 2014-06-23 MED ORDER — PRAZOSIN HCL 1 MG PO CAPS: 3.0000 mg | ORAL_CAPSULE | Freq: Every day | ORAL | Status: DC

## 2014-06-23 MED ORDER — CLONIDINE HCL 0.1 MG PO TABS: 0.1000 mg | ORAL_TABLET | Freq: Two times a day (BID) | ORAL | Status: DC

## 2014-06-23 MED ORDER — NICOTINE 21 MG/24HR TD PT24: 21.0000 mg | MEDICATED_PATCH | Freq: Every day | TRANSDERMAL | Status: DC

## 2014-06-23 MED ORDER — FAMOTIDINE 20 MG PO TABS: 20.0000 mg | ORAL_TABLET | Freq: Two times a day (BID) | ORAL | Status: DC

## 2014-06-23 MED ORDER — HYDROXYZINE HCL 25 MG PO TABS: 25.0000 mg | ORAL_TABLET | Freq: Four times a day (QID) | ORAL | Status: DC | PRN

## 2014-06-23 MED ORDER — CARBAMAZEPINE ER 200 MG PO CP12: 200.0000 mg | ORAL_CAPSULE | Freq: Two times a day (BID) | ORAL | Status: DC

## 2014-06-23 MED ORDER — ZOLPIDEM TARTRATE 10 MG PO TABS: 10.0000 mg | ORAL_TABLET | Freq: Every evening | ORAL | Status: DC | PRN

## 2014-06-23 MED ORDER — POTASSIUM CHLORIDE CRYS ER 10 MEQ PO TBCR: 10.0000 meq | EXTENDED_RELEASE_TABLET | Freq: Every day | ORAL | Status: DC

## 2014-06-23 MED ORDER — ARIPIPRAZOLE 15 MG PO TABS: 7.5000 mg | ORAL_TABLET | Freq: Every day | ORAL | Status: DC

## 2014-06-23 MED ORDER — CLONAZEPAM 1 MG PO TABS: 1.0000 mg | ORAL_TABLET | Freq: Four times a day (QID) | ORAL | Status: DC

## 2014-06-23 MED ORDER — TESTOSTERONE CYPIONATE 200 MG/ML IM SOLN: 100.0000 mg | INTRAMUSCULAR | Status: DC

## 2014-06-23 MED ORDER — GABAPENTIN 300 MG PO CAPS: 300.0000 mg | ORAL_CAPSULE | Freq: Three times a day (TID) | ORAL | Status: DC

## 2014-06-23 NOTE — Progress Notes (Signed)
  [facility]  Will you be returning to the same living situation after discharge:  No. Pt will be staying with pastor at d/c.  At discharge, do you have transportation home?: Yes,  pastor Do you have the ability to pay for your medications: Yes,  mental health  Release of information consent forms completed and submitted to Medical Records by CSW.  Patient to Follow up at: Follow-up Information    Follow up with Lovenia KimMark Hepler MD-[facility]  On 06/29/2014.   Why:  Appt on this date at 1:30PM for hospital follow-up/medication management with Lovenia KimMark Hepler, MD.    Contact information:   [address] [place], [place] [address] Phone: [phone] Fax: [phone]      Follow up with [facility] On 07/02/2014.   Why:  Appt on this date at 9:00AM with Rudi RummageSharon Burkett for therapy. Please bring photo ID to this appt. Please cancel/reschedule if necessary within 48 hours of appt.    Contact information:   [address] [place], [place] [address] Phone: [phone] Fax: [phone]      Patient denies SI/HI: Yes,  during group/self report.     Safety Planning and Suicide Prevention discussed: Yes,  Pt continues to refuse to allow contact with family/friends. SPE completed with pt. SPI pamphlet provided to pt and he was encouraged to share information with support network, ask questions, and talk about any concerns relating to SPE.Pt reports that he plans to give his gun(s) to his pastor today at discharge but is refusing to say where they are.   Have you used any form of tobacco in the last 30 days? (Cigarettes, Smokeless Tobacco, Cigars, and/or Pipes): Yes  Has patient been referred to the Quitline?: Patient refused referral  Smart, Lebron QuamHeather LCSWA  06/23/2014, 9:40 AM

## 2014-06-23 NOTE — [facility] Suicide Risk Assessment (Signed)
[facility]   Demographic Factors:  Male, Caucasian and Unemployed  Total Time spent with patient: 30 minutes  Musculoskeletal: Strength & Muscle Tone: within normal limits Gait & Station: normal Patient leans: N/A  Psychiatric Specialty Exam: Physical Exam  Psychiatric: His speech is normal and behavior is normal. Judgment and thought content normal. His mood appears anxious. Cognition and memory are normal.    Review of Systems  Constitutional: Negative.   HENT: Negative.   Eyes: Negative.   Respiratory: Negative.   Cardiovascular: Positive for leg swelling.  Gastrointestinal: Negative.   Genitourinary: Negative.   Musculoskeletal: Negative.   Skin: Negative.   Neurological: Negative.   Endo/Heme/Allergies: Negative.   Psychiatric/Behavioral: The patient is nervous/anxious.     Blood pressure 121/87, pulse 93, temperature 97.5 F (36.4 C), temperature source Oral, resp. rate 18, height 5' 10.5" (1.791 m), weight 82.555 kg (182 lb), SpO2 99 %.Body mass index is 25.74 kg/(m^2).  General Appearance: Casual  Eye Contact::  Good  Speech:  Clear and Coherent409  Volume:  Normal  Mood:  Euthymic  Affect:  Appropriate  Thought Process:  Linear and Logical  Orientation:  Full (Time, Place, and Person)  Thought Content:  Negative  Suicidal Thoughts:  No  Homicidal Thoughts:  No  Memory:  Immediate;   Good Recent;   Good Remote;   Good  Judgement:  Fair  Insight:  Fair  Psychomotor Activity:  Normal  Concentration:  Good  Recall:  Good  Fund of Knowledge:Good  Language: Good  Akathisia:  No  Handed:  Right  AIMS (if indicated):     Assets:  Communication Skills Desire for Improvement Physical Health  Sleep:  Number of Hours: 5.5  Cognition: WNL  ADL's:  Intact   Have you used any form of tobacco in the last 30 days? (Cigarettes, Smokeless Tobacco, Cigars, and/or Pipes): Yes  Has this patient used any form of tobacco in the last 30  days? (Cigarettes, Smokeless Tobacco, Cigars, and/or Pipes) Yes, A prescription for an FDA-approved tobacco cessation medication was offered at discharge and the patient refused  Mental Status Per Nursing Assessment::   On Admission:     Current Mental Status by Physician: Patient denies suicidal/homicidal ideation, intent or plan  Loss Factors: Financial problems/change in socioeconomic status  Historical Factors: NA  Risk Reduction Factors:   Living with another person, especially a relative, Positive social support and Positive therapeutic relationship  Continued Clinical Symptoms:  Resolving mood symptoms  Cognitive Features That Contribute To Risk:  Closed-mindedness    Suicide Risk:  Minimal: No identifiable suicidal ideation.  Patients presenting with no risk factors but with morbid ruminations; may be classified as minimal risk based on the severity of the depressive symptoms  Principal Problem: PTSD (post-traumatic stress disorder) Discharge Diagnoses:  Patient Active Problem List   Diagnosis Date Noted  . Suicidal ideations [R45.851] 06/05/2014    Priority: High  . MDD (major depressive disorder), recurrent, severe, with psychosis [F33.3] 05/18/2014    Priority: High  . PTSD (post-traumatic stress disorder) [F43.10] 01/29/2014    Priority: High  . Generalized anxiety disorder [F41.1] 06/19/2011    Priority: High    Class: Chronic  . Major depression [F32.2] 06/06/2014  . Panic disorder [F41.0] 05/19/2014    Follow-up Information    Follow up with Lovenia KimMark Hepler MD-[facility]  On 06/29/2014.   Why:  Appt on this date at 1:30PM for hospital follow-up/medication management with Lovenia KimMark Hepler, MD.  Contact information:   [address] [place], [place] [address] Phone: [phone] Fax: [phone]      Follow up with [facility] On 07/02/2014.   Why:  Appt on this date at 9:00AM with Rudi Rummage for therapy. Please bring photo ID to this appt.  Please cancel/reschedule if necessary within 48 hours of appt.    Contact information:   [address] [place] [address] Phone: [phone] Fax: [phone]      Plan Of Care/Follow-up recommendations:  Activity:  as tolerated Diet:  healthy  Is patient on multiple antipsychotic therapies at discharge:  No   Has Patient had three or more failed trials of antipsychotic monotherapy by history:  No  Recommended Plan for Multiple Antipsychotic Therapies: NA    Thedore Mins, MD 06/23/2014, 10:32 AM

## 2014-06-23 NOTE — Progress Notes (Signed)
Discharge Note: Discharge instructions/prescriptions/medication samples given to patient. Patient verbalized understanding of discharge instructions and prescriptions. Returned belongings to patient. Denies SI/HI/AVH. Patient d/c without incident to the lobby and transported home with his pastor.

## 2014-06-23 NOTE — Discharge Summary (Signed)
Physician Discharge Summary Note  Patient:  Phillip [name] is an 44 y.o., male MRN:  [id number] DOB:  16-Aug-1970 Patient phone:  [phone] (home)  Patient address:   [address] [place] [id number],  Total Time spent with patient: Greater than 30 minutes  Date of Admission:  06/06/2014  Date of Discharge: 06/22/14  Reason for Admission: Worsening symptoms of depression & Suicidal ideations  Principal Problem: PTSD (post-traumatic stress disorder) Discharge Diagnoses: Patient Active Problem List   Diagnosis Date Noted  . Major depression [F32.2] 06/06/2014  . Suicidal ideations [R45.851] 06/05/2014  . Panic disorder [F41.0] 05/19/2014  . MDD (major depressive disorder), recurrent, severe, with psychosis [F33.3] 05/18/2014  . PTSD (post-traumatic stress disorder) [F43.10] 01/29/2014  . Generalized anxiety disorder [F41.1] 06/19/2011    Class: Chronic   Musculoskeletal: Strength & Muscle Tone: within normal limits Gait & Station: normal Patient leans: N/A  Psychiatric Specialty Exam: Physical Exam  Psychiatric: His speech is normal and behavior is normal. Judgment and thought content normal. His mood appears not anxious. His affect is not angry, not blunt and not labile. Cognition and memory are normal. He does not exhibit a depressed mood.    Review of Systems  Constitutional: Negative.   HENT: Negative.   Eyes: Negative.   Respiratory: Negative.   Cardiovascular: Negative.   Gastrointestinal: Negative.   Genitourinary: Negative.   Musculoskeletal: Negative.   Skin: Negative.   Neurological: Negative.   Endo/Heme/Allergies: Negative.   Psychiatric/Behavioral: Positive for depression (Stabilized wit medication prior to discharge). Negative for suicidal ideas, hallucinations, memory loss and substance abuse. The patient has insomnia (Stabilized with medication prior to discharge). The patient is not nervous/anxious.     Blood pressure 121/87, pulse 93,  temperature 97.5 F (36.4 C), temperature source Oral, resp. rate 18, height 5' 10.5" (1.791 m), weight 82.555 kg (182 lb), SpO2 99 %.Body mass index is 25.74 kg/(m^2).  See Md's SRA   Past Medical History:  Past Medical History  Diagnosis Date  . Anxiety   . Depression   . PTSD (post-traumatic stress disorder)     Past Surgical History  Procedure Laterality Date  . Cholesterol     Family History:  Family History  Problem Relation Age of Onset  . Cancer Mother   . Cancer Father   . Depression Father    Social History:  History  Alcohol Use No     History  Drug Use No    Comment: denies    History   Social History  . Marital Status: Married    Spouse Name: N/A  . Number of Children: N/A  . Years of Education: N/A   Social History Main Topics  . Smoking status: Current Every Day Smoker -- 1.00 packs/day for 19 years    Types: Cigarettes  . Smokeless tobacco: Not on file  . Alcohol Use: No  . Drug Use: No     Comment: denies  . Sexual Activity: Yes    Birth Control/ Protection: None   Other Topics Concern  . None   Social History Narrative   Risk to Self: Is patient at risk for suicide?: No Risk to Others: No Prior Inpatient Therapy: No Prior Outpatient Therapy: No  Level of Care:  OP  Hospital Course:  44 Y/o male D/C from our unit a week ago. States that when he left he went back to his GF. States that things did not work out. They broke up. States he also lost his  job. States he has nothing left. States that killing himself is "the right thing to do." states he has pushed everyone away has no family left. He admits he went off the medications he was D/C on. Claims side effects to the Haldol. Phillip [name] has a hx of PTSD, Anxiety, and Depression dx. He presents with depressed mood and affect, becoming tearful throughout assessment.  During this present hospital stay, Phillip [name] was assessed/evaluated & started on medication regimen targeting his presenting  symptoms. He was medicated & discharged on Abilify 15 mg for mood control, Clonazepam 1 mg for severe anxiety, Tegretol 200 mg for mood stabilization, Neurontin 300 mg for agitation, prazosin 1 mg for nightmares & Ambien 10 mg for insomnia. He was also enrolled & participated in the group counseling sessions being offered and held on this unit. He learned coping skills. He also received other medication regimen for the other medical issues that he presented, including primary care consults for lower extremity swelling and a trip to the Ed for evaluation of medical complaints.  During the course of his hospital stay, Phillip [name] was placed on a 1:1 supervision for a good number days for complaints of suicidal ideation and inability to contract for safety. His symptoms eventually responded well to his treatment regimen. This is evidenced by hie reports of improved mood, absence of suicidal ideations and presentation of good affects/eye contacts. Then, his 1:1 supervision was discontinued. Phillip [name] is currently stable enough for discharge. He will resume psychiatric, medical care & medication management on outpatient basis as noted below. He is provided with all the necessary information needed to make these appointments without problems.  Upon discharge, he adamantly denies any SIHI, AVH, delusional thoughts and or paranoia. He is provided with a 4 days worth, supply samples of his [facility] discharge medications. He left [facility] with all personal belongings in no apparent distress. Transportation per his pastor.  Consults:  psychiatry  Significant Diagnostic Studies:  labs: CBC with diff, CMP, UDS, toxicology tests, U/A, results reviewed, stable  Discharge Vitals:   Blood pressure 121/87, pulse 93, temperature 97.5 F (36.4 C), temperature source Oral, resp. rate 18, height 5' 10.5" (1.791 m), weight 82.555 kg (182 lb), SpO2 99 %. Body mass index is 25.74 kg/(m^2). Lab Results:   Results for orders placed or performed  during the hospital encounter of 06/06/14 (from the past 72 hour(s))  Glucose, capillary     Status: Abnormal   Collection Time: 06/20/14  1:29 PM  Result Value Ref Range   Glucose-Capillary 132 (H) 70 - 99 mg/dL  TSH     Status: None   Collection Time: 06/20/14  7:26 PM  Result Value Ref Range   TSH 3.611 0.350 - 4.500 uIU/mL    Comment: Performed at [facility]    Physical Findings: AIMS: Facial and Oral Movements Muscles of Facial Expression: None, normal Lips and Perioral Area: None, normal Jaw: None, normal Tongue: None, normal,Extremity Movements Upper (arms, wrists, hands, fingers): None, normal Lower (legs, knees, ankles, toes): None, normal, Trunk Movements Neck, shoulders, hips: None, normal, Overall Severity Severity of abnormal movements (highest score from questions above): None, normal Incapacitation due to abnormal movements: None, normal Patient's awareness of abnormal movements (rate only patient's report): No Awareness, Dental Status Current problems with teeth and/or dentures?: No Does patient usually wear dentures?: No  CIWA:  CIWA-Ar Total: 1 COWS:  COWS Total Score: 3   See Psychiatric Specialty Exam and Suicide Risk Assessment completed by Attending Physician prior  to discharge.  Discharge destination:  Home  Is patient on multiple antipsychotic therapies at discharge:  No   Has Patient had three or more failed trials of antipsychotic monotherapy by history:  No  Recommended Plan for Multiple Antipsychotic Therapies: NA    Medication List    STOP taking these medications        benztropine 0.5 MG tablet  Commonly known as:  COGENTIN     docusate sodium 100 MG capsule  Commonly known as:  COLACE     fluvoxaMINE 50 MG tablet  Commonly known as:  LUVOX     haloperidol 5 MG tablet  Commonly known as:  HALDOL     traZODone 150 MG tablet  Commonly known as:  DESYREL      TAKE these medications      Indication    ARIPiprazole 15 MG tablet  Commonly known as:  ABILIFY  Take 0.5 tablets (7.5 mg total) by mouth at bedtime. For Depression/mood control   Indication:  Major Depressive Disorder, Mood control     carbamazepine 200 MG Cp12 12 hr capsule  Commonly known as:  EQUETRO  Take 1 capsule (200 mg total) by mouth 2 (two) times daily. Mood stabilization   Indication:  Mood stabilization     clonazePAM 1 MG tablet  Commonly known as:  KLONOPIN  Take 1 tablet (1 mg total) by mouth 4 (four) times daily. For severe anxiety   Indication:  Severe anxiety     cloNIDine 0.1 MG tablet  Commonly known as:  CATAPRES  Take 1 tablet (0.1 mg total) by mouth 2 (two) times daily. For high blood pressure   Indication:  HTN     famotidine 20 MG tablet  Commonly known as:  PEPCID  Take 1 tablet (20 mg total) by mouth 2 (two) times daily. For acid reflux   Indication:  Gastroesophageal Reflux Disease     furosemide 20 MG tablet  Commonly known as:  LASIX  Take 1 tablet (20 mg total) by mouth daily. For swellings   Indication:  Edema     gabapentin 300 MG capsule  Commonly known as:  NEURONTIN  Take 1 capsule (300 mg total) by mouth 3 (three) times daily. For agitation   Indication:  Agitation     hydrOXYzine 25 MG tablet  Commonly known as:  ATARAX/VISTARIL  Take 1 tablet (25 mg total) by mouth every 6 (six) hours as needed for anxiety.   Indication:  Anxiety Neurosis, Anxiety     nicotine 21 mg/24hr patch  Commonly known as:  NICODERM CQ - dosed in mg/24 hours  Place 1 patch (21 mg total) onto the skin daily. For nicotine addiction   Indication:  Nicotine Addiction     potassium chloride 10 MEQ tablet  Commonly known as:  K-DUR,KLOR-CON  Take 1 tablet (10 mEq total) by mouth daily. For low potassium   Indication:  Low Amount of Potassium in the Blood     prazosin 1 MG capsule  Commonly known as:  MINIPRESS  Take 3 capsules (3 mg total) by mouth at bedtime. For nightmares   Indication:   Nightmares     testosterone cypionate 200 MG/ML injection  Commonly known as:  DEPOTESTOTERONE CYPIONATE  Inject 0.5 mLs (100 mg total) into the muscle every 14 (fourteen) days. For low testosterone replacement   Indication:  Hypogonadotropic Hypogonadism     zolpidem 10 MG tablet  Commonly known as:  AMBIEN  Take 1 tablet (10  mg total) by mouth at bedtime as needed for sleep.   Indication:  Trouble Sleeping       Follow-up Information    Follow up with Lovenia Kim [facility]  On 06/29/2014.   Why:  Appt on this date at 1:30PM for hospital follow-up/medication management with Lovenia Kim, MD.    Contact information:   [address] [place], [place] [address] Phone: [phone] Fax: [phone]      Follow up with [facility] On 07/02/2014.   Why:  Appt on this date at 9:00AM with Rudi Rummage for therapy. Please bring photo ID to this appt. Please cancel/reschedule if necessary within 48 hours of appt.    Contact information:   [address] [place] [address] Phone: [phone] Fax: [phone]     Follow-up recommendations: Activity:  As tolerated Diet: As recommended by your primary care doctor. Keep all scheduled follow-up appointments as recommended.   Comments: Take all your medications as prescribed by your mental healthcare provider. Report any adverse effects and or reactions from your medicines to your outpatient provider promptly. Patient is instructed and cautioned to not engage in alcohol and or illegal drug use while on prescription medicines. In the event of worsening symptoms, patient is instructed to call the crisis hotline, 911 and or go to the nearest ED for appropriate evaluation and treatment of symptoms. Follow-up with your primary care provider for your other medical issues, concerns and or health care needs.  Total Discharge Time: Greater than 30 minutes  Signed: Sanjuana Kava, PMHNp-BC 06/23/2014, 10:09 AM  Patient seen  face-to-face for psychiatric evaluation, chart reviewed and case discussed with the physician extender and developed treatment plan. Reviewed the information documented and agree with the treatment plan. Thedore Mins, MD

## 2014-06-23 NOTE — [facility] Group Notes (Signed)
The focus of this group is to educate the patient on the purpose and policies of crisis stabilization and provide a format to answer questions about their admission.  The group details unit policies and expectations of patients while admitted.  Patient attended 0900 nurse education orientation group this morning.  Patient actively participated, appropriate affect, alert, appropriate insight and engagement.  Today patient will work on 3 goals for discharge.  

## 2014-06-24 NOTE — Progress Notes (Signed)
Patient Discharge Instructions:  After Visit Summary (AVS):   Faxed to:  06/24/14 Discharge Summary Note:   Faxed to:  06/24/14 Psychiatric Admission Assessment Note:   Faxed to:  06/24/14 Suicide Risk Assessment - Discharge Assessment:   Faxed to:  06/24/14 Faxed/Sent to the Next Level Care provider:  06/24/14 Faxed to [facility] @ [phone] [facility] to [facility] @ [phone]  Jerelene ReddenSheena E [place], 06/24/2014, 3:31 PM

## 2014-07-29 ENCOUNTER — Emergency Department (HOSPITAL_COMMUNITY)
Admission: EM | Admit: 2014-07-29 | Discharge: 2014-07-29 | Disposition: A | Discharge status: Home / Self Care | Payer: Self-pay | Attending: Emergency Medicine | Admitting: Emergency Medicine

## 2014-07-29 ENCOUNTER — Emergency Department (HOSPITAL_COMMUNITY): Payer: Self-pay

## 2014-07-29 ENCOUNTER — Encounter (HOSPITAL_COMMUNITY): Payer: Self-pay | Admitting: Emergency Medicine

## 2014-07-29 DIAGNOSIS — Z720 Tobacco use: Secondary | ICD-10-CM | POA: Insufficient documentation

## 2014-07-29 DIAGNOSIS — R0789 Other chest pain: Secondary | ICD-10-CM | POA: Insufficient documentation

## 2014-07-29 DIAGNOSIS — F4310 Post-traumatic stress disorder, unspecified: Secondary | ICD-10-CM | POA: Insufficient documentation

## 2014-07-29 DIAGNOSIS — F329 Major depressive disorder, single episode, unspecified: Secondary | ICD-10-CM | POA: Insufficient documentation

## 2014-07-29 DIAGNOSIS — F419 Anxiety disorder, unspecified: Secondary | ICD-10-CM | POA: Insufficient documentation

## 2014-07-29 DIAGNOSIS — Z79899 Other long term (current) drug therapy: Secondary | ICD-10-CM | POA: Insufficient documentation

## 2014-07-29 LAB — URINALYSIS, ROUTINE W REFLEX MICROSCOPIC
Bilirubin Urine: NEGATIVE
Glucose, UA: NEGATIVE mg/dL
Hgb urine dipstick: NEGATIVE
Ketones, ur: NEGATIVE mg/dL
Leukocytes, UA: NEGATIVE
Nitrite: NEGATIVE
Protein, ur: NEGATIVE mg/dL
Specific Gravity, Urine: 1.019 (ref 1.005–1.030)
Urobilinogen, UA: 0.2 mg/dL (ref 0.0–1.0)
pH: 6.5 (ref 5.0–8.0)

## 2014-07-29 LAB — RAPID URINE DRUG SCREEN, HOSP PERFORMED
Amphetamines: NOT DETECTED
Barbiturates: NOT DETECTED
Benzodiazepines: POSITIVE — AB
Cocaine: NOT DETECTED
Opiates: POSITIVE — AB
Tetrahydrocannabinol: POSITIVE — AB

## 2014-07-29 LAB — BASIC METABOLIC PANEL
Anion gap: 9 (ref 5–15)
BUN: 13 mg/dL (ref 6–20)
CO2: 26 mmol/L (ref 22–32)
Calcium: 9.6 mg/dL (ref 8.9–10.3)
Chloride: 104 mmol/L (ref 101–111)
Creatinine, Ser: 0.96 mg/dL (ref 0.61–1.24)
GFR calc Af Amer: >60 mL/min (ref >60)
GFR calc non Af Amer: >60 mL/min (ref >60)
Glucose, Bld: 91 mg/dL (ref 70–99)
Potassium: 4.1 mmol/L (ref 3.5–5.1)
Sodium: 139 mmol/L (ref 135–145)

## 2014-07-29 LAB — CBC WITH DIFFERENTIAL/PLATELET
Basophils Absolute: 0 10*3/uL (ref 0.0–0.1)
Basophils Relative: 0 % (ref 0–1)
Eosinophils Absolute: 0.1 10*3/uL (ref 0.0–0.7)
Eosinophils Relative: 2 % (ref 0–5)
HCT: 45.9 % (ref 39.0–52.0)
Hemoglobin: 15.8 g/dL (ref 13.0–17.0)
Lymphocytes Relative: 32 % (ref 12–46)
Lymphs Abs: 1.9 10*3/uL (ref 0.7–4.0)
MCH: 32.2 pg (ref 26.0–34.0)
MCHC: 34.4 g/dL (ref 30.0–36.0)
MCV: 93.5 fL (ref 78.0–100.0)
Monocytes Absolute: 0.6 10*3/uL (ref 0.1–1.0)
Monocytes Relative: 10 % (ref 3–12)
Neutro Abs: 3.3 10*3/uL (ref 1.7–7.7)
Neutrophils Relative %: 56 % (ref 43–77)
Platelets: 218 10*3/uL (ref 150–400)
RBC: 4.91 MIL/uL (ref 4.22–5.81)
RDW: 12.9 % (ref 11.5–15.5)
WBC: 6 10*3/uL (ref 4.0–10.5)

## 2014-07-29 LAB — I-STAT TROPONIN, ED
Troponin i, poc: 0 ng/mL (ref 0.00–0.08)
Troponin i, poc: 0 ng/mL (ref 0.00–0.08)

## 2014-07-29 LAB — D-DIMER, QUANTITATIVE: D-Dimer, Quant: <0.27 ug/mL-FEU (ref 0.00–0.48)

## 2014-07-29 MED ORDER — LORAZEPAM 2 MG/ML IJ SOLN
1.0000 mg | Freq: Once | INTRAMUSCULAR | Status: AC
  Administered 2014-07-29: 1 mg via INTRAVENOUS
  Filled 2014-07-29: qty 1

## 2014-07-29 MED ORDER — PANTOPRAZOLE SODIUM 20 MG PO TBEC: 20.0000 mg | DELAYED_RELEASE_TABLET | Freq: Every day | ORAL | Status: DC

## 2014-07-29 MED ORDER — MORPHINE SULFATE 4 MG/ML IJ SOLN
4.0000 mg | Freq: Once | INTRAMUSCULAR | Status: AC
  Administered 2014-07-29: 4 mg via INTRAVENOUS
  Filled 2014-07-29: qty 1

## 2014-07-29 MED ORDER — HYDROMORPHONE HCL 1 MG/ML IJ SOLN
1.0000 mg | Freq: Once | INTRAMUSCULAR | Status: AC
  Administered 2014-07-29: 1 mg via INTRAVENOUS
  Filled 2014-07-29: qty 1

## 2014-07-29 MED ORDER — HYDROCODONE-ACETAMINOPHEN 5-325 MG PO TABS: 1.0000 | ORAL_TABLET | Freq: Four times a day (QID) | ORAL | Status: DC | PRN

## 2014-07-29 MED ORDER — SUCRALFATE 1 G PO TABS: 1.0000 g | ORAL_TABLET | Freq: Three times a day (TID) | ORAL | Status: DC

## 2014-07-29 MED ORDER — IOHEXOL 350 MG/ML SOLN
80.0000 mL | Freq: Once | INTRAVENOUS | Status: AC | PRN
  Administered 2014-07-29: 80 mL via INTRAVENOUS

## 2014-07-29 MED ORDER — ASPIRIN 81 MG PO CHEW
324.0000 mg | CHEWABLE_TABLET | Freq: Once | ORAL | Status: AC
Start: 2014-07-29 — End: 2014-07-29
  Administered 2014-07-29: 324 mg via ORAL
  Filled 2014-07-29: qty 4

## 2014-07-29 MED ORDER — PANTOPRAZOLE SODIUM 40 MG PO TBEC
80.0000 mg | DELAYED_RELEASE_TABLET | Freq: Once | ORAL | Status: AC
  Administered 2014-07-29: 80 mg via ORAL
  Filled 2014-07-29: qty 2

## 2014-07-29 MED ORDER — FENTANYL CITRATE (PF) 100 MCG/2ML IJ SOLN
100.0000 ug | Freq: Once | INTRAMUSCULAR | Status: AC
  Administered 2014-07-29: 100 ug via INTRAVENOUS
  Filled 2014-07-29: qty 2

## 2014-07-29 NOTE — ED Provider Notes (Signed)
CSN: [id number]     Arrival date & time 07/29/14  0917 History   First MD Initiated Contact with Patient 07/29/14 [phone]     Chief Complaint  Patient presents with  . Chest Pain   Patient seen at 9:35 AM  (Consider location/radiation/quality/duration/timing/severity/associated sxs/prior Treatment) HPI Patient presents to the emergency department with chest pain that started this morning about an hour prior to arrival.  The patient states that the pain goes through to his back.  He states the pain keeps getting worse.  The pain is located in the central portion of his chest.  The patient states that it is hard to take deep breaths.  He states that leaning forward makes the pain better.  Patient states he is a one pack-a-day smoker.  Patient denies nausea, vomiting, diaphoresis, weakness, dizziness, headache, blurred vision, neck pain, cough, fever, abdominal pain, rash, or syncope.  Patient states that he has had chest pains in the past that were not as severe as this Past Medical History  Diagnosis Date  . Anxiety   . Depression   . PTSD (post-traumatic stress disorder)    Past Surgical History  Procedure Laterality Date  . Cholesterol     Family History  Problem Relation Age of Onset  . Cancer Mother   . Cancer Father   . Depression Father    History  Substance Use Topics  . Smoking status: Current Every Day Smoker -- 1.00 packs/day for 19 years    Types: Cigarettes  . Smokeless tobacco: Not on file  . Alcohol Use: No    Review of Systems  All other systems negative except as documented in the HPI. All pertinent positives and negatives as reviewed in the HPI.  Allergies  Mellaril  Home Medications   Prior to Admission medications   Medication Sig Start Date End Date Taking? Authorizing Provider  ARIPiprazole (ABILIFY) 15 MG tablet Take 0.5 tablets (7.5 mg total) by mouth at bedtime. For Depression/mood control 06/23/14   Sanjuana Kava, NP  carbamazepine (EQUETRO) 200 MG  CP12 12 hr capsule Take 1 capsule (200 mg total) by mouth 2 (two) times daily. Mood stabilization 06/23/14   Sanjuana Kava, NP  clonazePAM (KLONOPIN) 1 MG tablet Take 1 tablet (1 mg total) by mouth 4 (four) times daily. For severe anxiety 06/23/14   Sanjuana Kava, NP  cloNIDine (CATAPRES) 0.1 MG tablet Take 1 tablet (0.1 mg total) by mouth 2 (two) times daily. For high blood pressure 06/23/14   Sanjuana Kava, NP  famotidine (PEPCID) 20 MG tablet Take 1 tablet (20 mg total) by mouth 2 (two) times daily. For acid reflux 06/23/14   Sanjuana Kava, NP  furosemide (LASIX) 20 MG tablet Take 1 tablet (20 mg total) by mouth daily. For swellings 06/23/14   Sanjuana Kava, NP  gabapentin (NEURONTIN) 300 MG capsule Take 1 capsule (300 mg total) by mouth 3 (three) times daily. For agitation 06/23/14   Sanjuana Kava, NP  hydrOXYzine (ATARAX/VISTARIL) 25 MG tablet Take 1 tablet (25 mg total) by mouth every 6 (six) hours as needed for anxiety. 06/23/14   Sanjuana Kava, NP  nicotine (NICODERM CQ - DOSED IN MG/24 HOURS) 21 mg/24hr patch Place 1 patch (21 mg total) onto the skin daily. For nicotine addiction 06/23/14   Sanjuana Kava, NP  potassium chloride (K-DUR,KLOR-CON) 10 MEQ tablet Take 1 tablet (10 mEq total) by mouth daily. For low potassium 06/23/14   Sanjuana Kava,  NP  prazosin (MINIPRESS) 1 MG capsule Take 3 capsules (3 mg total) by mouth at bedtime. For nightmares 06/23/14   Sanjuana KavaAgnes I Nwoko, NP  testosterone cypionate (DEPOTESTOTERONE CYPIONATE) 200 MG/ML injection Inject 0.5 mLs (100 mg total) into the muscle every 14 (fourteen) days. For low testosterone replacement 06/23/14   Sanjuana KavaAgnes I Nwoko, NP  zolpidem (AMBIEN) 10 MG tablet Take 1 tablet (10 mg total) by mouth at bedtime as needed for sleep. 06/23/14 07/23/14  Sanjuana KavaAgnes I Nwoko, NP   BP 138/87 mmHg  Temp(Src) 97.9 F (36.6 C) (Oral)  Resp 12  Ht 5\' 10"  (1.778 m)  Wt 175 lb (79.379 kg)  BMI 25.11 kg/m2  SpO2 97% Physical Exam  Constitutional: He is oriented to person,  place, and time. He appears well-developed and well-nourished. No distress.  HENT:  Head: Normocephalic and atraumatic.  Mouth/Throat: Oropharynx is clear and moist.  Eyes: Pupils are equal, round, and reactive to light.  Neck: Normal range of motion. Neck supple.  Cardiovascular: Normal rate, regular rhythm and normal heart sounds.  Exam reveals no gallop and no friction rub.   No murmur heard. Pulmonary/Chest: Effort normal and breath sounds normal. No respiratory distress.  Musculoskeletal: He exhibits no edema.  Neurological: He is alert and oriented to person, place, and time. He exhibits normal muscle tone. Coordination normal.  Skin: Skin is warm and dry. No rash noted. No erythema.  Psychiatric: His behavior is normal. His mood appears anxious.  Nursing note and vitals reviewed.   ED Course  Procedures (including critical care time) Labs Review Labs Reviewed  URINE RAPID DRUG SCREEN (HOSP PERFORMED) - Abnormal; Notable for the following:    Opiates POSITIVE (*)    Benzodiazepines POSITIVE (*)    Tetrahydrocannabinol POSITIVE (*)    All other components within normal limits  BASIC METABOLIC PANEL  CBC WITH DIFFERENTIAL/PLATELET  D-DIMER, QUANTITATIVE  URINALYSIS, ROUTINE W REFLEX MICROSCOPIC  I-STAT TROPOININ, ED  I-STAT TROPOININ, ED    Imaging Review Dg Chest 2 View  07/29/2014   CLINICAL DATA:  Chest pain starting this morning  EXAM: CHEST  2 VIEW  COMPARISON:  05/23/2014  FINDINGS: Cardiomediastinal silhouette is stable. No acute infiltrate or pleural effusion. No pulmonary edema. Bony thorax is unremarkable.  IMPRESSION: No active cardiopulmonary disease.   Electronically Signed   By: Natasha MeadLiviu  Pop M.D.   On: 07/29/2014 11:32   Ct Angio Chest Pe W/cm &/or Wo Cm  07/29/2014   CLINICAL DATA:  Chest discomfort.  Evaluate for pulmonary embolism.  EXAM: CT ANGIOGRAPHY CHEST WITH CONTRAST  TECHNIQUE: Multidetector CT imaging of the chest was performed using the standard  protocol during bolus administration of intravenous contrast. Multiplanar CT image reconstructions and MIPs were obtained to evaluate the vascular anatomy.  CONTRAST:  80mL OMNIPAQUE IOHEXOL 350 MG/ML SOLN  COMPARISON:  Chest x-ray 8 07/29/2014  FINDINGS: Negative for pulmonary embolism. Negative for aortic aneurysm or dissection. Heart size is upper normal. Negative for pericardial effusion. No significant coronary calcification.  Mild atelectasis in the lung bases. 3 mm right upper lobe nodule. Mild scarring in the lingula. No mass or adenopathy in the chest.  Negative thoracic spine.  Negative upper abdomen.  Review of the MIP images confirms the above findings.  IMPRESSION: Negative for pulmonary embolism.  No acute abnormality.  3 mm right upper lobe nodule most likely benign. No further evaluation necessary unless the patient has a known malignancy. If the patient is at high risk for bronchogenic carcinoma, follow-up  chest CT at 1 year is recommended. If the patient is at low risk, no follow-up is needed. This recommendation follows the consensus statement: Guidelines for Management of Small Pulmonary Nodules Detected on CT Scans: A Statement from the Fleischner Society as published in Radiology 2005; 237:395-400.   Electronically Signed   By: Marlan Palauharles  Clark M.D.   On: 07/29/2014 14:18   The patient has no identifiable cause for this chest pain.  He said 2 sets of negative troponins also a negative CTA of his chest, but does not indicate any aortic injury or pericardial issues or pulmonary embolus.  The patient will be treated with pain medication a PPI and Carafate.  He is advised to follow-up with his primary care Dr. told to return here as needed.  MDM   Final diagnoses:  None      Charlestine NightChristopher Lalia Loudon, PA-C 07/29/14 1440  Rolland PorterMark James, MD 08/02/14 [phone]

## 2014-07-29 NOTE — ED Notes (Signed)
Pt returned from xray-- family at bedside, pt tensing up muscles in arms, holding onto chest stating "I can't do this anymore-- I want to lay down on the floor" explained to pt that he needed to stay in the bed.

## 2014-07-29 NOTE — ED Notes (Signed)
Pt c/o central chest pain that started around 845 this morning that radiates toward back, sob. Pt sts he was putting out pine needles when the chest pain started. Pt describes pain as heavy burnning and constant.

## 2014-07-29 NOTE — ED Notes (Signed)
Pt encouraged to lean back in bed, still gripping at chest  Stating "It feels better if I put pressure on it" family at bedside. Pt keeps eyes closed when talking to staff.

## 2014-07-29 NOTE — Discharge Instructions (Signed)
Your x-rays did not show any significant abnormalities.  Lab testing did not indicate any heart-related issues.  Follow-up with your primary care Dr. for recheck.

## 2014-08-12 ENCOUNTER — Emergency Department (HOSPITAL_COMMUNITY)
Admission: EM | Admit: 2014-08-12 | Discharge: 2014-08-12 | Disposition: A | Discharge status: Home / Self Care | Payer: Medicaid Other | Attending: Emergency Medicine | Admitting: Emergency Medicine

## 2014-08-12 ENCOUNTER — Emergency Department (HOSPITAL_COMMUNITY): Payer: Medicaid Other

## 2014-08-12 ENCOUNTER — Encounter (HOSPITAL_COMMUNITY): Payer: Self-pay

## 2014-08-12 DIAGNOSIS — F4310 Post-traumatic stress disorder, unspecified: Secondary | ICD-10-CM | POA: Insufficient documentation

## 2014-08-12 DIAGNOSIS — W010XXA Fall on same level from slipping, tripping and stumbling without subsequent striking against object, initial encounter: Secondary | ICD-10-CM | POA: Diagnosis not present

## 2014-08-12 DIAGNOSIS — Y9301 Activity, walking, marching and hiking: Secondary | ICD-10-CM | POA: Diagnosis not present

## 2014-08-12 DIAGNOSIS — Y998 Other external cause status: Secondary | ICD-10-CM | POA: Diagnosis not present

## 2014-08-12 DIAGNOSIS — S4992XA Unspecified injury of left shoulder and upper arm, initial encounter: Secondary | ICD-10-CM | POA: Diagnosis present

## 2014-08-12 DIAGNOSIS — Z79899 Other long term (current) drug therapy: Secondary | ICD-10-CM | POA: Insufficient documentation

## 2014-08-12 DIAGNOSIS — Y9289 Other specified places as the place of occurrence of the external cause: Secondary | ICD-10-CM | POA: Insufficient documentation

## 2014-08-12 DIAGNOSIS — Y9389 Activity, other specified: Secondary | ICD-10-CM | POA: Insufficient documentation

## 2014-08-12 DIAGNOSIS — Z720 Tobacco use: Secondary | ICD-10-CM | POA: Diagnosis not present

## 2014-08-12 DIAGNOSIS — M25512 Pain in left shoulder: Secondary | ICD-10-CM

## 2014-08-12 DIAGNOSIS — F419 Anxiety disorder, unspecified: Secondary | ICD-10-CM | POA: Insufficient documentation

## 2014-08-12 DIAGNOSIS — F329 Major depressive disorder, single episode, unspecified: Secondary | ICD-10-CM | POA: Diagnosis not present

## 2014-08-12 MED ORDER — METHOCARBAMOL 500 MG PO TABS: 500.0000 mg | ORAL_TABLET | Freq: Two times a day (BID) | ORAL | Status: DC

## 2014-08-12 MED ORDER — IBUPROFEN 400 MG PO TABS: 400.0000 mg | ORAL_TABLET | Freq: Four times a day (QID) | ORAL | Status: DC | PRN

## 2014-08-12 MED ORDER — OXYCODONE-ACETAMINOPHEN 5-325 MG PO TABS
1.0000 | ORAL_TABLET | Freq: Once | ORAL | Status: AC
  Administered 2014-08-12: 1 via ORAL
  Filled 2014-08-12: qty 1

## 2014-08-12 NOTE — Discharge Instructions (Signed)
Please use ibuprofen and Robaxin as needed for spasms and pain. Please follow-up with your primary care provider in one week if symptoms continue to persist, follow-up sooner as needed.

## 2014-08-12 NOTE — ED Notes (Addendum)
Pt presents with c/o left arm injury after a fall that occurred last night. Pt reports he fell on his arm and is unable to move the arm without pain. Pt reports the pain is all the way up his arm into his neck as well. Pt also reporting nausea today as well.

## 2014-08-12 NOTE — ED Provider Notes (Signed)
CSN: [id number]     Arrival date & time 08/12/14  1429 History   First MD Initiated Contact with Patient 08/12/14 1618     Chief Complaint  Patient presents with  . Arm Injury  . Nausea   HPI   44 year old male presents with left elbow/ shoulder pain. Patient reports that he was walking when he tripped over his sandal last night landing on his left elbow in the grass, reports immediate pain to his elbow and shoulder at that time. Patient reports the pain is continued through the night and morning today, he is attempted using ibuprofen with little result of symptoms. Patient reports that the pain radiates up into his neck, and down into his distal extremity. Patient reports associated nausea, no vomiting with the pain. Patient is right-hand dominant, denies injury to the left shoulder elbow or wrist. Denies any signs of trauma to the extremity. He reports difficulty with flexion and extension abduction due to pain, no obvious deformities or swelling noted. Denies any other injuries from the fall.   Past Medical History  Diagnosis Date  . Anxiety   . Depression   . PTSD (post-traumatic stress disorder)    Past Surgical History  Procedure Laterality Date  . Cholesterol     Family History  Problem Relation Age of Onset  . Cancer Mother   . Cancer Father   . Depression Father    History  Substance Use Topics  . Smoking status: Current Every Day Smoker -- 1.00 packs/day for 19 years    Types: Cigarettes  . Smokeless tobacco: Not on file  . Alcohol Use: No    Review of Systems  All other systems reviewed and are negative.   Allergies  Mellaril  Home Medications   Prior to Admission medications   Medication Sig Start Date End Date Taking? Authorizing Provider  ARIPiprazole (ABILIFY) 15 MG tablet Take 0.5 tablets (7.5 mg total) by mouth at bedtime. For Depression/mood control 06/23/14   Sanjuana KavaAgnes I Nwoko, NP  carbamazepine (EQUETRO) 200 MG CP12 12 hr capsule Take 1 capsule (200 mg  total) by mouth 2 (two) times daily. Mood stabilization 06/23/14   Sanjuana KavaAgnes I Nwoko, NP  clonazePAM (KLONOPIN) 1 MG tablet Take 1 tablet (1 mg total) by mouth 4 (four) times daily. For severe anxiety 06/23/14   Sanjuana KavaAgnes I Nwoko, NP  cloNIDine (CATAPRES) 0.1 MG tablet Take 1 tablet (0.1 mg total) by mouth 2 (two) times daily. For high blood pressure 06/23/14   Sanjuana KavaAgnes I Nwoko, NP  famotidine (PEPCID) 20 MG tablet Take 1 tablet (20 mg total) by mouth 2 (two) times daily. For acid reflux 06/23/14   Sanjuana KavaAgnes I Nwoko, NP  furosemide (LASIX) 20 MG tablet Take 1 tablet (20 mg total) by mouth daily. For swellings 06/23/14   Sanjuana KavaAgnes I Nwoko, NP  gabapentin (NEURONTIN) 300 MG capsule Take 1 capsule (300 mg total) by mouth 3 (three) times daily. For agitation 06/23/14   Sanjuana KavaAgnes I Nwoko, NP  hydrOXYzine (ATARAX/VISTARIL) 25 MG tablet Take 1 tablet (25 mg total) by mouth every 6 (six) hours as needed for anxiety. 06/23/14   Sanjuana KavaAgnes I Nwoko, NP  nicotine (NICODERM CQ - DOSED IN MG/24 HOURS) 21 mg/24hr patch Place 1 patch (21 mg total) onto the skin daily. For nicotine addiction 06/23/14   Sanjuana KavaAgnes I Nwoko, NP  pantoprazole (PROTONIX) 20 MG tablet Take 1 tablet (20 mg total) by mouth daily. 07/29/14   Charlestine Nighthristopher Lawyer, PA-C  potassium chloride (K-DUR,KLOR-CON) 10 MEQ tablet  Take 1 tablet (10 mEq total) by mouth daily. For low potassium 06/23/14   Sanjuana KavaAgnes I Nwoko, NP  prazosin (MINIPRESS) 1 MG capsule Take 3 capsules (3 mg total) by mouth at bedtime. For nightmares 06/23/14   Sanjuana KavaAgnes I Nwoko, NP  sucralfate (CARAFATE) 1 G tablet Take 1 tablet (1 g total) by mouth 4 (four) times daily -  with meals and at bedtime. 07/29/14   Charlestine Nighthristopher Lawyer, PA-C  testosterone cypionate (DEPOTESTOTERONE CYPIONATE) 200 MG/ML injection Inject 0.5 mLs (100 mg total) into the muscle every 14 (fourteen) days. For low testosterone replacement Patient not taking: Reported on 07/29/2014 06/23/14   Sanjuana KavaAgnes I Nwoko, NP  zolpidem (AMBIEN) 10 MG tablet Take 1 tablet (10 mg total) by  mouth at bedtime as needed for sleep. 06/23/14 07/23/14  Sanjuana KavaAgnes I Nwoko, NP   BP 136/93 mmHg  Pulse 68  Temp(Src) 98 F (36.7 C) (Oral)  Resp 18  SpO2 98%   Physical Exam  Constitutional: He is oriented to person, place, and time. He appears well-developed and well-nourished.  HENT:  Head: Normocephalic and atraumatic.  Eyes: Pupils are equal, round, and reactive to light.  Neck: Normal range of motion. Neck supple. No JVD present. No spinous process tenderness present.  Cardiovascular: Normal rate, regular rhythm, normal heart sounds and intact distal pulses.  Exam reveals no gallop and no friction rub.   No murmur heard. Pulmonary/Chest: Effort normal and breath sounds normal. No respiratory distress. He has no wheezes. He has no rales. He exhibits no tenderness.  Musculoskeletal: Normal range of motion.  Shoulder symmetrical bilaterally, no signs of deformity, swelling, trauma. Elbows atraumatic, symmetrical, no signs of trauma. Distal radial pulses intact, she reports decreased sensation to distal left fingers, full strength function are perfusion intact. Difficult shoulder exam due to patient pain and cooperation. Reduced range of motion in all directions.  Neurological: He is alert and oriented to person, place, and time. Coordination normal.  Skin: Skin is warm and dry.  Psychiatric: He has a normal mood and affect. His behavior is normal. Judgment and thought content normal.  Nursing note and vitals reviewed.   ED Course  Procedures (including critical care time) Labs Review Labs Reviewed - No data to display  Imaging Review No results found.   EKG Interpretation None      MDM   Final diagnoses:  Left shoulder pain    Labs: none  Imaging: DG shoulder left negative  Consults: none  Therapeutics: oxycodone   Assessment: left shoulder pain  Plan: Patient presents with left shoulder pain status post fall. He reports falling onto grass, no signs of trauma to the  extremity that would indicate significant force or trauma. Shoulders symmetrical, negative DG shoulder films, patient given Percocet with improvement in symptoms. Patient was discharged home with instructions to use ibuprofen as needed for pain, rice instructions, and follow-up with his primary care provider for further evaluation and management of symptoms continue to persist. She verbalizes understanding and agreement for today's plan and had no further questions or comments.      Eyvonne MechanicJeffrey Jahziah Simonin, PA-C 08/13/14 1709  Purvis SheffieldForrest Harrison, MD 08/13/14 [phone]

## 2014-09-16 ENCOUNTER — Emergency Department (HOSPITAL_COMMUNITY)
Admission: EM | Admit: 2014-09-16 | Discharge: 2014-09-19 | Disposition: A | Discharge status: Home / Self Care | Payer: Medicaid Other | Attending: Emergency Medicine | Admitting: Emergency Medicine

## 2014-09-16 ENCOUNTER — Encounter (HOSPITAL_COMMUNITY): Payer: Self-pay | Admitting: *Deleted

## 2014-09-16 DIAGNOSIS — R45851 Suicidal ideations: Secondary | ICD-10-CM | POA: Diagnosis present

## 2014-09-16 DIAGNOSIS — F4310 Post-traumatic stress disorder, unspecified: Secondary | ICD-10-CM | POA: Diagnosis not present

## 2014-09-16 DIAGNOSIS — Z79899 Other long term (current) drug therapy: Secondary | ICD-10-CM | POA: Insufficient documentation

## 2014-09-16 DIAGNOSIS — Z791 Long term (current) use of non-steroidal anti-inflammatories (NSAID): Secondary | ICD-10-CM | POA: Diagnosis not present

## 2014-09-16 DIAGNOSIS — F411 Generalized anxiety disorder: Secondary | ICD-10-CM | POA: Diagnosis present

## 2014-09-16 DIAGNOSIS — F332 Major depressive disorder, recurrent severe without psychotic features: Secondary | ICD-10-CM | POA: Diagnosis not present

## 2014-09-16 DIAGNOSIS — Z720 Tobacco use: Secondary | ICD-10-CM | POA: Diagnosis not present

## 2014-09-16 DIAGNOSIS — F1310 Sedative, hypnotic or anxiolytic abuse, uncomplicated: Secondary | ICD-10-CM | POA: Insufficient documentation

## 2014-09-16 LAB — COMPREHENSIVE METABOLIC PANEL
ALK PHOS: 102 U/L (ref 38–126)
ALT: 40 U/L (ref 17–63)
ANION GAP: 7 (ref 5–15)
AST: 27 U/L (ref 15–41)
Albumin: 3.9 g/dL (ref 3.5–5.0)
BILIRUBIN TOTAL: 0.4 mg/dL (ref 0.3–1.2)
BUN: 15 mg/dL (ref 6–20)
CO2: 27 mmol/L (ref 22–32)
CREATININE: 0.91 mg/dL (ref 0.61–1.24)
Calcium: 9.3 mg/dL (ref 8.9–10.3)
Chloride: 106 mmol/L (ref 101–111)
GFR calc Af Amer: >60 mL/min (ref >60)
GFR calc non Af Amer: >60 mL/min (ref >60)
Glucose, Bld: 101 mg/dL — ABNORMAL HIGH (ref 65–99)
Potassium: 4.2 mmol/L (ref 3.5–5.1)
Sodium: 140 mmol/L (ref 135–145)
Total Protein: 6.3 g/dL — ABNORMAL LOW (ref 6.5–8.1)

## 2014-09-16 LAB — CBC
HEMATOCRIT: 45.6 % (ref 39.0–52.0)
Hemoglobin: 15.5 g/dL (ref 13.0–17.0)
MCH: 32 pg (ref 26.0–34.0)
MCHC: 34 g/dL (ref 30.0–36.0)
MCV: 94.2 fL (ref 78.0–100.0)
PLATELETS: 201 10*3/uL (ref 150–400)
RBC: 4.84 MIL/uL (ref 4.22–5.81)
RDW: 12.6 % (ref 11.5–15.5)
WBC: 6 10*3/uL (ref 4.0–10.5)

## 2014-09-16 LAB — SALICYLATE LEVEL

## 2014-09-16 LAB — RAPID URINE DRUG SCREEN, HOSP PERFORMED
AMPHETAMINES: NOT DETECTED
BENZODIAZEPINES: POSITIVE — AB
Barbiturates: NOT DETECTED
COCAINE: NOT DETECTED
Opiates: NOT DETECTED
Tetrahydrocannabinol: NOT DETECTED

## 2014-09-16 LAB — ETHANOL: Alcohol, Ethyl (B): <5 mg/dL (ref <5)

## 2014-09-16 LAB — ACETAMINOPHEN LEVEL: Acetaminophen (Tylenol), Serum: <10 ug/mL — ABNORMAL LOW (ref 10–30)

## 2014-09-16 MED ORDER — GABAPENTIN 300 MG PO CAPS
300.0000 mg | ORAL_CAPSULE | Freq: Three times a day (TID) | ORAL | Status: DC
  Administered 2014-09-16 – 2014-09-19 (×8): 300 mg via ORAL
  Filled 2014-09-16 (×8): qty 1

## 2014-09-16 MED ORDER — ONDANSETRON HCL 4 MG PO TABS: 4.0000 mg | ORAL_TABLET | Freq: Three times a day (TID) | ORAL | Status: DC | PRN

## 2014-09-16 MED ORDER — ZOLPIDEM TARTRATE 5 MG PO TABS
5.0000 mg | ORAL_TABLET | Freq: Every evening | ORAL | Status: DC | PRN
  Administered 2014-09-16 – 2014-09-18 (×3): 5 mg via ORAL
  Filled 2014-09-16 (×3): qty 1

## 2014-09-16 MED ORDER — IBUPROFEN 400 MG PO TABS: 600.0000 mg | ORAL_TABLET | Freq: Three times a day (TID) | ORAL | Status: DC | PRN

## 2014-09-16 MED ORDER — ACETAMINOPHEN 325 MG PO TABS: 650.0000 mg | ORAL_TABLET | ORAL | Status: DC | PRN

## 2014-09-16 MED ORDER — LORAZEPAM 1 MG PO TABS
1.0000 mg | ORAL_TABLET | Freq: Three times a day (TID) | ORAL | Status: DC | PRN
  Administered 2014-09-16 – 2014-09-17 (×3): 1 mg via ORAL
  Filled 2014-09-16 (×4): qty 1

## 2014-09-16 MED ORDER — PRAZOSIN HCL 2 MG PO CAPS
3.0000 mg | ORAL_CAPSULE | Freq: Every day | ORAL | Status: DC
  Administered 2014-09-16 – 2014-09-18 (×3): 3 mg via ORAL
  Filled 2014-09-16 (×5): qty 1

## 2014-09-16 MED ORDER — NICOTINE 21 MG/24HR TD PT24
21.0000 mg | MEDICATED_PATCH | Freq: Every day | TRANSDERMAL | Status: DC
  Administered 2014-09-16 – 2014-09-19 (×4): 21 mg via TRANSDERMAL
  Filled 2014-09-16 (×4): qty 1

## 2014-09-16 NOTE — ED Notes (Signed)
Called for sitter ?

## 2014-09-16 NOTE — ED Notes (Signed)
Pt placed in scrubs, security at bedside to wand pt. Pt belongings bagged and labeled and placed at nurse station.
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
## 2014-09-16 NOTE — Progress Notes (Signed)
CSW seeking inpatient Psych bed(s) for patient.   Referred to: [facility] 1st Cleot[facility] Lema [facility]  At Capacity:   HH 1st [facility], MSW, [place]  [phone]

## 2014-09-16 NOTE — ED Notes (Signed)
Pt in c/o SI, pt quiet and not answering many questions during triage, states he has been having these thoughts for a few months, denies substance use

## 2014-09-16 NOTE — ED Notes (Signed)
This RN went through patient belongings found note that sts people names and type of gun they have and phone number.

## 2014-09-16 NOTE — ED Notes (Signed)
Report received from [place], [place].  Pt to be moved to [facility]

## 2014-09-16 NOTE — ED Notes (Signed)
Pt. Received coke and declined a nighttime snack.
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
## 2014-09-16 NOTE — ED Notes (Signed)
Pt arrives with therapist for eval of increases depression and Suicidal ideation. Pt states recently seen at [facility] for the same and was discharged but depression has worsened. Pt denies any plan at this time to harm self. Pt giving minimal info during this assessment. Pt calm and cooperative. nad noted.

## 2014-09-16 NOTE — ED Notes (Signed)
TTS consult in progress. °

## 2014-09-17 MED ORDER — CARBAMAZEPINE 200 MG PO TABS
200.0000 mg | ORAL_TABLET | Freq: Two times a day (BID) | ORAL | Status: DC
  Administered 2014-09-17 – 2014-09-19 (×4): 200 mg via ORAL
  Filled 2014-09-17 (×4): qty 1

## 2014-09-17 MED ORDER — CHLORPROMAZINE HCL 25 MG PO TABS
100.0000 mg | ORAL_TABLET | Freq: Every day | ORAL | Status: DC
  Administered 2014-09-17 – 2014-09-18 (×2): 100 mg via ORAL
  Filled 2014-09-17 (×2): qty 4

## 2014-09-17 MED ORDER — CLONAZEPAM 0.5 MG PO TABS
1.0000 mg | ORAL_TABLET | Freq: Four times a day (QID) | ORAL | Status: DC
  Administered 2014-09-18 – 2014-09-19 (×6): 1 mg via ORAL
  Filled 2014-09-17 (×6): qty 2

## 2014-09-17 MED ORDER — CLONAZEPAM 0.5 MG PO TABS: 1.0000 mg | ORAL_TABLET | Freq: Four times a day (QID) | ORAL | Status: DC

## 2014-09-17 MED ORDER — ARIPIPRAZOLE 10 MG PO TABS: 15.0000 mg | ORAL_TABLET | Freq: Every day | ORAL | Status: DC

## 2014-09-17 MED ORDER — ARIPIPRAZOLE 10 MG PO TABS
15.0000 mg | ORAL_TABLET | Freq: Every day | ORAL | Status: DC
  Administered 2014-09-17 – 2014-09-18 (×2): 15 mg via ORAL
  Filled 2014-09-17 (×2): qty 2

## 2014-09-17 MED ORDER — CLONAZEPAM 0.5 MG PO TABS
1.0000 mg | ORAL_TABLET | Freq: Once | ORAL | Status: AC
  Administered 2014-09-17: 1 mg via ORAL
  Filled 2014-09-17: qty 2

## 2014-09-17 NOTE — Progress Notes (Signed)
CSW spoke with Tom at [facility] who reports they have availability and reqesting I refax referral as he does not see it. Clinicals refaxed and confirmed by phone with Tom that he has rec'd and is reviewing. He will advise- Erminio Nygard, MSW, LCSWA  [phone]    

## 2014-09-17 NOTE — ED Notes (Signed)
IV[facility] paperwork served; copies in the chart.

## 2014-09-17 NOTE — ED Notes (Signed)
Pt pleasantly refusing medications at this time

## 2014-09-17 NOTE — ED Notes (Signed)
Contacted magistrate's office to confirm IVC papers were received; IVC paperwork in process

## 2014-09-17 NOTE — ED Notes (Signed)
IVC paperwork filled out by Dr.Wentz and faxed to [facility] office

## 2014-09-17 NOTE — ED Notes (Signed)
Pt upset about not receiving Klonopin medication; communicating to sitter about "staying out of his way" and asking "what happens if I'm not good? What will happen if I act bad." Security called to area for flight risk for patient. Clydie Braun RN spoke with Dr.Wentz concerning medication; awaiting TTS to determine needed medication orders

## 2014-09-17 NOTE — ED Notes (Signed)
Dr. Wentz at bedside. 

## 2014-09-17 NOTE — ED Notes (Addendum)
Re-faxed IVC papers to Magistrate's office at [phone]; magistrate has not received papers

## 2014-09-17 NOTE — ED Notes (Signed)
Pt ate all his breakfast and is now sleeping with sitter at bedside

## 2014-09-17 NOTE — ED Provider Notes (Signed)
16:50- patient evaluated after he informed nursing that he would kill himself if he left. At this time, TTS, is attempting to place him. The patient's exact acquired medication treatments are unclear. TTS has been consulted to give medicine recommendations. At this time. The patient states that he continues to be suicidal and that he will kill himself, if he is discharged from the hospital. He does not specify a specific plan. He asked that we "forget" him.   IVC paperwork filled out and signed  Patient will need to be placed in a psychiatric facility for his protection.  Mancel Bale, MD 09/17/14 [phone]

## 2014-09-17 NOTE — ED Notes (Signed)
Drinks and snacks provided.

## 2014-09-17 NOTE — ED Notes (Signed)
Pt lying in bed, staring at the wall with sitter at bedside; requesting klonopin; informed patient of no orders. Had spoke to Dr.Wentz previously concerning pts request, no orders were placed. Pt c/o "I don't feel good;" unable to describe what's wrong. Pt appears anxious, restless, and slightly diaphoretic. Wants to know why "I've been on klonopin for 5 years and I take it 4 times a day, now I can't get it." Pt awaiting a psych consult for medications. Pt stated to this RN that "I've got to leave." Informed pt of IVC status; states "I don't care in about 5 minutes I have to get out here I don't care what happens." Pt also told sitter "to move out of the way when I leave." Pt also stating "it's too loud here, it's a lot of booms and beeping." (hx of ptsd from [employer] experiences).   Spoke to Dr.Pickering regarding klonopin; placed an order for medication. Pt willing to accept one time dose, is cooperative, and thankful after medication. [employer] and security on stand by for possible flight risk with patient.

## 2014-09-17 NOTE — Consult Note (Signed)
Telepsych Consultation   Reason for Consult: Medication  Referring Physician:  Dr. Eulis Foster Patient Identification: Phillip [name] MRN:  [id number] Principal Diagnosis: <principal problem not specified> Diagnosis:   Patient Active Problem List   Diagnosis Date Noted  . Major depression [F32.2] 06/06/2014  . Suicidal ideations [R45.851] 06/05/2014  . Panic disorder [F41.0] 05/19/2014  . MDD (major depressive disorder), recurrent, severe, with psychosis [F33.3] 05/18/2014  . PTSD (post-traumatic stress disorder) [F43.10] 01/29/2014  . Generalized anxiety disorder [F41.1] 06/19/2011    Class: Chronic    Total Time spent with patient: 35minutes: 2155  Subjective:   [name] is a 44 y.o. male patient.  HPI:  Phillip [name] is an 44 y.o. male. Pt arrived voluntarily to [facility]. Pt reports SI with a plan. Pt denies HI. Pt denies AVH. Pt reports that he has a plan to shoot himself with a gun. Pt states that he does not have a gun but he can find one. Pt states that he has been diagnosed with Depression, PTSD, and Agoraphobia. Pt reports previous inpatient treatment at [facility] and [facility]. Pt states that he is currently receiving outpatient therapy from his counselor Ivin Booty. Pt states that his current PCP prescribes his medication. Pt is prescribed Thorazine, Klonopin, Abilify, Carbamazepine, Thorazine, Gabapentin and Prozosine. According to the Pt, he began counseling when he was discharged from [facility] 3 months ago. Pt reports previous marijuana use. Pt denies alcohol use.  Per record reviewed, Patient non compliant with medication since discharge from [facility] and repeatedly communicates to sitter "if I'm released, I'm going to kill my self, I don't care". Patient was IVCed by Dr. Daleen Bo.  Patient was documented having some withdrawal symptoms-anxious, restless,  slight diaphoretic, and threatening to leave the hospital but became calm once a dose of klonopin was administered.    HPI  Elements:   Location: MDD, PTSD       Quality: Several days of suicidal ideations      Severity: Chronic suicidal ideations, PTSD, MDD       Timing: Every day for the last couple of days      Duration: Last 4 days and a few other times in last 3 months      Context:  PTSD, continuous SI  Past Medical History:  Past Medical History  Diagnosis Date  . Anxiety   . Depression   . PTSD (post-traumatic stress disorder)     Past Surgical History  Procedure Laterality Date  . Cholesterol     Family History:  Family History  Problem Relation Age of Onset  . Cancer Mother   . Cancer Father   . Depression Father    Social History:  History  Alcohol Use No     History  Drug Use No    Comment: denies    History   Social History  . Marital Status: Married    Spouse Name: N/A  . Number of Children: N/A  . Years of Education: N/A   Social History Main Topics  . Smoking status: Current Every Day Smoker -- 1.00 packs/day for 19 years    Types: Cigarettes  . Smokeless tobacco: Not on file  . Alcohol Use: No  . Drug Use: No     Comment: denies  . Sexual Activity: Yes    Birth Control/ Protection: None   Other Topics Concern  . None   Social History Narrative   Additional Social History:    Pain Medications: Pt denies Prescriptions: Thorazine, Klonopine,  Gabapentin Over the Counter: Pt denies History of alcohol / drug use?: Yes Longest period of sobriety (when/how long): Unknown Name of Substance 1: Marijuana 1 - Age of First Use: 21 1 - Amount (size/oz): "a joint" 1 - Frequency: occasional 1 - Duration: ongoing 1 - Last Use / Amount: a week ago                   Allergies:   Allergies  Allergen Reactions  . Mellaril [Thioridazine] Swelling    Bilateral leg swelling    Labs:  Results for orders placed or performed during the hospital encounter of 09/16/14 (from the past 48 hour(s))  Acetaminophen level     Status: Abnormal   Collection Time: 09/16/14  11:11 AM  Result Value Ref Range   Acetaminophen (Tylenol), Serum <10 (L) 10 - 30 ug/mL    Comment:        THERAPEUTIC CONCENTRATIONS VARY SIGNIFICANTLY. A RANGE OF 10-30 ug/mL MAY BE AN EFFECTIVE CONCENTRATION FOR MANY PATIENTS. HOWEVER, SOME ARE BEST TREATED AT CONCENTRATIONS OUTSIDE THIS RANGE. ACETAMINOPHEN CONCENTRATIONS >150 ug/mL AT 4 HOURS AFTER INGESTION AND >50 ug/mL AT 12 HOURS AFTER INGESTION ARE OFTEN ASSOCIATED WITH TOXIC REACTIONS.   CBC     Status: None   Collection Time: 09/16/14 11:11 AM  Result Value Ref Range   WBC 6.0 4.0 - 10.5 K/uL   RBC 4.84 4.22 - 5.81 MIL/uL   Hemoglobin 15.5 13.0 - 17.0 g/dL   HCT 45.6 39.0 - 52.0 %   MCV 94.2 78.0 - 100.0 fL   MCH 32.0 26.0 - 34.0 pg   MCHC 34.0 30.0 - 36.0 g/dL   RDW 12.6 11.5 - 15.5 %   Platelets 201 150 - 400 K/uL  Comprehensive metabolic panel     Status: Abnormal   Collection Time: 09/16/14 11:11 AM  Result Value Ref Range   Sodium 140 135 - 145 mmol/L   Potassium 4.2 3.5 - 5.1 mmol/L   Chloride 106 101 - 111 mmol/L   CO2 27 22 - 32 mmol/L   Glucose, Bld 101 (H) 65 - 99 mg/dL   BUN 15 6 - 20 mg/dL   Creatinine, Ser 0.91 0.61 - 1.24 mg/dL   Calcium 9.3 8.9 - 10.3 mg/dL   Total Protein 6.3 (L) 6.5 - 8.1 g/dL   Albumin 3.9 3.5 - 5.0 g/dL   AST 27 15 - 41 U/L   ALT 40 17 - 63 U/L   Alkaline Phosphatase 102 38 - 126 U/L   Total Bilirubin 0.4 0.3 - 1.2 mg/dL   GFR calc non Af Amer >60 >60 mL/min   GFR calc Af Amer >60 >60 mL/min    Comment: (NOTE) The eGFR has been calculated using the CKD EPI equation. This calculation has not been validated in all clinical situations. eGFR's persistently <60 mL/min signify possible Chronic Kidney Disease.    Anion gap 7 5 - 15  Ethanol (ETOH)     Status: None   Collection Time: 09/16/14 11:11 AM  Result Value Ref Range   Alcohol, Ethyl (B) <5 <5 mg/dL    Comment:        LOWEST DETECTABLE LIMIT FOR SERUM ALCOHOL IS 5 mg/dL FOR MEDICAL PURPOSES ONLY    Salicylate level     Status: None   Collection Time: 09/16/14 11:11 AM  Result Value Ref Range   Salicylate Lvl <7.9 2.8 - 30.0 mg/dL  Urine rapid drug screen (hosp performed)not at [facility]  Status: Abnormal   Collection Time: 09/16/14 11:51 AM  Result Value Ref Range   Opiates NONE DETECTED NONE DETECTED   Cocaine NONE DETECTED NONE DETECTED   Benzodiazepines POSITIVE (A) NONE DETECTED   Amphetamines NONE DETECTED NONE DETECTED   Tetrahydrocannabinol NONE DETECTED NONE DETECTED   Barbiturates NONE DETECTED NONE DETECTED    Comment:        DRUG SCREEN FOR MEDICAL PURPOSES ONLY.  IF CONFIRMATION IS NEEDED FOR ANY PURPOSE, NOTIFY LAB WITHIN 5 DAYS.        LOWEST DETECTABLE LIMITS FOR URINE DRUG SCREEN Drug Class       Cutoff (ng/mL) Amphetamine      1000 Barbiturate      200 Benzodiazepine   559 Tricyclics       741 Opiates          300 Cocaine          300 THC              50     Vitals: Blood pressure 112/68, pulse 68, temperature 97.7 F (36.5 C), temperature source Oral, resp. rate 16, SpO2 95 %.  Risk to Self: Suicidal Ideation: Yes-Currently Present Suicidal Intent: Yes-Currently Present Is patient at risk for suicide?: Yes Suicidal Plan?: Yes-Currently Present Specify Current Suicidal Plan: To shoot himself Access to Means: Yes Specify Access to Suicidal Means: Pt reports access to guns What has been your use of drugs/alcohol within the last 12 months?: Occasional use How many times?: 0 Other Self Harm Risks: NA Triggers for Past Attempts: None known Intentional Self Injurious Behavior: None Risk to Others: Homicidal Ideation: No Thoughts of Harm to Others: No Current Homicidal Intent: No Current Homicidal Plan: No Access to Homicidal Means: No Identified Victim: NA History of harm to others?: No Assessment of Violence: None Noted Violent Behavior Description: NA Does patient have access to weapons?: No Criminal Charges Pending?: No Does patient have a  court date: No Prior Inpatient Therapy: Prior Inpatient Therapy: Yes Prior Therapy Dates: 2016 Prior Therapy Facilty/Provider(s): [facility] Reason for Treatment: SI Prior Outpatient Therapy: Prior Outpatient Therapy: Yes Prior Therapy Dates: 2016 Prior Therapy Facilty/Provider(s): [facility] Reason for Treatment: SI Does patient have an ACCT team?: No Does patient have Intensive In-House Services?  : No Does patient have [facility] services? : No Does patient have P4CC services?: No  Current Facility-Administered Medications  Medication Dose Route Frequency Provider Last Rate Last Dose  . acetaminophen (TYLENOL) tablet 650 mg  650 mg Oral Q4H PRN Delos Haring, PA-C      . ARIPiprazole (ABILIFY) tablet 15 mg  15 mg Oral QHS Harriet Butte, NP   15 mg at 09/17/14 2219  . carbamazepine (TEGRETOL) tablet 200 mg  200 mg Oral BID Harriet Butte, NP   200 mg at 09/17/14 2225  . chlorproMAZINE (THORAZINE) tablet 100 mg  100 mg Oral QHS Harriet Butte, NP   100 mg at 09/17/14 2225  . [START ON 09/18/2014] clonazePAM (KLONOPIN) tablet 1 mg  1 mg Oral QID Harriet Butte, NP      . gabapentin (NEURONTIN) capsule 300 mg  300 mg Oral TID Delos Haring, PA-C   300 mg at 09/17/14 2218  . ibuprofen (ADVIL,MOTRIN) tablet 600 mg  600 mg Oral Q8H PRN Delos Haring, PA-C      . LORazepam (ATIVAN) tablet 1 mg  1 mg Oral Q8H PRN Delos Haring, PA-C   1 mg at 09/17/14 0619  . nicotine (NICODERM CQ -  dosed in mg/24 hours) patch 21 mg  21 mg Transdermal Daily Delos Haring, PA-C   21 mg at 09/17/14 0946  . ondansetron (ZOFRAN) tablet 4 mg  4 mg Oral Q8H PRN Delos Haring, PA-C      . prazosin (MINIPRESS) capsule 3 mg  3 mg Oral QHS Delos Haring, PA-C   3 mg at 09/17/14 2218  . zolpidem (AMBIEN) tablet 5 mg  5 mg Oral QHS PRN Delos Haring, PA-C   5 mg at 09/17/14 2225   Current Outpatient Prescriptions  Medication Sig Dispense Refill  . carbamazepine (EQUETRO) 200 MG CP12 12 hr capsule Take 1 capsule (200 mg  total) by mouth 2 (two) times daily. Mood stabilization 60 each 0  . chlorproMAZINE (THORAZINE) 100 MG tablet Take 150 mg by mouth 3 (three) times daily.    . clonazePAM (KLONOPIN) 1 MG tablet Take 1 tablet (1 mg total) by mouth 4 (four) times daily. For severe anxiety 12 tablet 0  . gabapentin (NEURONTIN) 300 MG capsule Take 1 capsule (300 mg total) by mouth 3 (three) times daily. For agitation (Patient taking differently: Take 100 mg by mouth 3 (three) times daily. For agitation) 90 capsule 0  . ibuprofen (ADVIL,MOTRIN) 400 MG tablet Take 1 tablet (400 mg total) by mouth every 6 (six) hours as needed. 30 tablet 0  . naproxen sodium (ANAPROX) 220 MG tablet Take 440 mg by mouth 2 (two) times daily with a meal.    . prazosin (MINIPRESS) 1 MG capsule Take 3 capsules (3 mg total) by mouth at bedtime. For nightmares (Patient taking differently: Take 3 mg by mouth daily as needed (For nightmares). For nightmares) 30 capsule 0  . ARIPiprazole (ABILIFY) 15 MG tablet Take 0.5 tablets (7.5 mg total) by mouth at bedtime. For Depression/mood control (Patient not taking: Reported on 09/16/2014) 30 tablet 0  . cloNIDine (CATAPRES) 0.1 MG tablet Take 1 tablet (0.1 mg total) by mouth 2 (two) times daily. For high blood pressure (Patient not taking: Reported on 08/12/2014) 60 tablet 0  . hydrOXYzine (ATARAX/VISTARIL) 25 MG tablet Take 1 tablet (25 mg total) by mouth every 6 (six) hours as needed for anxiety. (Patient not taking: Reported on 08/12/2014) 45 tablet 0  . nicotine (NICODERM CQ - DOSED IN MG/24 HOURS) 21 mg/24hr patch Place 1 patch (21 mg total) onto the skin daily. For nicotine addiction (Patient not taking: Reported on 08/12/2014) 28 patch 0  . pantoprazole (PROTONIX) 20 MG tablet Take 1 tablet (20 mg total) by mouth daily. (Patient not taking: Reported on 08/12/2014) 14 tablet 0  . potassium chloride (K-DUR,KLOR-CON) 10 MEQ tablet Take 1 tablet (10 mEq total) by mouth daily. For low potassium (Patient not  taking: Reported on 08/12/2014) 30 tablet 0  . testosterone cypionate (DEPOTESTOTERONE CYPIONATE) 200 MG/ML injection Inject 0.5 mLs (100 mg total) into the muscle every 14 (fourteen) days. For low testosterone replacement (Patient not taking: Reported on 07/29/2014) 10 mL 0  . zolpidem (AMBIEN) 10 MG tablet Take 1 tablet (10 mg total) by mouth at bedtime as needed for sleep. 7 tablet 0    Musculoskeletal: Strength & Muscle Tone: within normal limits Gait & Station: normal Patient leans: N/A  Psychiatric Specialty Exam: Physical Exam  ROS  Blood pressure 112/68, pulse 68, temperature 97.7 F (36.5 C), temperature source Oral, resp. rate 16, SpO2 95 %.There is no weight on file to calculate BMI.  General Appearance: Fairly Groomed  Engineer, water::  Good  Speech:  Clear and  Coherent  Volume:  Normal  Mood:  Depressed  Affect:  Restricted and superficial  Thought Process:  Coherent and Goal Directed  Orientation:  Full (Time, Place, and Person)  Thought Content:  no hallucinations, no delusions, not inernally preoccupied  Suicidal Thoughts:  Yes.  with intent/plan shot self with gun, jump over the bridge, not able to contract for safety  Homicidal Thoughts:  No  Memory:  NA  Judgement:  Poor  Insight:  Shallow  Psychomotor Activity:  Decreased  Concentration:  Good  Recall:  Good  Fund of Knowledge:Good  Language: Good  Akathisia:  No  Handed:  Right  AIMS (if indicated):     Assets:  Communication Skills Desire for Improvement Resilience  ADL's:  Intact  Cognition: WNL  Sleep:      Medical Decision Making: Established Problem, Stable/Improving (1), Review of Psycho-Social Stressors (1), Review or order clinical lab tests (1), Established Problem, Worsening (2) and Review of Medication Regimen & Side Effects (2)   Treatment Plan Summary: Daily contact with patient to assess and evaluate symptoms and progress in treatment and Medication management  Plan:  Recommend  psychiatric Inpatient admission when medically cleared.TTS continues to seek placement in other facilities, awaiting response from [facility] for bed availability.   Patient continues to need Psychiatric medications  and the following medications are ordered and Dr. Daleen Bo made aware. Abilify 15mg  QD for mood Carbamazepine 200mg  BID for mood Klonopin 1 mg QID for anxiety Thorazine 100mg  QHS for insomnia/mood Gabapentin 300mg  TID for aggression Prazosin 1mg  3 caps @HS  Ambien 10mg  QHS prn Insomnia   Disposition: Transfer to inpatient facility once a bed is available.  Samantha Crimes 09/17/2014 11:43 PM

## 2014-09-17 NOTE — ED Notes (Signed)
Magistrate's office has not received fax at this time; will call again

## 2014-09-17 NOTE — Progress Notes (Signed)
Per Rose at [facility], referral was not located.  Referral was refaxed but does not go through. Per Waynetta Sandy at [facility], fax it to the ED. Referral went through the fax at [facility] ED.  CSW will continue to follow up.  Melbourne Abts, LCSWA Disposition staff 09/17/2014 5:40 PM

## 2014-09-17 NOTE — ED Notes (Addendum)
Patient requesting daily medications.  MD Effie Shy made aware.

## 2014-09-17 NOTE — ED Notes (Signed)
Patient made aware that our EDP was reordering psych consult and no new medications were to be ordered at this time.  Patient states to this RN "I do not want to take any of my medications and I will not do the psych consult".  This RN explained to the patient that medications are ordered by our physicians.

## 2014-09-17 NOTE — ED Notes (Signed)
Spoke with [facility]; requesting psych consult for medication changes. Informed patient of plan of care for medication. Pt stating "I refuse. I refuse all treatment. I don't want to talk to anyone." Pt also communicating to sitter "If I'm released, I'm going to kill myself. I don't care." Dr.Wentz made aware

## 2014-09-17 NOTE — ED Notes (Signed)
Telepsych set up at bedside for PA

## 2014-09-17 NOTE — ED Notes (Signed)
Pt thankful and cooperative with medications

## 2014-09-17 NOTE — ED Notes (Signed)
Pt. Ambulated to the bathroom with sitter at pt. Side

## 2014-09-18 NOTE — Progress Notes (Signed)
CSW faxed referral to [facility] who has bed availability.   Referral also faxed to [facility] and [facility].  Chad Cordial, Theresia Majors [phone]

## 2014-09-18 NOTE — ED Notes (Signed)
Breakfast meal Tray delivered.  

## 2014-09-18 NOTE — Progress Notes (Signed)
Patient on [facility] waitlist, per Misty Stanley. Patient is under review at [facility], per [facility].  At capacity:  [facility]   CSW will continue to seek placement.  Melbourne Abts, LCSWA Disposition staff 09/18/2014 11:18 PM

## 2014-09-19 DIAGNOSIS — F411 Generalized anxiety disorder: Secondary | ICD-10-CM

## 2014-09-19 MED ORDER — CLONAZEPAM 1 MG PO TABS: 1.0000 mg | ORAL_TABLET | Freq: Four times a day (QID) | ORAL | Status: DC

## 2014-09-19 NOTE — ED Provider Notes (Signed)
Patient was no longer suicidal. He is feeling much better after getting Klonopin. TTS evaluate the patient and is no longer complaining of suicidal ideations. He is amenable to outpatient follow-up. He will be given 12 Klonopin upon discharge to prevent any type of withdrawal seizures. IVC rescinded and patient was discharged home.  Gwyneth Sprout, MD 09/19/14 [phone]

## 2014-09-19 NOTE — Consult Note (Signed)
Telepsych Consultation   Reason for Consult: Medication  Referring Physician:  Dr. Effie Shy Patient Identification: Josealberto Montalto MRN:  [id number] Principal Diagnosis: Generalized anxiety disorder Diagnosis:   Patient Active Problem List   Diagnosis Date Noted  . Generalized anxiety disorder [F41.1] 06/19/2011    Priority: High    Class: Chronic  . Suicidal ideations [R45.851] 06/05/2014    Priority: Medium  . Major depression [F32.2] 06/06/2014  . Panic disorder [F41.0] 05/19/2014  . MDD (major depressive disorder), recurrent, severe, with psychosis [F33.3] 05/18/2014  . PTSD (post-traumatic stress disorder) [F43.10] 01/29/2014    Total Time spent with patient: 25 minutes  Subjective:   Ryatt Corsino is a 44 y.o. male patient presenting to the [facility] numerous times for a history of MDD, PTSD, and GAD. Pt seen and chart reviewed. Pt denies suicidal/homicidal ideation to this NP and nursing report that pt has denied this shortly after receiving benzodiazepines. Pt reports that he has "all of the medications except for the Klonopin," which he receives from his PCP but reports that he has an adequate supply of all other medications. Pt apparently ran out of Klonopin between appointments, but pt does not have a clear explanation as to why this occurred and denies over-use of such. Pt presents as calm, cooperative, alert/oriented x4, optimistic about his treatment plan, and does not appear to be responding to internal stimuli. Affect congruent and objective nursing notes support pt's denied suicidal ideation and reports of improved depression. Pt in agreement to pursue outpatient follow-up and informed that he will only get 3 days of Klonopin as a script, not samples.   Collateral from [place] (pt's gf) was unavailable today due to a disconnected telephone number.   HPI:  Yanis Larin is an 44 y.o. male. Pt arrived voluntarily to [facility]. Pt reports SI with a plan. Pt denies HI. Pt denies AVH. Pt  reports that he has a plan to shoot himself with a gun. Pt states that he does not have a gun but he can find one. Pt states that he has been diagnosed with Depression, PTSD, and Agoraphobia. Pt reports previous inpatient treatment at [facility] and [facility]. Pt states that he is currently receiving outpatient therapy from his counselor Jasmine December. Pt states that his current PCP prescribes his medication. Pt is prescribed Thorazine, Klonopin, Abilify, Carbamazepine, Thorazine, Gabapentin and Prozosine. According to the Pt, he began counseling when he was discharged from [facility] 3 months ago. Pt reports previous marijuana use. Pt denies alcohol use.  Per record reviewed, Patient non compliant with medication since discharge from [facility] and repeatedly communicates to sitter "if I'm released, I'm going to kill my self, I don't care". Patient was IVCed by Dr. Mancel Bale.  Patient was documented having some withdrawal symptoms-anxious, restless,  slight diaphoretic, and threatening to leave the hospital but became calm once a dose of klonopin was administered.    Past Medical History:  Past Medical History  Diagnosis Date  . Anxiety   . Depression   . PTSD (post-traumatic stress disorder)     Past Surgical History  Procedure Laterality Date  . Cholesterol     Family History:  Family History  Problem Relation Age of Onset  . Cancer Mother   . Cancer Father   . Depression Father    Social History:  History  Alcohol Use No     History  Drug Use No    Comment: denies    History   Social History  . Marital  Status: Married    Spouse Name: N/A  . Number of Children: N/A  . Years of Education: N/A   Social History Main Topics  . Smoking status: Current Every Day Smoker -- 1.00 packs/day for 19 years    Types: Cigarettes  . Smokeless tobacco: Not on file  . Alcohol Use: No  . Drug Use: No     Comment: denies  . Sexual Activity: Yes    Birth Control/ Protection: None   Other Topics  Concern  . None   Social History Narrative   Additional Social History:    Pain Medications: Pt denies Prescriptions: Thorazine, Klonopine, Gabapentin Over the Counter: Pt denies History of alcohol / drug use?: Yes Longest period of sobriety (when/how long): Unknown Name of Substance 1: Marijuana 1 - Age of First Use: 21 1 - Amount (size/oz): "a joint" 1 - Frequency: occasional 1 - Duration: ongoing 1 - Last Use / Amount: a week ago                   Allergies:   Allergies  Allergen Reactions  . Mellaril [Thioridazine] Swelling    Bilateral leg swelling    Labs:  No results found for this or any previous visit (from the past 48 hour(s)).  Vitals: Blood pressure 102/56, pulse 95, temperature 97.6 F (36.4 C), temperature source Oral, resp. rate 16, SpO2 95 %.  Risk to Self: Suicidal Ideation: Yes-Currently Present Suicidal Intent: Yes-Currently Present Is patient at risk for suicide?: Yes Suicidal Plan?: Yes-Currently Present Specify Current Suicidal Plan: To shoot himself Access to Means: Yes Specify Access to Suicidal Means: Pt reports access to guns What has been your use of drugs/alcohol within the last 12 months?: Occasional use How many times?: 0 Other Self Harm Risks: NA Triggers for Past Attempts: None known Intentional Self Injurious Behavior: None Risk to Others: Homicidal Ideation: No Thoughts of Harm to Others: No Current Homicidal Intent: No Current Homicidal Plan: No Access to Homicidal Means: No Identified Victim: NA History of harm to others?: No Assessment of Violence: None Noted Violent Behavior Description: NA Does patient have access to weapons?: No Criminal Charges Pending?: No Does patient have a court date: No Prior Inpatient Therapy: Prior Inpatient Therapy: Yes Prior Therapy Dates: 2016 Prior Therapy Facilty/Provider(s): [facility] Reason for Treatment: SI Prior Outpatient Therapy: Prior Outpatient Therapy: Yes Prior Therapy Dates:  2016 Prior Therapy Facilty/Provider(s): [facility] Reason for Treatment: SI Does patient have an ACCT team?: No Does patient have Intensive In-House Services?  : No Does patient have [facility] services? : No Does patient have P4CC services?: No  Current Facility-Administered Medications  Medication Dose Route Frequency Provider Last Rate Last Dose  . acetaminophen (TYLENOL) tablet 650 mg  650 mg Oral Q4H PRN Marlon Pel, PA-C      . ARIPiprazole (ABILIFY) tablet 15 mg  15 mg Oral QHS Worthy Flank, NP   15 mg at 09/18/14 2101  . carbamazepine (TEGRETOL) tablet 200 mg  200 mg Oral BID Worthy Flank, NP   200 mg at 09/19/14 0919  . chlorproMAZINE (THORAZINE) tablet 100 mg  100 mg Oral QHS Worthy Flank, NP   100 mg at 09/18/14 2101  . clonazePAM (KLONOPIN) tablet 1 mg  1 mg Oral QID Worthy Flank, NP   1 mg at 09/19/14 0919  . gabapentin (NEURONTIN) capsule 300 mg  300 mg Oral TID Marlon Pel, PA-C   300 mg at 09/19/14 0919  . ibuprofen (ADVIL,MOTRIN) tablet  600 mg  600 mg Oral Q8H PRN Marlon Pel, PA-C      . LORazepam (ATIVAN) tablet 1 mg  1 mg Oral Q8H PRN Marlon Pel, PA-C   1 mg at 09/17/14 6378  . nicotine (NICODERM CQ - dosed in mg/24 hours) patch 21 mg  21 mg Transdermal Daily Marlon Pel, PA-C   21 mg at 09/19/14 0919  . ondansetron (ZOFRAN) tablet 4 mg  4 mg Oral Q8H PRN Marlon Pel, PA-C      . prazosin (MINIPRESS) capsule 3 mg  3 mg Oral QHS Marlon Pel, PA-C   3 mg at 09/18/14 2138  . zolpidem (AMBIEN) tablet 5 mg  5 mg Oral QHS PRN Marlon Pel, PA-C   5 mg at 09/18/14 2101   Current Outpatient Prescriptions  Medication Sig Dispense Refill  . carbamazepine (EQUETRO) 200 MG CP12 12 hr capsule Take 1 capsule (200 mg total) by mouth 2 (two) times daily. Mood stabilization 60 each 0  . chlorproMAZINE (THORAZINE) 100 MG tablet Take 150 mg by mouth 3 (three) times daily.    . clonazePAM (KLONOPIN) 1 MG tablet Take 1 tablet (1 mg total) by mouth 4 (four) times  daily. For severe anxiety 12 tablet 0  . gabapentin (NEURONTIN) 300 MG capsule Take 1 capsule (300 mg total) by mouth 3 (three) times daily. For agitation (Patient taking differently: Take 100 mg by mouth 3 (three) times daily. For agitation) 90 capsule 0  . ibuprofen (ADVIL,MOTRIN) 400 MG tablet Take 1 tablet (400 mg total) by mouth every 6 (six) hours as needed. 30 tablet 0  . naproxen sodium (ANAPROX) 220 MG tablet Take 440 mg by mouth 2 (two) times daily with a meal.    . prazosin (MINIPRESS) 1 MG capsule Take 3 capsules (3 mg total) by mouth at bedtime. For nightmares (Patient taking differently: Take 3 mg by mouth daily as needed (For nightmares). For nightmares) 30 capsule 0  . ARIPiprazole (ABILIFY) 15 MG tablet Take 0.5 tablets (7.5 mg total) by mouth at bedtime. For Depression/mood control (Patient not taking: Reported on 09/16/2014) 30 tablet 0  . cloNIDine (CATAPRES) 0.1 MG tablet Take 1 tablet (0.1 mg total) by mouth 2 (two) times daily. For high blood pressure (Patient not taking: Reported on 08/12/2014) 60 tablet 0  . hydrOXYzine (ATARAX/VISTARIL) 25 MG tablet Take 1 tablet (25 mg total) by mouth every 6 (six) hours as needed for anxiety. (Patient not taking: Reported on 08/12/2014) 45 tablet 0  . nicotine (NICODERM CQ - DOSED IN MG/24 HOURS) 21 mg/24hr patch Place 1 patch (21 mg total) onto the skin daily. For nicotine addiction (Patient not taking: Reported on 08/12/2014) 28 patch 0  . pantoprazole (PROTONIX) 20 MG tablet Take 1 tablet (20 mg total) by mouth daily. (Patient not taking: Reported on 08/12/2014) 14 tablet 0  . potassium chloride (K-DUR,KLOR-CON) 10 MEQ tablet Take 1 tablet (10 mEq total) by mouth daily. For low potassium (Patient not taking: Reported on 08/12/2014) 30 tablet 0  . testosterone cypionate (DEPOTESTOTERONE CYPIONATE) 200 MG/ML injection Inject 0.5 mLs (100 mg total) into the muscle every 14 (fourteen) days. For low testosterone replacement (Patient not taking:  Reported on 07/29/2014) 10 mL 0  . zolpidem (AMBIEN) 10 MG tablet Take 1 tablet (10 mg total) by mouth at bedtime as needed for sleep. 7 tablet 0    Musculoskeletal: UTO, camera  Psychiatric Specialty Exam: Physical Exam  Review of Systems  Psychiatric/Behavioral: Positive for depression. Negative for suicidal ideas  and hallucinations. The patient is nervous/anxious.     Blood pressure 102/56, pulse 95, temperature 97.6 F (36.4 C), temperature source Oral, resp. rate 16, SpO2 95 %.There is no weight on file to calculate BMI.  General Appearance: Fairly Groomed  Patent attorney::  Good  Speech:  Clear and Coherent and Normal Rate  Volume:  Normal  Mood:  Euthymic  Affect:  Appropriate and Congruent  Thought Process:  Coherent and Goal Directed  Orientation:  Full (Time, Place, and Person)  Thought Content:  no hallucinations, no delusions, not inernally preoccupied pt continues to report that he has all his meds "except Klonopin"  Suicidal Thoughts:  No pt denied when given the benzodiazepine medications he requested, continues to deny and is able to contract for safety  Homicidal Thoughts:  No  Memory:  Immediate;   Fair Recent;   Fair Remote;   Fair  Judgement:  Poor  Insight:  Shallow  Psychomotor Activity:  Normal  Concentration:  Good  Recall:  Good  Fund of Knowledge:Good  Language: Good  Akathisia:  No  Handed:  Right  AIMS (if indicated):     Assets:  Communication Skills Desire for Improvement Resilience  ADL's:  Intact  Cognition: WNL  Sleep:      Medical Decision Making: Self-Limited or Minor (1), Review of Psycho-Social Stressors (1), Review or order clinical lab tests (1), Review of Medication Regimen & Side Effects (2) and Review of New Medication or Change in Dosage (2)   Treatment Plan Summary: Generalized anxiety disorder, improving, baseline, stable for discharge and outpatient management  Disposition:   -Rescind IVC -Discharge home -[facility] TTS or social  work to give resources to pt regarding outpatient followup for psychiatry/counseling services -Pt to see PCP who reportedly prescribes his meds, on July 10th appointment or sooner if possible   -EDP, please prescribe (for safety reasons only), Klonopin 1mg  qid prn anxiety #12  *Case reviewed with Dr. Cleda Daub, Everardo All, FNP-BC 09/19/2014 10:59 AM   Case reviewed and agree with plan.

## 2014-09-19 NOTE — Discharge Instructions (Signed)

## 2014-09-19 NOTE — ED Notes (Signed)
Vital signs stable. 

## 2014-09-19 NOTE — ED Notes (Signed)
Pt ambulated to nursing desk to use the phone.

## 2014-09-19 NOTE — ED Notes (Addendum)
Relieved sitter for lunch.

## 2014-09-19 NOTE — ED Notes (Signed)
Food that had been faxed @ 6:30 finally arrived @ 8:37. Called @8:20 for the second time to ask where the food was at and the lady on the line said the food was on the elevator up to our location @ 8:17.   

## 2014-10-21 ENCOUNTER — Encounter (HOSPITAL_COMMUNITY): Payer: Self-pay

## 2014-10-21 ENCOUNTER — Emergency Department (HOSPITAL_COMMUNITY)
Admission: EM | Admit: 2014-10-21 | Discharge: 2014-10-23 | Payer: Medicaid Other | Attending: Emergency Medicine | Admitting: Emergency Medicine

## 2014-10-21 DIAGNOSIS — F419 Anxiety disorder, unspecified: Secondary | ICD-10-CM | POA: Diagnosis not present

## 2014-10-21 DIAGNOSIS — Z720 Tobacco use: Secondary | ICD-10-CM | POA: Diagnosis not present

## 2014-10-21 DIAGNOSIS — Z79899 Other long term (current) drug therapy: Secondary | ICD-10-CM | POA: Diagnosis not present

## 2014-10-21 DIAGNOSIS — F1510 Other stimulant abuse, uncomplicated: Secondary | ICD-10-CM | POA: Insufficient documentation

## 2014-10-21 DIAGNOSIS — F332 Major depressive disorder, recurrent severe without psychotic features: Secondary | ICD-10-CM | POA: Diagnosis not present

## 2014-10-21 DIAGNOSIS — Z791 Long term (current) use of non-steroidal anti-inflammatories (NSAID): Secondary | ICD-10-CM | POA: Insufficient documentation

## 2014-10-21 DIAGNOSIS — F333 Major depressive disorder, recurrent, severe with psychotic symptoms: Secondary | ICD-10-CM | POA: Diagnosis present

## 2014-10-21 DIAGNOSIS — F1310 Sedative, hypnotic or anxiolytic abuse, uncomplicated: Secondary | ICD-10-CM | POA: Diagnosis not present

## 2014-10-21 DIAGNOSIS — F4310 Post-traumatic stress disorder, unspecified: Secondary | ICD-10-CM | POA: Insufficient documentation

## 2014-10-21 DIAGNOSIS — R45851 Suicidal ideations: Secondary | ICD-10-CM

## 2014-10-21 DIAGNOSIS — F329 Major depressive disorder, single episode, unspecified: Secondary | ICD-10-CM | POA: Diagnosis present

## 2014-10-21 LAB — COMPREHENSIVE METABOLIC PANEL
ALK PHOS: 104 U/L (ref 38–126)
ALT: 37 U/L (ref 17–63)
ANION GAP: 9 (ref 5–15)
AST: 33 U/L (ref 15–41)
Albumin: 4.5 g/dL (ref 3.5–5.0)
BUN: 19 mg/dL (ref 6–20)
CO2: 26 mmol/L (ref 22–32)
CREATININE: 1.06 mg/dL (ref 0.61–1.24)
Calcium: 9.5 mg/dL (ref 8.9–10.3)
Chloride: 106 mmol/L (ref 101–111)
GFR calc Af Amer: >60 mL/min (ref >60)
GFR calc non Af Amer: >60 mL/min (ref >60)
Glucose, Bld: 109 mg/dL — ABNORMAL HIGH (ref 65–99)
POTASSIUM: 3.9 mmol/L (ref 3.5–5.1)
SODIUM: 141 mmol/L (ref 135–145)
TOTAL PROTEIN: 8.2 g/dL — AB (ref 6.5–8.1)
Total Bilirubin: 0.7 mg/dL (ref 0.3–1.2)

## 2014-10-21 LAB — ETHANOL: Alcohol, Ethyl (B): <5 mg/dL (ref <5)

## 2014-10-21 LAB — RAPID URINE DRUG SCREEN, HOSP PERFORMED
Amphetamines: POSITIVE — AB
Barbiturates: NOT DETECTED
Benzodiazepines: POSITIVE — AB
Cocaine: NOT DETECTED
Opiates: NOT DETECTED
Tetrahydrocannabinol: POSITIVE — AB

## 2014-10-21 LAB — CBC
HEMATOCRIT: 47.2 % (ref 39.0–52.0)
HEMOGLOBIN: 16.2 g/dL (ref 13.0–17.0)
MCH: 32.6 pg (ref 26.0–34.0)
MCHC: 34.3 g/dL (ref 30.0–36.0)
MCV: 95 fL (ref 78.0–100.0)
PLATELETS: 270 10*3/uL (ref 150–400)
RBC: 4.97 MIL/uL (ref 4.22–5.81)
RDW: 13 % (ref 11.5–15.5)
WBC: 9.5 10*3/uL (ref 4.0–10.5)

## 2014-10-21 LAB — ACETAMINOPHEN LEVEL: Acetaminophen (Tylenol), Serum: <10 ug/mL — ABNORMAL LOW (ref 10–30)

## 2014-10-21 LAB — SALICYLATE LEVEL: Salicylate Lvl: <4 mg/dL (ref 2.8–30.0)

## 2014-10-21 MED ORDER — ACETAMINOPHEN 325 MG PO TABS: 650.0000 mg | ORAL_TABLET | ORAL | Status: DC | PRN

## 2014-10-21 MED ORDER — ZIPRASIDONE MESYLATE 20 MG IM SOLR
10.0000 mg | Freq: Once | INTRAMUSCULAR | Status: AC
  Administered 2014-10-21: 10 mg via INTRAMUSCULAR
  Filled 2014-10-21: qty 20

## 2014-10-21 MED ORDER — LORAZEPAM 0.5 MG PO TABS
1.0000 mg | ORAL_TABLET | Freq: Three times a day (TID) | ORAL | Status: DC | PRN
  Administered 2014-10-22 – 2014-10-23 (×2): 1 mg via ORAL
  Filled 2014-10-21 (×2): qty 2

## 2014-10-21 MED ORDER — LORAZEPAM 2 MG/ML IJ SOLN
2.0000 mg | Freq: Once | INTRAMUSCULAR | Status: AC
  Administered 2014-10-21: 2 mg via INTRAMUSCULAR
  Filled 2014-10-21: qty 1

## 2014-10-21 MED ORDER — IBUPROFEN 200 MG PO TABS: 600.0000 mg | ORAL_TABLET | Freq: Three times a day (TID) | ORAL | Status: DC | PRN

## 2014-10-21 NOTE — ED Notes (Signed)
PT OUT OF METAL CUFFS

## 2014-10-21 NOTE — ED Notes (Signed)
Patient calm at present. Tearful when speaking with him at this time. Appears depressed. Given sandwich, coke and wanted to use the phone. Passive SI in the ED. Will monitor frequently.

## 2014-10-21 NOTE — BH Assessment (Signed)
Spoke with EDP after the assessment regarding patient. Informed EDP that patient denies SI/HI and psychosis at this time but is requesting to leave. Patient also states "I know what to say to leave." Counselor asked about IVC and EDP indicates that due to patient denying SI/HI and psychosis she states there is no reason at this time to IVC the patient.     Davina Poke, LCSW Therapeutic Triage Specialist [facility] 10/21/2014 1:24 PM
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
## 2014-10-21 NOTE — BH Assessment (Addendum)
Spoke with patient about collateral information to discharge. Patient states that he is "safe" and "the last thing I want to do is kill myself." Patient affect remains flat. Patient declined to provide information of the person who brought him in to ED today but provided his girlfriend "Marchelle Folks" telephone number as [phone]. Counselor also indicated that the PNP may be able to see him later today and he declined and said that he does not feel that it would be helpful because he does not want to harm himself.     Counselor called patients ex-girlfriend Marchelle Folks at [phone] for collateral information, there was no answer. Counselor left a message requesting the patient call [phone] as soon as possible.    Marchelle Folks called back and states that patient has been to ED for depression before and has been hospitalized for a minimum of 4 times. Marchelle Folks states that she last saw the patient tis morning. Marchelle Folks provided the information to contact Marvia Pickles who the patient is currently living with and who brought the patient into the ED this morning.    Davina Poke, LCSW Therapeutic Triage Specialist [facility] 10/21/2014 2:12 PM

## 2014-10-21 NOTE — ED Notes (Signed)
Pt agrees to cooperate and change into scrubs. Clydie Braun RN Leadership aware of pt current status. TTS aware IVC [employer] present assisting.

## 2014-10-21 NOTE — ED Notes (Signed)
Pt presents with [employer] and house security sitting in triage stretcher. Metal hand cuffs- bil wrist present. Good CMS present. Pt in NAD.

## 2014-10-21 NOTE — ED Notes (Signed)
Patient has been polite and appropriate since brought back to [facility]. He has been tearful but no self harm or harm toward others. Requesting to go to [facility]. Social work notified

## 2014-10-21 NOTE — ED Notes (Signed)
Patient states he is feeling very depressed and is not sure if he is suicidal or not. Patient stares into space and has delayed response to questions asked. Patienet denies any auditory or visual hallucinations.Patient states he had marijuana 4 days ago, aderall yesterday as well. Patient frequently states, "I don't know" even when not spoken to.

## 2014-10-21 NOTE — BH Assessment (Signed)
Consulted with Dahlia Byes, NP who recommends patient be evaluated by psychiatry and evaluated face-to-face by psychiatry tomorrow.    Informed EDP of recommendation and suggested patient be IVC'd if not willing to stay overnight for evaluation due to history of SI, collateral information, and patient presentation.

## 2014-10-21 NOTE — BH Assessment (Signed)
Counselor spoke with Phillip [name] at [phone] who states that the patient called him this morning and states :I was going to kill myself but I called you." Sherrine Maples states that the patient requested that he provide transportation to the ED and states that he wanted "long term treatment" for SI. Sherrine Maples states that he "rushed right over" to pick him up and patient has stated that "I don't have a reason to live" "several times"  And this is the first time that he has requested help.  Sherrine Maples states "I just want to leave the state and get away and that way I won't hurt you and Alice because I don't want to hurt you two."  Sherrine Maples states that on the ride to the hospital patient states "I almost just killed myself but I decided to call you."  Sherrine Maples states that he feels that the patient needs VA benefits to get help and have some type of long term treatment.  Sherrine Maples states "he needs psychiatric help." Sherrine Maples states that he was willing to pay for a psychiatrist for the patient due to his need for professional help. Sherrine Maples states that the patient does not have access to any guns due to a previous suicide attempt but "is very capable of hurting himself with a knife or anything else, he really needs help. "     Davina Poke, LCSW Therapeutic Triage Specialist [facility] 10/21/2014 2:03 PM
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
## 2014-10-22 DIAGNOSIS — F332 Major depressive disorder, recurrent severe without psychotic features: Secondary | ICD-10-CM | POA: Insufficient documentation

## 2014-10-22 DIAGNOSIS — R45851 Suicidal ideations: Secondary | ICD-10-CM | POA: Insufficient documentation

## 2014-10-22 MED ORDER — PRAZOSIN HCL 1 MG PO CAPS
1.0000 mg | ORAL_CAPSULE | Freq: Every day | ORAL | Status: DC
  Administered 2014-10-22: 1 mg via ORAL
  Filled 2014-10-22 (×2): qty 1

## 2014-10-22 MED ORDER — CARBAMAZEPINE ER 200 MG PO TB12
200.0000 mg | ORAL_TABLET | Freq: Two times a day (BID) | ORAL | Status: DC
  Administered 2014-10-22 – 2014-10-23 (×2): 200 mg via ORAL
  Filled 2014-10-22 (×3): qty 1

## 2014-10-22 MED ORDER — GABAPENTIN 300 MG PO CAPS
300.0000 mg | ORAL_CAPSULE | Freq: Two times a day (BID) | ORAL | Status: DC
  Administered 2014-10-22 – 2014-10-23 (×2): 300 mg via ORAL
  Filled 2014-10-22 (×2): qty 1

## 2014-10-22 MED ORDER — ARIPIPRAZOLE 10 MG PO TABS
10.0000 mg | ORAL_TABLET | Freq: Every day | ORAL | Status: DC
  Administered 2014-10-23: 10 mg via ORAL
  Filled 2014-10-22 (×2): qty 1

## 2014-10-22 MED ORDER — NICOTINE 21 MG/24HR TD PT24
21.0000 mg | MEDICATED_PATCH | Freq: Every day | TRANSDERMAL | Status: DC
  Administered 2014-10-22 – 2014-10-23 (×2): 21 mg via TRANSDERMAL
  Filled 2014-10-22 (×2): qty 1

## 2014-10-22 MED ORDER — CLONAZEPAM 0.5 MG PO TABS
0.5000 mg | ORAL_TABLET | Freq: Two times a day (BID) | ORAL | Status: DC
  Administered 2014-10-22 – 2014-10-23 (×2): 0.5 mg via ORAL
  Filled 2014-10-22 (×2): qty 1

## 2014-10-22 NOTE — BH Assessment (Signed)
[facility] Assessment Progress Note  At 17:00 [place] calls from [facility].  Pt has been accepted to their facility by Rex Kras, MD, but cannot be transported until after 07:00 on 10/23/2014.  Please call report to [phone].  Dahlia Byes, NP concurs with this plan.  Pt's nurse has been notified.  Doylene Canning, MA Triage Specialist [phone]

## 2014-10-22 NOTE — BHH Counselor (Signed)
Patients ex-girlfriend Myer Haff states that patient contacted her at 5:39PM yesterday and states that he "is going to convince y'all to let him go today and he is going to kill himself." Marchelle Folks states that patient wanted to let her know because he wants her to know that its her fault. [phone] is Marchelle Folks Reece's cell number, and her work number is [phone] and she states that she is available for additional questions if needed.    Marchelle Folks states that he let her know "so I can wake up everyday knowing that I caused it once i found out."     Davina Poke, LCSW Therapeutic Triage Specialist [facility] 10/22/2014 8:18 AM

## 2014-10-22 NOTE — ED Notes (Signed)
[employer] [employer] requested for am.

## 2014-10-22 NOTE — Consult Note (Signed)
[facility] Consult   Reason for Consult:  Major depressive disorder, recurrent, severe without Psychosis Referring Physician:  EDP Patient Identification: Phillip [name] MRN:  [id number] Principal Diagnosis: MDD (major depressive disorder), recurrent, severe, with psychosis Diagnosis:   Patient Active Problem List   Diagnosis Date Noted  . Major depression [F32.2] 06/06/2014  . Suicidal ideations [R45.851] 06/05/2014  . Panic disorder [F41.0] 05/19/2014  . MDD (major depressive disorder), recurrent, severe, without Psychosis [F33.3] 05/18/2014  . PTSD (post-traumatic stress disorder) [F43.10] 01/29/2014  . Generalized anxiety disorder [F41.1] 06/19/2011    Class: Chronic    Total Time spent with patient: 1 hour  Subjective:   Phillip [name] is a 44 y.o. male patient admitted with Major depressive disorder, recurrent, severe without Psychosis  HPI:  Caucasian male, 44 years old was evaluated to day for increased depressive feelings and suicidal ideation.  Patient was brought in by his friend after he informed him that he wanted to kill himself.  Patient on getting to the ER denied wanting to kill himself and wanted to be discharged home.  Patient told his friend that he knows what to say to get out of the hospital.  He  was IVC by the EDP.  Patient was hospitalized at our [facility] last month and and had to leave without completing treatment when he threatened the provider.   His UDS is positive for Marijuana, Amphetamine and Benzodiazepine. Patient stated that he has been feeling suicidal for a while.  Patient is not compliant with some of his medications.  He has not gone to see his outpatient provider for his follow up.  He has remained agitated here still wanting to leave and asking for his Klonopin.  Patient has been accepted for admission and we  will be seeking placement.  HPI Elements:   Location:  Major depressive disorder, recurrent, severe without Psychosis, Suicidal  ideation. Quality:  severe. Severity:  severe. Timing:  acute. Duration:  Chronic mental illness. Context:  Brought in and IVC by EDP for suicidal ideation.  Past Medical History:  Past Medical History  Diagnosis Date  . Anxiety   . Depression   . PTSD (post-traumatic stress disorder)     Past Surgical History  Procedure Laterality Date  . Cholesterol    . Wisdom tooth extraction     Family History:  Family History  Problem Relation Age of Onset  . Cancer Mother   . Cancer Father   . Depression Father    Social History:  History  Alcohol Use No     History  Drug Use Not on file    Comment: seldom    History   Social History  . Marital Status: Married    Spouse Name: N/A  . Number of Children: N/A  . Years of Education: N/A   Social History Main Topics  . Smoking status: Current Every Day Smoker -- 1.00 packs/day for 19 years    Types: Cigarettes  . Smokeless tobacco: Never Used  . Alcohol Use: No  . Drug Use: Not on file     Comment: seldom  . Sexual Activity: Yes    Birth Control/ Protection: None   Other Topics Concern  . None   Social History Narrative   Additional Social History:    Pain Medications: See PTA Prescriptions: See PTA Over the Counter: See PTA History of alcohol / drug use?: No history of alcohol / drug abuse  Allergies:   Allergies  Allergen Reactions  . Mellaril [Thioridazine] Swelling    Bilateral leg swelling    Labs:  Results for orders placed or performed during the hospital encounter of 10/21/14 (from the past 48 hour(s))  Comprehensive metabolic panel     Status: Abnormal   Collection Time: 10/21/14 10:48 AM  Result Value Ref Range   Sodium 141 135 - 145 mmol/L   Potassium 3.9 3.5 - 5.1 mmol/L   Chloride 106 101 - 111 mmol/L   CO2 26 22 - 32 mmol/L   Glucose, Bld 109 (H) 65 - 99 mg/dL   BUN 19 6 - 20 mg/dL   Creatinine, Ser 1.06 0.61 - 1.24 mg/dL   Calcium 9.5 8.9 - 10.3 mg/dL    Total Protein 8.2 (H) 6.5 - 8.1 g/dL   Albumin 4.5 3.5 - 5.0 g/dL   AST 33 15 - 41 U/L   ALT 37 17 - 63 U/L   Alkaline Phosphatase 104 38 - 126 U/L   Total Bilirubin 0.7 0.3 - 1.2 mg/dL   GFR calc non Af Amer >60 >60 mL/min   GFR calc Af Amer >60 >60 mL/min    Comment: (NOTE) The eGFR has been calculated using the CKD EPI equation. This calculation has not been validated in all clinical situations. eGFR's persistently <60 mL/min signify possible Chronic Kidney Disease.    Anion gap 9 5 - 15  CBC     Status: None   Collection Time: 10/21/14 10:48 AM  Result Value Ref Range   WBC 9.5 4.0 - 10.5 K/uL   RBC 4.97 4.22 - 5.81 MIL/uL   Hemoglobin 16.2 13.0 - 17.0 g/dL   HCT 47.2 39.0 - 52.0 %   MCV 95.0 78.0 - 100.0 fL   MCH 32.6 26.0 - 34.0 pg   MCHC 34.3 30.0 - 36.0 g/dL   RDW 13.0 11.5 - 15.5 %   Platelets 270 150 - 400 K/uL  Ethanol (ETOH)     Status: None   Collection Time: 10/21/14 10:49 AM  Result Value Ref Range   Alcohol, Ethyl (B) <5 <5 mg/dL    Comment:        LOWEST DETECTABLE LIMIT FOR SERUM ALCOHOL IS 5 mg/dL FOR MEDICAL PURPOSES ONLY   Salicylate level     Status: None   Collection Time: 10/21/14 10:49 AM  Result Value Ref Range   Salicylate Lvl <1.9 2.8 - 30.0 mg/dL  Acetaminophen level     Status: Abnormal   Collection Time: 10/21/14 10:49 AM  Result Value Ref Range   Acetaminophen (Tylenol), Serum <10 (L) 10 - 30 ug/mL    Comment:        THERAPEUTIC CONCENTRATIONS VARY SIGNIFICANTLY. A RANGE OF 10-30 ug/mL MAY BE AN EFFECTIVE CONCENTRATION FOR MANY PATIENTS. HOWEVER, SOME ARE BEST TREATED AT CONCENTRATIONS OUTSIDE THIS RANGE. ACETAMINOPHEN CONCENTRATIONS >150 ug/mL AT 4 HOURS AFTER INGESTION AND >50 ug/mL AT 12 HOURS AFTER INGESTION ARE OFTEN ASSOCIATED WITH TOXIC REACTIONS.   Urine rapid drug screen (hosp performed) (Not at [facility])     Status: Abnormal   Collection Time: 10/21/14 12:24 PM  Result Value Ref Range   Opiates NONE DETECTED NONE  DETECTED   Cocaine NONE DETECTED NONE DETECTED   Benzodiazepines POSITIVE (A) NONE DETECTED   Amphetamines POSITIVE (A) NONE DETECTED   Tetrahydrocannabinol POSITIVE (A) NONE DETECTED   Barbiturates NONE DETECTED NONE DETECTED    Comment:        DRUG SCREEN FOR MEDICAL  PURPOSES ONLY.  IF CONFIRMATION IS NEEDED FOR ANY PURPOSE, NOTIFY LAB WITHIN 5 DAYS.        LOWEST DETECTABLE LIMITS FOR URINE DRUG SCREEN Drug Class       Cutoff (ng/mL) Amphetamine      1000 Barbiturate      200 Benzodiazepine   161 Tricyclics       096 Opiates          300 Cocaine          300 THC              50     Vitals: Blood pressure 123/71, pulse 57, temperature 98 F (36.7 C), temperature source Oral, resp. rate 18, height 5' 10.5" (1.791 m), weight 85.73 kg (189 lb), SpO2 99 %.  Risk to Self: Suicidal Ideation: No Suicidal Intent: No Is patient at risk for suicide?: No Suicidal Plan?: No Specify Current Suicidal Plan: "shoot self wihtin past six months, none now" Access to Means: No (pt denies access to guns/weapons) Specify Access to Suicidal Means: pt denies at this time What has been your use of drugs/alcohol within the last 12 months?: Adderall yesterday How many times?:  ("a few") Other Self Harm Risks: N/A Triggers for Past Attempts: Other (Comment) (Military trauma) Intentional Self Injurious Behavior: None Risk to Others: Homicidal Ideation: No Thoughts of Harm to Others: No Current Homicidal Intent: No Current Homicidal Plan: No Access to Homicidal Means: No Identified Victim: N/A History of harm to others?: No Assessment of Violence: None Noted Violent Behavior Description: N/A Does patient have access to weapons?: No Criminal Charges Pending?: No Does patient have a court date: No Prior Inpatient Therapy: Prior Inpatient Therapy: Yes Prior Therapy Dates: 2016 Prior Therapy Facilty/Provider(s): [facility] Reason for Treatment: SI Prior Outpatient Therapy: Prior Outpatient Therapy:  Yes Prior Therapy Dates: 2016 Prior Therapy Facilty/Provider(s): [facility] Reason for Treatment: Depression Does patient have an ACCT team?: No Does patient have Intensive In-House Services?  : No Does patient have [facility] services? : No Does patient have P4CC services?: No  Current Facility-Administered Medications  Medication Dose Route Frequency Provider Last Rate Last Dose  . acetaminophen (TYLENOL) tablet 650 mg  650 mg Oral Q4H PRN Charlesetta Shanks, MD      . ARIPiprazole (ABILIFY) tablet 10 mg  10 mg Oral Daily Delfin Gant, NP      . carbamazepine (TEGRETOL XR) 12 hr tablet 200 mg  200 mg Oral BID Delfin Gant, NP      . clonazePAM (KLONOPIN) tablet 0.5 mg  0.5 mg Oral BID Delfin Gant, NP      . gabapentin (NEURONTIN) capsule 300 mg  300 mg Oral BID Delfin Gant, NP      . ibuprofen (ADVIL,MOTRIN) tablet 600 mg  600 mg Oral Q8H PRN Charlesetta Shanks, MD      . LORazepam (ATIVAN) tablet 1 mg  1 mg Oral Q8H PRN Charlesetta Shanks, MD      . prazosin (MINIPRESS) capsule 1 mg  1 mg Oral QHS Delfin Gant, NP       Current Outpatient Prescriptions  Medication Sig Dispense Refill  . carbamazepine (EQUETRO) 200 MG CP12 12 hr capsule Take 1 capsule (200 mg total) by mouth 2 (two) times daily. Mood stabilization 60 each 0  . chlorproMAZINE (THORAZINE) 100 MG tablet Take 150 mg by mouth 3 (three) times daily.    . clonazePAM (KLONOPIN) 1 MG tablet Take 1 tablet (1 mg total) by  mouth 4 (four) times daily. 12 tablet 0  . cloNIDine (CATAPRES) 0.1 MG tablet Take 1 tablet (0.1 mg total) by mouth 2 (two) times daily. For high blood pressure 60 tablet 0  . gabapentin (NEURONTIN) 300 MG capsule Take 1 capsule (300 mg total) by mouth 3 (three) times daily. For agitation (Patient taking differently: Take 100 mg by mouth 3 (three) times daily. For agitation) 90 capsule 0  . naproxen sodium (ANAPROX) 220 MG tablet Take 440 mg by mouth 2 (two) times daily with a meal.    . prazosin  (MINIPRESS) 1 MG capsule Take 3 capsules (3 mg total) by mouth at bedtime. For nightmares (Patient taking differently: Take 3 mg by mouth daily as needed (For nightmares). For nightmares) 30 capsule 0  . zolpidem (AMBIEN) 10 MG tablet Take 1 tablet (10 mg total) by mouth at bedtime as needed for sleep. 7 tablet 0  . ARIPiprazole (ABILIFY) 15 MG tablet Take 0.5 tablets (7.5 mg total) by mouth at bedtime. For Depression/mood control (Patient not taking: Reported on 09/16/2014) 30 tablet 0  . hydrOXYzine (ATARAX/VISTARIL) 25 MG tablet Take 1 tablet (25 mg total) by mouth every 6 (six) hours as needed for anxiety. (Patient not taking: Reported on 08/12/2014) 45 tablet 0  . ibuprofen (ADVIL,MOTRIN) 400 MG tablet Take 1 tablet (400 mg total) by mouth every 6 (six) hours as needed. (Patient not taking: Reported on 10/21/2014) 30 tablet 0  . nicotine (NICODERM CQ - DOSED IN MG/24 HOURS) 21 mg/24hr patch Place 1 patch (21 mg total) onto the skin daily. For nicotine addiction (Patient not taking: Reported on 08/12/2014) 28 patch 0  . pantoprazole (PROTONIX) 20 MG tablet Take 1 tablet (20 mg total) by mouth daily. (Patient not taking: Reported on 08/12/2014) 14 tablet 0  . potassium chloride (K-DUR,KLOR-CON) 10 MEQ tablet Take 1 tablet (10 mEq total) by mouth daily. For low potassium (Patient not taking: Reported on 08/12/2014) 30 tablet 0  . testosterone cypionate (DEPOTESTOTERONE CYPIONATE) 200 MG/ML injection Inject 0.5 mLs (100 mg total) into the muscle every 14 (fourteen) days. For low testosterone replacement (Patient not taking: Reported on 07/29/2014) 10 mL 0    Musculoskeletal: Strength & Muscle Tone: within normal limits Gait & Station: normal Patient leans: N/A  Psychiatric Specialty Exam: Physical Exam  Review of Systems  Constitutional: Negative.   HENT: Negative.   Eyes: Negative.   Respiratory: Negative.   Cardiovascular: Negative.   Gastrointestinal: Negative.   Genitourinary: Negative.    Musculoskeletal: Negative.   Skin: Negative.   Neurological: Negative.   Endo/Heme/Allergies: Negative.     Blood pressure 123/71, pulse 57, temperature 98 F (36.7 C), temperature source Oral, resp. rate 18, height 5' 10.5" (1.791 m), weight 85.73 kg (189 lb), SpO2 99 %.Body mass index is 26.73 kg/(m^2).  General Appearance: Casual  Eye Contact::  Minimal  Speech:  Clear and Coherent and Normal Rate  Volume:  Normal  Mood:  Angry, Anxious and Irritable  Affect:  Congruent, Depressed and Labile  Thought Process:  Coherent, Goal Directed and Intact  Orientation:  Full (Time, Place, and Person)  Thought Content:  WDL  Suicidal Thoughts:  No  Homicidal Thoughts:  No  Memory:  Immediate;   Good Recent;   Good Remote;   Good  Judgement:  Poor  Insight:  Shallow  Psychomotor Activity:  Normal  Concentration:  Fair  Recall:  NA  Fund of Knowledge:Poor  Language: Fair  Akathisia:  NA  Handed:  Right  AIMS (if indicated):     Assets:  Desire for Improvement  ADL's:  Intact  Cognition: WNL  Sleep:      Medical Decision Making: Review of Psycho-Social Stressors (1)  Treatment Plan Summary: Daily contact with patient to assess and evaluate symptoms and progress in treatment and Medication management  Plan: Resume all home medications  Disposition:  Admit and seek placement.  Delfin Gant   PMHNP-BC 10/22/2014 4:14 PM Patient seen face-to-face for psychiatric evaluation, chart reviewed and case discussed with the physician extender and developed treatment plan. Reviewed the information documented and agree with the treatment plan. Corena Pilgrim, MD

## 2014-10-22 NOTE — ED Provider Notes (Signed)
IVC refilled by myself as was only hold for 1 day and TTS recommending inpt treatment. Pt denies ever reporting SI, though is clearly stated in multiple notes.   1. Major depressive disorder, recurrent, severe without psychotic features   2. Suicidal ideation   3. PTSD (post-traumatic stress disorder)      Toy Cookey, MD 10/22/14 [id number]

## 2014-10-22 NOTE — BHH Counselor (Signed)
Per Donell Sievert, PA pt meets inpt criteria and can be accepted to [facility] pending bed availability.  Per Clint Bolder [facility], there are no appropriate beds at [facility] tonight, but could possibly have some discharges in the AM. TTS to seeking placement.  Sent referral to:  [facility] . All indicating available beds based on phone call to their Intake Dept.  Beryle Flock, MS, CRC, [facility] Triage Specialist

## 2014-10-22 NOTE — ED Notes (Signed)
Pt AAO x 3, no distress noted, calm & cooperative, interactive with staff, stating he is nervous about going to new facility.  Monitoring for safety, Q 15 min checks in effect.

## 2014-10-22 NOTE — ED Notes (Signed)
Patient has been up and in the milieu this afternoon.  He was irritable at first, but calmed down after security spoke with him.  I then spent a great deal of time answering his questions and reassuring him.  He has been started back on some of his medications and states he feels a bit better.  Was on clonazepam 1 mg QID.  He was given 1 mg lorazepam this afternoon and at that time his palms were sweaty, but better now.

## 2014-10-22 NOTE — ED Notes (Signed)
Family at bedside at present 

## 2014-10-22 NOTE — ED Notes (Signed)
Patient denies SI, HI and AVH at this time. Encouragement and support provided and safety maintain. Q 15 min safety checks remain in place.  

## 2014-10-22 NOTE — Treatment Plan (Signed)
Mr. Early is declined at [facility] due to a recent history of threatening to kill Dr. Jama Flavors.

## 2015-05-06 ENCOUNTER — Emergency Department (HOSPITAL_COMMUNITY)
Admission: EM | Admit: 2015-05-06 | Discharge: 2015-05-08 | Disposition: A | Discharge status: Other Acute Inpatient Hospital | Payer: Medicaid Other | Attending: Emergency Medicine | Admitting: Emergency Medicine

## 2015-05-06 ENCOUNTER — Encounter (HOSPITAL_COMMUNITY): Payer: Self-pay | Admitting: Emergency Medicine

## 2015-05-06 DIAGNOSIS — F332 Major depressive disorder, recurrent severe without psychotic features: Secondary | ICD-10-CM | POA: Diagnosis not present

## 2015-05-06 DIAGNOSIS — Z791 Long term (current) use of non-steroidal anti-inflammatories (NSAID): Secondary | ICD-10-CM | POA: Diagnosis not present

## 2015-05-06 DIAGNOSIS — Y9289 Other specified places as the place of occurrence of the external cause: Secondary | ICD-10-CM | POA: Diagnosis not present

## 2015-05-06 DIAGNOSIS — F1310 Sedative, hypnotic or anxiolytic abuse, uncomplicated: Secondary | ICD-10-CM | POA: Diagnosis not present

## 2015-05-06 DIAGNOSIS — Y998 Other external cause status: Secondary | ICD-10-CM | POA: Insufficient documentation

## 2015-05-06 DIAGNOSIS — W260XXA Contact with knife, initial encounter: Secondary | ICD-10-CM | POA: Insufficient documentation

## 2015-05-06 DIAGNOSIS — Z79899 Other long term (current) drug therapy: Secondary | ICD-10-CM | POA: Diagnosis not present

## 2015-05-06 DIAGNOSIS — Y9389 Activity, other specified: Secondary | ICD-10-CM | POA: Diagnosis not present

## 2015-05-06 DIAGNOSIS — F4310 Post-traumatic stress disorder, unspecified: Secondary | ICD-10-CM | POA: Diagnosis present

## 2015-05-06 DIAGNOSIS — F419 Anxiety disorder, unspecified: Secondary | ICD-10-CM | POA: Insufficient documentation

## 2015-05-06 DIAGNOSIS — F17210 Nicotine dependence, cigarettes, uncomplicated: Secondary | ICD-10-CM | POA: Insufficient documentation

## 2015-05-06 DIAGNOSIS — Z23 Encounter for immunization: Secondary | ICD-10-CM | POA: Insufficient documentation

## 2015-05-06 DIAGNOSIS — F32A Depression, unspecified: Secondary | ICD-10-CM

## 2015-05-06 DIAGNOSIS — F329 Major depressive disorder, single episode, unspecified: Secondary | ICD-10-CM

## 2015-05-06 DIAGNOSIS — F1210 Cannabis abuse, uncomplicated: Secondary | ICD-10-CM | POA: Diagnosis not present

## 2015-05-06 DIAGNOSIS — S61512A Laceration without foreign body of left wrist, initial encounter: Secondary | ICD-10-CM | POA: Diagnosis not present

## 2015-05-06 DIAGNOSIS — R45851 Suicidal ideations: Secondary | ICD-10-CM | POA: Diagnosis present

## 2015-05-06 HISTORY — DX: Bipolar disorder, unspecified: F31.9

## 2015-05-06 LAB — CBC
HEMATOCRIT: 49.2 % (ref 39.0–52.0)
Hemoglobin: 16.7 g/dL (ref 13.0–17.0)
MCH: 33.1 pg (ref 26.0–34.0)
MCHC: 33.9 g/dL (ref 30.0–36.0)
MCV: 97.4 fL (ref 78.0–100.0)
Platelets: 273 10*3/uL (ref 150–400)
RBC: 5.05 MIL/uL (ref 4.22–5.81)
RDW: 13 % (ref 11.5–15.5)
WBC: 8.3 10*3/uL (ref 4.0–10.5)

## 2015-05-06 NOTE — ED Provider Notes (Addendum)
CSN: [id number]     Arrival date & time 05/06/15  2255 History  By signing my name below, I, Elon Spanner, attest that this documentation has been prepared under the direction and in the presence of Lezlee Gills, MD. Electronically Signed: Elon Spanner, ED Scribe. 05/06/2015. 11:41 PM.    Chief Complaint  Patient presents with  . Suicidal   Patient is a 45 y.o. male presenting with depression. The history is provided by the patient. History limited by: uncooperative psychosis. No language interpreter was used.  Depression This is a recurrent problem. The problem occurs constantly. Pertinent negatives include no chest pain and no abdominal pain. Nothing aggravates the symptoms. Nothing relieves the symptoms. He has tried nothing for the symptoms. The treatment provided no relief.  LEVEL 5 CAVEAT (UNCOOPERATIVENESS AND FRANK PSYCHOSIS) HPI Comments: Phillip [name] is a 45 y.o. male who is brought into ED by [employer], per nursing note, voluntarily for suicide attempt.  However the patient states the incident was actually a "knife accident" while sharpening a razor knife when it accidentally cut his left wrist.  He denies prior self-harm but reports previous behavioral health admissions. The patient states he his prescribed klonopin, tegratol, and Neurontin with no missed doses.  He denies alcohol or illicit drug use.  He denies auditory hallucinations. Patient lives alone.  Last tetanus unknown.  NKA.    Past Medical History  Diagnosis Date  . Anxiety   . Depression   . PTSD (post-traumatic stress disorder)   . Bipolar disorder [facility])    Past Surgical History  Procedure Laterality Date  . Cholesterol    . Wisdom tooth extraction     Family History  Problem Relation Age of Onset  . Cancer Mother   . Cancer Father   . Depression Father    Social History  Substance Use Topics  . Smoking status: Current Every Day Smoker -- 1.00 packs/day for 19 years    Types: Cigarettes  . Smokeless  tobacco: Never Used  . Alcohol Use: No    Review of Systems  Unable to perform ROS: Other (uncooperativeness and frank psychosis. )  Cardiovascular: Negative for chest pain.  Gastrointestinal: Negative for abdominal pain.  Psychiatric/Behavioral: Positive for depression.    Allergies  Mellaril  Home Medications   Prior to Admission medications   Medication Sig Start Date End Date Taking? Authorizing Provider  ARIPiprazole (ABILIFY) 15 MG tablet Take 0.5 tablets (7.5 mg total) by mouth at bedtime. For Depression/mood control Patient not taking: Reported on 09/16/2014 06/23/14   Sanjuana Kava, NP  carbamazepine (EQUETRO) 200 MG CP12 12 hr capsule Take 1 capsule (200 mg total) by mouth 2 (two) times daily. Mood stabilization 06/23/14   Sanjuana Kava, NP  clonazePAM (KLONOPIN) 1 MG tablet Take 1 tablet (1 mg total) by mouth 4 (four) times daily. 09/19/14   Gwyneth Sprout, MD  ibuprofen (ADVIL,MOTRIN) 400 MG tablet Take 1 tablet (400 mg total) by mouth every 6 (six) hours as needed. Patient not taking: Reported on 10/21/2014 08/12/14   Eyvonne Mechanic, PA-C  naproxen sodium (ANAPROX) 220 MG tablet Take 440 mg by mouth 2 (two) times daily with a meal.    Historical Provider, MD  nicotine (NICODERM CQ - DOSED IN MG/24 HOURS) 21 mg/24hr patch Place 1 patch (21 mg total) onto the skin daily. For nicotine addiction Patient not taking: Reported on 08/12/2014 06/23/14   Sanjuana Kava, NP  pantoprazole (PROTONIX) 20 MG tablet Take 1 tablet (20 mg  total) by mouth daily. Patient not taking: Reported on 08/12/2014 07/29/14   Charlestine Night, PA-C  prazosin (MINIPRESS) 1 MG capsule Take 3 capsules (3 mg total) by mouth at bedtime. For nightmares Patient taking differently: Take 3 mg by mouth daily as needed (For nightmares). For nightmares 06/23/14   Sanjuana Kava, NP  testosterone cypionate (DEPOTESTOTERONE CYPIONATE) 200 MG/ML injection Inject 0.5 mLs (100 mg total) into the muscle every 14 (fourteen) days.  For low testosterone replacement Patient not taking: Reported on 07/29/2014 06/23/14   Sanjuana Kava, NP   BP 130/96 mmHg  Pulse 76  Temp(Src) 97.9 F (36.6 C) (Oral)  Resp 20  SpO2 98% Physical Exam  Constitutional: He is oriented to person, place, and time. He appears well-developed and well-nourished. No distress.  HENT:  Head: Normocephalic and atraumatic.  Mouth/Throat: Oropharynx is clear and moist.  Eyes: Conjunctivae and EOM are normal.  Pupils 9 mm, sluggish.  Neck: Normal range of motion. Neck supple. No tracheal deviation present.  Cardiovascular: Normal rate and regular rhythm.   Pulmonary/Chest: Effort normal. No respiratory distress. He has no wheezes. He has no rales.  Abdominal: Soft. Bowel sounds are normal. There is no tenderness. There is no rebound and no guarding.  Musculoskeletal: Normal range of motion.  Neurological: He is alert and oriented to person, place, and time. He has normal reflexes.  Skin: Skin is warm and dry.  6 linear lacerations.  Central loss of tissue on the 4th one; 2"  Long.  Psychiatric: His speech is tangential.  Nursing note and vitals reviewed.   ED Course  Procedures (including critical care time)  DIAGNOSTIC STUDIES: Oxygen Saturation is 98% on RA, normal by my interpretation.    COORDINATION OF CARE:  11:45 PM Discussed treatment plan with patient at bedside.  Patient acknowledges and agrees with plan.    Labs Review Labs Reviewed  COMPREHENSIVE METABOLIC PANEL  ETHANOL  SALICYLATE LEVEL  ACETAMINOPHEN LEVEL  CBC  URINE RAPID DRUG SCREEN, HOSP PERFORMED    Imaging Review No results found. I have personally reviewed and evaluated these images and lab results as part of my medical decision-making.   EKG Interpretation None      MDM   Final diagnoses:  None    Patient is refusing to directly answer questions.  Cut wrist.  Will need inpatient stabilization.  Will seek commitment.    I personally performed the  services described in this documentation, which was scribed in my presence. The recorded information has been reviewed and is accurate.      Cy Blamer, MD 05/07/15 [id number]  Cy Blamer, MD 05/07/15 [id number]

## 2015-05-06 NOTE — ED Notes (Signed)
Pt was brought in by [employer] voluntarily with suicidal attempt  Per police pt used a knife and cut his left wrist  Pt has multiple cuts to his left wrist  Bleeding controlled  Pt states he was just playing with knives and cut his wrist by accident  Pt denies suicidal ideations at this time  Pt is cooperative

## 2015-05-07 ENCOUNTER — Encounter (HOSPITAL_COMMUNITY): Payer: Self-pay | Admitting: Emergency Medicine

## 2015-05-07 DIAGNOSIS — R45851 Suicidal ideations: Secondary | ICD-10-CM

## 2015-05-07 DIAGNOSIS — F332 Major depressive disorder, recurrent severe without psychotic features: Secondary | ICD-10-CM | POA: Diagnosis not present

## 2015-05-07 LAB — COMPREHENSIVE METABOLIC PANEL
ALT: 73 U/L — AB (ref 17–63)
ANION GAP: 13 (ref 5–15)
AST: 44 U/L — ABNORMAL HIGH (ref 15–41)
Albumin: 4.6 g/dL (ref 3.5–5.0)
Alkaline Phosphatase: 98 U/L (ref 38–126)
BUN: 14 mg/dL (ref 6–20)
CO2: 24 mmol/L (ref 22–32)
Calcium: 9.8 mg/dL (ref 8.9–10.3)
Chloride: 106 mmol/L (ref 101–111)
Creatinine, Ser: 0.84 mg/dL (ref 0.61–1.24)
GFR calc non Af Amer: >60 mL/min (ref >60)
Glucose, Bld: 108 mg/dL — ABNORMAL HIGH (ref 65–99)
POTASSIUM: 4.3 mmol/L (ref 3.5–5.1)
SODIUM: 143 mmol/L (ref 135–145)
Total Bilirubin: 0.3 mg/dL (ref 0.3–1.2)
Total Protein: 7.8 g/dL (ref 6.5–8.1)

## 2015-05-07 LAB — ETHANOL: Alcohol, Ethyl (B): <5 mg/dL (ref <5)

## 2015-05-07 LAB — RAPID URINE DRUG SCREEN, HOSP PERFORMED
Amphetamines: NOT DETECTED
Barbiturates: NOT DETECTED
Benzodiazepines: POSITIVE — AB
COCAINE: NOT DETECTED
Opiates: NOT DETECTED
Tetrahydrocannabinol: POSITIVE — AB

## 2015-05-07 LAB — ACETAMINOPHEN LEVEL

## 2015-05-07 LAB — SALICYLATE LEVEL

## 2015-05-07 MED ORDER — PANTOPRAZOLE SODIUM 20 MG PO TBEC
20.0000 mg | DELAYED_RELEASE_TABLET | Freq: Every day | ORAL | Status: DC
  Filled 2015-05-07 (×2): qty 1

## 2015-05-07 MED ORDER — NICOTINE 21 MG/24HR TD PT24
21.0000 mg | MEDICATED_PATCH | Freq: Every day | TRANSDERMAL | Status: DC
  Administered 2015-05-07 – 2015-05-08 (×2): 21 mg via TRANSDERMAL
  Filled 2015-05-07 (×2): qty 1

## 2015-05-07 MED ORDER — LORAZEPAM 1 MG PO TABS
1.0000 mg | ORAL_TABLET | Freq: Once | ORAL | Status: AC
  Administered 2015-05-07: 1 mg via ORAL
  Filled 2015-05-07: qty 1

## 2015-05-07 MED ORDER — BACITRACIN ZINC 500 UNIT/GM EX OINT
TOPICAL_OINTMENT | Freq: Two times a day (BID) | CUTANEOUS | Status: DC
  Administered 2015-05-07: 1 via TOPICAL
  Administered 2015-05-08: 10:00:00 via TOPICAL
  Filled 2015-05-07: qty 28.35
  Filled 2015-05-07: qty 0.9
  Filled 2015-05-07: qty 28.35

## 2015-05-07 MED ORDER — TETANUS-DIPHTH-ACELL PERTUSSIS 5-2.5-18.5 LF-MCG/0.5 IM SUSP
0.5000 mL | Freq: Once | INTRAMUSCULAR | Status: AC
  Administered 2015-05-07: 0.5 mL via INTRAMUSCULAR
  Filled 2015-05-07: qty 0.5

## 2015-05-07 MED ORDER — ARIPIPRAZOLE 15 MG PO TABS
7.5000 mg | ORAL_TABLET | Freq: Every day | ORAL | Status: DC
  Filled 2015-05-07 (×2): qty 1

## 2015-05-07 MED ORDER — ALUM & MAG HYDROXIDE-SIMETH 200-200-20 MG/5ML PO SUSP: 30.0000 mL | ORAL | Status: DC | PRN

## 2015-05-07 MED ORDER — IBUPROFEN 200 MG PO TABS: 600.0000 mg | ORAL_TABLET | Freq: Three times a day (TID) | ORAL | Status: DC | PRN

## 2015-05-07 MED ORDER — CARBAMAZEPINE ER 200 MG PO CP12
200.0000 mg | ORAL_CAPSULE | Freq: Two times a day (BID) | ORAL | Status: DC
  Administered 2015-05-08: 200 mg via ORAL
  Filled 2015-05-07 (×4): qty 1

## 2015-05-07 MED ORDER — PRAZOSIN HCL 1 MG PO CAPS: 3.0000 mg | ORAL_CAPSULE | Freq: Every day | ORAL | Status: DC

## 2015-05-07 MED ORDER — HYDROXYZINE HCL 25 MG PO TABS: 25.0000 mg | ORAL_TABLET | Freq: Three times a day (TID) | ORAL | Status: DC | PRN

## 2015-05-07 MED ORDER — ONDANSETRON HCL 4 MG PO TABS: 4.0000 mg | ORAL_TABLET | Freq: Three times a day (TID) | ORAL | Status: DC | PRN

## 2015-05-07 MED ORDER — ACETAMINOPHEN 325 MG PO TABS: 650.0000 mg | ORAL_TABLET | ORAL | Status: DC | PRN

## 2015-05-07 NOTE — ED Notes (Signed)
Pt. Noted in room. No complaints or concerns voiced. No distress or abnormal behavior noted. Will continue to monitor with security cameras. Q 15 minute rounds continue. 

## 2015-05-07 NOTE — Consult Note (Signed)
[place] [facility] Consult   Reason for Consult:  Depression and suicidal ideation Referring Physician:  EDP Patient Identification: Phillip [name] MRN:  [id number] Principal Diagnosis: Major depressive disorder, recurrent, severe without psychotic features [facility]) Diagnosis:   Patient Active Problem List   Diagnosis Date Noted  . Major depressive disorder, recurrent, severe without psychotic features ([place]) [F33.2]     Priority: High  . Suicidal ideations [R45.851] 06/05/2014    Priority: High  . Panic disorder [F41.0] 05/19/2014    Priority: High  . MDD (major depressive disorder), recurrent, severe, without Psychosis [F33.3] 05/18/2014    Priority: High  . PTSD (post-traumatic stress disorder) [F43.10] 01/29/2014    Priority: High  . Generalized anxiety disorder [F41.1] 06/19/2011    Priority: High    Class: Chronic  . Suicidal ideation [R45.851]   . Major depression ([place]) [F32.9] 06/06/2014    Total Time spent with patient: 45 minutes  Subjective:   Phillip [name] is a 45 y.o. male patient admitted with increasing depression and suicidal ideation.  HPI:  Patient is a 45 year-old Caucasian male admitted to the emergency department with increasing depression and suicidal ideation. Patient is guarded on assessment and provided minimal information stating "Just look at what your other people asked. It should be in there." When questioned further about his depression, patient became irritable and stated "I'm not talking about that with you." Patient reports having suicidal ideation with a plan to "get a gun and shoot myself". Patient denies having a gun or any other weapons in his house. Patient reports living alone and states trying several medications in the past, including Clonopin, Gabapentin, and Tegretol, but states that "they don't really work." Patient reports using cannabis regularly to help with his depressive symptoms. Patient denies homicidal ideation and auditory or  visual hallucinations. Patient denies alcohol use.  Past Psychiatric History: - Patient reports past history of psychiatric hospitalization but refuses to tell provider  Risk to Self: Suicidal Ideation: Yes-Currently Present Suicidal Intent: Yes-Currently Present Is patient at risk for suicide?: Yes Suicidal Plan?: Yes-Currently Present Specify Current Suicidal Plan: Shoot himself in the head Access to Means: Yes Specify Access to Suicidal Means: Says he has a gun in the home. What has been your use of drugs/alcohol within the last 12 months?: None How many times?:  (Multiple attempts.) Other Self Harm Risks: None Triggers for Past Attempts: Unpredictable Intentional Self Injurious Behavior: None Risk to Others: Homicidal Ideation: No Thoughts of Harm to Others: No Current Homicidal Intent: No Current Homicidal Plan: No Access to Homicidal Means: No Identified Victim: No one History of harm to others?: No Assessment of Violence: None Noted Violent Behavior Description: Pt saw combat in [facility] Does patient have access to weapons?: Yes (Comment) (Pt says he has a .45 gun.) Criminal Charges Pending?: No Does patient have a court date: No Prior Inpatient Therapy: Prior Inpatient Therapy: Yes Prior Therapy Dates: 2016 Prior Therapy Facilty/Provider(s): [facility], others Reason for Treatment: SI Prior Outpatient Therapy: Prior Outpatient Therapy: Yes Prior Therapy Dates: March 2016 to current Prior Therapy Facilty/Provider(s): [facility] Reason for Treatment: Therapy Does patient have an ACCT team?: No Does patient have Intensive In-House Services?  : No Does patient have [facility] services? : No Does patient have P4CC services?: No  Past Medical History:  Past Medical History  Diagnosis Date  . Anxiety   . Depression   . PTSD (post-traumatic stress disorder)   . Bipolar disorder [facility])     Past Surgical  History  Procedure Laterality Date  . Cholesterol    .  Wisdom tooth extraction     Family History:  Family History  Problem Relation Age of Onset  . Cancer Mother   . Cancer Father   . Depression Father    Family Psychiatric  History: None reported Social History:  History  Alcohol Use No     History  Drug Use  . Yes  . Special: Marijuana    Comment: seldom    Social History   Social History  . Marital Status: Married    Spouse Name: N/A  . Number of Children: N/A  . Years of Education: N/A   Social History Main Topics  . Smoking status: Current Every Day Smoker -- 1.00 packs/day for 19 years    Types: Cigarettes  . Smokeless tobacco: Never Used  . Alcohol Use: No  . Drug Use: Yes    Special: Marijuana     Comment: seldom  . Sexual Activity: Yes    Birth Control/ Protection: None   Other Topics Concern  . None   Social History Narrative   Additional Social History:    Allergies:   Allergies  Allergen Reactions  . Mellaril [Thioridazine] Swelling    Bilateral leg swelling    Labs:  Results for orders placed or performed during the hospital encounter of 05/06/15 (from the past 48 hour(s))  Comprehensive metabolic panel     Status: Abnormal   Collection Time: 05/06/15 11:39 PM  Result Value Ref Range   Sodium 143 135 - 145 mmol/L   Potassium 4.3 3.5 - 5.1 mmol/L   Chloride 106 101 - 111 mmol/L   CO2 24 22 - 32 mmol/L   Glucose, Bld 108 (H) 65 - 99 mg/dL   BUN 14 6 - 20 mg/dL   Creatinine, Ser 0.84 0.61 - 1.24 mg/dL   Calcium 9.8 8.9 - 10.3 mg/dL   Total Protein 7.8 6.5 - 8.1 g/dL   Albumin 4.6 3.5 - 5.0 g/dL   AST 44 (H) 15 - 41 U/L   ALT 73 (H) 17 - 63 U/L   Alkaline Phosphatase 98 38 - 126 U/L   Total Bilirubin 0.3 0.3 - 1.2 mg/dL   GFR calc non Af Amer >60 >60 mL/min   GFR calc Af Amer >60 >60 mL/min    Comment: (NOTE) The eGFR has been calculated using the CKD EPI equation. This calculation has not been validated in all clinical situations. eGFR's persistently <60 mL/min signify possible  Chronic Kidney Disease.    Anion gap 13 5 - 15  Ethanol (ETOH)     Status: None   Collection Time: 05/06/15 11:39 PM  Result Value Ref Range   Alcohol, Ethyl (B) <5 <5 mg/dL    Comment:        LOWEST DETECTABLE LIMIT FOR SERUM ALCOHOL IS 5 mg/dL FOR MEDICAL PURPOSES ONLY   Salicylate level     Status: None   Collection Time: 05/06/15 11:39 PM  Result Value Ref Range   Salicylate Lvl <1.6 2.8 - 30.0 mg/dL  Acetaminophen level     Status: Abnormal   Collection Time: 05/06/15 11:39 PM  Result Value Ref Range   Acetaminophen (Tylenol), Serum <10 (L) 10 - 30 ug/mL    Comment:        THERAPEUTIC CONCENTRATIONS VARY SIGNIFICANTLY. A RANGE OF 10-30 ug/mL MAY BE AN EFFECTIVE CONCENTRATION FOR MANY PATIENTS. HOWEVER, SOME ARE BEST TREATED AT CONCENTRATIONS OUTSIDE THIS RANGE. ACETAMINOPHEN CONCENTRATIONS >  150 ug/mL AT 4 HOURS AFTER INGESTION AND >50 ug/mL AT 12 HOURS AFTER INGESTION ARE OFTEN ASSOCIATED WITH TOXIC REACTIONS.   CBC     Status: None   Collection Time: 05/06/15 11:39 PM  Result Value Ref Range   WBC 8.3 4.0 - 10.5 K/uL   RBC 5.05 4.22 - 5.81 MIL/uL   Hemoglobin 16.7 13.0 - 17.0 g/dL   HCT 49.2 39.0 - 52.0 %   MCV 97.4 78.0 - 100.0 fL   MCH 33.1 26.0 - 34.0 pg   MCHC 33.9 30.0 - 36.0 g/dL   RDW 13.0 11.5 - 15.5 %   Platelets 273 150 - 400 K/uL  Urine rapid drug screen (hosp performed) (Not at [facility])     Status: Abnormal   Collection Time: 05/07/15  9:09 AM  Result Value Ref Range   Opiates NONE DETECTED NONE DETECTED   Cocaine NONE DETECTED NONE DETECTED   Benzodiazepines POSITIVE (A) NONE DETECTED   Amphetamines NONE DETECTED NONE DETECTED   Tetrahydrocannabinol POSITIVE (A) NONE DETECTED   Barbiturates NONE DETECTED NONE DETECTED    Comment:        DRUG SCREEN FOR MEDICAL PURPOSES ONLY.  IF CONFIRMATION IS NEEDED FOR ANY PURPOSE, NOTIFY LAB WITHIN 5 DAYS.        LOWEST DETECTABLE LIMITS FOR URINE DRUG SCREEN Drug Class       Cutoff  (ng/mL) Amphetamine      1000 Barbiturate      200 Benzodiazepine   948 Tricyclics       546 Opiates          300 Cocaine          300 THC              50     Current Facility-Administered Medications  Medication Dose Route Frequency Provider Last Rate Last Dose  . alum & mag hydroxide-simeth (MAALOX/MYLANTA) 200-200-20 MG/5ML suspension 30 mL  30 mL Oral PRN April Palumbo, MD      . ARIPiprazole (ABILIFY) tablet 7.5 mg  7.5 mg Oral QHS Corena Pilgrim, MD      . bacitracin ointment   Topical BID April Palumbo, MD   1 application at 27/03/50 0039  . carbamazepine (EQUETRO) 12 hr capsule 200 mg  200 mg Oral BID Corena Pilgrim, MD      . hydrOXYzine (ATARAX/VISTARIL) tablet 25 mg  25 mg Oral TID PRN Corena Pilgrim, MD      . ibuprofen (ADVIL,MOTRIN) tablet 600 mg  600 mg Oral Q8H PRN April Palumbo, MD      . nicotine (NICODERM CQ - dosed in mg/24 hours) patch 21 mg  21 mg Transdermal Daily Patrecia Pour, NP   21 mg at 05/07/15 1115  . ondansetron (ZOFRAN) tablet 4 mg  4 mg Oral Q8H PRN April Palumbo, MD      . pantoprazole (PROTONIX) EC tablet 20 mg  20 mg Oral Daily Corena Pilgrim, MD       Current Outpatient Prescriptions  Medication Sig Dispense Refill  . ARIPiprazole (ABILIFY) 15 MG tablet Take 0.5 tablets (7.5 mg total) by mouth at bedtime. For Depression/mood control (Patient not taking: Reported on 09/16/2014) 30 tablet 0  . carbamazepine (EQUETRO) 200 MG CP12 12 hr capsule Take 1 capsule (200 mg total) by mouth 2 (two) times daily. Mood stabilization (Patient not taking: Reported on 05/06/2015) 60 each 0  . clonazePAM (KLONOPIN) 1 MG tablet Take 1 tablet (1 mg total) by mouth 4 (four)  times daily. (Patient not taking: Reported on 05/06/2015) 12 tablet 0  . ibuprofen (ADVIL,MOTRIN) 400 MG tablet Take 1 tablet (400 mg total) by mouth every 6 (six) hours as needed. (Patient not taking: Reported on 10/21/2014) 30 tablet 0  . nicotine (NICODERM CQ - DOSED IN MG/24 HOURS) 21 mg/24hr patch  Place 1 patch (21 mg total) onto the skin daily. For nicotine addiction (Patient not taking: Reported on 08/12/2014) 28 patch 0  . pantoprazole (PROTONIX) 20 MG tablet Take 1 tablet (20 mg total) by mouth daily. (Patient not taking: Reported on 08/12/2014) 14 tablet 0  . prazosin (MINIPRESS) 1 MG capsule Take 3 capsules (3 mg total) by mouth at bedtime. For nightmares (Patient taking differently: Take 3 mg by mouth daily as needed (For nightmares). For nightmares) 30 capsule 0  . testosterone cypionate (DEPOTESTOTERONE CYPIONATE) 200 MG/ML injection Inject 0.5 mLs (100 mg total) into the muscle every 14 (fourteen) days. For low testosterone replacement (Patient not taking: Reported on 07/29/2014) 10 mL 0    Musculoskeletal: Strength & Muscle Tone: within normal limits Gait & Station: normal Patient leans: N/A  Psychiatric Specialty Exam: Review of Systems  Constitutional: Negative.   HENT: Negative.   Eyes: Negative.   Respiratory: Negative.   Cardiovascular: Negative.   Gastrointestinal: Negative.   Genitourinary: Negative.   Musculoskeletal: Negative.   Skin: Negative.   Neurological: Negative.   Endo/Heme/Allergies: Negative.   Psychiatric/Behavioral: Positive for depression and suicidal ideas.    Blood pressure 116/81, pulse 76, temperature 98 F (36.7 C), temperature source Oral, resp. rate 16, SpO2 97 %.There is no weight on file to calculate BMI.  General Appearance: Guarded and Neat  Eye Contact::  Fair  Speech:  Slow  Volume:  Decreased  Mood:  Depressed  Affect:  Congruent  Thought Process:  Goal Directed, Linear and Logical  Orientation:  Full (Time, Place, and Person)  Thought Content:  NA  Suicidal Thoughts:  Yes.  with intent/plan  Homicidal Thoughts:  No  Memory:  Immediate;   Fair Recent;   Fair Remote;   Fair  Judgement:  Poor  Insight:  Lacking  Psychomotor Activity:  Normal  Concentration:  Poor  Recall:  [employer] of Knowledge:Fair  Language: Fair   Akathisia:  NA  Handed:  Right  AIMS (if indicated):     Assets:  Communication Skills Desire for Improvement Physical Health  ADL's:  Intact  Cognition: WNL  Sleep:      Treatment Plan Summary: Daily contact with patient to assess and evaluate symptoms and progress in treatment, Medication management and Diagnosis: Major Depressive Disorder, recurrent episode, severe without psychotic features -Crisis Stabilization -Individual Counseling -Medication Management:  Start:  Abilify 7.16m QHS for mood stabilization  Carbamazepine 2040mBID for mood stabilization  Vistaril 2568mID PRN for anxiety    Disposition: Recommend psychiatric Inpatient admission when medically cleared.  [facility]   LORWaylan BogaP 05/07/2015 11:34 AM Patient seen face-to-face for psychiatric evaluation, chart reviewed and case discussed with the physician extender and developed treatment plan. Reviewed the information documented and agree with the treatment plan. MojCorena PilgrimD

## 2015-05-07 NOTE — BH Assessment (Signed)
[facility] Assessment Progress Note  The following facilities have been contacted to seek placement for this pt, with results as noted:  Beds available, information sent, decision pending:  [facility]   Declined:  [facility]   At capacity:  [facility]   Doylene Canning, [place] Triage Specialist [phone]

## 2015-05-07 NOTE — Progress Notes (Signed)
Pt. Noted in room. No complaints or concerns voiced. No distress or abnormal behavior noted. Will continue to monitor with security cameras. Q 15 minute rounds continue. 

## 2015-05-07 NOTE — Progress Notes (Signed)
Pt has been denied for admission at [facility].   Consulting psychiatrist: Dr Toni Amend.   05/07/2015 Cheryl Flash, MS, NCC, LPCA Therapeutic Triage Specialist

## 2015-05-07 NOTE — ED Notes (Signed)
Left wrist cleaned  Bacitracin oint applied, telfa and kling dressing applied

## 2015-05-07 NOTE — Progress Notes (Deleted)
Pending review for possible placement with [facility].  

## 2015-05-07 NOTE — ED Notes (Signed)
Phillip [name] with TTS in to see pt

## 2015-05-07 NOTE — ED Notes (Signed)
Pt is alert and oriented x4. 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 Support, encouragement, and safe environment provided.  15-minute safety checks continue.

## 2015-05-07 NOTE — Progress Notes (Signed)
Pending review for possible placement with [facility].   05/07/2015 Cheryl Flash, MS, NCC, LPCA Therapeutic Triage Specialist

## 2015-05-07 NOTE — Progress Notes (Addendum)
Entered in d/c instructions  Lovenia Kim On 05/10/2015 This is your assigned Medicaid [place] access doctor If you prefer another contact DSS [phone] DSS assigned your doctor *You may receive a bill if you go to any family Dr not assigned to you [address] [facility] [address] [place] [address] [phone] Medicaid [place] Access Covered Patient [place]: [phone] [address] [place], [place] [address] [email or web address] Use this website to assist with understanding your coverage & to renew application As a Medicaid client you MUST contact DSS/SSI each time you change address, move to another [place] county or another state to keep your address updated [place] Medicaid Transportation to Dr appts if you are have full Medicaid: [phone], [phone]

## 2015-05-07 NOTE — ED Notes (Signed)
Report received from [facility] RN. Pt. Alert and oriented in no distress denies SI, HI, AVH and pain.  Pt. Instructed to come to me with problems or concerns.Will continue to monitor for safety via security cameras and Q 15 minute checks.
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
## 2015-05-08 NOTE — ED Provider Notes (Signed)
PT has been accepted by Dr. Enedina Finner at [facility], [place] 05/08/15 [phone]

## 2015-05-08 NOTE — BH Assessment (Signed)
Received call from Lupita Leash Scholar at [facility] stating that Pt has been accepted to their facility by Dr. Rex Kras. Ms. Scholar requests nursing report be called to [phone] when Pt is ready for transport. Notified Rolan Bucco and Marita Snellen, RN of acceptance.   Harlin Rain Patsy Baltimore, LPC, [facility], [facility] Triage Specialist [phone]

## 2015-05-08 NOTE — ED Notes (Signed)
Up on the phone 

## 2015-05-08 NOTE — ED Notes (Addendum)
Pt. Noted in room. No complaints or concerns voiced. No distress or abnormal behavior noted. Will continue to monitor with security cameras. Q 15 minute rounds continue. 

## 2015-05-08 NOTE — ED Notes (Signed)
Sheriff will be in approx 1130a

## 2015-05-08 NOTE — ED Notes (Signed)
Lac's cleaned w/ sterile NS, bacitracin/dry dressing applied

## 2015-05-08 NOTE — ED Notes (Signed)
Pt. Noted in room. No complaints or concerns voiced. No distress or abnormal behavior noted. Will continue to monitor with security cameras. Q 15 minute rounds continue. 

## 2015-05-08 NOTE — ED Notes (Signed)
Sheriff is here to transport 

## 2015-05-08 NOTE — ED Notes (Signed)
Sheriff called and is aware of transport, will be later this AM. Will call approx 1 hr prior to arrival

## 2015-05-08 NOTE — ED Notes (Addendum)
Up to the bathroom to shower and change scrubs.  Pt ate approx 80% of breakfast

## 2015-05-08 NOTE — ED Notes (Signed)
Dressing removed prior to breakfast, multiple superficial lac's noted lt wrist, no bleeding/swelling noted.

## 2015-05-08 NOTE — ED Notes (Addendum)
Pt ambulatory w/o difficulty to [place] w/ sheriff.  IVC papers, MAR, EMTELA, transfer report, assessment, face sheet, belongings  and bagged lunch for patient given to sheriff.  [facility]  updated that pt is in transit.

## 2015-05-08 NOTE — ED Notes (Signed)
Pt. Noted sleeping in room. No complaints or concerns voiced. No distress or abnormal behavior noted. Will continue to monitor with security cameras. Q 15 minute rounds continue. 

## 2015-05-08 NOTE — ED Notes (Signed)
Up to the bathroom 

## 2016-06-03 ENCOUNTER — Inpatient Hospital Stay (HOSPITAL_COMMUNITY)
Admission: AD | Admit: 2016-06-03 | Discharge: 2016-06-08 | DRG: 885 | Disposition: A | Discharge status: Home / Self Care | Payer: Medicaid Other | Attending: Psychiatry | Admitting: Psychiatry

## 2016-06-03 ENCOUNTER — Encounter (HOSPITAL_COMMUNITY): Payer: Self-pay | Admitting: *Deleted

## 2016-06-03 ENCOUNTER — Emergency Department (HOSPITAL_COMMUNITY)
Admission: EM | Admit: 2016-06-03 | Discharge: 2016-06-03 | Disposition: A | Discharge status: Other Acute Inpatient Hospital | Payer: Medicaid Other | Attending: Emergency Medicine | Admitting: Emergency Medicine

## 2016-06-03 ENCOUNTER — Encounter (HOSPITAL_COMMUNITY): Payer: Self-pay | Admitting: Emergency Medicine

## 2016-06-03 DIAGNOSIS — F909 Attention-deficit hyperactivity disorder, unspecified type: Secondary | ICD-10-CM | POA: Diagnosis present

## 2016-06-03 DIAGNOSIS — F1290 Cannabis use, unspecified, uncomplicated: Secondary | ICD-10-CM | POA: Diagnosis not present

## 2016-06-03 DIAGNOSIS — Z888 Allergy status to other drugs, medicaments and biological substances status: Secondary | ICD-10-CM

## 2016-06-03 DIAGNOSIS — G4700 Insomnia, unspecified: Secondary | ICD-10-CM | POA: Diagnosis present

## 2016-06-03 DIAGNOSIS — F332 Major depressive disorder, recurrent severe without psychotic features: Secondary | ICD-10-CM | POA: Diagnosis not present

## 2016-06-03 DIAGNOSIS — Z809 Family history of malignant neoplasm, unspecified: Secondary | ICD-10-CM

## 2016-06-03 DIAGNOSIS — F410 Panic disorder [episodic paroxysmal anxiety] without agoraphobia: Secondary | ICD-10-CM | POA: Diagnosis present

## 2016-06-03 DIAGNOSIS — F17200 Nicotine dependence, unspecified, uncomplicated: Secondary | ICD-10-CM | POA: Diagnosis present

## 2016-06-03 DIAGNOSIS — R45851 Suicidal ideations: Secondary | ICD-10-CM

## 2016-06-03 DIAGNOSIS — F17210 Nicotine dependence, cigarettes, uncomplicated: Secondary | ICD-10-CM | POA: Insufficient documentation

## 2016-06-03 DIAGNOSIS — F4310 Post-traumatic stress disorder, unspecified: Secondary | ICD-10-CM | POA: Diagnosis present

## 2016-06-03 DIAGNOSIS — Z818 Family history of other mental and behavioral disorders: Secondary | ICD-10-CM

## 2016-06-03 DIAGNOSIS — F4001 Agoraphobia with panic disorder: Secondary | ICD-10-CM | POA: Diagnosis present

## 2016-06-03 DIAGNOSIS — Z79899 Other long term (current) drug therapy: Secondary | ICD-10-CM | POA: Insufficient documentation

## 2016-06-03 DIAGNOSIS — F4000 Agoraphobia, unspecified: Secondary | ICD-10-CM | POA: Diagnosis present

## 2016-06-03 LAB — CBC WITH DIFFERENTIAL/PLATELET
BASOS ABS: 0 10*3/uL (ref 0.0–0.1)
Basophils Relative: 0 %
EOS ABS: 0.3 10*3/uL (ref 0.0–0.7)
EOS PCT: 3 %
HEMATOCRIT: 47.5 % (ref 39.0–52.0)
HEMOGLOBIN: 16.3 g/dL (ref 13.0–17.0)
LYMPHS ABS: 3.4 10*3/uL (ref 0.7–4.0)
LYMPHS PCT: 41 %
MCH: 32.9 pg (ref 26.0–34.0)
MCHC: 34.3 g/dL (ref 30.0–36.0)
MCV: 95.8 fL (ref 78.0–100.0)
MONOS PCT: 11 %
Monocytes Absolute: 0.9 10*3/uL (ref 0.1–1.0)
Neutro Abs: 3.7 10*3/uL (ref 1.7–7.7)
Neutrophils Relative %: 45 %
Platelets: 250 10*3/uL (ref 150–400)
RBC: 4.96 MIL/uL (ref 4.22–5.81)
RDW: 12.8 % (ref 11.5–15.5)
WBC: 8.3 10*3/uL (ref 4.0–10.5)

## 2016-06-03 LAB — I-STAT CHEM 8, ED
BUN: 24 mg/dL — AB (ref 6–20)
CREATININE: 1.2 mg/dL (ref 0.61–1.24)
Calcium, Ion: 1.09 mmol/L — ABNORMAL LOW (ref 1.15–1.40)
Chloride: 102 mmol/L (ref 101–111)
Glucose, Bld: 122 mg/dL — ABNORMAL HIGH (ref 65–99)
HEMATOCRIT: 49 % (ref 39.0–52.0)
Hemoglobin: 16.7 g/dL (ref 13.0–17.0)
POTASSIUM: 4 mmol/L (ref 3.5–5.1)
Sodium: 142 mmol/L (ref 135–145)
TCO2: 30 mmol/L (ref 0–100)

## 2016-06-03 LAB — RAPID URINE DRUG SCREEN, HOSP PERFORMED
AMPHETAMINES: NOT DETECTED
BARBITURATES: NOT DETECTED
Benzodiazepines: POSITIVE — AB
COCAINE: NOT DETECTED
Opiates: NOT DETECTED
TETRAHYDROCANNABINOL: NOT DETECTED

## 2016-06-03 LAB — HEPATIC FUNCTION PANEL
ALT: 58 U/L (ref 17–63)
AST: 36 U/L (ref 15–41)
Albumin: 4 g/dL (ref 3.5–5.0)
Alkaline Phosphatase: 103 U/L (ref 38–126)
BILIRUBIN DIRECT: 0.3 mg/dL (ref 0.1–0.5)
BILIRUBIN TOTAL: 0.7 mg/dL (ref 0.3–1.2)
Indirect Bilirubin: 0.4 mg/dL (ref 0.3–0.9)
Total Protein: 7 g/dL (ref 6.5–8.1)

## 2016-06-03 LAB — ACETAMINOPHEN LEVEL: Acetaminophen (Tylenol), Serum: <10 ug/mL — ABNORMAL LOW (ref 10–30)

## 2016-06-03 LAB — SALICYLATE LEVEL

## 2016-06-03 LAB — ETHANOL: Alcohol, Ethyl (B): <5 mg/dL (ref <5)

## 2016-06-03 MED ORDER — QUETIAPINE FUMARATE 25 MG PO TABS
25.0000 mg | ORAL_TABLET | Freq: Once | ORAL | Status: AC | PRN
  Administered 2016-06-03: 25 mg via ORAL
  Filled 2016-06-03: qty 1

## 2016-06-03 MED ORDER — CARBAMAZEPINE ER 200 MG PO TB12
200.0000 mg | ORAL_TABLET | Freq: Two times a day (BID) | ORAL | Status: DC
Start: 2016-06-03 — End: 2016-06-03
  Administered 2016-06-03: 200 mg via ORAL
  Filled 2016-06-03: qty 1

## 2016-06-03 MED ORDER — ACETAMINOPHEN 325 MG PO TABS
650.0000 mg | ORAL_TABLET | Freq: Four times a day (QID) | ORAL | Status: DC | PRN
  Administered 2016-06-04: 650 mg via ORAL
  Filled 2016-06-03: qty 2

## 2016-06-03 MED ORDER — NICOTINE 21 MG/24HR TD PT24
21.0000 mg | MEDICATED_PATCH | Freq: Every day | TRANSDERMAL | Status: DC
  Administered 2016-06-03: 21 mg via TRANSDERMAL
  Filled 2016-06-03: qty 1

## 2016-06-03 MED ORDER — PANTOPRAZOLE SODIUM 20 MG PO TBEC
20.0000 mg | DELAYED_RELEASE_TABLET | Freq: Every day | ORAL | Status: DC
  Administered 2016-06-05 – 2016-06-08 (×4): 20 mg via ORAL
  Filled 2016-06-03 (×8): qty 1

## 2016-06-03 MED ORDER — ACETAMINOPHEN 325 MG PO TABS: 650.0000 mg | ORAL_TABLET | ORAL | Status: DC | PRN

## 2016-06-03 MED ORDER — IBUPROFEN 400 MG PO TABS: 600.0000 mg | ORAL_TABLET | Freq: Three times a day (TID) | ORAL | Status: DC | PRN

## 2016-06-03 MED ORDER — ARIPIPRAZOLE 15 MG PO TABS
7.5000 mg | ORAL_TABLET | Freq: Every day | ORAL | Status: DC
  Administered 2016-06-03: 7.5 mg via ORAL
  Filled 2016-06-03 (×3): qty 1

## 2016-06-03 MED ORDER — ALUM & MAG HYDROXIDE-SIMETH 200-200-20 MG/5ML PO SUSP: 30.0000 mL | ORAL | Status: DC | PRN

## 2016-06-03 MED ORDER — ONDANSETRON HCL 4 MG PO TABS: 4.0000 mg | ORAL_TABLET | Freq: Three times a day (TID) | ORAL | Status: DC | PRN

## 2016-06-03 MED ORDER — CARBAMAZEPINE ER 200 MG PO TB12
200.0000 mg | ORAL_TABLET | Freq: Two times a day (BID) | ORAL | Status: DC
  Administered 2016-06-03 – 2016-06-04 (×2): 200 mg via ORAL
  Filled 2016-06-03 (×6): qty 1

## 2016-06-03 MED ORDER — ALUM & MAG HYDROXIDE-SIMETH 200-200-20 MG/5ML PO SUSP
30.0000 mL | ORAL | Status: DC | PRN
  Administered 2016-06-05 – 2016-06-08 (×7): 30 mL via ORAL
  Filled 2016-06-03 (×7): qty 30

## 2016-06-03 MED ORDER — CLONAZEPAM 0.5 MG PO TABS
1.0000 mg | ORAL_TABLET | Freq: Three times a day (TID) | ORAL | Status: DC
  Administered 2016-06-03: 1 mg via ORAL
  Filled 2016-06-03: qty 2

## 2016-06-03 MED ORDER — MAGNESIUM HYDROXIDE 400 MG/5ML PO SUSP: 30.0000 mL | Freq: Every day | ORAL | Status: DC | PRN

## 2016-06-03 MED ORDER — CLONAZEPAM 0.5 MG PO TABS
0.5000 mg | ORAL_TABLET | Freq: Three times a day (TID) | ORAL | Status: DC | PRN
  Administered 2016-06-03 – 2016-06-04 (×2): 0.5 mg via ORAL
  Filled 2016-06-03 (×2): qty 1

## 2016-06-03 MED ORDER — TRAZODONE HCL 50 MG PO TABS
50.0000 mg | ORAL_TABLET | Freq: Every evening | ORAL | Status: DC | PRN
  Filled 2016-06-03 (×2): qty 1

## 2016-06-03 NOTE — ED Notes (Signed)
[facility] aware [facility] - pt voiced understanding of tx plan and that he is under IVC. Pt asked for RN to notify Lucien MonsHeather, GF. RN advised her via phone.

## 2016-06-03 NOTE — ED Notes (Signed)
Regular Diet has been ordered for patient . 

## 2016-06-03 NOTE — ED Notes (Addendum)
Pt ambulatory to F7 wearing paper scrubs and socks. 2 labeled belongings bags -  Placed at nurses' desk for inventory. Pt alert, oriented, cooperative, calm. IVC'd by girlfriend - Dr Madilyn Hookees to complete 1st exam. Pt voiced understanding and signed Medical Clearance Pt Policy form - copy given to pt.

## 2016-06-03 NOTE — Progress Notes (Signed)
[facility] been [facility], to Dr. Jama Flavorsobos, to 403 bed 2. Bed is ready now. Jerrol Bananaebecca Berman [facility] RN has been informed.  [place] Abtsatia Yao [place], LCSWA Disposition staff 06/03/2016 11:15 AM
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
## 2016-06-03 NOTE — ED Triage Notes (Signed)
[facility]. Pt states he has been more depressed lately, but he is not HI or SI. Pt states he lost a few family friends recently and that is why is depressed at the moment.

## 2016-06-03 NOTE — ED Notes (Signed)
Pt's girlfriend leaving at this time. States they have only been dating for about a month and she does not feel comfortable taking his belongings even though pt advised OK. States pt had advised her last evening of his intent to kill himself but chose not to do so because of her. States then he became upset after she told him of a former male friend calling her. States she then called the Suicide Hotline and the police came.

## 2016-06-03 NOTE — BH Assessment (Signed)
Assessment Note  Phillip [name] is an 46 y.o. male, who was IVCd by Girl Friend and transported to [facility] by [facility].  Patient presented orientated x3, mood "depressed and sad", affect congruent with mood and flat.  Patient denied current SI, HI, AVH.  Patient reports feeling depressed and sad because 2 Friends have died in the past 2 months.  He reports self isolating, sleeping 6 to 10 hours per night and experiencing nightmares from [employer] PTSD.  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  Patient has [facility] service [facility] for [facility] loss.   Patient reports currently receiving counseling from [facility] and receiving medication from PCP.  Patient reports currently being prescribed Tegretol 200 mg 2x per day and Klonopin .5 mg 3x per day.  Patient denied misusing Klonopin and taking as prescribed.  Patient denied current substance use including alcohol.  Patient was previously hospitalized at [facility]:  04-2014 for anxiety and 2015 for overdose.  Patient gave verbal permission for Girl Delorise Royals [phone], to be contacted for collateral information.  Collateral information obtained from [facility], who took out IVC.  Ms. Kathryne Eriksson reports calling the Crisis Line to find out how to help the Patient after he was stating "I'm going to kill myself.. I will shoot myself..I'm tired."  Ms. Mechum reports only being in a relationship with the Patient for the past month and reports he has mentioned "killing himself" several times during their short relationship.  Ms. Kathryne Eriksson reports the Patient broke up with her on Tuesday, 05-30-16, however they have had telephone contact since that time.  Ms. Kathryne Eriksson reports feeling the Patient is a danger to himself even though he is currently denying it.  Disposition:  Patient meets inpatient criteria and placement will be sought.    Diagnosis: Major Depressive Disorder, recurrent, severe  Past Medical History:  Past  Medical History:  Diagnosis Date  . Anxiety   . Bipolar disorder (HCC)   . Depression   . PTSD (post-traumatic stress disorder)     Past Surgical History:  Procedure Laterality Date  . cholesterol    . WISDOM TOOTH EXTRACTION      Family History:  Family History  Problem Relation Age of Onset  . Cancer Mother   . Cancer Father   . Depression Father     Social History:  reports that he has been smoking Cigarettes.  He has a 19.00 pack-year smoking history. He has never used smokeless tobacco. He reports that he uses drugs, including Marijuana. He reports that he does not drink alcohol.  Additional Social History:     CIWA: CIWA-Ar BP: 130/88 Pulse Rate: 86 COWS:    Allergies:  Allergies  Allergen Reactions  . Mellaril [Thioridazine] Swelling    Bilateral leg swelling  . Testosterone     Other reaction(s): Anaphylaxis    Home Medications:  (Not in a hospital admission)  OB/GYN Status:  No LMP for male patient.  General Assessment Data Location of Assessment: [facility] ED TTS Assessment: In system Is this a Tele or Face-to-Face Assessment?: Tele Assessment Is this an Initial Assessment or a Re-assessment for this encounter?: Initial Assessment Marital status: Divorced (1x) Maiden name: N/A Is patient pregnant?: No Pregnancy Status: No Living Arrangements: Alone (lives with Girl Friend) Can pt return to current living arrangement?: Yes Admission Status: Involuntary Is patient capable of signing voluntary admission?: Yes Referral Source: Self/Family/Friend Insurance type: Firefighter Exam [facility]  Walk-in ONLY) Medical Exam completed: Yes  Crisis Care Plan Living Arrangements: Alone (lives with Girl Friend) Legal Guardian: Other: (Self) Name of Psychiatrist: None Name of Therapist: [facility]  Education Status Is patient currently in school?: No Current Grade: N/A Highest grade of school patient has  completed: 12 Name of school: N/A Contact person: N/A  Risk to self with the past 6 months Suicidal Ideation: No-Not Currently/Within Last 6 Months Has patient been a risk to self within the past 6 months prior to admission? : Yes Suicidal Intent: No-Not Currently/Within Last 6 Months Has patient had any suicidal intent within the past 6 months prior to admission? : Yes Is patient at risk for suicide?: Yes Suicidal Plan?: No-Not Currently/Within Last 6 Months Has patient had any suicidal plan within the past 6 months prior to admission? : Yes Access to Means: No What has been your use of drugs/alcohol within the last 12 months?: Patient denied Previous Attempts/Gestures: Yes How many times?: 1 Other Self Harm Risks: No Triggers for Past Attempts: Other (Comment) (Depression) Intentional Self Injurious Behavior: None Family Suicide History: Unknown Recent stressful life event(s): Loss (Comment) (Patient reports 2 Friends died in the past 2 months) Persecutory voices/beliefs?: No Depression: Yes Depression Symptoms: Isolating, Insomnia (Sadness) Substance abuse history and/or treatment for substance abuse?: No Suicide prevention information given to non-admitted patients: Not applicable  Risk to Others within the past 6 months Homicidal Ideation: No Does patient have any lifetime risk of violence toward others beyond the six months prior to admission? : No Thoughts of Harm to Others: No Current Homicidal Intent: No Current Homicidal Plan: No Access to Homicidal Means: No Identified Victim: N/A History of harm to others?: No Assessment of Violence: None Noted Violent Behavior Description: N/A Does patient have access to weapons?: No (Patient denied) Criminal Charges Pending?: No Does patient have a court date: No Is patient on probation?: No  Psychosis Hallucinations: None noted Delusions: None noted  Mental Status Report Appearance/Hygiene: In hospital gown Eye Contact:  Fair Motor Activity: Unremarkable Speech: Logical/coherent Level of Consciousness: Alert Mood: Depressed, Sad Affect: Flat, Depressed, Sad Anxiety Level: Moderate Thought Processes: Coherent, Relevant Judgement: Impaired Orientation: Person, Place, Time Obsessive Compulsive Thoughts/Behaviors: None  Cognitive Functioning Concentration: Good Memory: Recent Intact, Remote Intact IQ: Average Insight: Poor Impulse Control: Fair Appetite: Good Weight Loss: 0 Weight Gain: 0 Sleep: No Change Total Hours of Sleep: 6 Vegetative Symptoms: None  ADLScreening [facility] Assessment Services) Patient's cognitive ability adequate to safely complete daily activities?: Yes Patient able to express need for assistance with ADLs?: Yes Independently performs ADLs?: Yes (appropriate for developmental age)  Prior Inpatient Therapy Prior Inpatient Therapy: Yes Prior Therapy Dates: 2016 and 2015 [facility] Reason for Treatment: Overdose and anxiety  Prior Outpatient Therapy Prior Outpatient Therapy: Yes Prior Therapy Dates: 2013 [facility] Reason for Treatment: Depression Does patient have an ACCT team?: No Does patient have Intensive In-House Services?  : No Does patient have [facility] services? : No Does patient have P4CC services?: No  ADL Screening (condition at time of admission) Patient's cognitive ability adequate to safely complete daily activities?: Yes Is the patient deaf or have difficulty hearing?: Yes (Patient reports hearing loss and [place] [facility]) Does the patient have difficulty seeing, even when wearing glasses/contacts?: No Does the patient have difficulty concentrating, remembering, or making decisions?: No Patient able to express need for assistance with ADLs?: Yes Does the patient have difficulty  dressing or bathing?: No Independently performs ADLs?: Yes (appropriate for developmental age) Does the  patient have difficulty walking or climbing stairs?: No Weakness of Legs: None Weakness of Arms/Hands: None  Home Assistive Devices/Equipment Home Assistive Devices/Equipment: None    Abuse/Neglect Assessment (Assessment to be complete while patient is alone) Physical Abuse: Denies Verbal Abuse: Denies Sexual Abuse: Denies Exploitation of patient/patient's resources: Denies Self-Neglect: Denies Values / Beliefs Cultural Requests During Hospitalization: None Spiritual Requests During Hospitalization: None   Advance Directives (For Healthcare) Does Patient Have a Medical Advance Directive?: No Would patient like information on creating a medical advance directive?: No - Patient declined    Additional Information 1:1 In Past 12 Months?: No CIRT Risk: No Elopement Risk: No Does patient have medical clearance?: Yes     Disposition:  Disposition Initial Assessment Completed for this Encounter: Yes Disposition of Patient:  (Pending disposition)  On Site Evaluation by:   Reviewed with Physician:    Dey-Johnson,Leilyn Frayre 06/03/2016 8:37 AM

## 2016-06-03 NOTE — Procedures (Signed)
Phillip [name] is a 46 yo caucasian male who is admitted IVC'd ( by his GF after she claims he threatened to shoot himself) [facility] due to increasing depression with SI due to his bipolar disorder. He is very flat, quiet,, unengaged in the admission process. He gives one-word answers , he is not forthcoming when he answers, stating " I know this is the end"" I can't live with myself after what I did", yet he cannot explain these statements when writer asks him to. He says he stopped his tegretol " because it wasn't doing any good". He says he ruminates, he worries constantly, and that " the nightmares I have are awful". He says he has been experiencing increasing feelings of helplessness, hopelessness and inability to leave his house over the past several weeks. He reports he does have access to guns at his home, but says " I would never let  Anybody I know find me...". He maintains poor eye contact . He says " I feel like this is the end" but is unable to explain this to Clinical research associatewriter. He denies active suicidal ideation but says " what's the use". He is willing to contract with this Clinical research associatewriter ... to not hurt self while in the hospital. He is very jumpy and nervous, yet flat and removed. After admission is complete, he is oriented to the unit and shown to his room.

## 2016-06-03 NOTE — ED Notes (Addendum)
Pt's girlfriend, Lavona MoundHeather Meachum, [phone] - called and advised she is on her way to visit w/pt. Advised her of visitation policy.

## 2016-06-03 NOTE — ED Provider Notes (Signed)
Patient evaluated for transfer. EMTALA completed. Patient is alert and nontoxic. He has no acute complaints. He is cooperative. Heart regular no rub murmur gallop. Lungs clear no wheeze or rail. Patient is neurologically intact and appropriate. Patient is stable for transfer to behavioral health Hospital excepting Dr. Earlean Shawlobos   Phillip Sisler, MD 06/03/16 1200

## 2016-06-03 NOTE — ED Notes (Signed)
Will administer 1000 meds when pt awakens.  

## 2016-06-03 NOTE — ED Notes (Signed)
Patient was given a snack and drink, and a Regular diet was taken. 

## 2016-06-03 NOTE — Tx Team (Signed)
Initial Treatment Plan 06/03/2016 3:48 PM Phillip [name] [id number]    PATIENT STRESSORS:    PATIENT STRENGTHS: Ability for insight Active sense of humor Average or above average intelligence Capable of independent living Communication skills   PATIENT IDENTIFIED PROBLEMS: MDD  Suidcidal Ideation  bIPOLAR dISORDER          " I LOVE MY FAMILY SO MUCH" " I HATE MYSLEF FOR WHAT I DID"       DISCHARGE CRITERIA:  Ability to meet basic life and health needs Adequate post-discharge living arrangements Improved stabilization in mood, thinking, and/or behavior Medical problems require only outpatient monitoring  PRELIMINARY DISCHARGE PLAN: Attend aftercare/continuing care group Attend 12-step recovery group Outpatient therapy  PATIENT/FAMILY INVOLVEMENT: This treatment plan has been presented to and reviewed with the patient, Phillip [name], and/or family member, .  The patient and family have been given the opportunity to ask questions and make suggestions.  Phillip [name], Phillip Zeek Lynn, RN 06/03/2016, 3:48 PM

## 2016-06-04 LAB — LIPID PANEL
CHOL/HDL RATIO: 6.8 ratio
Cholesterol: 313 mg/dL — ABNORMAL HIGH (ref 0–200)
HDL: 46 mg/dL (ref >40)
LDL CALC: 207 mg/dL — AB (ref 0–99)
TRIGLYCERIDES: 299 mg/dL — AB (ref <150)
VLDL: 60 mg/dL — AB (ref 0–40)

## 2016-06-04 LAB — T4, FREE: Free T4: 0.91 ng/dL (ref 0.61–1.12)

## 2016-06-04 LAB — TSH: TSH: 4.22 u[IU]/mL (ref 0.350–4.500)

## 2016-06-04 MED ORDER — NICOTINE 21 MG/24HR TD PT24
21.0000 mg | MEDICATED_PATCH | Freq: Every day | TRANSDERMAL | Status: DC
  Administered 2016-06-04 – 2016-06-08 (×5): 21 mg via TRANSDERMAL
  Filled 2016-06-04 (×8): qty 1

## 2016-06-04 MED ORDER — CARBAMAZEPINE ER 100 MG PO TB12
100.0000 mg | ORAL_TABLET | Freq: Two times a day (BID) | ORAL | Status: DC
  Administered 2016-06-04 – 2016-06-05 (×2): 100 mg via ORAL
  Filled 2016-06-04 (×6): qty 1

## 2016-06-04 MED ORDER — CLONAZEPAM 1 MG PO TABS
1.0000 mg | ORAL_TABLET | Freq: Three times a day (TID) | ORAL | Status: DC
  Administered 2016-06-04 – 2016-06-08 (×14): 1 mg via ORAL
  Filled 2016-06-04 (×14): qty 1

## 2016-06-04 MED ORDER — QUETIAPINE FUMARATE ER 200 MG PO TB24
200.0000 mg | ORAL_TABLET | Freq: Every day | ORAL | Status: DC
  Filled 2016-06-04: qty 1

## 2016-06-04 MED ORDER — QUETIAPINE FUMARATE 100 MG PO TABS
100.0000 mg | ORAL_TABLET | Freq: Two times a day (BID) | ORAL | Status: DC
  Administered 2016-06-04 – 2016-06-05 (×2): 100 mg via ORAL
  Filled 2016-06-04 (×6): qty 1

## 2016-06-04 MED ORDER — QUETIAPINE FUMARATE 200 MG PO TABS
200.0000 mg | ORAL_TABLET | Freq: Every day | ORAL | Status: DC
  Administered 2016-06-04: 200 mg via ORAL
  Filled 2016-06-04 (×3): qty 1

## 2016-06-04 MED ORDER — QUETIAPINE FUMARATE 100 MG PO TABS
100.0000 mg | ORAL_TABLET | Freq: Two times a day (BID) | ORAL | Status: DC
  Administered 2016-06-04: 100 mg via ORAL
  Filled 2016-06-04 (×5): qty 1

## 2016-06-04 NOTE — Progress Notes (Signed)
Phillip [name] remains quite depressed, guarded and irritable. He slept late this morning, he got agitated when writer tried to convince him to take his scheduled morning meds and to speak to the  MD. He said " that dr doesn't like me.Marland Kitchen.Marland Kitchen.I  just know it". AHe completed his daily assessment and on it he wrote he denied SI today and he rated his depression, hopelessness and anxiety " 7/7/8", respectively. He became much less agitated when writer successfully explained to him the med changes MD had made and when to expect his medications today. He says " is it normal for seroquel to make me so sleepy? How about my tegretol? He told me he was going to wean me off of it...did he do that yet"? He contracts for safety with this Clinical research associatewriter. He is gamey. Superficial. Smiles unneccesarily. Glares at Phillip Electricwriter. Phillip Safety in place. Cont to be vigilent in care of pt.

## 2016-06-04 NOTE — [facility] Suicide Risk Assessment (Signed)
[facility]   Nursing information obtained from:    Demographic factors:    Current Mental Status:    Loss Factors:    Historical Factors:    Risk Reduction Factors:     Total Time spent with patient: 45 minutes Principal Problem: PTSD (post-traumatic stress disorder) Diagnosis:   Patient Active Problem List   Diagnosis Date Noted  . MDD (major depressive disorder), recurrent severe, without psychosis (HCC) [F33.2] 06/03/2016  . 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 Suicidal ideation [R45.851]   . Major depression [F32.9] 06/06/2014  . Suicidal ideations [R45.851] 06/05/2014  . Panic disorder [F41.0] 05/19/2014  . MDD (major depressive disorder), recurrent, severe, without Psychosis [F33.3] 05/18/2014  . PTSD (post-traumatic stress disorder) [F43.10] 01/29/2014  . Generalized anxiety disorder [F41.1] 06/19/2011    Class: Chronic   Subjective Data: See intake H&P for full details. Reviewed, with no updates at this time.   Continued Clinical Symptoms:  Alcohol Use Disorder Identification Test Final Score (AUDIT): 3 The "Alcohol Use Disorders Identification Test", Guidelines for Use in Primary Care, Second Edition.  World Science writerHealth Organization [facility](WHO). Score between 0-7:  no or low risk or alcohol related problems. Score between 8-15:  moderate risk of alcohol related problems. Score between 16-19:  high risk of alcohol related problems. Score 20 or above:  warrants further diagnostic evaluation for alcohol dependence and treatment.   CLINICAL FACTORS:   Bipolar Disorder:   Mixed State More than one psychiatric diagnosis Previous Psychiatric Diagnoses and Treatments   Musculoskeletal: See intake H&P for full details. Reviewed, with no updates at this time.  Psychiatric Specialty Exam: Physical Exam  ROS  Blood pressure 123/85, pulse 83, temperature 97.7 F (36.5 C), resp. rate 18, height 5\' 5"   (1.651 m), weight 59.4 kg (131 lb), SpO2 100 %.Body mass index is 21.8 kg/m.   See intake H&P for full details. Reviewed, with no updates at this time.    COGNITIVE FEATURES THAT CONTRIBUTE TO RISK:  None    SUICIDE RISK:   Severe:  Frequent, intense, and enduring suicidal ideation, specific plan, no subjective intent, but some objective markers of intent (i.e., choice of lethal method), the method is accessible, some limited preparatory behavior, evidence of impaired self-control, severe dysphoria/symptomatology, multiple risk factors present, and few if any protective factors, particularly a lack of social support.  PLAN OF CARE: See intake H&P for full details. Reviewed, with no updates at this time.   I certify that inpatient services furnished can reasonably be expected to improve the patient's condition.   Burnard LeighAlexander Arya Jhamir Pickup, MD 06/04/2016, 11:52 AM

## 2016-06-04 NOTE — H&P (Signed)
Psychiatric Admission Assessment Adult  Patient Identification: Phillip [name] MRN:  [id number] Date of Evaluation:  06/04/2016 Chief Complaint:  Major Depressive Disorder, recurrent, severe Principal Diagnosis: PTSD (post-traumatic stress disorder) Diagnosis:   Patient Active Problem List   Diagnosis Date Noted  . MDD (major depressive disorder), recurrent severe, without psychosis (HCC) [F33.2] 06/03/2016  . 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 Suicidal ideation [R45.851]   . Major depression [F32.9] 06/06/2014  . Suicidal ideations [R45.851] 06/05/2014  . Panic disorder [F41.0] 05/19/2014  . MDD (major depressive disorder), recurrent, severe, without Psychosis [F33.3] 05/18/2014  . PTSD (post-traumatic stress disorder) [F43.10] 01/29/2014  . Generalized anxiety disorder [F41.1] 06/19/2011    Class: Chronic   History of Present Illness: The patient presents as feeling generally confused and cloudy this morning. Reports that he has a significant headache, and he feels like he is having some symptoms of vertigo. He reports that his dose of clonazepam was decreased from 1 mg 3 times daily to 0.5 mg 3 times daily if needed.   He gives a fairly broken up story about how he had been initiated on clonazepam, Seroquel, and Vyvanse by a [place] psychiatrist, and this medication regimen was working perfectly for him. He reports that his girlfriend asked him to come back to [place], so he decided to do that, and he thought that his brain had been healed, so he stopped all his medications without a taper.  He reports that he recently moved back here in the summer from [place], to get back together with his girlfriend. He reports that they broke up about a month after he came back, and he had already discontinued all his psychiatric medications from [place].  As such his mood continued to decline over the ensuing months. He owns about 3  acres of land, and a cabin, so he largely just stays to himself. He reports that he eventually started dating a new woman, who he reports is a psychiatric PA, but his mood continued to decline. He reports that he has significant up-and-down periods, very angry and agitated, combined with euphoria, will be engage in excessive buying, risk-taking behaviors, and feel generally very happy and energized for a few days. This will be followed by significant decline in his mood, depression, increase in PTSD symptoms, reexperiencing.  He reports that he has significant PTSD from multiple deployments to the [place], and he has taken many lives, which she continues to grieve and suffers with a sense of moral injury from these behaviors.  He reports that he continues to want to be dead. He reports that he had placed a gun in his mouth, prior to his hospitalization, and told his girlfriend about this, which resulted in his ultimately being committed and brought to the psychiatric hospital. That he is ashamed, and knows that his mood is very bad, and that he needs help. He agrees to Pharmacist, hospitallet writer or nursing or other staff know if he is feeling unsafe with himself here in Hospital.  He reports that he is hopeful that he can get the right help, and get back on the medication regimen previously helped him.  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 He agrees with all of the diagnoses and feels that the accurately capture his experiences.  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 He [facility] and on disability for PTSD.   Is the patient at risk to self? Yes.    Has the patient been a risk to self in the past 6 months? Yes.    Has the patient been a risk to self within the distant past? Yes.    Is the patient a risk to others? Yes.    Has the patient been a risk to others in the past 6 months? Yes.    Has the patient been a risk to others within the distant past? Yes.     Prior Inpatient Therapy:   Prior Outpatient Therapy:    Alcohol Screening: 1. How often do you have a drink containing alcohol?: Monthly or less 2. How many drinks containing alcohol do you have on a typical day when you are drinking?: 3 or 4 3. How often do you have six or more drinks on one occasion?: Less than monthly Preliminary Score: 2 4. How often during the last year have you found that you were not able to stop drinking once you had started?: Never 5. How often during the last year have you failed to do what was normally expected from you becasue of drinking?: Never 6. How often during the last year have you needed a first drink in the morning to get yourself going  after a heavy drinking session?: Never 7. How often during the last year have you had a feeling of guilt of remorse after drinking?: Never 8. How often during the last year have you been unable to remember what happened the night before because you had been drinking?: Never 9. Have you or someone else been injured as a result of your drinking?: No 10. Has a relative or friend or a doctor or another health worker been concerned about your drinking or suggested you cut down?: No Alcohol Use Disorder Identification Test Final Score (AUDIT): 3 Brief Intervention: AUDIT score less than 7 or less-screening does not suggest unhealthy drinking-brief intervention not indicated Substance Abuse History in the last 12 months:  No. Consequences of Substance Abuse: Negative Previous Psychotropic Medications: Yes  Psychological Evaluations: No  Past Medical History:  Past Medical History:  Diagnosis Date  . Anxiety   . Bipolar disorder (HCC)   . Depression   . PTSD (post-traumatic stress disorder)     Past Surgical History:  Procedure Laterality Date  . cholesterol    . WISDOM TOOTH EXTRACTION     Family History:  Family History  Problem Relation Age of Onset  . Cancer Mother   . Cancer Father   . Depression Father    Family Psychiatric  History: unk  Tobacco Screening: Have you used any form of tobacco in  the last 30 days? (Cigarettes, Smokeless Tobacco, Cigars, and/or Pipes): Yes Tobacco use, Select all that apply: 4 or less cigarettes per day Are you interested in Tobacco Cessation Medications?: Yes, will notify MD for an order Counseled patient on smoking cessation including recognizing danger situations, developing coping skills and basic information about quitting provided: Yes Social History:  History  Alcohol Use No     History  Drug Use  . Types: Marijuana    Comment: seldom    Additional Social History:                           Allergies:   Allergies   Allergen Reactions  . Mellaril [Thioridazine] Swelling    Bilateral leg swelling  . Testosterone     Other reaction(s): Anaphylaxis   Lab Results:  Results for orders placed or performed during the hospital encounter of 06/03/16 (from the past 48 hour(s))  Lipid panel     Status: Abnormal   Collection Time: 06/04/16  6:15 AM  Result Value Ref Range   Cholesterol 313 (H) 0 - 200 mg/dL   Triglycerides 098 (H) <150 mg/dL   HDL 46 >11 mg/dL   Total CHOL/HDL Ratio 6.8 RATIO   VLDL 60 (H) 0 - 40 mg/dL   LDL Cholesterol 914 (H) 0 - 99 mg/dL    Comment:        Total Cholesterol/HDL:CHD Risk Coronary Heart Disease Risk Table                     Men   Women  1/2 Average Risk   3.4   3.3  Average Risk       5.0   4.4  2 X Average Risk   9.6   7.1  3 X Average Risk  23.4   11.0        Use the calculated Patient Ratio above and the CHD Risk Table to determine the patient's CHD Risk.        ATP III CLASSIFICATION (LDL):  <100     mg/dL   Optimal  782-956  mg/dL   Near or Above                    Optimal  130-159  mg/dL   Borderline  213-086  mg/dL   High  >578     mg/dL   Very High Performed at [facility], [address], [place], [place] [address]   TSH     Status: None   Collection Time: 06/04/16  6:15 AM  Result Value Ref Range   TSH 4.220 0.350 - 4.500 uIU/mL    Comment: Performed by a 3rd Generation assay with a functional sensitivity of <=0.01 uIU/mL. Performed at [facility], [address], [place], [place] [address]     Blood Alcohol level:  Lab Results  Component Value Date   [facility] <5 06/03/2016   ETH <5 05/06/2015    Metabolic Disorder Labs:  Lab Results  Component Value Date   HGBA1C 5.7 (H) 06/15/2014   MPG 117 06/15/2014   MPG 114 01/29/2014   No results found for: PROLACTIN Lab Results  Component Value Date   CHOL 313 (H) 06/04/2016   TRIG 299 (H) 06/04/2016   HDL 46 06/04/2016   CHOLHDL 6.8 06/04/2016   VLDL 60 (H)  06/04/2016   LDLCALC 207 (H) 06/04/2016   LDLCALC 132 (H) 01/29/2014  Current Medications: Current Facility-Administered Medications  Medication Dose Route Frequency Provider Last Rate Last Dose  . acetaminophen (TYLENOL) tablet 650 mg  650 mg Oral Q6H PRN Beau Fanny, FNP      . alum & mag hydroxide-simeth (MAALOX/MYLANTA) 200-200-20 MG/5ML suspension 30 mL  30 mL Oral Q4H PRN Beau Fanny, FNP      . ARIPiprazole (ABILIFY) tablet 7.5 mg  7.5 mg Oral QHS Beau Fanny, FNP   7.5 mg at 06/03/16 2202  . carbamazepine (TEGRETOL XR) 12 hr tablet 200 mg  200 mg Oral BID Beau Fanny, FNP   200 mg at 06/04/16 0744  . clonazePAM (KLONOPIN) tablet 0.5 mg  0.5 mg Oral TID PRN Beau Fanny, FNP   0.5 mg at 06/04/16 0744  . magnesium hydroxide (MILK OF MAGNESIA) suspension 30 mL  30 mL Oral Daily PRN Beau Fanny, FNP      . nicotine (NICODERM CQ - dosed in mg/24 hours) patch 21 mg  21 mg Transdermal Daily Craige Cotta, MD   21 mg at 06/04/16 0745  . pantoprazole (PROTONIX) EC tablet 20 mg  20 mg Oral Daily Craige Cotta, MD       PTA Medications: Prescriptions Prior to Admission  Medication Sig Dispense Refill Last Dose  . carbamazepine (TEGRETOL XR) 200 MG 12 hr tablet Take 200 mg by mouth 2 (two) times daily.   06/02/2016 at Unknown time  . clonazePAM (KLONOPIN) 1 MG tablet Take 1 tablet (1 mg total) by mouth 4 (four) times daily. (Patient taking differently: Take 1 mg by mouth 3 (three) times daily. ) 12 tablet 0 06/02/2016 at Unknown time  . ibuprofen (ADVIL,MOTRIN) 400 MG tablet Take 1 tablet (400 mg total) by mouth every 6 (six) hours as needed. 30 tablet 0 06/02/2016 at Unknown time  . nicotine (NICODERM CQ - DOSED IN MG/24 HOURS) 21 mg/24hr patch Place 1 patch (21 mg total) onto the skin daily. For nicotine addiction 28 patch 0 06/02/2016 at Unknown time  . pantoprazole (PROTONIX) 20 MG tablet Take 1 tablet (20 mg total) by mouth daily. 14 tablet 0 06/02/2016 at Unknown time  .  prazosin (MINIPRESS) 1 MG capsule Take 3 capsules (3 mg total) by mouth at bedtime. For nightmares 30 capsule 0 06/02/2016 at Unknown time  . testosterone cypionate (DEPOTESTOTERONE CYPIONATE) 200 MG/ML injection Inject 0.5 mLs (100 mg total) into the muscle every 14 (fourteen) days. For low testosterone replacement 10 mL 0 06/02/2016 at Unknown time  . ARIPiprazole (ABILIFY) 15 MG tablet Take 0.5 tablets (7.5 mg total) by mouth at bedtime. For Depression/mood control (Patient not taking: Reported on 09/16/2014) 30 tablet 0 Unknown at Unknown time  . carbamazepine (EQUETRO) 200 MG CP12 12 hr capsule Take 1 capsule (200 mg total) by mouth 2 (two) times daily. Mood stabilization (Patient not taking: Reported on 05/06/2015) 60 each 0 Unknown at Unknown time    Musculoskeletal: Strength & Muscle Tone: within normal limits Gait & Station: normal Patient leans: N/A  Psychiatric Specialty Exam: Physical Exam  Review of Systems  Constitutional: Positive for malaise/fatigue.  HENT:       Vertigo   Respiratory: Negative.   Cardiovascular: Negative.   Gastrointestinal: Negative.   Genitourinary: Negative.   Musculoskeletal: Negative.   Neurological: Positive for dizziness and headaches.  Psychiatric/Behavioral: Positive for depression and suicidal ideas. The patient is nervous/anxious and has insomnia.   All other systems reviewed and are negative.   Blood pressure  123/85, pulse 83, temperature 97.7 F (36.5 C), resp. rate 18, height 5\' 5"  (1.651 m), weight 59.4 kg (131 lb), SpO2 100 %.Body mass index is 21.8 kg/m.  General Appearance: Casual and Fairly Groomed  Eye Contact:  Fair  Speech:  Clear and Coherent  Volume:  Normal  Mood:  Depressed and Dysphoric  Affect:  Congruent and Tearful  Thought Process:  Coherent  Orientation:  Full (Time, Place, and Person)  Thought Content:  Logical  Suicidal Thoughts:  Yes.  without intent/plan  Homicidal Thoughts:  No  Memory:  NA  Judgement:  Poor   Insight:  Shallow  Psychomotor Activity:  Normal  Concentration:  Concentration: Fair and Attention Span: Fair  Recall:  NA  Fund of Knowledge:  Good  Language:  Good  Akathisia:  Negative  Handed:  Right  AIMS (if indicated):     Assets:  Communication Skills Desire for Improvement Financial Resources/Insurance Housing Intimacy Leisure Time Physical Health Resilience Social Support Transportation  ADL's:  Intact  Cognition:  WNL  Sleep:  Number of Hours: 5.5    Treatment Plan Summary: Everitt Wenner is a 46 year old male veteran with a complex psychiatric history, including multiple diagnoses both from childhood and adulthood. He presents with a history of childhood ADHD persisting into adulthood, a history of PTSD from military trauma, specifically a sense of moral injury from the accident violence that he is carried out towards others. He also has a diagnosis of bipolar disorder and from what he describes does seem to struggle with significant mood lability and a cyclical nature to his mood and behaviors.  He describes that he was on a medication regimen consisting of clonazepam, Seroquel 400 mg divided 3 times daily, and Vyvanse. While this is a unusual regimen of polypharmacy, he describes the medication regimen being effective in managing his mood and level of function improving. He admits that he self discontinued these medications, believing that he had been "fixed".  We'll proceed to titrate clonazepam back to 1 mg 3 times daily scheduled, and we'll restart Seroquel. Abilify has not been helpful for him in the past, so I discontinued this. He reports that Tegretol has also generally not been affective or helpful for him, so we will begin to taper Tegretol to discontinue. He does not have any history of seizures. He continues with suicidal ideation, but denies any active intent or plan to harm himself while in hospital. He has access to many firearms, and frankly states  with Clinical research associate, that he has no intention of giving up any of his firearms, as they're part of his culture and training. I suspect he would benefit from an increased level of care on discharge, including the possibility of an act team.  Observation Level/Precautions:  15 minute checks  Laboratory:  CBC Chemistry Profile UDS  Psychotherapy:  Group and individual therapy   Medications:  Initiate Seroquel 100 mg every morning, 100 mg at lunch, and 200 mg at bedtime. Clonazepam 1 mg 3 times a day. Might consider lithium for mood stabilizer and reduction of suicidal ideation   Consultations:  Psychiatry   Discharge Concerns:  Patient requires a higher level of care on discharge, and consistent psychiatric follow-up   Estimated LOS: 2 weeks   Other:     Physician Treatment Plan for Primary Diagnosis: PTSD (post-traumatic stress disorder) Long Term Goal(s): Improvement in symptoms so as ready for discharge  Short Term Goals: Ability to identify changes in lifestyle to reduce recurrence of condition  will improve, Ability to verbalize feelings will improve, Ability to disclose and discuss suicidal ideas, Ability to demonstrate self-control will improve, Ability to identify and develop effective coping behaviors will improve, Ability to maintain clinical measurements within normal limits will improve, Compliance with prescribed medications will improve and Ability to identify triggers associated with substance abuse/mental health issues will improve  Physician Treatment Plan for Secondary Diagnosis: Principal Problem:   PTSD (post-traumatic stress disorder) Active Problems:   Panic disorder   Suicidal ideation   MDD (major depressive disorder), recurrent severe, without psychosis (HCC)  Long Term Goal(s): Improvement in symptoms so as ready for discharge  Short Term Goals: Ability to identify changes in lifestyle to reduce recurrence of condition will improve, Ability to verbalize feelings will improve,  Ability to disclose and discuss suicidal ideas, Ability to demonstrate self-control will improve, Ability to identify and develop effective coping behaviors will improve, Ability to maintain clinical measurements within normal limits will improve, Compliance with prescribed medications will improve and Ability to identify triggers associated with substance abuse/mental health issues will improve  I certify that inpatient services furnished can reasonably be expected to improve the patient's condition.    Burnard Leigh, MD 3/11/201810:43 AM

## 2016-06-04 NOTE — Progress Notes (Signed)
Writer spoke with patient 1:1 at medication window he was informed of his medication to receive. He made little to no eye contact and writer was not able to engage him in conversation. He has been isolative to his room lying in bed resting. Safety maintained on unit with 15 min checks. He inquired about medication for sleep which he received a 1 time order for seroquel.

## 2016-06-04 NOTE — [facility] Suicide Risk Assessment (Signed)
[facility] INPATIENT:  Family/Significant Other Suicide Prevention Education  Suicide Prevention Education:  Patient Refusal for Family/Significant Other Suicide Prevention Education: The patient Phillip [name] has refused to provide written consent for family/significant other to be provided Family/Significant Other Suicide Prevention Education during admission and/or prior to discharge.  Physician notified.  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  Carloyn JaegerMareida J Grossman-Orr 06/04/2016, 5:21 PM

## 2016-06-04 NOTE — [facility] Group Notes (Signed)
[facility] Group Notes: (Clinical Social Work)   06/04/2016      Type of Therapy:  Group Therapy   Participation Level:  Did Not Attend despite MHT prompting   Bolton Canupp Grossman-Orr, LCSW 06/04/2016, 4:44 PM     
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
## 2016-06-05 DIAGNOSIS — R45851 Suicidal ideations: Secondary | ICD-10-CM

## 2016-06-05 DIAGNOSIS — F4310 Post-traumatic stress disorder, unspecified: Secondary | ICD-10-CM

## 2016-06-05 DIAGNOSIS — Z818 Family history of other mental and behavioral disorders: Secondary | ICD-10-CM

## 2016-06-05 DIAGNOSIS — F1290 Cannabis use, unspecified, uncomplicated: Secondary | ICD-10-CM

## 2016-06-05 DIAGNOSIS — Z79899 Other long term (current) drug therapy: Secondary | ICD-10-CM

## 2016-06-05 DIAGNOSIS — F17210 Nicotine dependence, cigarettes, uncomplicated: Secondary | ICD-10-CM

## 2016-06-05 DIAGNOSIS — F332 Major depressive disorder, recurrent severe without psychotic features: Principal | ICD-10-CM

## 2016-06-05 LAB — HEMOGLOBIN A1C
Hgb A1c MFr Bld: 5.2 % (ref 4.8–5.6)
MEAN PLASMA GLUCOSE: 103 mg/dL

## 2016-06-05 LAB — PROLACTIN: PROLACTIN: 19.5 ng/mL — AB (ref 4.0–15.2)

## 2016-06-05 MED ORDER — BOOST / RESOURCE BREEZE PO LIQD
1.0000 | Freq: Three times a day (TID) | ORAL | Status: DC
  Administered 2016-06-05: 1 via ORAL
  Filled 2016-06-05 (×15): qty 1

## 2016-06-05 MED ORDER — QUETIAPINE FUMARATE 100 MG PO TABS
100.0000 mg | ORAL_TABLET | Freq: Three times a day (TID) | ORAL | Status: DC
  Administered 2016-06-05 – 2016-06-08 (×9): 100 mg via ORAL
  Filled 2016-06-05 (×14): qty 1

## 2016-06-05 MED ORDER — QUETIAPINE FUMARATE 300 MG PO TABS
300.0000 mg | ORAL_TABLET | Freq: Every day | ORAL | Status: DC
  Administered 2016-06-05 – 2016-06-07 (×3): 300 mg via ORAL
  Filled 2016-06-05 (×5): qty 1

## 2016-06-05 NOTE — Progress Notes (Addendum)
Adult Psychoeducational Group Note  Date:  06/05/2016 Time:  10:13 PM  Group Topic/Focus:  Wrap-Up Group:   The focus of this group is to help patients review their daily goal of treatment and discuss progress on daily workbooks.  Participation Level:  Active  Participation Quality:  Appropriate  Affect:  Appropriate  Cognitive:  Alert  Insight: Appropriate  Engagement in Group:  Engaged  Modes of Intervention:  Discussion  Additional Comments:  Patient expressed that he had a good day.   Lessly Stigler L Tekeyah Santiago 06/05/2016, 10:13 PM

## 2016-06-05 NOTE — Progress Notes (Signed)
Writer spoke with patient 1:1 and he reports that he is glad that he is having medication changes but he is tired that his medications don't seem to work or he has to try different kinds in order to see what works. Writer encouraged him to try and be patient during this process and hopefully his medications will work for him. He has been observed up in the dayroom after his girlfriend left watching tv with little interaction with select peers. Support given and safety maintained on unit with 15 min checks.

## 2016-06-05 NOTE — [facility] Group Notes (Signed)
[facility] LCSW Group Therapy  06/05/2016 1:15pm  Type of Therapy:  Group Therapy vercoming Obstacles  Pt did not attend, declined invitation.   Vernie ShanksLauren Michala Deblanc, LCSW 06/05/2016 1:19 PM
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
## 2016-06-05 NOTE — Plan of Care (Signed)
Problem: Education: Goal: Utilization of techniques to improve thought processes will improve Outcome: Progressing Nurse discussed depression/coping skills/anxiety with patient.

## 2016-06-05 NOTE — Progress Notes (Signed)
Recreation Therapy Notes  Date: 06/05/16 Time: 0930 Location: [address] Dayroom  Group Topic: Stress Management  Goal Area(s) Addresses:  Patient will verbalize importance of using healthy stress management.  Patient will identify positive emotions associated with healthy stress management.   Intervention: Stress Management  Activity :  Letting Go Meditation.  LRT introduced the stress management technique of meditation.  LRT played a meditation from the Calm app focused on letting go.  Patients were to follow along as meditation played to engage in the technique.  Education:  Stress Management, Discharge Planning.   Education Outcome: Acknowledges edcuation/In group clarification offered/Needs additional education  Clinical Observations/Feedback: Pt did not attend group.   Lauralye Kinn, LRT/CTRS         Jevaeh Shams A 06/05/2016 12:41 PM 

## 2016-06-05 NOTE — Progress Notes (Signed)
Pt attend group. Hi day was a 7. His goal was to get medication clear up for him.

## 2016-06-05 NOTE — Tx Team (Signed)
Interdisciplinary Treatment and Diagnostic Plan Update  06/05/2016 Time of Session: 9:30am Phillip [name] MRN: [id number]  Principal Diagnosis: PTSD (post-traumatic stress disorder)  Secondary Diagnoses: Principal Problem:   PTSD (post-traumatic stress disorder) Active Problems:   Panic disorder   Suicidal ideation   MDD (major depressive disorder), recurrent severe, without psychosis ([place])   Current Medications:  Current Facility-Administered Medications  Medication Dose Route Frequency Provider Last Rate Last Dose  . acetaminophen (TYLENOL) tablet 650 mg  650 mg Oral Q6H PRN Benjamine Mola, FNP   650 mg at 06/04/16 1203  . alum & mag hydroxide-simeth (MAALOX/MYLANTA) 200-200-20 MG/5ML suspension 30 mL  30 mL Oral Q4H PRN Benjamine Mola, FNP   30 mL at 06/05/16 0826  . carbamazepine (TEGRETOL XR) 12 hr tablet 100 mg  100 mg Oral BID Aundra Dubin, MD   100 mg at 06/05/16 0827  . clonazePAM (KLONOPIN) tablet 1 mg  1 mg Oral TID Aundra Dubin, MD   1 mg at 06/05/16 0827  . magnesium hydroxide (MILK OF MAGNESIA) suspension 30 mL  30 mL Oral Daily PRN Benjamine Mola, FNP      . nicotine (NICODERM CQ - dosed in mg/24 hours) patch 21 mg  21 mg Transdermal Daily Jenne Campus, MD   21 mg at 06/05/16 0828  . pantoprazole (PROTONIX) EC tablet 20 mg  20 mg Oral Daily Jenne Campus, MD   20 mg at 06/05/16 0827  . QUEtiapine (SEROQUEL) tablet 100 mg  100 mg Oral BID Jenne Campus, MD   100 mg at 06/05/16 0827  . QUEtiapine (SEROQUEL) tablet 200 mg  200 mg Oral QHS Aundra Dubin, MD   200 mg at 06/04/16 2101    PTA Medications: Prescriptions Prior to Admission  Medication Sig Dispense Refill Last Dose  . carbamazepine (TEGRETOL XR) 200 MG 12 hr tablet Take 200 mg by mouth 2 (two) times daily.   06/02/2016 at Unknown time  . clonazePAM (KLONOPIN) 1 MG tablet Take 1 tablet (1 mg total) by mouth 4 (four) times daily. (Patient taking differently: Take 1 mg by mouth  3 (three) times daily. ) 12 tablet 0 06/02/2016 at Unknown time  . ibuprofen (ADVIL,MOTRIN) 400 MG tablet Take 1 tablet (400 mg total) by mouth every 6 (six) hours as needed. 30 tablet 0 06/02/2016 at Unknown time  . nicotine (NICODERM CQ - DOSED IN MG/24 HOURS) 21 mg/24hr patch Place 1 patch (21 mg total) onto the skin daily. For nicotine addiction 28 patch 0 06/02/2016 at Unknown time  . pantoprazole (PROTONIX) 20 MG tablet Take 1 tablet (20 mg total) by mouth daily. 14 tablet 0 06/02/2016 at Unknown time  . prazosin (MINIPRESS) 1 MG capsule Take 3 capsules (3 mg total) by mouth at bedtime. For nightmares 30 capsule 0 06/02/2016 at Unknown time  . testosterone cypionate (DEPOTESTOTERONE CYPIONATE) 200 MG/ML injection Inject 0.5 mLs (100 mg total) into the muscle every 14 (fourteen) days. For low testosterone replacement 10 mL 0 06/02/2016 at Unknown time  . ARIPiprazole (ABILIFY) 15 MG tablet Take 0.5 tablets (7.5 mg total) by mouth at bedtime. For Depression/mood control (Patient not taking: Reported on 09/16/2014) 30 tablet 0 Unknown at Unknown time  . carbamazepine (EQUETRO) 200 MG CP12 12 hr capsule Take 1 capsule (200 mg total) by mouth 2 (two) times daily. Mood stabilization (Patient not taking: Reported on 05/06/2015) 60 each 0 Unknown at Unknown time    Treatment Modalities: Medication Management,  Group therapy, Case management,  1 to 1 session with clinician, Psychoeducation, Recreational therapy.  Patient Stressors:    Patient Strengths: Ability for insight Active sense of humor Average or above average intelligence Capable of independent living Communication skills  Physician Treatment Plan for Primary Diagnosis: PTSD (post-traumatic stress disorder) Long Term Goal(s): Improvement in symptoms so as ready for discharge  Short Term Goals: Ability to identify changes in lifestyle to reduce recurrence of condition will improve Ability to verbalize feelings will improve Ability to disclose and  discuss suicidal ideas Ability to demonstrate self-control will improve Ability to identify and develop effective coping behaviors will improve Ability to maintain clinical measurements within normal limits will improve Compliance with prescribed medications will improve Ability to identify triggers associated with substance abuse/mental health issues will improve Ability to identify changes in lifestyle to reduce recurrence of condition will improve Ability to verbalize feelings will improve Ability to disclose and discuss suicidal ideas Ability to demonstrate self-control will improve Ability to identify and develop effective coping behaviors will improve Ability to maintain clinical measurements within normal limits will improve Compliance with prescribed medications will improve Ability to identify triggers associated with substance abuse/mental health issues will improve  Medication Management: Evaluate patient's response, side effects, and tolerance of medication regimen.  Therapeutic Interventions: 1 to 1 sessions, Unit Group sessions and Medication administration.  Evaluation of Outcomes: Not Met  Physician Treatment Plan for Secondary Diagnosis: Principal Problem:   PTSD (post-traumatic stress disorder) Active Problems:   Panic disorder   Suicidal ideation   MDD (major depressive disorder), recurrent severe, without psychosis ([place])   Long Term Goal(s): Improvement in symptoms so as ready for discharge  Short Term Goals: Ability to identify changes in lifestyle to reduce recurrence of condition will improve Ability to verbalize feelings will improve Ability to disclose and discuss suicidal ideas Ability to demonstrate self-control will improve Ability to identify and develop effective coping behaviors will improve Ability to maintain clinical measurements within normal limits will improve Compliance with prescribed medications will improve Ability to identify triggers  associated with substance abuse/mental health issues will improve Ability to identify changes in lifestyle to reduce recurrence of condition will improve Ability to verbalize feelings will improve Ability to disclose and discuss suicidal ideas Ability to demonstrate self-control will improve Ability to identify and develop effective coping behaviors will improve Ability to maintain clinical measurements within normal limits will improve Compliance with prescribed medications will improve Ability to identify triggers associated with substance abuse/mental health issues will improve  Medication Management: Evaluate patient's response, side effects, and tolerance of medication regimen.  Therapeutic Interventions: 1 to 1 sessions, Unit Group sessions and Medication administration.  Evaluation of Outcomes: Not Met   RN Treatment Plan for Primary Diagnosis: PTSD (post-traumatic stress disorder) Long Term Goal(s): Knowledge of disease and therapeutic regimen to maintain health will improve  Short Term Goals: Ability to verbalize feelings will improve, Ability to disclose and discuss suicidal ideas and Ability to identify and develop effective coping behaviors will improve  Medication Management: RN will administer medications as ordered by provider, will assess and evaluate patient's response and provide education to patient for prescribed medication. RN will report any adverse and/or side effects to prescribing provider.  Therapeutic Interventions: 1 on 1 counseling sessions, Psychoeducation, Medication administration, Evaluate responses to treatment, Monitor vital signs and CBGs as ordered, Perform/monitor CIWA, COWS, AIMS and Fall Risk screenings as ordered, Perform wound care treatments as ordered.  Evaluation of Outcomes: Not Met  LCSW Treatment Plan for Primary Diagnosis: PTSD (post-traumatic stress disorder) Long Term Goal(s): Safe transition to appropriate next level of care at  discharge, Engage patient in therapeutic group addressing interpersonal concerns.  Short Term Goals: Engage patient in aftercare planning with referrals and resources, Identify triggers associated with mental health/substance abuse issues and Increase skills for wellness and recovery  Therapeutic Interventions: Assess for all discharge needs, 1 to 1 time with Social worker, Explore available resources and support systems, Assess for adequacy in community support network, Educate family and significant other(s) on suicide prevention, Complete Psychosocial Assessment, Interpersonal group therapy.  Evaluation of Outcomes: Not Met   Progress in Treatment: Attending groups: Pt is new to milieu, continuing to assess  Participating in groups: Pt is new to milieu, continuing to assess  Taking medication as prescribed: Yes, MD continues to assess for medication changes as needed Toleration medication: Yes, no side effects reported at this time Family/Significant other contact made: No, Pt declines Patient understands diagnosis: Continuing to assess Discussing patient identified problems/goals with staff: Yes Medical problems stabilized or resolved: Yes Denies suicidal/homicidal ideation: Yes Issues/concerns per patient self-inventory: None Other: N/A  New problem(s) identified: None identified at this time.   New Short Term/Long Term Goal(s): None identified at this time.   Discharge Plan or Barriers: CSW will assess for appropriate discharge plan and relevant barriers.   Reason for Continuation of Hospitalization: Anxiety Depression Medication stabilization Suicidal ideation  Estimated Length of Stay: 2-4 days  Attendees: Patient: 06/05/2016  9:12 AM  Physician: Dr. Modesta Messing 06/05/2016  9:12 AM  Nursing: Grayland Ormond, Eulogio Bear, RN 06/05/2016  9:12 AM  RN Care Manager: Lars Pinks, RN 06/05/2016  9:12 AM  Social Worker: Adriana Reams, LCSW; Matthew Saras, [place] 06/05/2016  9:12 AM   Recreational Therapist:  06/05/2016  9:12 AM  Other: Lindell Spar, NP; Samuel Jester, NP 06/05/2016  9:12 AM  Other:  06/05/2016  9:12 AM  Other: 06/05/2016  9:12 AM    Scribe for Treatment Team: Gladstone Lighter, LCSW 06/05/2016 9:12 AM

## 2016-06-05 NOTE — Progress Notes (Signed)
D:  Patient's self inventory sheet, patient has fair sleep, no sleep medication given.  Fair appetite, normal energy level, poor concentration.  Rated depression 7, hopeless 5, anxiety 8.  Denied withdrawals.  Denied SIi.  Physical problems, headaches.  Denied physical pain.  Goal is to feel better.  Plans to talk to staff.  Does have discharge plans. A:  Medications administered per MD orders.  Emotional support and encouragement given patient. R:  Patient denied SI and HI, contracts for safety.   Denied A/V hallucinations.  Safety maintained with 15 minute checks.

## 2016-06-06 MED ORDER — MIRTAZAPINE 7.5 MG PO TABS
7.5000 mg | ORAL_TABLET | Freq: Every day | ORAL | Status: DC
  Administered 2016-06-06 – 2016-06-07 (×2): 7.5 mg via ORAL
  Filled 2016-06-06 (×4): qty 1
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
## 2016-06-06 NOTE — Progress Notes (Signed)
D:  Patient's self inventory sheet, patient has poor sleep, no sleep medication.  Fair appetite, low energy level, poor concentration.  Rated depression 5, hopeless 4, anxiety 8.  Denied withdrawals.  Denied SI.  Denied physical problems. Denied pain.  Goal is to figure his life out.  Plans to think.  Does have discharge plans. A:  Medications administered per MD orders.  Emotional support and encouragement given patient. R:  Denied HI while talking to nurse.  Denied A/V hallucinations.  Safety maintained with 15 minute checks. Patient talked to nurse this morning.  "I've done so many bad things.  God is never going to forgive me.  I get $4,000 a month, have a beautiful girlfriend, home, car, no bills.  Everything is telling me to hurry up and get it over with.  [place] [facility] [place], reminds me of things I've done, what others have told me to do.  God will never forgive me.  I want to go to heaven but God will never forgive me.  If I leave, I know what is going to happen." Gave patient Remeron printout.   Patient stated he is feeling better late this afternoon. NP and staff have talked to patient, giving emotional support and encouragement.

## 2016-06-06 NOTE — Plan of Care (Signed)
Problem: Education: Goal: Knowledge of the prescribed therapeutic regimen will improve Outcome: Progressing Nurse discussed depression/anxiety/coping skills with patient.    

## 2016-06-06 NOTE — Progress Notes (Signed)
[name], male   DOB: 06/21/1970, 46 y.o.   MRN: [id number]  D: Patient in room on approach. Pt mood and affect appeared depressed and flat. Pt reports tolerating medication but reports not sleeping well. Pt reports anxiety over family and friends not understanding his disease and expect him to get over it. Pt reports feeling enbarressed about going home because of what people will say about him. Pt attended and participated in evening wrap up group. Cooperative with assessment. No acute distressed noted at this time.   A: Met with pt 1:1. Medications administered as prescribed. Support and encouragement provided to attend groups and engage in milieu. Pt encouraged to discuss feelings and come to staff with any question or concerns.   R: Patient remains safe on the unit.

## 2016-06-06 NOTE — [facility] Group Notes (Signed)
The focus of this group is to educate the patient on the purpose and policies of crisis stabilization and provide a format to answer questions about their admission.  The group details unit policies and expectations of patients while admitted.  Patient did not attend 0900 nurse education orientation group this morning.  Patient stayed in bed.   

## 2016-06-06 NOTE — [facility] Group Notes (Signed)
[facility] LCSW Group Therapy 06/06/2016 1:15 PM  Type of Therapy: Group Therapy- Feelings about Diagnosis  Pt did not attend, declined invitation.   Vernie ShanksLauren Katriel Cutsforth, LCSW 06/06/2016 3:19 PM

## 2016-06-06 NOTE — Progress Notes (Signed)
Patient isolated to his room. He appeared very sad and depressed. He denied SI/HI and denied Hallucination. He reported that his medication working and that he is feeling better. Writer encouraged and supported patients. Q 15 minute checks continues for safety.

## 2016-06-06 NOTE — Progress Notes (Signed)
Adult Psychoeducational Group Note  Date:  06/06/2016 Time:  9:53 PM  Group Topic/Focus:  Wrap-Up Group:   The focus of this group is to help patients review their daily goal of treatment and discuss progress on daily workbooks.  Participation Level:  Did Not Attend  Participation Quality:  Did not attend  Affect:  Did not attend  Cognitive:  Did not attend  Insight: None  Engagement in Group:  Did not attend  Modes of Intervention:  Did not attend  Additional Comments:  Patient did not attend wrap up group this evening.   Devaughn Savant L Mackenna Kamer 06/06/2016, 9:53 PM

## 2016-06-07 NOTE — [facility] Group Notes (Signed)
[facility] LCSW Group Therapy 06/07/2016 1:15 PM  Type of Therapy: Group Therapy- Emotion Regulation  Pt did not attend, declined invitation.   Vernie ShanksLauren Lenzy Kerschner, LCSW 06/07/2016 5:21 PM

## 2016-06-07 NOTE — Progress Notes (Signed)
CSW met with Pt along with MD. Pt reports access to guns but declines consent to speak with anyone and denies any intent to use them; however he reports that he thinks about using them "all the time." MD aware. CSW to consult security to see if any other necessary measures should be taken.   Adriana Reams, LCSW Clinical Social Work [phone]

## 2016-06-07 NOTE — Progress Notes (Signed)
[facility] MD Progress Note  06/07/2016 12:44 PM Phillip [name]  MRN:  [id number]  Subjective: Pt reports " I feel better. I feel like the current medications are effective more than the previous ones.I am trying to work on find more balance."   Objective: Patient seen and chart reviewed.Discussed patient with treatment team.  Pt today seen as more calm, not too preoccupied with negative thoughts , states his anxiety is under better control than before. Pt reports he has been having some intrusive thoughts , but he is able to cope better. Pt wants to have a therapist who can work with him on a more intensive basis and see him more often. CSW was also present during the evaluation session, discussed IOP versus outpatient counselor . Will work on the same prior to DC .   Principal Problem: PTSD (post-traumatic stress disorder)  Diagnosis:   Patient Active Problem List   Diagnosis Date Noted  . MDD (major depressive disorder), recurrent severe, without psychosis (HCC) [F33.2] 06/03/2016  . 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 Suicidal ideation [R45.851]   . Major depression [F32.9] 06/06/2014  . Suicidal ideations [R45.851] 06/05/2014  . Panic disorder [F41.0] 05/19/2014  . MDD (major depressive disorder), recurrent, severe, without Psychosis [F33.3] 05/18/2014  . PTSD (post-traumatic stress disorder) [F43.10] 01/29/2014  . Generalized anxiety disorder [F41.1] 06/19/2011    Class: Chronic   Total Time spent with patient: 15 minutes  Past Psychiatric History: See HPI  Past Medical History:  Past Medical History:  Diagnosis Date  . Anxiety   . Bipolar disorder (HCC)   . Depression   . PTSD (post-traumatic stress disorder)     Past Surgical History:  Procedure Laterality Date  . cholesterol    . WISDOM TOOTH EXTRACTION     Family History:  Family History  Problem Relation Age of Onset  . Cancer Mother   . Cancer Father    . Depression Father    Family Psychiatric  History: See HPI  Social History:  History  Alcohol Use No     History  Drug Use  . Types: Marijuana    Comment: seldom    Social History   Social History  . Marital status: Divorced    Spouse name: N/A  . Number of children: N/A  . Years of education: N/A   Social History Main Topics  . Smoking status: Current Every Day Smoker    Packs/day: 0.00    Years: 19.00  . Smokeless tobacco: Never Used  . Alcohol use No  . Drug use: Yes    Types: Marijuana     Comment: seldom  . Sexual activity: Not Asked   Other Topics Concern  . None   Social History Narrative  . None   Additional Social History:   see HPI  Sleep: IMPROVING  Appetite:  Fair  Current Medications: Current Facility-Administered Medications  Medication Dose Route Frequency Provider Last Rate Last Dose  . acetaminophen (TYLENOL) tablet 650 mg  650 mg Oral Q6H PRN Beau Fanny, FNP   650 mg at 06/04/16 1203  . alum & mag hydroxide-simeth (MAALOX/MYLANTA) 200-200-20 MG/5ML suspension 30 mL  30 mL Oral Q4H PRN Beau Fanny, FNP   30 mL at 06/07/16 1141  . clonazePAM (KLONOPIN) tablet 1 mg  1 mg Oral TID Burnard Leigh, MD   1 mg at 06/07/16 1141  . feeding supplement (BOOST / RESOURCE BREEZE) liquid 1 Container  1 Container Oral TID BM Sanjuana KavaAgnes I Nwoko, NP   1 Container at 06/05/16 1515  . magnesium hydroxide (MILK OF MAGNESIA) suspension 30 mL  30 mL Oral Daily PRN Beau FannyJohn C Withrow, FNP      . mirtazapine (REMERON) tablet 7.5 mg  7.5 mg Oral QHS Sanjuana KavaAgnes I Nwoko, NP   7.5 mg at 06/06/16 2130  . nicotine (NICODERM CQ - dosed in mg/24 hours) patch 21 mg  21 mg Transdermal Daily Craige CottaFernando A Cobos, MD   21 mg at 06/07/16 0818  . pantoprazole (PROTONIX) EC tablet 20 mg  20 mg Oral Daily Craige CottaFernando A Cobos, MD   20 mg at 06/07/16 0818  . QUEtiapine (SEROQUEL) tablet 100 mg  100 mg Oral TID Neysa Hottereina Hisada, MD   100 mg at 06/07/16 1141  . QUEtiapine (SEROQUEL) tablet 300  mg  300 mg Oral QHS Neysa Hottereina Hisada, MD   300 mg at 06/06/16 2130   Lab Results:  No results found for this or any previous visit (from the past 48 hour(s)).  Blood Alcohol level:  Lab Results  Component Value Date   ETH <5 06/03/2016   ETH <5 05/06/2015    Metabolic Disorder Labs: Lab Results  Component Value Date   HGBA1C 5.2 06/04/2016   MPG 103 06/04/2016   MPG 117 06/15/2014   Lab Results  Component Value Date   PROLACTIN 19.5 (H) 06/04/2016   Lab Results  Component Value Date   CHOL 313 (H) 06/04/2016   TRIG 299 (H) 06/04/2016   HDL 46 06/04/2016   CHOLHDL 6.8 06/04/2016   VLDL 60 (H) 06/04/2016   LDLCALC 207 (H) 06/04/2016   LDLCALC 132 (H) 01/29/2014   Physical Findings: AIMS: Facial and Oral Movements Muscles of Facial Expression: None, normal Lips and Perioral Area: None, normal Jaw: None, normal Tongue: None, normal,Extremity Movements Upper (arms, wrists, hands, fingers): None, normal Lower (legs, knees, ankles, toes): None, normal, Trunk Movements Neck, shoulders, hips: None, normal, Overall Severity Severity of abnormal movements (highest score from questions above): None, normal Incapacitation due to abnormal movements: None, normal Patient's awareness of abnormal movements (rate only patient's report): No Awareness, Dental Status Current problems with teeth and/or dentures?: No Does patient usually wear dentures?: No  CIWA:  CIWA-Ar Total: 1 COWS:  COWS Total Score: 2  Musculoskeletal: Strength & Muscle Tone: within normal limits Gait & Station: normal Patient leans: N/A  Psychiatric Specialty Exam: Physical Exam  Nursing note and vitals reviewed.   Review of Systems  Psychiatric/Behavioral: Positive for depression. The patient is nervous/anxious.   All other systems reviewed and are negative.   Blood pressure (!) 130/104, pulse 88, temperature 97.6 F (36.4 C), temperature source Oral, resp. rate 16, height 5\' 5"  (1.651 m), weight 59.4 kg  (131 lb), SpO2 100 %.Body mass index is 21.8 kg/m.  General Appearance: Casual  Eye Contact:  Fair  Speech:  Clear and Coherent  Volume:  Normal  Mood:  Anxious and Depressed  Affect:  Congruent  Thought Process:  Goal Directed  Orientation:  Full (Time, Place, and Person)  Thought Content:  Logical  Suicidal Thoughts:  No  Homicidal Thoughts:  No  [name]:  Immediate;   Fair Recent;   Fair Remote;   Fair  Judgement:  Fair  Insight:  Fair  Psychomotor Activity:  Normal  Concentration:  Concentration: Good and Attention Span: Fair  Recall:  Fair  Fund of Knowledge:  Good  Language:  Good  Akathisia:  No  Handed:  Right  AIMS (if indicated):     Assets:  Communication Skills Desire for Improvement  ADL's:  Intact  Cognition:  WNL  Sleep:  Number of Hours: 6.25   PTSD (post-traumatic stress disorder) IMPROVING  Will continue today 06/07/16  plan as below except where it is noted.       Assessment: Patient continues to have some anxiety, sadness, however reports today that he is overall making progress, wants to get in to an IOP/CBT , CSW to assist with the same. Will continue treatment.   Continue Seroquel 100 mg po tid and 300 mg po qhs for mood sx. Continue Klonopin 1 mg po tid for anxiety sx. Continue Remeron 7.5 mg po qhs for insomnia. CSW to work on after care/IOP/CBT . Will continue treatment.  Jomarie Longs, MD 06/07/2016, 12:44 PMPatient ID: Phillip [name], male   DOB: February 05, 1971, 46 y.o.   MRN: [id number]

## 2016-06-07 NOTE — Progress Notes (Signed)
Adult Psychoeducational Group Note  Date:  06/07/2016 Time:  10:05 PM  Group Topic/Focus:  Wrap-Up Group:   The focus of this group is to help patients review their daily goal of treatment and discuss progress on daily workbooks.  Participation Level:  Did Not Attend  Participation Quality:  Did not attend  Affect:  Did not attend  Cognitive:  Did not attend  Insight: None  Engagement in Group:  Did not attend  Modes of Intervention:  Did not attend  Additional Comments:  Patient did not attend wrap up group this evening.   Gilliam Hawkes L Neelam Tiggs 06/07/2016, 10:05 PM

## 2016-06-07 NOTE — Progress Notes (Signed)
Nursing Progress Note: 7p-7a D: Pt currently presents with a depressed/anxious/pleasant affect and behavior. Interacting minimally with milieu. Pt reports good sleep with current medication regimen.   A: Pt provided with medications per providers orders. Pt's labs and vitals were monitored throughout the night. Pt supported emotionally and encouraged to express concerns and questions. Pt educated on medications.  R: Pt's safety ensured with 15 minute and environmental checks. Pt currently denies SI/HI/Self Harm and AVH. Pt verbally contracts to seek staff if SI/HI or A/VH occurs and to consult with staff before acting on any harmful thoughts. Will continue to monitor.

## 2016-06-07 NOTE — Plan of Care (Signed)
Problem: Education: Goal: Verbalization of understanding the information provided will improve Outcome: Progressing Patient verbalizes understanding of information, education provided.  Problem: Safety: Goal: Periods of time without injury will increase Outcome: Progressing Patient has not engaged in self harm, denies SI at present.   

## 2016-06-07 NOTE — Progress Notes (Signed)
D: Patient up and visible in the milieu. Spoke with patient 1:1. Rates sleep as good, appetite as good, energy as normal and concentration as good. Patient's affect appropriate with congruent mood. Some anxiety noted. Rating depression at a 2/10, hopelessness at a 2/10 and anxiety at a 4/10. States goal for today is to plan discharge. Denies pain however complaining of heartburn.   A: Medicated per orders, maalox prn given. Emotional support offered and self inventory reviewed. Encouraged completion of Suicide Safety Plan. Discussed POC with MD, SW.    R: Patient verbalizes understanding of POC. Patient denies SI/HI and remains safe on level III obs.

## 2016-06-07 NOTE — Progress Notes (Signed)
Recreation Therapy Notes  Date: 06/07/16 Time: 0930 Location: 300 [address] Dayroom  Group Topic: Stress Management  Goal Area(s) Addresses:  Patient will verbalize importance of using healthy stress management.  Patient will identify positive emotions associated with healthy stress management.   Intervention: Stress Management  Activity :  Guided Imagery.  LRT introduced the stress management technique of guided imagery.  LRT read a script to allow patients to go one a mental vacation.  Patients were to follow along as LRT read script to engage in the activity.  Education:  Stress Management, Discharge Planning.   Education Outcome: Acknowledges edcuation/In group clarification offered/Needs additional education  Clinical Observations/Feedback: Pt did not attend group.   Phillip [name], Phillip [name]         Lillia AbedLindsay, Wania Longstreth A 06/07/2016 1:08 PM

## 2016-06-07 NOTE — Plan of Care (Signed)
Problem: Medication: Goal: Compliance with prescribed medication regimen will improve Outcome: Progressing Pt compliant with medication regime   

## 2016-06-08 MED ORDER — MIRTAZAPINE 7.5 MG PO TABS: 7.5000 mg | ORAL_TABLET | Freq: Every day | ORAL | 0 refills | Status: DC

## 2016-06-08 MED ORDER — CLONAZEPAM 1 MG PO TABS: 1.0000 mg | ORAL_TABLET | Freq: Three times a day (TID) | ORAL | 0 refills | Status: DC

## 2016-06-08 MED ORDER — NICOTINE 21 MG/24HR TD PT24: 21.0000 mg | MEDICATED_PATCH | Freq: Every day | TRANSDERMAL | 0 refills | Status: DC

## 2016-06-08 MED ORDER — QUETIAPINE FUMARATE 300 MG PO TABS: 300.0000 mg | ORAL_TABLET | Freq: Every day | ORAL | 0 refills | Status: DC

## 2016-06-08 MED ORDER — PANTOPRAZOLE SODIUM 20 MG PO TBEC: 20.0000 mg | DELAYED_RELEASE_TABLET | Freq: Every day | ORAL | 0 refills | Status: DC

## 2016-06-08 MED ORDER — ZOLPIDEM TARTRATE 5 MG PO TABS: 5.0000 mg | ORAL_TABLET | Freq: Every evening | ORAL | Status: DC | PRN

## 2016-06-08 MED ORDER — QUETIAPINE FUMARATE 100 MG PO TABS: 100.0000 mg | ORAL_TABLET | Freq: Three times a day (TID) | ORAL | 0 refills | Status: DC

## 2016-06-08 NOTE — Progress Notes (Signed)
Written/verbal discharge instructions, AVS, Transition Report, SRA, prescriptions and follow-up appointments given to patient with verbalization of understanding;  Patient denies suicidal and homicidal ideation. Patient calling for transport home.

## 2016-06-08 NOTE — [facility] Suicide Risk Assessment (Signed)
[facility]   Principal Problem: PTSD (post-traumatic stress disorder) Discharge Diagnoses:  Patient Active Problem List   Diagnosis Date Noted  . MDD (major depressive disorder), recurrent severe, without psychosis (HCC) [F33.2] 06/03/2016  . 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 Suicidal ideation [R45.851]   . Major depression [F32.9] 06/06/2014  . Suicidal ideations [R45.851] 06/05/2014  . Panic disorder [F41.0] 05/19/2014  . MDD (major depressive disorder), recurrent, severe, without Psychosis [F33.3] 05/18/2014  . PTSD (post-traumatic stress disorder) [F43.10] 01/29/2014  . Generalized anxiety disorder [F41.1] 06/19/2011    Class: Chronic    Total Time spent with patient: 30 minutes  Musculoskeletal: Strength & Muscle Tone: within normal limits Gait & Station: normal Patient leans: N/A  Psychiatric Specialty Exam: Review of Systems  Psychiatric/Behavioral: Negative for depression, hallucinations, substance abuse and suicidal ideas. The patient does not have insomnia.   All other systems reviewed and are negative.   Blood pressure 130/85, pulse 97, temperature 97.5 F (36.4 C), temperature source Oral, resp. rate 18, height 5\' 5"  (1.651 m), weight 59.4 kg (131 lb), SpO2 100 %.Body mass index is 21.8 kg/m.  General Appearance: Casual  Eye Contact::  Fair  Speech:  Clear and Coherent409  Volume:  Normal  Mood:  Euthymic  Affect:  Appropriate  Thought Process:  Goal Directed and Descriptions of Associations: Intact  Orientation:  Full (Time, Place, and Person)  Thought Content:  Logical  Suicidal Thoughts:  No  Homicidal Thoughts:  No  Memory:  Immediate;   Fair Recent;   Fair Remote;   Fair  Judgement:  Fair  Insight:  Fair  Psychomotor Activity:  Normal  Concentration:  Fair  Recall:  Fis[employer]Fair  Fund of Knowledge:Fair  Language: Fair  Akathisia:  No  Handed:  Right  AIMS (if  indicated):     Assets:  Desire for Improvement  Sleep:  Number of Hours: 6  Cognition: WNL  ADL's:  Intact   Mental Status Per Nursing Assessment::   On Admission:     Demographic Factors:  Male and Caucasian  Loss Factors: NA  Historical Factors: Impulsivity  Risk Reduction Factors:   Positive therapeutic relationship  Continued Clinical Symptoms:  Previous Psychiatric Diagnoses and Treatments  Cognitive Features That Contribute To Risk:  None    Suicide Risk:  Minimal: No identifiable suicidal ideation.  Patients presenting with no risk factors but with morbid ruminations; may be classified as minimal risk based on the severity of the depressive symptoms  Follow-up Information    [facility] Follow up on 07/06/2016.   Specialty:  Behavioral Health Why:  at 9am with Dr. Rene KocherEksir for medication management.  Contact information: [address] [place] [id number] [phone]       [facility] Follow up.   Specialty:  Behavioral Health Why:  [facility].  Contact information: [address] [place] [address] [place] [address] [phone]           Plan Of Care/Follow-up recommendations:  Activity:  no restrictions Diet:  regular Tests:  as needed Other:  follow up with aftercare  Analycia Khokhar, MD 06/08/2016, 9:58 AM

## 2016-06-08 NOTE — Progress Notes (Signed)
  [facility] :  Will you be returning to the same living situation after discharge:  Yes,  Pt returning home At discharge, do you have transportation home?: Yes,  Pt to arrange his own transportation Do you have the ability to pay for your medications: Yes,  Pt provided with prescriptions  Release of information consent forms completed and in the chart;  Patient's signature needed at discharge.  Patient to Follow up at: Follow-up Information    [facility] Follow up on 07/06/2016.   Specialty:  Behavioral Health Why:  at 9am with Dr. Rene KocherEksir for medication management.  Contact information: [address] [place] [id number] [phone]       [facility] Follow up on 06/13/2016.   Specialty:  Behavioral Health Why:  at 2:00 with Aleima for therapy. [facility] therapist [facility] you [facility] are seen.  Contact information: [address] [place] [address] [place] [address] [phone]           Next level of care provider has [facility] Link:no  Safety Planning and Suicide Prevention discussed: Yes,  with Pt; declines consent to speak with family or friends  Have you used any form of tobacco in the last 30 days? (Cigarettes, Smokeless Tobacco, Cigars, and/or Pipes): Yes  Has patient been referred to the Quitline?: Patient refused referral  Patient has been referred for addiction treatment: Yes  Verdene LennertLauren C Honestee Revard 06/08/2016, 10:09 AM

## 2016-06-08 NOTE — Tx Team (Signed)
Interdisciplinary Treatment and Diagnostic Plan Update  06/08/2016 Time of Session: 9:30am Phillip [name] MRN: [id number]  Principal Diagnosis: PTSD (post-traumatic stress disorder)  Secondary Diagnoses: Principal Problem:   PTSD (post-traumatic stress disorder) Active Problems:   Panic disorder   Suicidal ideation   MDD (major depressive disorder), recurrent severe, without psychosis (HCC)   Current Medications:  Current Facility-Administered Medications  Medication Dose Route Frequency Provider Last Rate Last Dose  . acetaminophen (TYLENOL) tablet 650 mg  650 mg Oral Q6H PRN Beau Fanny, FNP   650 mg at 06/04/16 1203  . alum & mag hydroxide-simeth (MAALOX/MYLANTA) 200-200-20 MG/5ML suspension 30 mL  30 mL Oral Q4H PRN Beau Fanny, FNP   30 mL at 06/08/16 4098  . clonazePAM (KLONOPIN) tablet 1 mg  1 mg Oral TID Burnard Leigh, MD   1 mg at 06/08/16 0810  . feeding supplement (BOOST / RESOURCE BREEZE) liquid 1 Container  1 Container Oral TID BM Sanjuana Kava, NP   1 Container at 06/05/16 1515  . magnesium hydroxide (MILK OF MAGNESIA) suspension 30 mL  30 mL Oral Daily PRN Beau Fanny, FNP      . mirtazapine (REMERON) tablet 7.5 mg  7.5 mg Oral QHS Sanjuana Kava, NP   7.5 mg at 06/07/16 2101  . nicotine (NICODERM CQ - dosed in mg/24 hours) patch 21 mg  21 mg Transdermal Daily Craige Cotta, MD   21 mg at 06/08/16 0810  . pantoprazole (PROTONIX) EC tablet 20 mg  20 mg Oral Daily Craige Cotta, MD   20 mg at 06/08/16 0810  . QUEtiapine (SEROQUEL) tablet 100 mg  100 mg Oral TID Neysa Hotter, MD   100 mg at 06/08/16 0810  . QUEtiapine (SEROQUEL) tablet 300 mg  300 mg Oral QHS Neysa Hotter, MD   300 mg at 06/07/16 2101  . zolpidem (AMBIEN) tablet 5 mg  5 mg Oral QHS PRN Jomarie Longs, MD        PTA Medications: Prescriptions Prior to Admission  Medication Sig Dispense Refill Last Dose  . carbamazepine (TEGRETOL XR) 200 MG 12 hr tablet Take 200 mg by mouth 2  (two) times daily.   06/02/2016 at Unknown time  . ibuprofen (ADVIL,MOTRIN) 400 MG tablet Take 1 tablet (400 mg total) by mouth every 6 (six) hours as needed. 30 tablet 0 06/02/2016 at Unknown time  . prazosin (MINIPRESS) 1 MG capsule Take 3 capsules (3 mg total) by mouth at bedtime. For nightmares 30 capsule 0 06/02/2016 at Unknown time  . testosterone cypionate (DEPOTESTOTERONE CYPIONATE) 200 MG/ML injection Inject 0.5 mLs (100 mg total) into the muscle every 14 (fourteen) days. For low testosterone replacement 10 mL 0 06/02/2016 at Unknown time  . [DISCONTINUED] clonazePAM (KLONOPIN) 1 MG tablet Take 1 tablet (1 mg total) by mouth 4 (four) times daily. (Patient taking differently: Take 1 mg by mouth 3 (three) times daily. ) 12 tablet 0 06/02/2016 at Unknown time  . [DISCONTINUED] nicotine (NICODERM CQ - DOSED IN MG/24 HOURS) 21 mg/24hr patch Place 1 patch (21 mg total) onto the skin daily. For nicotine addiction 28 patch 0 06/02/2016 at Unknown time  . [DISCONTINUED] pantoprazole (PROTONIX) 20 MG tablet Take 1 tablet (20 mg total) by mouth daily. 14 tablet 0 06/02/2016 at Unknown time  . ARIPiprazole (ABILIFY) 15 MG tablet Take 0.5 tablets (7.5 mg total) by mouth at bedtime. For Depression/mood control (Patient not taking: Reported on 09/16/2014) 30 tablet 0 Unknown at  Unknown time  . carbamazepine (EQUETRO) 200 MG CP12 12 hr capsule Take 1 capsule (200 mg total) by mouth 2 (two) times daily. Mood stabilization (Patient not taking: Reported on 05/06/2015) 60 each 0 Unknown at Unknown time    Treatment Modalities: Medication Management, Group therapy, Case management,  1 to 1 session with clinician, Psychoeducation, Recreational therapy.  Patient Stressors:    Patient Strengths: Ability for insight Active sense of humor Average or above average intelligence Capable of independent living Communication skills  Physician Treatment Plan for Primary Diagnosis: PTSD (post-traumatic stress disorder) Long Term  Goal(s): Improvement in symptoms so as ready for discharge  Short Term Goals: Ability to identify changes in lifestyle to reduce recurrence of condition will improve Ability to verbalize feelings will improve Ability to disclose and discuss suicidal ideas Ability to demonstrate self-control will improve Ability to identify and develop effective coping behaviors will improve Ability to maintain clinical measurements within normal limits will improve Compliance with prescribed medications will improve Ability to identify triggers associated with substance abuse/mental health issues will improve Ability to identify changes in lifestyle to reduce recurrence of condition will improve Ability to verbalize feelings will improve Ability to disclose and discuss suicidal ideas Ability to demonstrate self-control will improve Ability to identify and develop effective coping behaviors will improve Ability to maintain clinical measurements within normal limits will improve Compliance with prescribed medications will improve Ability to identify triggers associated with substance abuse/mental health issues will improve  Medication Management: Evaluate patient's response, side effects, and tolerance of medication regimen.  Therapeutic Interventions: 1 to 1 sessions, Unit Group sessions and Medication administration.  Evaluation of Outcomes: Adequate for Discharge  Physician Treatment Plan for Secondary Diagnosis: Principal Problem:   PTSD (post-traumatic stress disorder) Active Problems:   Panic disorder   Suicidal ideation   MDD (major depressive disorder), recurrent severe, without psychosis (HCC)   Long Term Goal(s): Improvement in symptoms so as ready for discharge  Short Term Goals: Ability to identify changes in lifestyle to reduce recurrence of condition will improve Ability to verbalize feelings will improve Ability to disclose and discuss suicidal ideas Ability to demonstrate  self-control will improve Ability to identify and develop effective coping behaviors will improve Ability to maintain clinical measurements within normal limits will improve Compliance with prescribed medications will improve Ability to identify triggers associated with substance abuse/mental health issues will improve Ability to identify changes in lifestyle to reduce recurrence of condition will improve Ability to verbalize feelings will improve Ability to disclose and discuss suicidal ideas Ability to demonstrate self-control will improve Ability to identify and develop effective coping behaviors will improve Ability to maintain clinical measurements within normal limits will improve Compliance with prescribed medications will improve Ability to identify triggers associated with substance abuse/mental health issues will improve  Medication Management: Evaluate patient's response, side effects, and tolerance of medication regimen.  Therapeutic Interventions: 1 to 1 sessions, Unit Group sessions and Medication administration.  Evaluation of Outcomes: Adequate for Discharge   RN Treatment Plan for Primary Diagnosis: PTSD (post-traumatic stress disorder) Long Term Goal(s): Knowledge of disease and therapeutic regimen to maintain health will improve  Short Term Goals: Ability to verbalize feelings will improve, Ability to disclose and discuss suicidal ideas and Ability to identify and develop effective coping behaviors will improve  Medication Management: RN will administer medications as ordered by provider, will assess and evaluate patient's response and provide education to patient for prescribed medication. RN will report any adverse and/or side effects  to prescribing provider.  Therapeutic Interventions: 1 on 1 counseling sessions, Psychoeducation, Medication administration, Evaluate responses to treatment, Monitor vital signs and CBGs as ordered, Perform/monitor CIWA, COWS, AIMS and  Fall Risk screenings as ordered, Perform wound care treatments as ordered.  Evaluation of Outcomes: Adequate for Discharge   LCSW Treatment Plan for Primary Diagnosis: PTSD (post-traumatic stress disorder) Long Term Goal(s): Safe transition to appropriate next level of care at discharge, Engage patient in therapeutic group addressing interpersonal concerns.  Short Term Goals: Engage patient in aftercare planning with referrals and resources, Identify triggers associated with mental health/substance abuse issues and Increase skills for wellness and recovery  Therapeutic Interventions: Assess for all discharge needs, 1 to 1 time with Social worker, Explore available resources and support systems, Assess for adequacy in community support network, Educate family and significant other(s) on suicide prevention, Complete Psychosocial Assessment, Interpersonal group therapy.  Evaluation of Outcomes: Adequate for Discharge   Progress in Treatment: Attending groups: No Participating in groups: No  Taking medication as prescribed: Yes, MD continues to assess for medication changes as needed Toleration medication: Yes, no side effects reported at this time Family/Significant other contact made: No, Pt declines Patient understands diagnosis: Developing insight Discussing patient identified problems/goals with staff: Yes Medical problems stabilized or resolved: Yes Denies suicidal/homicidal ideation: Yes Issues/concerns per patient self-inventory: None Other: N/A  New problem(s) identified: None identified at this time.   New Short Term/Long Term Goal(s): None identified at this time.   Discharge Plan or Barriers: Pt will return home and follow-up with outpatient services at [facility]   Reason for Continuation of Hospitalization: None identified at this time.   Estimated Length of Stay: 0 days  Attendees: Patient: 06/08/2016  10:03 AM  Physician: Dr. Elna Breslow, MD  06/08/2016  10:03 AM  Nursing: Delfina Redwood, RN 06/08/2016  10:03 AM  RN Care Manager: Onnie Boer, RN 06/08/2016  10:03 AM  Social Worker: Vernie Shanks, LCSW; Donnelly Stager, LCSWA 06/08/2016  10:03 AM  Recreational Therapist:  06/08/2016  10:03 AM  Other: Armandina Stammer, NP; Gray Bernhardt, NP 06/08/2016  10:03 AM  Other:  06/08/2016  10:03 AM  Other: 06/08/2016  10:03 AM    Scribe for Treatment Team: Verdene Lennert, LCSW 06/08/2016 10:03 AM

## 2016-06-08 NOTE — Progress Notes (Signed)
Written/verbal discharge instructions, prescriptions  and follow-up appointments given to patient with verbalization of understanding;  Patient denies suicidal and homicidal ideation. Suicide Prevention information/materials given to patient  All patient belongings returned to patient at time of discharge. Discharged home in stable condition. 

## 2016-06-08 NOTE — Discharge Summary (Signed)
Physician Discharge Summary Note  Patient:  Phillip [name] is an 46 y.o., male MRN:  [id number] DOB:  [birth year] Patient phone:  [phone] (home)  Patient address:   [address] [place] [address],  Total Time spent with patient: Greater than 30 minutes  Date of Admission:  06/03/2016  Date of Discharge: 06/08/16  Reason for Admission: Worsening symptoms of depression & Suicidal ideations  Principal Problem: PTSD (post-traumatic stress disorder) , Major depressive disorder, recurrent episode, severe.  Discharge Diagnoses: Patient Active Problem List   Diagnosis Date Noted  . MDD (major depressive disorder), recurrent severe, without psychosis (HCC) [F33.2] 06/03/2016  . 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 Suicidal ideation [R45.851]   . Major depression [F32.9] 06/06/2014  . Suicidal ideations [R45.851] 06/05/2014  . Panic disorder [F41.0] 05/19/2014  . MDD (major depressive disorder), recurrent, severe, without Psychosis [F33.3] 05/18/2014  . PTSD (post-traumatic stress disorder) [F43.10] 01/29/2014  . Generalized anxiety disorder [F41.1] 06/19/2011    Class: Chronic   Musculoskeletal: Strength & Muscle Tone: within normal limits Gait & Station: normal Patient leans: N/A  Psychiatric Specialty Exam: Physical Exam  Constitutional: He appears well-developed.  HENT:  Head: Normocephalic.  Eyes: Pupils are equal, round, and reactive to light.  Neck: Normal range of motion.  Cardiovascular: Normal rate.   GI: Soft.  Genitourinary:  Genitourinary Comments: Deferred  Musculoskeletal: Normal range of motion.  Neurological: He is alert.  Skin: Skin is warm.  Psychiatric: His speech is normal and behavior is normal. Judgment and thought content normal. His mood appears not anxious. His affect is not angry, not blunt and not labile. Cognition and memory are normal. He does not exhibit a depressed mood.     Review of Systems  Constitutional: Negative.   HENT: Negative.   Eyes: Negative.   Respiratory: Negative.   Cardiovascular: Negative.   Gastrointestinal: Negative.   Genitourinary: Negative.   Musculoskeletal: Negative.   Skin: Negative.   Neurological: Negative.   Endo/Heme/Allergies: Negative.   Psychiatric/Behavioral: Positive for depression (Stabilized wit medication prior to discharge) and substance abuse (UDS positive for Benzodiazepine on admission.). Negative for hallucinations, memory loss and suicidal ideas. The patient has insomnia (Stabilized with medication prior to discharge). The patient is not nervous/anxious.     Blood pressure 130/85, pulse 97, temperature 97.5 F (36.4 C), temperature source Oral, resp. rate 18, height 5\' 5"  (1.651 m), weight 59.4 kg (131 lb), SpO2 100 %.Body mass index is 21.8 kg/m.  See Md's SRA   Past Medical History:  Past Medical History:  Diagnosis Date  . Anxiety   . Bipolar disorder (HCC)   . Depression   . PTSD (post-traumatic stress disorder)     Past Surgical History:  Procedure Laterality Date  . cholesterol    . WISDOM TOOTH EXTRACTION     Family History:  Family History  Problem Relation Age of Onset  . Cancer Mother   . Cancer Father   . Depression Father    Social History:  History  Alcohol Use No     History  Drug Use  . Types: Marijuana    Comment: seldom    Social History   Social History  . Marital status: Divorced    Spouse name: N/A  . Number of children: N/A  . Years of education: N/A   Social History Main Topics  . Smoking status: Current Every Day Smoker    Packs/day: 0.00    Years: 19.00  .  Smokeless tobacco: Never Used  . Alcohol use No  . Drug use: Yes    Types: Marijuana     Comment: seldom  . Sexual activity: Not Asked   Other Topics Concern  . None   Social History Narrative  . None   Risk to Self: Is patient at risk for suicide?: Yes What has been your use of drugs/alcohol  within the last 12 months?: None  Level of Care:  OP  Hospital Course: The patient presents as feeling generally confused and cloudy this morning. Reports that he has a significant headache, and he feels like he is having some symptoms of vertigo. He reports that his dose of clonazepam was decreased from 1 mg 3 times daily to 0.5 mg 3 times daily if needed.  He gives a fairly broken up story about how he had been initiated on clonazepam, Seroquel, and Vyvanse by a [place] psychiatrist, and this medication regimen was working perfectly for him. He reports that his girlfriend asked him to come back to [place], so he decided to do that, and he thought that his brain had been healed, so he stopped all his medications without a taper. He reports that he recently moved back here in the summer from [place], to get back together with his girlfriend. He reports that they broke up about a month after he came back, and he had already discontinued all his psychiatric medications from [place].  As such his mood continued to decline over the ensuing months. He owns about 3 acres of land, and a cabin, so he largely just stays to himself. He reports that he eventually started dating a new woman, who he reports is a psychiatric PA, but his mood continued to decline.    This is one of several discharge summaries from this hospital for [name]. [name] has been battling depression for a while. He stated that he is a 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 He reports having battling PTSD symptoms & can't seem seem to get over them. He believed that he can never be forgiven by God for all the awful things that he did. He reported having tried a lot of medications & nothing seem to have helped him as of date. He reported on admission that he had stopped his mental health medications for a while after he felt better, only to see his symptoms at it's worst this time. He  came to the hospital for mood stabilization treatments.   During this present hospital stay, [name] was assessed/evaluated & started on medication regimen targeting those presenting symptoms. He was medicated & discharged on Seroquel 100 mg & 300 mg respectively for agitation/mood control, Clonazepam 1 mg prn for severe anxiety, Mirtazapine 7.5 mg for insomnia & Nicotine patch 21 mg for smoking cessation. Dollie declined to take Minipress for nightmares & PTSD symptoms as he said it has not helped him in the past. He was also enrolled & participated in the group counseling sessions being offered and held on this unit. He learned coping skills. He also received other medication regimen for the other medical issues that he presented. He tolerated his treatment regimen without any adverse effects or reactions reported.   Naoki's symptoms eventually did respond well to his treatment regimen. This is evidenced by hie reports of improved mood, absence of suicidal ideations and presentation of good affects/eye contacts. He will resume psychiatric care & medication management on  outpatient basis as noted below. He is provided with all the necessary information needed to make these appointments without problems.  Upon discharge, Gabino adamantly denies any SIHI, AVH, delusional thoughts and or paranoia. He is provided with a 7 days worth, supply samples of his [facility] discharge medications. He left [facility] with all personal belongings in no apparent distress. Transportation per his arrangement.  Consults:  psychiatry  Discharge Vitals:   Blood pressure 130/85, pulse 97, temperature 97.5 F (36.4 C), temperature source Oral, resp. rate 18, height 5\' 5"  (1.651 m), weight 59.4 kg (131 lb), SpO2 100 %. Body mass index is 21.8 kg/m.  Lab Results:   No results found for this or any previous visit (from the past 72 hour(s)).  Physical Findings: AIMS: Facial and Oral Movements Muscles of Facial Expression: None,  normal Lips and Perioral Area: None, normal Jaw: None, normal Tongue: None, normal,Extremity Movements Upper (arms, wrists, hands, fingers): None, normal Lower (legs, knees, ankles, toes): None, normal, Trunk Movements Neck, shoulders, hips: None, normal, Overall Severity Severity of abnormal movements (highest score from questions above): None, normal Incapacitation due to abnormal movements: None, normal Patient's awareness of abnormal movements (rate only patient's report): No Awareness, Dental Status Current problems with teeth and/or dentures?: No Does patient usually wear dentures?: No  CIWA:  CIWA-Ar Total: 1 COWS:  COWS Total Score: 2  See Psychiatric Specialty Exam and Suicide Risk Assessment completed by Attending Physician prior to discharge.  Discharge destination:  Home  Is patient on multiple antipsychotic therapies at discharge:  No   Has Patient had three or more failed trials of antipsychotic monotherapy by history:  No  Recommended Plan for Multiple Antipsychotic Therapies: NA  Allergies as of 06/08/2016      Reactions   Mellaril [thioridazine] Swelling   Bilateral leg swelling   Testosterone    Other reaction(s): Anaphylaxis      Medication List    STOP taking these medications   ARIPiprazole 15 MG tablet Commonly known as:  ABILIFY   carbamazepine 200 MG 12 hr tablet Commonly known as:  TEGRETOL XR   carbamazepine 200 MG Cp12 12 hr capsule Commonly known as:  EQUETRO   ibuprofen 400 MG tablet Commonly known as:  ADVIL,MOTRIN   prazosin 1 MG capsule Commonly known as:  MINIPRESS   testosterone cypionate 200 MG/ML injection Commonly known as:  DEPOTESTOSTERONE CYPIONATE     TAKE these medications     Indication  clonazePAM 1 MG tablet Commonly known as:  KLONOPIN Take 1 tablet (1 mg total) by mouth 3 (three) times daily. For severe anxiety What changed:  when to take this  additional instructions  Indication:  Severe anxiety    mirtazapine 7.5 MG tablet Commonly known as:  REMERON Take 1 tablet (7.5 mg total) by mouth at bedtime. For insomnia  Indication:  Insomnia   nicotine 21 mg/24hr patch Commonly known as:  NICODERM CQ - dosed in mg/24 hours Place 1 patch (21 mg total) onto the skin daily. For nicotine addiction  Indication:  Nicotine Addiction   pantoprazole 20 MG tablet Commonly known as:  PROTONIX Take 1 tablet (20 mg total) by mouth daily. For acid reflux What changed:  additional instructions  Indication:  Gastroesophageal Reflux Disease   QUEtiapine 100 MG tablet Commonly known as:  SEROQUEL Take 1 tablet (100 mg total) by mouth 3 (three) times daily. For agitation  Indication:  Agitation   QUEtiapine 300 MG tablet Commonly known as:  SEROQUEL Take 1  tablet (300 mg total) by mouth at bedtime. For mood control  Indication:  Mood control      Follow-up Information    [facility] Follow up on 07/06/2016.   Specialty:  Behavioral Health Why:  at 9am with Dr. Rene KocherEksir for medication management.  Contact information: [address] [place] [id number] [phone]       [facility] Follow up.   Specialty:  Behavioral Health Why:  Please [facility] therapist [facility] are seen.  Contact information: [address] [place] [address] [place] [address] [phone]          Follow-up recommendations: Activity:  As tolerated Diet: As recommended by your primary care doctor. Keep all scheduled follow-up appointments as recommended.  Comments: Patient is instructed prior to discharge to: Take all medications as prescribed by his/her mental healthcare provider. Report any adverse effects and or reactions from the medicines to his/her outpatient provider promptly. Patient has been instructed & cautioned: To not engage in alcohol and or  illegal drug use while on prescription medicines. In the event of worsening symptoms, patient is instructed to call the crisis hotline, 911 and or go to the nearest ED for appropriate evaluation and treatment of symptoms. To follow-up with his/her primary care provider for your other medical issues, concerns and or health care needs.     Signed: Sanjuana KavaNwoko, Jacori Mulrooney I, PMHNp, FNP-BC 06/08/2016, 10:13 AM

## 2016-06-08 NOTE — Progress Notes (Signed)
CSW spoke with Phillip [name] in security who reports that documentation that Pt declines consent to speak with someone is sufficient action at this time, particularly because he denies intent and MD feels that Pt is safe for DC.  Phillip ShanksLauren Leng Montesdeoca, LCSW Clinical Social Work [phone]

## 2016-06-08 NOTE — Progress Notes (Signed)
D: Patient up and visible in the milieu after completing morning hygiene. Spoke with patient 1:1. Rates sleep as good, appetite as good, energy as normal and concentration as good. Patient's affect appropriate with congruent mood. Some anxiety noted. Rating depression and hopelessness both at a 2/10 and anxiety at a 3/10. States goal for today is to look at "after care plan." Denies pain however continues to experience heartburn.   A: Medicated per orders, prn maalox given. Emotional support offered and self inventory reviewed. Encouraged completion of Suicide Safety Plan. Discussed POC with MD, SW.    R: Patient verbalizes understanding of POC. On reassess, patient's reports decrease in discomfort. Patient denies SI/HI and remains safe on level III obs. States he is discharging today and feels safe in doing so.

## 2016-06-12 ENCOUNTER — Ambulatory Visit (HOSPITAL_COMMUNITY): Payer: Self-pay | Admitting: Psychology

## 2016-06-29 IMAGING — CR DG CHEST 2V
2 series · 2 of 2 positions shown · non-contrast
Comparison: 06/04/2013

CLINICAL DATA: Chest pain, shortness of Breath.

EXAM:
CHEST  2 VIEW

[w chest pa]
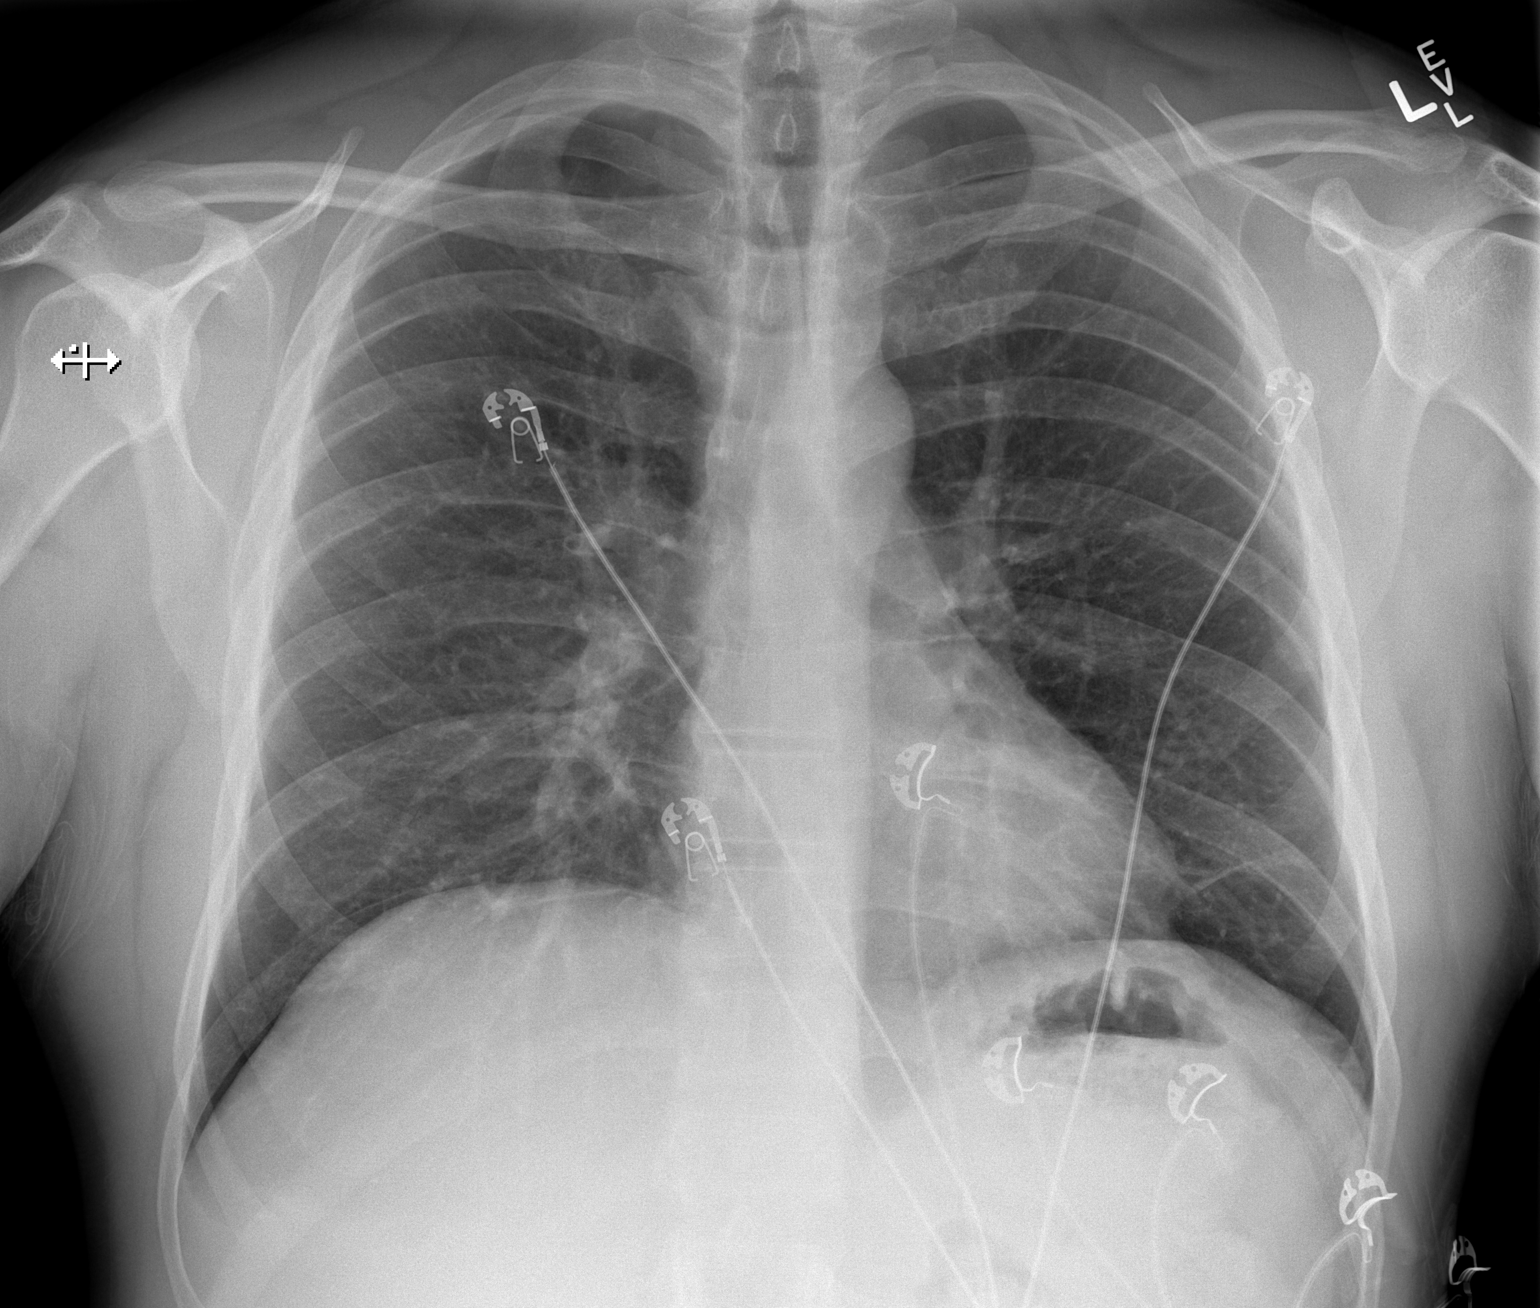

[w chest lat]
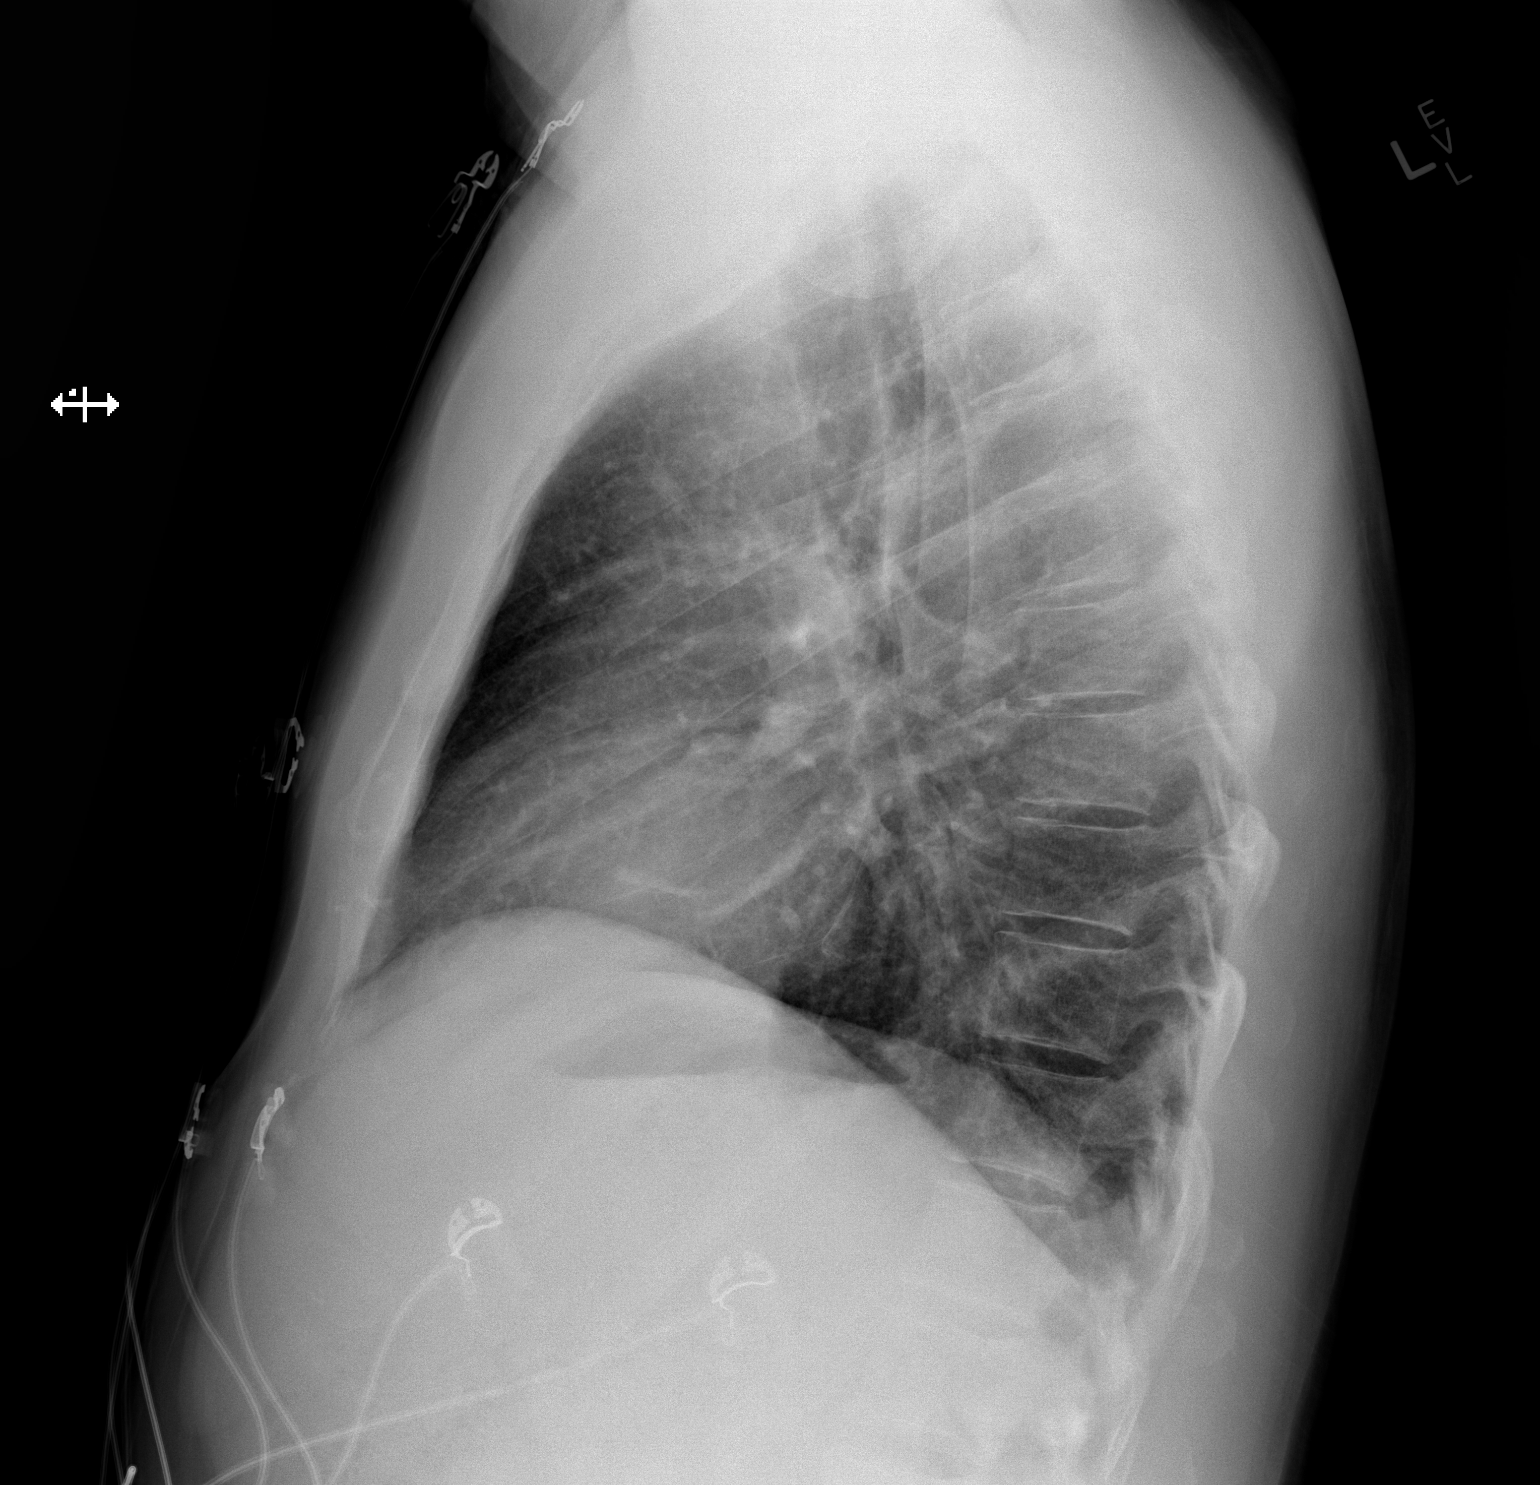

[2 of 2 positions shown; findings below may reference images not displayed]

FINDINGS: Minimal left basilar subsegmental atelectasis. Right lung is clear.
Heart is normal size. No effusions. No acute bony abnormality.
IMPRESSION: Left base atelectasis.  No active disease.

## 2016-07-06 ENCOUNTER — Encounter (HOSPITAL_COMMUNITY): Payer: Self-pay | Admitting: Psychiatry

## 2016-07-06 ENCOUNTER — Ambulatory Visit (INDEPENDENT_AMBULATORY_CARE_PROVIDER_SITE_OTHER): Payer: Medicare Other | Admitting: Psychiatry

## 2016-07-06 VITALS — BP 140/84 | HR 95 | Ht 70.0 in | Wt 177.0 lb

## 2016-07-06 DIAGNOSIS — Z79899 Other long term (current) drug therapy: Secondary | ICD-10-CM | POA: Diagnosis not present

## 2016-07-06 DIAGNOSIS — F332 Major depressive disorder, recurrent severe without psychotic features: Secondary | ICD-10-CM

## 2016-07-06 DIAGNOSIS — J209 Acute bronchitis, unspecified: Secondary | ICD-10-CM | POA: Diagnosis not present

## 2016-07-06 DIAGNOSIS — F4310 Post-traumatic stress disorder, unspecified: Secondary | ICD-10-CM

## 2016-07-06 DIAGNOSIS — Z818 Family history of other mental and behavioral disorders: Secondary | ICD-10-CM | POA: Diagnosis not present

## 2016-07-06 DIAGNOSIS — F319 Bipolar disorder, unspecified: Secondary | ICD-10-CM | POA: Diagnosis not present

## 2016-07-06 DIAGNOSIS — F17210 Nicotine dependence, cigarettes, uncomplicated: Secondary | ICD-10-CM | POA: Diagnosis not present

## 2016-07-06 DIAGNOSIS — F603 Borderline personality disorder: Secondary | ICD-10-CM | POA: Diagnosis not present

## 2016-07-06 MED ORDER — LISDEXAMFETAMINE DIMESYLATE 20 MG PO CAPS: 20.0000 mg | ORAL_CAPSULE | Freq: Every day | ORAL | 0 refills | Status: DC

## 2016-07-06 MED ORDER — QUETIAPINE FUMARATE 100 MG PO TABS: 100.0000 mg | ORAL_TABLET | Freq: Three times a day (TID) | ORAL | 2 refills | Status: DC

## 2016-07-06 MED ORDER — MIRTAZAPINE 15 MG PO TABS: 15.0000 mg | ORAL_TABLET | Freq: Every day | ORAL | 2 refills | Status: DC

## 2016-07-06 MED ORDER — QUETIAPINE FUMARATE 300 MG PO TABS: 300.0000 mg | ORAL_TABLET | Freq: Every day | ORAL | 2 refills | Status: DC

## 2016-07-06 MED ORDER — CLONAZEPAM 1 MG PO TABS: 1.0000 mg | ORAL_TABLET | Freq: Three times a day (TID) | ORAL | 2 refills | Status: DC

## 2016-07-06 NOTE — Progress Notes (Signed)
Psychiatric Initial Adult Assessment   Patient Identification: Tejay Hubert MRN:  [id number] Date of Evaluation:  07/06/2016 Referral Source: inpatient follow-up Chief Complaint:   Chief Complaint    Anxiety     Visit Diagnosis:    ICD-9-CM ICD-10-CM   1. Bipolar I disorder (HCC) 296.7 F31.9 clonazePAM (KLONOPIN) 1 MG tablet     QUEtiapine (SEROQUEL) 100 MG tablet     QUEtiapine (SEROQUEL) 300 MG tablet     lisdexamfetamine (VYVANSE) 20 MG capsule  2. PTSD (post-traumatic stress disorder) 309.81 F43.10 lisdexamfetamine (VYVANSE) 20 MG capsule  3. Major depressive disorder, recurrent, severe without psychotic features (HCC) 296.33 F33.2 mirtazapine (REMERON) 15 MG tablet  4. Borderline personality disorder 301.83 F60.3    History of Present Illness:  Eljay Lave is a 46 year old male with a complicated psychiatric history of posttraumatic stress disorder, moral injury from his military service, whereby he was involved in significant violence towards others during acts of war, a history of bipolar disorder, with an onset in his mid 24s, and a history of anxiety, panic attacks, and agoraphobia. He also struggles with symptoms of ADHD, and shares that he had been managed for ADHD by his last psychiatrist in [place].  I spent time learning about the patient's social history and recent psychiatric history. This Clinical research associate is familiar with the patient, as I did his initial assessment when he was on the psychiatric unit at [facility] behavioral health on March 11.  Overall, it appears that over the summer he had what appears to be a mixed episode of mania, was behaving in a disorganized fashion, making abrupt decisions and moved back to [place]. He has a history of volatile dating relationships, and volatility in his interpersonal relationships. He had initially presented to the psychiatric unit with significant agitation, lability, anxiety, depression, and significant thoughts of  suicide. He continued to struggle with significant PTSD symptoms related to his deployments to the [place].  He has a history of arrests for his volatile and at times impulsive behavior.  The patient reports that currently, his mood is fairly well managed with the Seroquel 100 mg 3 times daily, Seroquel 300 mg at night, and clonazepam 1 mg 3 times daily. He continues to have some difficulty sleeping at night, and wonders about increasing Remeron to 15 mg. We agreed to do this and reviewed the risks and benefits. He continues to have significant cognitive side effects, difficulty getting organized in his tasks, and difficulty with motivation for complex tasks such as finding a job. He often feels distractible, and can get lost in conversations with others.  He denies any suicidal thinking, and feels that his depression and mood swings are under good control with the Seroquel. We discussed potentially changing to Seroquel XR, but he prefers the immediate release, as it feels affirming to him that he is continuing to take the medicine for his mental health.  He reports that he had previously been managed on Vyvanse, and wonders if we can restart this medication. He reports that he was generally calming for him, and helped him be able to get organized with his tasks. He reports that he is living with an elderly woman, a family friend, in [place] and he is in charge of taking care of the yard, and does housework. He reports that he gets quite disorganized with his yard work and chores, and he feels like Vyvanse was able to help with these symptoms and his distractibility. We reviewed the risks and  benefits, and agreed to start at Vyvanse 20 mg.  He denies any history of seizures and denies any history of traumatic brain injury.  I spent time discussing options for therapy, and the patient was receptive to this. I recommended that he participate in the IOP program at this clinic, and he was agreeable to this.   We reviewed the expectations for this, and that he could continue to follow-up with this writer every 4-6 weeks for medication management. No other questions or concerns at this time.  He names his personal goals as being able to remain on his medications at stable doses, and to be able to pursue a job, and to "feel like a normal person again".  NCCSD reviewed.   Associated Signs/Symptoms: Depression Symptoms:  insomnia, fatigue, difficulty concentrating, anxiety, (Hypo) Manic Symptoms:  none current, controlled with seroquel Anxiety Symptoms:  Agoraphobia, Panic Symptoms, Social Anxiety, Psychotic Symptoms:  none PTSD Symptoms: Had a traumatic exposure:  Military trauma Re-experiencing:  Flashbacks Intrusive Thoughts Nightmares Hypervigilance:  Yes Hyperarousal:  Difficulty Concentrating Emotional Numbness/Detachment Increased Startle Response Irritability/Anger Sleep Avoidance:  Decreased Interest/Participation  Past Psychiatric History: Psychiatric history of multiple hospitalizations including 3 hospitalizations at behavioral health Hospital since 2015. He has had multiple hospitalizations in [place], [place], and other states in the area. He has a history of poor medication compliance. He has a history of self-harm.   Previous Psychotropic Medications: Yes, previously on Abilify, Ambien, Tegretol, Thorazine, clonazepam, Luvox, gabapentin, Vistaril, Remeron, prazosin, risperidone, Seroquel, trazodone  Substance Abuse History in the last 12 months:  No.  Consequences of Substance Abuse: Negative  Past Medical History:  Past Medical History:  Diagnosis Date  . Anxiety   . Bipolar disorder (HCC)   . Depression   . PTSD (post-traumatic stress disorder)     Past Surgical History:  Procedure Laterality Date  . cholesterol    . WISDOM TOOTH EXTRACTION      Family Psychiatric History: anxiety and panic attacks, father  Family History:  Family History   Problem Relation Age of Onset  . Cancer Mother   . Cancer Father   . Depression Father     Social History:   Social History   Social History  . Marital status: Divorced    Spouse name: N/A  . Number of children: N/A  . Years of education: N/A   Social History Main Topics  . Smoking status: Current Every Day Smoker    Packs/day: 0.60    Years: 19.00    Types: Cigarettes  . Smokeless tobacco: Never Used  . Alcohol use No  . Drug use: No     Comment: seldom  . Sexual activity: Not Currently   Other Topics Concern  . None   Social History Narrative  . None    Additional Social History: Currently lives in [place], with a family friend. Not currently working. Patient is a veteran   Allergies:   Allergies  Allergen Reactions  . Mellaril [Thioridazine] Swelling    Bilateral leg swelling  . Testosterone     Other reaction(s): Anaphylaxis    Metabolic Disorder Labs: Lab Results  Component Value Date   HGBA1C 5.2 06/04/2016   MPG 103 06/04/2016   MPG 117 06/15/2014   Lab Results  Component Value Date   PROLACTIN 19.5 (H) 06/04/2016   Lab Results  Component Value Date   CHOL 313 (H) 06/04/2016   TRIG 299 (H) 06/04/2016   HDL 46 06/04/2016   CHOLHDL 6.8 06/04/2016  VLDL 60 (H) 06/04/2016   LDLCALC 207 (H) 06/04/2016   LDLCALC 132 (H) 01/29/2014     Current Medications: Current Outpatient Prescriptions  Medication Sig Dispense Refill  . clonazePAM (KLONOPIN) 1 MG tablet Take 1 tablet (1 mg total) by mouth 3 (three) times daily. For severe anxiety 90 tablet 2  . mirtazapine (REMERON) 15 MG tablet Take 1 tablet (15 mg total) by mouth at bedtime. For insomnia 30 tablet 2  . QUEtiapine (SEROQUEL) 100 MG tablet Take 1 tablet (100 mg total) by mouth 3 (three) times daily. 90 tablet 2  . QUEtiapine (SEROQUEL) 300 MG tablet Take 1 tablet (300 mg total) by mouth at bedtime. For mood control 30 tablet 2  . lisdexamfetamine (VYVANSE) 20 MG capsule Take 1 capsule  (20 mg total) by mouth daily. 30 capsule 0  . nicotine (NICODERM CQ - DOSED IN MG/24 HOURS) 21 mg/24hr patch Place 1 patch (21 mg total) onto the skin daily. For nicotine addiction (Patient not taking: Reported on 07/06/2016) 28 patch 0  . pantoprazole (PROTONIX) 20 MG tablet Take 1 tablet (20 mg total) by mouth daily. For acid reflux (Patient not taking: Reported on 07/06/2016) 14 tablet 0   No current facility-administered medications for this visit.     Neurologic: Headache: Negative Seizure: Negative Paresthesias:Negative  Musculoskeletal: Strength & Muscle Tone: within normal limits Gait & Station: normal Patient leans: N/A  Psychiatric Specialty Exam: Review of Systems  Constitutional: Negative.   Respiratory: Negative.   Cardiovascular: Negative.   Gastrointestinal: Negative.   Musculoskeletal: Negative.   Neurological: Negative.   Psychiatric/Behavioral: Negative for substance abuse and suicidal ideas. The patient is nervous/anxious and has insomnia.     Blood pressure 140/84, pulse 95, height  (1.778 m), weight 177 lb (80.3 kg), SpO2 97 %.Body mass index is 25.4 kg/m.  General Appearance: Casual and Well Groomed  Eye Contact:  Good  Speech:  Clear and Coherent and Normal Rate  Volume:  Normal  Mood:  Anxious  Affect:  Congruent and anxious, worried  Thought Process:  Coherent and Goal Directed  Orientation:  Full (Time, Place, and Person)  Thought Content:  Logical  Suicidal Thoughts:  No  Homicidal Thoughts:  No  Memory:  Immediate;   Fair  Judgement:  Fair  Insight:  Shallow  Psychomotor Activity:  Normal  Concentration:  Concentration: Fair  Recall:  NA  Fund of Knowledge:Fair  Language: Fair  Akathisia:  Negative  Handed:  Right  AIMS (if indicated):  na  Assets:  Communication Skills Desire for Improvement Housing Physical Health Transportation  ADL's:  Intact  Cognition: WNL  Sleep:  4 hours nightly    Treatment Plan Summary: Kester Stimpson is a 46 year old male veteran with a history of PTSD from [employer] combat, panic disorder with agoraphobia, bipolar disorder, ADHD, and borderline personality disorder who presents today for psychiatric assessment and intake. His mood lability appears to be currently stable on the regimen of Seroquel and clonazepam. He continues to struggle with some anxiety in public places, and continues to struggle with feelings of poor self-worth.  He has a self-reported history of ADHD, and appears to describe symptoms of cognitive dulling and inattention, likely complicated by his antipsychotic regimen. He has previously responded well to Vyvanse.  Given Vyvanse's safety profile, I'm agreeable to trial of the medication.  He denies any suicidal thoughts or behaviors. He does not appear to be engaging in any substance abuse.   He has a history  of multiple psychiatric hospitalizations, and is high risk for harm to himself. I'd like to see him engage in the IOP program here, and he has agreed to do so. Will proceed as below and follow up in 4 weeks.  1. Bipolar I disorder (HCC)   2. PTSD (post-traumatic stress disorder)   3. Major depressive disorder, recurrent, severe without psychotic features (HCC)   4. Borderline personality disorder    Continue Seroquel 100 mg 3 times daily, and 300 mg nightly for bipolar disorder Continue clonazepam 1 mg 3 times daily for panic disorder with agoraphobia, and bipolar Initiate Vyvanse 20 mg daily for inattention, distractibility, ADHD symptoms Increase Remeron to 15 mg nightly Follow-up with this writer in 4 weeks Patient is agreeable to initiate IOP at this clinic, coordinate for intake  Burnard Leigh, MD 4/12/201810:03 AM

## 2016-07-10 ENCOUNTER — Telehealth (HOSPITAL_COMMUNITY): Payer: Self-pay

## 2016-07-10 NOTE — Telephone Encounter (Signed)
Fax received from Cover my Meds for patients Seroquel 100 mg tid. I called [employer] and s/w Patty. Authorization # is [id number] and the reference # for the call is [id number]. Authorization is good from 07/10/2016 - 07/05/2017

## 2016-07-20 ENCOUNTER — Telehealth (HOSPITAL_COMMUNITY): Payer: Self-pay | Admitting: *Deleted

## 2016-07-20 NOTE — Telephone Encounter (Signed)
Prior authorization for Seroquel received. Called [employer] spoke with Gabriel Rung who states that patient has Medicare part D that will become effective 07/25/16 and this PA will need to be submitted for review by their pharmacist to see how long it can be covered since it will go through Medicare once active. [id number].

## 2016-07-26 ENCOUNTER — Ambulatory Visit (INDEPENDENT_AMBULATORY_CARE_PROVIDER_SITE_OTHER): Payer: Medicare Other | Admitting: Psychiatry

## 2016-07-26 DIAGNOSIS — Z818 Family history of other mental and behavioral disorders: Secondary | ICD-10-CM | POA: Diagnosis not present

## 2016-07-26 DIAGNOSIS — K219 Gastro-esophageal reflux disease without esophagitis: Secondary | ICD-10-CM

## 2016-07-26 DIAGNOSIS — F17210 Nicotine dependence, cigarettes, uncomplicated: Secondary | ICD-10-CM

## 2016-07-26 DIAGNOSIS — F319 Bipolar disorder, unspecified: Secondary | ICD-10-CM

## 2016-07-26 DIAGNOSIS — F603 Borderline personality disorder: Secondary | ICD-10-CM

## 2016-07-26 DIAGNOSIS — Z79899 Other long term (current) drug therapy: Secondary | ICD-10-CM

## 2016-07-26 DIAGNOSIS — F4310 Post-traumatic stress disorder, unspecified: Secondary | ICD-10-CM | POA: Diagnosis not present

## 2016-07-26 MED ORDER — OMEPRAZOLE 20 MG PO CPDR: 20.0000 mg | DELAYED_RELEASE_CAPSULE | Freq: Every day | ORAL | 1 refills | Status: DC

## 2016-07-26 MED ORDER — LISDEXAMFETAMINE DIMESYLATE 30 MG PO CAPS: 30.0000 mg | ORAL_CAPSULE | Freq: Every day | ORAL | 0 refills | Status: DC

## 2016-07-26 NOTE — Progress Notes (Signed)
BH MD/PA/NP OP Progress Note  07/26/2016 4:31 PM Phillip [name]  MRN:  [id number]  Chief Complaint:  med management  Subjective: Phillip [name] presents for follow-up. He reports he is tolerated Vyvanse 20 mg, but it has had some diminishing of its effect.  He particularly continues to struggle with feeling scatterbrained, difficulty organizing his tasks, and feels forgetful. I educated him that this is also likely due in part to clonazepam.  He understands that the goal will be eventually to reduce his clonazepam.  He denies any increase in anxiety. He continues on clonazepam, Seroquel, and Remeron. He reports that he is sleeping well at night. He denies any suicidal thoughts. He has been able to be more forward thinking and future oriented, and has been thinking about getting back into the workforce. He reports that he has good social support and has been making friends. Denies any auditory or visual hallucinations. No symptoms of mania. He reports that he will be engaging in the intensive outpatient program here in the office starting tomorrow.  Visit Diagnosis:    ICD-9-CM ICD-10-CM   1. Gastroesophageal reflux disease, esophagitis presence not specified 530.81 K21.9 omeprazole (PRILOSEC) 20 MG capsule  2. PTSD (post-traumatic stress disorder) 309.81 F43.10 lisdexamfetamine (VYVANSE) 30 MG capsule  3. Bipolar I disorder (HCC) 296.7 F31.9 lisdexamfetamine (VYVANSE) 30 MG capsule  4. Borderline personality disorder 301.83 F60.3     Past Psychiatric History: See intake H&P for full details. Reviewed, with no updates at this time.  Past Medical History:  Past Medical History:  Diagnosis Date  . Anxiety   . Bipolar disorder (HCC)   . Depression   . PTSD (post-traumatic stress disorder)     Past Surgical History:  Procedure Laterality Date  . cholesterol    . WISDOM TOOTH EXTRACTION      Family Psychiatric History: See intake H&P for full details. Reviewed, with no updates at  this time.   Family History:  Family History  Problem Relation Age of Onset  . Cancer Mother   . Cancer Father   . Depression Father     Social History:  Social History   Social History  . Marital status: Divorced    Spouse name: N/A  . Number of children: N/A  . Years of education: N/A   Social History Main Topics  . Smoking status: Current Every Day Smoker    Packs/day: 0.60    Years: 19.00    Types: Cigarettes  . Smokeless tobacco: Never Used  . Alcohol use No  . Drug use: No     Comment: seldom  . Sexual activity: Not Currently   Other Topics Concern  . Not on file   Social History Narrative  . No narrative on file    Allergies:  Allergies  Allergen Reactions  . Mellaril [Thioridazine] Swelling    Bilateral leg swelling  . Testosterone     Other reaction(s): Anaphylaxis    Metabolic Disorder Labs: Lab Results  Component Value Date   HGBA1C 5.2 06/04/2016   MPG 103 06/04/2016   MPG 117 06/15/2014   Lab Results  Component Value Date   PROLACTIN 19.5 (H) 06/04/2016   Lab Results  Component Value Date   CHOL 313 (H) 06/04/2016   TRIG 299 (H) 06/04/2016   HDL 46 06/04/2016   CHOLHDL 6.8 06/04/2016   VLDL 60 (H) 06/04/2016   LDLCALC 207 (H) 06/04/2016   LDLCALC 132 (H) 01/29/2014     Current Medications: Current Outpatient  Prescriptions  Medication Sig Dispense Refill  . clonazePAM (KLONOPIN) 1 MG tablet Take 1 tablet (1 mg total) by mouth 3 (three) times daily. For severe anxiety 90 tablet 2  . lisdexamfetamine (VYVANSE) 30 MG capsule Take 1 capsule (30 mg total) by mouth daily. 30 capsule 0  . mirtazapine (REMERON) 15 MG tablet Take 1 tablet (15 mg total) by mouth at bedtime. For insomnia 30 tablet 2  . nicotine (NICODERM CQ - DOSED IN MG/24 HOURS) 21 mg/24hr patch Place 1 patch (21 mg total) onto the skin daily. For nicotine addiction (Patient not taking: Reported on 07/06/2016) 28 patch 0  . omeprazole (PRILOSEC) 20 MG capsule Take 1  capsule (20 mg total) by mouth daily. 30 capsule 1  . pantoprazole (PROTONIX) 20 MG tablet Take 1 tablet (20 mg total) by mouth daily. For acid reflux (Patient not taking: Reported on 07/06/2016) 14 tablet 0  . QUEtiapine (SEROQUEL) 100 MG tablet Take 1 tablet (100 mg total) by mouth 3 (three) times daily. 90 tablet 2  . QUEtiapine (SEROQUEL) 300 MG tablet Take 1 tablet (300 mg total) by mouth at bedtime. For mood control 30 tablet 2   No current facility-administered medications for this visit.     Neurologic: Headache: Negative Seizure: Negative Paresthesias: Negative  Musculoskeletal: Strength & Muscle Tone: within normal limits Gait & Station: normal Patient leans: N/A  Psychiatric Specialty Exam: ROS  There were no vitals taken for this visit.There is no height or weight on file to calculate BMI.  General Appearance: Casual and Well Groomed  Eye Contact:  Good  Speech:  Clear and Coherent  Volume:  Normal  Mood:  Anxious  Affect:  Congruent  Thought Process:  Goal Directed  Orientation:  Full (Time, Place, and Person)  Thought Content: Logical   Suicidal Thoughts:  No  Homicidal Thoughts:  No  [name]:  Immediate;   Good  Judgement:  Good  Insight:  Fair  Psychomotor Activity:  Normal  Concentration:  Concentration: Good  Recall:  Good  Fund of Knowledge: Good  Language: Good  Akathisia:  Negative  Handed:  Right  AIMS (if indicated):  0  Assets:  Communication Skills Desire for Improvement Housing Physical Health Transportation  ADL's:  Intact  Cognition: WNL  Sleep:  7-9 hours    Treatment Plan Summary: Phillip [name] is a 46 year old combat veteran with a history of PTSD, and ADHD, and addition to bipolar disorder, who presents today for medication management follow-up.  He presents with features of borderline personality disorder.  He presents on a complex education regimen, but appears to be improving in terms of his function, and will be  participating in the [facility] program.  Given his multiple hospitalizations, and his severity of illness, I'm confident he will benefit from participation in this program.  No acute safety issues at this time, and we will proceed as below and follow up in 1 month.  1. Gastroesophageal reflux disease, esophagitis presence not specified   2. PTSD (post-traumatic stress disorder)   3. Bipolar I disorder (HCC)   4. Borderline personality disorder    Continue clonazepam 1 mg 3 times daily Vyvanse increased to 30 mg daily Continue Seroquel 300 mg nightly Continue Seroquel 100 mg 3 times daily  Continue Remeron 15 mg nightly Follow-up in 1 month Participation in PHP starting tomorrow  Burnard Leigh, MD 07/26/2016, 4:31 PM

## 2016-08-04 ENCOUNTER — Other Ambulatory Visit (HOSPITAL_COMMUNITY): Payer: Self-pay

## 2016-08-04 DIAGNOSIS — F4310 Post-traumatic stress disorder, unspecified: Secondary | ICD-10-CM

## 2016-08-04 DIAGNOSIS — F319 Bipolar disorder, unspecified: Secondary | ICD-10-CM

## 2016-08-04 MED ORDER — LISDEXAMFETAMINE DIMESYLATE 30 MG PO CAPS: 30.0000 mg | ORAL_CAPSULE | Freq: Every day | ORAL | 0 refills | Status: DC

## 2016-08-16 ENCOUNTER — Ambulatory Visit (INDEPENDENT_AMBULATORY_CARE_PROVIDER_SITE_OTHER): Payer: Medicare Other | Admitting: Psychiatry

## 2016-08-16 VITALS — BP 140/86 | HR 115 | Ht 70.0 in | Wt 188.0 lb

## 2016-08-16 DIAGNOSIS — F603 Borderline personality disorder: Secondary | ICD-10-CM | POA: Diagnosis not present

## 2016-08-16 DIAGNOSIS — Z765 Malingerer [conscious simulation]: Secondary | ICD-10-CM

## 2016-08-16 DIAGNOSIS — F17210 Nicotine dependence, cigarettes, uncomplicated: Secondary | ICD-10-CM

## 2016-08-16 DIAGNOSIS — F332 Major depressive disorder, recurrent severe without psychotic features: Secondary | ICD-10-CM

## 2016-08-16 DIAGNOSIS — Z818 Family history of other mental and behavioral disorders: Secondary | ICD-10-CM

## 2016-08-16 MED ORDER — MIRTAZAPINE 30 MG PO TABS
30.0000 mg | ORAL_TABLET | Freq: Every day | ORAL | 0 refills | Status: DC
Start: 2016-08-16 — End: 2016-11-23
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
## 2016-08-28 ENCOUNTER — Ambulatory Visit (INDEPENDENT_AMBULATORY_CARE_PROVIDER_SITE_OTHER): Payer: Medicare Other | Admitting: Psychiatry

## 2016-08-28 DIAGNOSIS — Z79899 Other long term (current) drug therapy: Secondary | ICD-10-CM | POA: Diagnosis not present

## 2016-08-28 DIAGNOSIS — F331 Major depressive disorder, recurrent, moderate: Secondary | ICD-10-CM | POA: Diagnosis not present

## 2016-08-28 DIAGNOSIS — G4730 Sleep apnea, unspecified: Secondary | ICD-10-CM

## 2016-08-28 DIAGNOSIS — F17210 Nicotine dependence, cigarettes, uncomplicated: Secondary | ICD-10-CM

## 2016-08-28 DIAGNOSIS — Z818 Family history of other mental and behavioral disorders: Secondary | ICD-10-CM

## 2016-08-28 DIAGNOSIS — F4312 Post-traumatic stress disorder, chronic: Secondary | ICD-10-CM

## 2016-08-28 NOTE — Progress Notes (Signed)
BH MD/PA/NP OP Progress Note  08/28/2016 4:45 PM Phillip [name]  MRN:  [id number]  Chief Complaint: just tired, but mood is good  Subjective:  Phillip [name] resents for follow-up. He reports that he has been tired with the increase in Remeron, and he thinks he is gaining weight from eating more. Discussed that this is likely due to the absence of stimulant plus his regimen of Seroquel, Remeron, clonazepam, as they all contribute to weight gain. He shares that he would rather be "fat and happy" then depressed and thin. He is working on exercising more and watching his diet. He is agreeable to set up individual therapy in this clinic, as he admits that he did not keep his therapy appointment that he had told writer about at this last visit.  Spent time discussing his avoidance of therapy, and what he makes of this. He admits that he doesn't want to talk about any of his past traumas and is worried people bring that up. I encouraged him to let his therapist know that, when he does establish care.  We agreed to keep the medication regimen at the current doses, and to follow up in 1 month. Regarding his sedation, I inquired about his sleep, and he shares that he is having snoring every night. He reports that his girlfriends have always told him that he is a very heavy snorer, and seems to have trouble breathing through his nose at night. We agreed to order for a polysomnography given that he is having this sleep-disordered breathing, and also reports that he has periods of sleepwalking as well.  Visit Diagnosis:    ICD-9-CM ICD-10-CM   1. Sleep-disordered breathing 780.59 G47.30 Polysomnography 4 or more parameters     TSH     Vitamin D 1,25 dihydroxy     CBC With Differential     Testosterone Free, Profile I  2. Chronic post-traumatic stress disorder (PTSD) 309.81 F43.12 TSH     Vitamin D 1,25 dihydroxy     CBC With Differential     Testosterone Free, Profile I  3. Moderate episode of  recurrent major depressive disorder (HCC) 296.32 F33.1 TSH     Vitamin D 1,25 dihydroxy     CBC With Differential     Testosterone Free, Profile I    Past Psychiatric History: See intake H&P for full details. Reviewed, with no updates at this time.  Past Medical History:  Past Medical History:  Diagnosis Date  . Anxiety   . Bipolar disorder (HCC)   . Depression   . PTSD (post-traumatic stress disorder)     Past Surgical History:  Procedure Laterality Date  . cholesterol    . WISDOM TOOTH EXTRACTION      Family Psychiatric History: See intake H&P for full details. Reviewed, with no updates at this time.   Family History:  Family History  Problem Relation Age of Onset  . Cancer Mother   . Cancer Father   . Depression Father     Social History:  Social History   Social History  . Marital status: Divorced    Spouse name: N/A  . Number of children: N/A  . Years of education: N/A   Social History Main Topics  . Smoking status: Current Every Day Smoker    Packs/day: 0.60    Years: 19.00    Types: Cigarettes  . Smokeless tobacco: Never Used  . Alcohol use No  . Drug use: No     Comment: seldom  .  Sexual activity: Not Currently   Other Topics Concern  . Not on file   Social History Narrative  . No narrative on file    Allergies:  Allergies  Allergen Reactions  . Mellaril [Thioridazine] Swelling    Bilateral leg swelling  . Testosterone     Other reaction(s): Anaphylaxis    Metabolic Disorder Labs: Lab Results  Component Value Date   HGBA1C 5.2 06/04/2016   MPG 103 06/04/2016   MPG 117 06/15/2014   Lab Results  Component Value Date   PROLACTIN 19.5 (H) 06/04/2016   Lab Results  Component Value Date   CHOL 313 (H) 06/04/2016   TRIG 299 (H) 06/04/2016   HDL 46 06/04/2016   CHOLHDL 6.8 06/04/2016   VLDL 60 (H) 06/04/2016   LDLCALC 207 (H) 06/04/2016   LDLCALC 132 (H) 01/29/2014     Current Medications: Current Outpatient Prescriptions   Medication Sig Dispense Refill  . clonazePAM (KLONOPIN) 1 MG tablet Take 1 tablet (1 mg total) by mouth 3 (three) times daily. For severe anxiety 90 tablet 2  . mirtazapine (REMERON) 30 MG tablet Take 1 tablet (30 mg total) by mouth at bedtime. For insomnia 90 tablet 0  . omeprazole (PRILOSEC) 20 MG capsule Take 1 capsule (20 mg total) by mouth daily. 30 capsule 1  . QUEtiapine (SEROQUEL) 100 MG tablet Take 1 tablet (100 mg total) by mouth 3 (three) times daily. 90 tablet 2  . QUEtiapine (SEROQUEL) 300 MG tablet Take 1 tablet (300 mg total) by mouth at bedtime. For mood control 30 tablet 2   No current facility-administered medications for this visit.     Neurologic: Headache: Negative Seizure: Negative Paresthesias: Negative  Musculoskeletal: Strength & Muscle Tone: within normal limits Gait & Station: normal Patient leans: N/A  Psychiatric Specialty Exam: ROS  There were no vitals taken for this visit.There is no height or weight on file to calculate BMI.  General Appearance: Casual and Fairly Groomed  Eye Contact:  Good  Speech:  Clear and Coherent  Volume:  Normal  Mood:  Euthymic  Affect:  Congruent  Thought Process:  Coherent and Goal Directed  Orientation:  Full (Time, Place, and Person)  Thought Content: Logical   Suicidal Thoughts:  No  Homicidal Thoughts:  No  [name]:  Immediate;   Good  Judgement:  Fair  Insight:  Shallow  Psychomotor Activity:  Normal  Concentration:  Concentration: Good  Recall:  Good  Fund of Knowledge: Good  Language: Good  Akathisia:  Negative  Handed:  Right  AIMS (if indicated):  0  Assets:  Communication Skills Desire for Improvement Financial Resources/Insurance Housing Intimacy Physical Health Social Support Talents/Skills Transportation  ADL's:  Intact  Cognition: WNL  Sleep:  7   Treatment Plan Summary: Phillip [name] is a 46 year old male with a history of chronic PTSD and borderline personality disorder in  addition to bipolar spectrum illness.  He presents today for a sooner medication management follow-up, as we discontinued stimulant at our last visit. He appears to be doing well, and in fact seems more calm and euthymic without the stimulant. No acute safety issues and will follow up in 1 month. He continues to be avoidant of establishing therapeutic care, and I continue to encourage that he consider this next step in his treatment.  I suspect will once again asked for stimulant, but I believe that this is counterproductive for his PTSD. I am sending him for polysomnography given his sleep disordered breathing and  parasomnia symptoms.  1. Sleep-disordered breathing   2. Chronic post-traumatic stress disorder (PTSD)   3. Moderate episode of recurrent major depressive disorder (HCC)    Continue clonazepam 1 mg 3 times daily Continue Remeron 30 mg nightly Continue Seroquel 100 mg 3 times daily, and 300 mg nightly Follow-up on sleep study results Follow-up in 1 month Arrange for therapy in this clinic with Junious DresserWes Swan  Phillip Buendia Arya Denica Web, MD 08/28/2016, 4:45 PM

## 2016-08-28 NOTE — Patient Instructions (Signed)
Call the sleep clinic below to get scheduled  [phone] [address]  [address]  [place], [place] [id number]

## 2016-09-01 LAB — CBC WITH DIFFERENTIAL
BASOS: 0 %
Basophils Absolute: 0 10*3/uL (ref 0.0–0.2)
EOS (ABSOLUTE): 0.2 10*3/uL (ref 0.0–0.4)
Eos: 2 %
Hematocrit: 44.3 % (ref 37.5–51.0)
Hemoglobin: 14.9 g/dL (ref 13.0–17.7)
Immature Grans (Abs): 0 10*3/uL (ref 0.0–0.1)
Immature Granulocytes: 0 %
Lymphocytes Absolute: 2.2 10*3/uL (ref 0.7–3.1)
Lymphs: 29 %
MCH: 32 pg (ref 26.6–33.0)
MCHC: 33.6 g/dL (ref 31.5–35.7)
MCV: 95 fL (ref 79–97)
MONOS ABS: 0.8 10*3/uL (ref 0.1–0.9)
Monocytes: 10 %
NEUTROS PCT: 59 %
Neutrophils Absolute: 4.6 10*3/uL (ref 1.4–7.0)
RBC: 4.65 x10E6/uL (ref 4.14–5.80)
RDW: 14 % (ref 12.3–15.4)
WBC: 7.8 10*3/uL (ref 3.4–10.8)

## 2016-09-01 LAB — VITAMIN D 1,25 DIHYDROXY
VITAMIN D 1, 25 (OH) TOTAL: 39 pg/mL
Vitamin D2 1, 25 (OH)2: <10 pg/mL
Vitamin D3 1, 25 (OH)2: 36 pg/mL

## 2016-09-01 LAB — TESTOSTERONE FREE, PROFILE I
Albumin: 5 g/dL (ref 3.5–5.5)
Sex Hormone Binding: 58.8 nmol/L — ABNORMAL HIGH (ref 16.5–55.9)
TESTOST., FREE, CALC: 44.3 pg/mL (ref 30.3–183.2)
Testosterone: 346 ng/dL (ref 264–916)

## 2016-09-01 LAB — TSH: TSH: 4.83 u[IU]/mL — AB (ref 0.450–4.500)

## 2016-09-28 ENCOUNTER — Ambulatory Visit (INDEPENDENT_AMBULATORY_CARE_PROVIDER_SITE_OTHER): Payer: Medicare Other | Admitting: Psychiatry

## 2016-09-28 ENCOUNTER — Encounter (HOSPITAL_COMMUNITY): Payer: Self-pay | Admitting: Psychiatry

## 2016-09-28 ENCOUNTER — Telehealth (HOSPITAL_COMMUNITY): Payer: Self-pay

## 2016-09-28 VITALS — BP 144/82 | HR 110 | Ht 69.0 in | Wt 191.0 lb

## 2016-09-28 DIAGNOSIS — F319 Bipolar disorder, unspecified: Secondary | ICD-10-CM

## 2016-09-28 DIAGNOSIS — G4730 Sleep apnea, unspecified: Secondary | ICD-10-CM

## 2016-09-28 DIAGNOSIS — F603 Borderline personality disorder: Secondary | ICD-10-CM | POA: Diagnosis not present

## 2016-09-28 DIAGNOSIS — Z765 Malingerer [conscious simulation]: Secondary | ICD-10-CM

## 2016-09-28 DIAGNOSIS — F4312 Post-traumatic stress disorder, chronic: Secondary | ICD-10-CM

## 2016-09-28 DIAGNOSIS — F332 Major depressive disorder, recurrent severe without psychotic features: Secondary | ICD-10-CM | POA: Diagnosis not present

## 2016-09-28 MED ORDER — CLONAZEPAM 1 MG PO TABS: ORAL_TABLET | ORAL | 1 refills | Status: DC

## 2016-09-28 NOTE — Telephone Encounter (Signed)
I called it in to the pharmacy just about 30 minutes ago so he should be all set

## 2016-09-28 NOTE — Telephone Encounter (Signed)
Patient called and said the pharmacy does not have any refills on his Klonopin and he only has enough for tomorrow. Please advise, thank you

## 2016-09-28 NOTE — Progress Notes (Signed)
BH MD/PA/NP OP Progress Note  09/28/2016 11:38 AM Phillip [name]  MRN:  [id number]  Chief Complaint:  Chief Complaint    Follow-up    med management follow-up  Subjective:  Phillip [name] presents today for medication management follow-up. He spends much of the session talking about how he is anxious because he has multiple tasks he needs to get done such as working on his friend's cars, going on a trip with his girlfriend, going on a trip with his landlord, and he just doesn't feel like he has energy or organizational levels to do this. He continues to talk around the subject of being on a stimulant for about 15 minutes.  He reports that he feels tired much of the day, and feels like he has trouble thinking. When I suggested that we taper his clonazepam, because this might be making him feel tired, and contributing to some cognitive dulling, he asked if he could be on a stimulant instead.  He asked why he can't just be on Vyvanse once again, and I reminded him of the habit-forming nature of using these 2 medications together.  He continued to behave disappointed, and continued to angle for stimulants.  I asked the patient if he is set up his individual therapy appointment, and he admits that he missed it. I asked him if he has gotten his sleep study and he admits that he has not scheduled that. I encouraged him to do both of those things, and consider once again participation in the IOP in this office. I also recommended that we get neuropsychological testing, to clarify where his areas of need are.  He does not present with any inattention, racing thoughts, and does not discuss topics tangentially. He does not seem impulsive or restless. Nothing about his presentation today is consistent with ADHD with a need for stimulant.  Visit Diagnosis:    ICD-10-CM   1. Sleep-disordered breathing G47.30 Home sleep test  2. Chronic post-traumatic stress disorder (PTSD) F43.12 Neuropsychological  testing  3. 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 Borderline personality disorder in adult F60.3 Neuropsychological testing    Past Psychiatric History: See intake H&P for full details. Reviewed, with no updates at this time.   Past Medical History:  Past Medical History:  Diagnosis Date  . Anxiety   . Bipolar disorder (HCC)   . Depression   . PTSD (post-traumatic stress disorder)     Past Surgical History:  Procedure Laterality Date  . cholesterol    . WISDOM TOOTH EXTRACTION      Family Psychiatric History: See intake H&P for full details. Reviewed, with no updates at this time.   Family History:  Family History  Problem Relation Age of Onset  . Cancer Mother   . Cancer Father   . Depression Father     Social History:  Social History   Social History  . Marital status: Divorced    Spouse name: N/A  . Number of children: N/A  . Years of education: N/A   Social History Main Topics  . Smoking status: Current Every Day Smoker    Packs/day: 0.60    Years: 19.00    Types: Cigarettes  . Smokeless tobacco: Never Used     Comment: States trying to cut back until quits, now a 1/2 pack a day  . Alcohol use No  . Drug use: No     Comment: seldom  . Sexual activity: Yes  Partners: Female    Birth control/ protection: None   Other Topics Concern  . None   Social History Narrative  . None    Allergies:  Allergies  Allergen Reactions  . Mellaril [Thioridazine] Swelling    Bilateral leg swelling  . Testosterone     Other reaction(s): Anaphylaxis    Metabolic Disorder Labs: Lab Results  Component Value Date   HGBA1C 5.2 06/04/2016   MPG 103 06/04/2016   MPG 117 06/15/2014   Lab Results  Component Value Date   PROLACTIN 19.5 (H) 06/04/2016   Lab Results  Component Value Date   CHOL 313 (H) 06/04/2016   TRIG 299 (H) 06/04/2016   HDL 46 06/04/2016   CHOLHDL 6.8  06/04/2016   VLDL 60 (H) 06/04/2016   LDLCALC 207 (H) 06/04/2016   LDLCALC 132 (H) 01/29/2014     Current Medications: Current Outpatient Prescriptions  Medication Sig Dispense Refill  . clonazePAM (KLONOPIN) 1 MG tablet Take 1 tablet (1 mg total) by mouth 3 (three) times daily. For severe anxiety 90 tablet 2  . mirtazapine (REMERON) 30 MG tablet Take 1 tablet (30 mg total) by mouth at bedtime. For insomnia 90 tablet 0  . omeprazole (PRILOSEC) 20 MG capsule Take 1 capsule (20 mg total) by mouth daily. 30 capsule 1  . QUEtiapine (SEROQUEL) 100 MG tablet Take 1 tablet (100 mg total) by mouth 3 (three) times daily. 90 tablet 2  . QUEtiapine (SEROQUEL) 300 MG tablet Take 1 tablet (300 mg total) by mouth at bedtime. For mood control 30 tablet 2   No current facility-administered medications for this visit.     Neurologic: Headache: Negative Seizure: Negative Paresthesias: Negative  Musculoskeletal: Strength & Muscle Tone: within normal limits Gait & Station: normal Patient leans: N/A  Psychiatric Specialty Exam: ROS  Blood pressure (!) 144/82, pulse (!) 110, height 5\' 9"  (1.753 m), weight 191 lb (86.6 kg).Body mass index is 28.21 kg/m.  General Appearance: Casual and Fairly Groomed  Eye Contact:  Good  Speech:  Clear and Coherent  Volume:  Normal  Mood:  Dysphoric  Affect:  Congruent  Thought Process:  Goal Directed  Orientation:  Full (Time, Place, and Person)  Thought Content: Logical   Suicidal Thoughts:  No  Homicidal Thoughts:  No  [name]:  Immediate;   Fair  Judgement:  Fair  Insight:  Present and Shallow  Psychomotor Activity:  Normal  Concentration:  Concentration: Good  Recall:  Good  Fund of Knowledge: Good  Language: Good  Akathisia:  Negative  Handed:  Right  AIMS (if indicated):  0  Assets:  Community education officerCommunication Skills Housing Transportation  ADL's:  Intact  Cognition: WNL  Sleep:  7-9 hours    Treatment Plan Summary: Phillip [name] is a  46 year old male with borderline personality disorder and bipolar disorder, PTSD who presents today for medication management follow-up. He is very poorly participated in any recommended interventions including individual therapy, IOP, sleep study. He continues to seek stimulant medications, and I have recommended that we obtain neuropsych testing to clarify his needs. I continue to be concerned about drug-seeking.  I have recommended that we taper down on clonazepam gradually given his complaints of daytime sedation and cognitive dulling.  1. Sleep-disordered breathing   2. Chronic post-traumatic stress disorder (PTSD)   3. Major depressive disorder, recurrent, severe without psychotic features (HCC)   4. Borderline personality disorder in adult    Clonazepam 1 mg twice a day, and 0.5 mg every  1 PM; phoned in medication change to [facility] Continue Seroquel 300 mg nightly and 100 mg 3 times a day Continue Remeron 30 mg nightly Follow-up in 2 months Patient to schedule therapy, sleep study, neuropsych testing  Burnard Leigh, MD 09/28/2016, 11:38 AM

## 2016-09-28 NOTE — Telephone Encounter (Signed)
Okay, I called patient and let him know

## 2016-10-02 ENCOUNTER — Ambulatory Visit (HOSPITAL_COMMUNITY): Payer: Self-pay | Admitting: Psychiatry

## 2016-10-27 ENCOUNTER — Other Ambulatory Visit (HOSPITAL_COMMUNITY): Payer: Self-pay

## 2016-10-27 DIAGNOSIS — F319 Bipolar disorder, unspecified: Secondary | ICD-10-CM

## 2016-10-27 MED ORDER — QUETIAPINE FUMARATE 100 MG PO TABS
100.0000 mg | ORAL_TABLET | Freq: Three times a day (TID) | ORAL | 0 refills | Status: DC
Start: 2016-10-27 — End: 2016-11-23

## 2016-10-27 MED ORDER — QUETIAPINE FUMARATE 300 MG PO TABS
300.0000 mg | ORAL_TABLET | Freq: Every day | ORAL | 0 refills | Status: DC
Start: 2016-10-27 — End: 2016-11-29

## 2016-11-23 ENCOUNTER — Other Ambulatory Visit (HOSPITAL_COMMUNITY): Payer: Self-pay

## 2016-11-23 ENCOUNTER — Ambulatory Visit (INDEPENDENT_AMBULATORY_CARE_PROVIDER_SITE_OTHER): Payer: Medicare Other | Admitting: Licensed Clinical Social Worker

## 2016-11-23 ENCOUNTER — Encounter (HOSPITAL_COMMUNITY): Payer: Self-pay | Admitting: Licensed Clinical Social Worker

## 2016-11-23 DIAGNOSIS — F4310 Post-traumatic stress disorder, unspecified: Secondary | ICD-10-CM

## 2016-11-23 DIAGNOSIS — F603 Borderline personality disorder: Secondary | ICD-10-CM | POA: Diagnosis not present

## 2016-11-23 DIAGNOSIS — F319 Bipolar disorder, unspecified: Secondary | ICD-10-CM | POA: Diagnosis not present

## 2016-11-23 DIAGNOSIS — F332 Major depressive disorder, recurrent severe without psychotic features: Secondary | ICD-10-CM

## 2016-11-23 MED ORDER — CLONAZEPAM 1 MG PO TABS: ORAL_TABLET | ORAL | 0 refills | Status: DC

## 2016-11-23 MED ORDER — QUETIAPINE FUMARATE 100 MG PO TABS: 100.0000 mg | ORAL_TABLET | Freq: Three times a day (TID) | ORAL | 0 refills | Status: DC

## 2016-11-23 MED ORDER — MIRTAZAPINE 30 MG PO TABS: 30.0000 mg | ORAL_TABLET | Freq: Every day | ORAL | 0 refills | Status: DC
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
## 2016-11-29 ENCOUNTER — Ambulatory Visit (INDEPENDENT_AMBULATORY_CARE_PROVIDER_SITE_OTHER): Payer: Medicare Other | Admitting: Psychiatry

## 2016-11-29 DIAGNOSIS — F4310 Post-traumatic stress disorder, unspecified: Secondary | ICD-10-CM | POA: Diagnosis not present

## 2016-11-29 DIAGNOSIS — F319 Bipolar disorder, unspecified: Secondary | ICD-10-CM

## 2016-11-29 DIAGNOSIS — F603 Borderline personality disorder: Secondary | ICD-10-CM | POA: Diagnosis not present

## 2016-11-29 DIAGNOSIS — F332 Major depressive disorder, recurrent severe without psychotic features: Secondary | ICD-10-CM

## 2016-11-29 DIAGNOSIS — F17210 Nicotine dependence, cigarettes, uncomplicated: Secondary | ICD-10-CM

## 2016-11-29 DIAGNOSIS — Z818 Family history of other mental and behavioral disorders: Secondary | ICD-10-CM

## 2016-11-29 MED ORDER — CLONAZEPAM 1 MG PO TABS: ORAL_TABLET | ORAL | 2 refills | Status: DC

## 2016-11-29 MED ORDER — SUVOREXANT 10 MG PO TABS: 10.0000 mg | ORAL_TABLET | Freq: Every day | ORAL | 0 refills | Status: DC

## 2016-11-29 MED ORDER — MIRTAZAPINE 45 MG PO TABS: 45.0000 mg | ORAL_TABLET | Freq: Every day | ORAL | 1 refills | Status: DC

## 2016-11-29 MED ORDER — QUETIAPINE FUMARATE 100 MG PO TABS: 100.0000 mg | ORAL_TABLET | Freq: Three times a day (TID) | ORAL | 0 refills | Status: DC

## 2016-11-29 MED ORDER — QUETIAPINE FUMARATE 300 MG PO TABS: 300.0000 mg | ORAL_TABLET | Freq: Every day | ORAL | 0 refills | Status: DC

## 2016-11-29 NOTE — Progress Notes (Signed)
BH MD/PA/NP OP Progress Note  11/29/2016 1:35 PM Phillip MemorySteven Joseph Hubers  MRN:  [id number]  Chief Complaint:  Chief Complaint    Follow-up     HPI: Phillip [name] reports that he's been able to tolerate the decrease in clonazepam over the past few weeks, and reports that his anxiety has remained fairly steady. He had some rough days last week, but overall feels okay. He reports that his depression symptoms seem to be getting better with increase in Remeron, so we agreed to increase Remeron further to 45 mg. He reports that his sleep is tended to be hit or miss. He reports that he's tried Ambien in the past, and it works for a little while but then stops working. Spent time discussing the risks and benefits of belsomra, and agreed to try at 10 mg nightly. He denies any alcohol use, and understands that alcohol should not be used at all with his medications. He reports things are going well with him and his girlfriend, Phillip [name], and that things seem to be going well with his mood overall. He is going to start individual therapy in this clinic with a new provider. He denies any suicidality or safety issues. He agrees to follow-up in 3 months or sooner if needed.  I reminded him of my recommendation to please get his sleep study completed. He reports that his girlfriend tells him that he stops breathing in the middle the night for 20-30 seconds, and she is also concerned about sleep apnea for him.  Visit Diagnosis:    ICD-10-CM   1. Bipolar I disorder (HCC) F31.9 clonazePAM (KLONOPIN) 1 MG tablet    QUEtiapine (SEROQUEL) 300 MG tablet    QUEtiapine (SEROQUEL) 100 MG tablet    Suvorexant (BELSOMRA) 10 MG TABS  2. 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 Reviewed, with no updates at this time.   Past Medical History:  Past  Medical History:  Diagnosis Date  . Anxiety   . Bipolar disorder (HCC)   . Depression   . PTSD (post-traumatic stress disorder)     Past Surgical History:  Procedure Laterality Date  . cholesterol    . WISDOM TOOTH EXTRACTION      Family Psychiatric History: See intake H&P for full details. Reviewed, with no updates at this time.   Family History:  Family History  Problem Relation Age of Onset  . Cancer Mother   . Cancer Father   . Depression Father     Social History:  Social History   Social History  . Marital status: Divorced    Spouse name: N/A  . Number of children: N/A  . Years of education: N/A   Social History Main Topics  . Smoking status: Current Every Day Smoker    Packs/day: 0.60    Years: 19.00    Types: Cigarettes  . Smokeless tobacco: Never Used     Comment: States trying to cut back until quits, now a 1/2 pack a day  . Alcohol use No  . Drug use: No     Comment: seldom  . Sexual activity: Yes    Partners: Female    Birth control/ protection: None   Other Topics Concern  . Not on file   Social History Narrative  . No narrative on file    Allergies:  Allergies  Allergen Reactions  . Vicodin [Hydrocodone-Acetaminophen] Nausea And Vomiting  . Mellaril [Thioridazine] Swelling    Bilateral leg swelling  . Testosterone     Other reaction(s): Anaphylaxis    Metabolic Disorder Labs: Lab Results  Component Value Date   HGBA1C 5.2 06/04/2016   MPG 103 06/04/2016   MPG 117 06/15/2014   Lab Results  Component Value Date   PROLACTIN 19.5 (H) 06/04/2016   Lab Results  Component Value Date   CHOL 313 (H) 06/04/2016   TRIG 299 (H) 06/04/2016   HDL 46 06/04/2016   CHOLHDL 6.8 06/04/2016   VLDL 60 (H) 06/04/2016   LDLCALC 207 (H) 06/04/2016   LDLCALC 132 (H) 01/29/2014   Lab Results  Component Value Date   TSH 4.830 (H) 08/28/2016   TSH 4.220 06/04/2016    Therapeutic Level Labs: No results found for: LITHIUM No results found  for: VALPROATE No components found for:  CBMZ  Current Medications: Current Outpatient Prescriptions  Medication Sig Dispense Refill  . clonazePAM (KLONOPIN) 1 MG tablet 1 tablet qam, 1/2 tab in the afternoon (if needed) and 1 tablet qhs 75 tablet 2  . mirtazapine (REMERON) 45 MG tablet Take 1 tablet (45 mg total) by mouth at bedtime. For insomnia 90 tablet 1  . omeprazole (PRILOSEC) 20 MG capsule Take 1 capsule (20 mg total) by mouth daily. 30 capsule 1  . QUEtiapine (SEROQUEL) 100 MG tablet Take 1 tablet (100 mg total) by mouth 3 (three) times daily. 90 tablet 0  . QUEtiapine (SEROQUEL) 300 MG tablet Take 1 tablet (300 mg total) by mouth at bedtime. For mood control 30 tablet 0  . Suvorexant (BELSOMRA) 10 MG TABS Take 10 mg by mouth at bedtime. 90 tablet 0   No current facility-administered medications for this visit.      Musculoskeletal: Strength & Muscle Tone: within normal limits Gait & Station: normal Patient leans: N/A  Psychiatric Specialty Exam: ROS  Blood pressure 132/84, pulse 94, height 5' 9.25" (1.759 m), weight 199 lb (90.3 kg).Body mass index is 29.18 kg/m.  General Appearance: Casual and Fairly Groomed  Eye Contact:  Good  Speech:  Clear and Coherent  Volume:  Normal  Mood:  Euthymic  Affect:  Appropriate and Congruent  Thought Process:  Coherent and Goal Directed  Orientation:  Full (Time, Place, and Person)  Thought Content: Logical   Suicidal Thoughts:  No  Homicidal Thoughts:  No  Memory:  Immediate;   Fair  Judgement:  Fair  Insight:  Fair  Psychomotor Activity:  Normal  Concentration:  Attention Span: Fair  Recall:  [employer] of Knowledge: Fair  Language: Good  Akathisia:  Negative  Handed:  Right  AIMS (if indicated): not done  Assets:  Communication Skills Desire for Improvement Financial Resources/Insurance Housing Intimacy Transportation  ADL's:  Intact  Cognition: WNL  Sleep:  Fair   Screenings: AIMS     [facility]  AIMS Total Score  0  0    AUDIT     [facility]) [facility]Discharged[facility]Discharged) [facility]  Alcohol Use Disorder Identification Test Final Score (AUDIT)  3  1  0  1       Assessment and Plan: Phillip [name] is a 46 year old male with a history of chronic PTSD, bipolar disorder, and borderline personality disorder who presents today for med management follow-up. He has yet to schedule the sleep study as recommended, and only recently participated in individual therapy intake. He continues to be fairly help seeking and help avoidant. We continue to taper her clonazepam down given his sedation, and my suspicion that he has sleep apnea. Will proceed as below and follow up in 3 months, and make a further tapering of clonazepam at that time.  1. Bipolar I disorder (HCC)   2. Major depressive disorder, recurrent, severe without psychotic features (HCC)    Clonazepam 1 mg twice a day, and 0.5 mg at noon Seroquel 300 mg nightly and 100 mg 3 times a day Remeron increased to 45 mg nightly Return to clinic in 3 months Initiate Belsomra for insomnia; 10 mg, okay to increase to 20 mg as tolerated Reminded patient to please schedule individual therapy, sleep study and neuropsych testing  Burnard Leigh, MD 11/29/2016, 1:35 PM

## 2016-12-01 ENCOUNTER — Telehealth (HOSPITAL_COMMUNITY): Payer: Self-pay | Admitting: Psychiatry

## 2016-12-08 ENCOUNTER — Telehealth (HOSPITAL_COMMUNITY): Payer: Self-pay | Admitting: *Deleted

## 2016-12-08 NOTE — Telephone Encounter (Signed)
Prior authorization for Belsomra received. Submitted online with Cover my meds. Awaiting response.

## 2017-01-23 ENCOUNTER — Other Ambulatory Visit (HOSPITAL_COMMUNITY): Payer: Self-pay | Admitting: Psychiatry

## 2017-01-23 DIAGNOSIS — F319 Bipolar disorder, unspecified: Secondary | ICD-10-CM

## 2017-01-30 ENCOUNTER — Encounter (HOSPITAL_COMMUNITY): Payer: Self-pay | Admitting: Psychiatry

## 2017-01-30 NOTE — Progress Notes (Signed)
[name], male   DOB: 1970/09/30, 46 y.o.   MRN: [id number]  At our last visit, patient reports that he was going to be starting with a psychologist and having ADHD testing.  I received a faxed from [facility], reporting that the patient has not made contact to schedule first appointment for therapy and ADHD testing.  I have also ordered for polysomnography, which the patient has not followed up on and it does not appear to be scheduled for this assessment.

## 2017-02-28 ENCOUNTER — Encounter (HOSPITAL_COMMUNITY): Payer: Self-pay | Admitting: Psychiatry

## 2017-02-28 ENCOUNTER — Ambulatory Visit (INDEPENDENT_AMBULATORY_CARE_PROVIDER_SITE_OTHER): Payer: Medicare Other | Admitting: Psychiatry

## 2017-02-28 VITALS — BP 124/82 | HR 108 | Ht 70.0 in | Wt 211.0 lb

## 2017-02-28 DIAGNOSIS — F332 Major depressive disorder, recurrent severe without psychotic features: Secondary | ICD-10-CM | POA: Diagnosis not present

## 2017-02-28 DIAGNOSIS — Z818 Family history of other mental and behavioral disorders: Secondary | ICD-10-CM

## 2017-02-28 DIAGNOSIS — Z79899 Other long term (current) drug therapy: Secondary | ICD-10-CM | POA: Diagnosis not present

## 2017-02-28 DIAGNOSIS — F603 Borderline personality disorder: Secondary | ICD-10-CM

## 2017-02-28 DIAGNOSIS — F319 Bipolar disorder, unspecified: Secondary | ICD-10-CM | POA: Diagnosis not present

## 2017-02-28 DIAGNOSIS — F4001 Agoraphobia with panic disorder: Secondary | ICD-10-CM

## 2017-02-28 DIAGNOSIS — F17210 Nicotine dependence, cigarettes, uncomplicated: Secondary | ICD-10-CM

## 2017-02-28 DIAGNOSIS — F4310 Post-traumatic stress disorder, unspecified: Secondary | ICD-10-CM | POA: Diagnosis not present

## 2017-02-28 MED ORDER — TOPIRAMATE 50 MG PO TABS: 25.0000 mg | ORAL_TABLET | Freq: Every day | ORAL | 1 refills | Status: DC

## 2017-02-28 MED ORDER — QUETIAPINE FUMARATE 100 MG PO TABS: 100.0000 mg | ORAL_TABLET | Freq: Three times a day (TID) | ORAL | 5 refills | Status: DC

## 2017-02-28 MED ORDER — QUETIAPINE FUMARATE 300 MG PO TABS: 300.0000 mg | ORAL_TABLET | Freq: Every day | ORAL | 1 refills | Status: DC

## 2017-02-28 MED ORDER — SELEGILINE 6 MG/24HR TD PT24: 6.0000 mg | MEDICATED_PATCH | Freq: Every day | TRANSDERMAL | 12 refills | Status: DC

## 2017-02-28 MED ORDER — CLONAZEPAM 1 MG PO TABS: 1.0000 mg | ORAL_TABLET | Freq: Three times a day (TID) | ORAL | 3 refills | Status: DC

## 2017-02-28 NOTE — Progress Notes (Signed)
BH MD/PA/NP OP Progress Note  02/28/2017 2:38 PM Phillip [name]  MRN:  [id number]  Chief Complaint: depressed, dont care HPI: Patient continues to struggle with depression, poor motivation, low energy.  He presents as quite melancholic, feels like there is no help.  He denies any intention to harm himself, but would be fine if he just passed away in his sleep.  I offered psychiatric hospitalization, and he declines, does not feel that he is at a place where he would harm himself, and he agrees to seek psychiatric hospitalization if he was to progress to that point.  I spent time with him offering additional modalities of treatment including electroconvulsive therapy, lithium, and we discussed Emsam.  Given his ongoing melancholic depression, ongoing struggle with anxiety and panic, and relative mood stability on Seroquel given that he has not had any mania or hypomania, we agreed to initiate Emsam patch.  I reviewed the risks of MAOI E.  We agreed to continue clonazepam and Seroquel and follow-up in 10-12 weeks.  He is concerned about weight gain from Seroquel.  I suggested we start him on a low-dose of Topamax nightly to help with sleep and reduce weight gain and appetite related to atypical antipsychotic.  He has yet to schedule individual therapy, nor has he gone for sleep study, nor has he gone for neuropsych testing for ADHD.  I spent time with him discussing therapy and treatment interfering behaviors, and he acknowledges that he has been pretty avoidant with regard to taking care of himself.  We were able to schedule him for therapy follow-up/visit in this office with [facility] on January 2.  Visit Diagnosis:    ICD-10-CM   1. Borderline personality disorder in adult [facility]) F60.3   2. 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 Bipolar I disorder (HCC) F31.9 QUEtiapine (SEROQUEL)  300 MG tablet    QUEtiapine (SEROQUEL) 100 MG tablet    topiramate (TOPAMAX) 50 MG tablet    clonazePAM (KLONOPIN) 1 MG tablet  4. Panic disorder with agoraphobia F40.01   5. PTSD (post-traumatic stress disorder) F43.10     Past Psychiatric History: See intake H&P for full details. Reviewed, with no updates at this time.   Past Medical History:  Past Medical History:  Diagnosis Date  . Anxiety   . Bipolar disorder (HCC)   . Depression   . PTSD (post-traumatic stress disorder)     Past Surgical History:  Procedure Laterality Date  . cholesterol    . WISDOM TOOTH EXTRACTION      Family Psychiatric History: See intake H&P for full details. Reviewed, with no updates at this time.   Family History:  Family History  Problem Relation Age of Onset  . Cancer Mother   . Cancer Father   . Depression Father     Social History:  Social History   Socioeconomic History  . Marital status: Divorced    Spouse name: None  . Number of children: None  . Years of education: None  . Highest education level: None  Social Needs  . Financial resource strain: None  . Food insecurity - worry: None  . Food insecurity - inability: None  . Transportation needs - medical: None  . Transportation needs - non-medical: None  Occupational History  . None  Tobacco Use  . Smoking status: Current Every Day Smoker    Packs/day: 0.60  Years: 19.00    Pack years: 11.40    Types: Cigarettes  . Smokeless tobacco: Never Used  . Tobacco comment: Says has cut back and down to a pack a week  Substance and Sexual Activity  . Alcohol use: No  . Drug use: No    Comment: seldom  . Sexual activity: Yes    Partners: Female    Birth control/protection: None  Other Topics Concern  . None  Social History Narrative  . None    Allergies:  Allergies  Allergen Reactions  . Vicodin [Hydrocodone-Acetaminophen] Nausea And Vomiting  . Mellaril [Thioridazine] Swelling    Bilateral leg swelling  .  Testosterone     Other reaction(s): Anaphylaxis    Metabolic Disorder Labs: Lab Results  Component Value Date   HGBA1C 5.2 06/04/2016   MPG 103 06/04/2016   MPG 117 06/15/2014   Lab Results  Component Value Date   PROLACTIN 19.5 (H) 06/04/2016   Lab Results  Component Value Date   CHOL 313 (H) 06/04/2016   TRIG 299 (H) 06/04/2016   HDL 46 06/04/2016   CHOLHDL 6.8 06/04/2016   VLDL 60 (H) 06/04/2016   LDLCALC 207 (H) 06/04/2016   LDLCALC 132 (H) 01/29/2014   Lab Results  Component Value Date   TSH 4.830 (H) 08/28/2016   TSH 4.220 06/04/2016    Therapeutic Level Labs: No results found for: LITHIUM No results found for: VALPROATE No components found for:  CBMZ  Current Medications: Current Outpatient Medications  Medication Sig Dispense Refill  . clonazePAM (KLONOPIN) 1 MG tablet Take 1 tablet (1 mg total) by mouth 3 (three) times daily. 90 tablet 3  . omeprazole (PRILOSEC) 20 MG capsule Take 1 capsule (20 mg total) by mouth daily. 30 capsule 1  . QUEtiapine (SEROQUEL) 100 MG tablet Take 1 tablet (100 mg total) by mouth 3 (three) times daily. 90 tablet 5  . QUEtiapine (SEROQUEL) 300 MG tablet Take 1 tablet (300 mg total) by mouth at bedtime. 90 tablet 1  . selegiline (EMSAM) 6 MG/24HR Place 1 patch (6 mg total) onto the skin daily. 30 patch 12  . topiramate (TOPAMAX) 50 MG tablet Take 0.5 tablets (25 mg total) by mouth at bedtime. Take 1/2 tablet for 1 week, then increase to 1 whole tablet 90 tablet 1   No current facility-administered medications for this visit.      Musculoskeletal: Strength & Muscle Tone: within normal limits Gait & Station: normal Patient leans: N/A  Psychiatric Specialty Exam: ROS  Blood pressure 124/82, pulse (!) 108, height 5\' 10"  (1.778 m), weight 211 lb (95.7 kg).Body mass index is 30.28 kg/m.  General Appearance: Casual and Fairly Groomed  Eye Contact:  Fair  Speech:  Clear and Coherent  Volume:  Normal  Mood:  Anxious, Depressed  and Dysphoric  Affect:  Appropriate and Congruent  Thought Process:  Goal Directed and Descriptions of Associations: Intact  Orientation:  Full (Time, Place, and Person)  Thought Content: Logical   Suicidal Thoughts:  Yes.  without intent/plan  Homicidal Thoughts:  No  Memory:  Immediate;   Fair  Judgement:  Fair  Insight:  Shallow  Psychomotor Activity:  Normal  Concentration:  Concentration: Fair  Recall:  [employer]  Fund of Knowledge: Fair  Language: Good  Akathisia:  Negative  Handed:  Right  AIMS (if indicated): done  Assets:  Communication Skills Desire for Improvement Financial Resources/Insurance Housing Leisure Time  ADL's:  Intact  Cognition: WNL  Sleep:  Good  Screenings: AIMS     [facility]  AIMS Total Score  0  0    AUDIT     [facility]) [facility]Discharged) [facility]Discharged) [facility]  Alcohol Use Disorder Identification Test Final Score (AUDIT)  3  1  0  1       Assessment and Plan:  Phillip [name] continues to present with a married of complaints, including high anxiety, low energy, sleeping too much during the day, not sleeping enough at night, feeling depressed and hopeless, and also feeling frustrated.  He continues to be primarily focused on clonazepam as the only thing that helped.  I spent time with him today discussing additional treatment options for major depressive disorder and bipolar disorder, in conjunction with his comorbid PTSD and anxiety disorders.  We ultimately  agreed to start Emsam patch and discontinue Remeron.  He denies any acute intention to harm himself, but if his mood continues to deteriorate, I would recommend psychiatric hospitalization.  We will follow-up in 8-12 weeks or sooner if needed.  He has agreed to establish individual therapy in this office and [facility].   1. Borderline personality disorder in adult [facility](HCC)   2. Major depressive disorder, recurrent, severe without psychotic features (HCC)   3. Bipolar I disorder (HCC)   4. Panic disorder with agoraphobia   5. PTSD (post-traumatic stress disorder)     Status of current problems: unchanged  Labs Ordered: No orders of the defined types were placed in this encounter.   Labs Reviewed: n/a  Collateral Obtained/Records Reviewed: n/a  Plan:  Initiate Emsam 6 mg per 24-hour patch Continue Seroquel 300 mg nightly and 100 mg 3 times daily Continue clonazepam 1 mg 3 times daily (increased) Discontinue Remeron Discontinue Belsomra given insurance denial Initiate Topamax for sleep and weight gain related to antipsychotic; reviewed the risks of Stevens-Johnson syndrome and educated patient on reasons to visit the emergency department  I spent 30 minutes with the patient in direct face-to-face clinical care.  Greater than 50% of this time was spent in counseling and coordination of care with the patient.    Burnard LeighAlexander Arya Eksir, MD 02/28/2017, 2:38 PM

## 2017-03-28 ENCOUNTER — Ambulatory Visit (HOSPITAL_COMMUNITY): Payer: Self-pay | Admitting: Licensed Clinical Social Worker

## 2017-05-23 ENCOUNTER — Encounter (HOSPITAL_COMMUNITY): Payer: Self-pay | Admitting: Psychiatry

## 2017-05-23 ENCOUNTER — Ambulatory Visit (INDEPENDENT_AMBULATORY_CARE_PROVIDER_SITE_OTHER): Payer: Medicare Other | Admitting: Psychiatry

## 2017-05-23 VITALS — BP 142/88 | HR 90 | Ht 70.0 in | Wt 214.2 lb

## 2017-05-23 DIAGNOSIS — F332 Major depressive disorder, recurrent severe without psychotic features: Secondary | ICD-10-CM | POA: Diagnosis not present

## 2017-05-23 DIAGNOSIS — F319 Bipolar disorder, unspecified: Secondary | ICD-10-CM

## 2017-05-23 DIAGNOSIS — Z818 Family history of other mental and behavioral disorders: Secondary | ICD-10-CM

## 2017-05-23 DIAGNOSIS — F4310 Post-traumatic stress disorder, unspecified: Secondary | ICD-10-CM | POA: Diagnosis not present

## 2017-05-23 DIAGNOSIS — F4001 Agoraphobia with panic disorder: Secondary | ICD-10-CM

## 2017-05-23 DIAGNOSIS — F17210 Nicotine dependence, cigarettes, uncomplicated: Secondary | ICD-10-CM | POA: Diagnosis not present

## 2017-05-23 MED ORDER — CLONAZEPAM 1 MG PO TABS: 1.0000 mg | ORAL_TABLET | Freq: Three times a day (TID) | ORAL | 3 refills | Status: DC

## 2017-05-23 MED ORDER — QUETIAPINE FUMARATE 300 MG PO TABS: 300.0000 mg | ORAL_TABLET | Freq: Every day | ORAL | 1 refills | Status: DC

## 2017-05-23 MED ORDER — METFORMIN HCL ER 500 MG PO TB24: 500.0000 mg | ORAL_TABLET | Freq: Every day | ORAL | 1 refills | Status: DC

## 2017-05-23 MED ORDER — QUETIAPINE FUMARATE 100 MG PO TABS: 100.0000 mg | ORAL_TABLET | ORAL | 0 refills | Status: DC

## 2017-05-23 NOTE — Progress Notes (Signed)
BH MD/PA/NP OP Progress Note  05/23/2017 3:38 PM Phillip [name]  MRN:  [id number]  Chief Complaint: gaining weight   HPI: Phillip [name] reports that his mood has been stable, but he has not been taking Topamax, Emsam, or Belsomra.  He reports that he tried both of the medicines for about 10-12 days, and felt more irritable on the Emsam.  He reports that the Seroquel seems to be the only thing that agrees with him, but he would like to go down on the dose because of substantial weight gain.  He has gained around 40 pounds over the past year.  He was agreeable to decrease Seroquel to a maintenance dose of 400 mg daily, and also initiate metformin for evidence based assistance in weight loss.  He reports that his anxiety is stable with clonazepam 1 mg 3 times daily, we agreed to maintain at the current dose.  We will follow-up in 8-10 weeks or sooner if needed.  He denies any safety concerns and reports that he has moved into a rental home on 100 acres of land and its much more peaceful for him and he has privacy, he likes his new situation.  Visit Diagnosis:    ICD-10-CM   1. 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 Bipolar I disorder (HCC) F31.9 QUEtiapine (SEROQUEL) 100 MG tablet    QUEtiapine (SEROQUEL) 300 MG tablet    clonazePAM (KLONOPIN) 1 MG tablet    metFORMIN (GLUCOPHAGE XR) 500 MG 24 hr tablet  3. PTSD (post-traumatic stress disorder) F43.10   4. Panic disorder with agoraphobia F40.01     Past Psychiatric History: See intake H&P for full details. Reviewed, with no updates at this time.   Past Medical History:  Past Medical History:  Diagnosis Date  . Anxiety   . Bipolar disorder (HCC)   . Depression   . PTSD (post-traumatic stress disorder)     Past Surgical History:  Procedure Laterality Date  . cholesterol    . WISDOM TOOTH EXTRACTION      Family Psychiatric History: See intake H&P for full details. Reviewed,  with no updates at this time.   Family History:  Family History  Problem Relation Age of Onset  . Cancer Mother   . Cancer Father   . Depression Father     Social History:  Social History   Socioeconomic History  . Marital status: Divorced    Spouse name: None  . Number of children: None  . Years of education: None  . Highest education level: None  Social Needs  . Financial resource strain: None  . Food insecurity - worry: None  . Food insecurity - inability: None  . Transportation needs - medical: None  . Transportation needs - non-medical: None  Occupational History  . None  Tobacco Use  . Smoking status: Current Every Day Smoker    Packs/day: 0.60    Years: 19.00    Pack years: 11.40    Types: Cigarettes  . Smokeless tobacco: Never Used  . Tobacco comment: Says has cut back and down to a pack a week  Substance and Sexual Activity  . Alcohol use: No  . Drug use: No    Comment: seldom  . Sexual activity: Yes    Partners: Female    Birth control/protection: None  Other Topics Concern  . None  Social History Narrative  . None    Allergies:  Allergies  Allergen Reactions  .  Vicodin [Hydrocodone-Acetaminophen] Nausea And Vomiting  . Mellaril [Thioridazine] Swelling    Bilateral leg swelling  . Testosterone     Other reaction(s): Anaphylaxis    Metabolic Disorder Labs: Lab Results  Component Value Date   HGBA1C 5.2 06/04/2016   MPG 103 06/04/2016   MPG 117 06/15/2014   Lab Results  Component Value Date   PROLACTIN 19.5 (H) 06/04/2016   Lab Results  Component Value Date   CHOL 313 (H) 06/04/2016   TRIG 299 (H) 06/04/2016   HDL 46 06/04/2016   CHOLHDL 6.8 06/04/2016   VLDL 60 (H) 06/04/2016   LDLCALC 207 (H) 06/04/2016   LDLCALC 132 (H) 01/29/2014   Lab Results  Component Value Date   TSH 4.830 (H) 08/28/2016   TSH 4.220 06/04/2016    Therapeutic Level Labs: No results found for: LITHIUM No results found for: VALPROATE No components  found for:  CBMZ  Current Medications: Current Outpatient Medications  Medication Sig Dispense Refill  . clonazePAM (KLONOPIN) 1 MG tablet Take 1 tablet (1 mg total) by mouth 3 (three) times daily. 90 tablet 3  . omeprazole (PRILOSEC) 20 MG capsule Take 1 capsule (20 mg total) by mouth daily. 30 capsule 1  . QUEtiapine (SEROQUEL) 100 MG tablet Take 1 tablet (100 mg total) by mouth every morning. 90 tablet 0  . QUEtiapine (SEROQUEL) 300 MG tablet Take 1 tablet (300 mg total) by mouth at bedtime. 90 tablet 1  . metFORMIN (GLUCOPHAGE XR) 500 MG 24 hr tablet Take 1 tablet (500 mg total) by mouth daily with breakfast. Increase to twice a day in 2 weeks as tolerated 180 tablet 1   No current facility-administered medications for this visit.      Musculoskeletal: Strength & Muscle Tone: within normal limits Gait & Station: normal Patient leans: N/A  Psychiatric Specialty Exam: ROS  Blood pressure (!) 142/88, pulse 90, height 5\' 10"  (1.778 m), weight 214 lb 3.2 oz (97.2 kg).Body mass index is 30.73 kg/m.  General Appearance: Casual, Fairly Groomed and Well Groomed  Eye Contact:  Good  Speech:  Clear and Coherent and Normal Rate  Volume:  Normal  Mood:  Dysphoric and Euthymic  Affect:  Congruent  Thought Process:  Coherent and Descriptions of Associations: Intact  Orientation:  Full (Time, Place, and Person)  Thought Content: Logical   Suicidal Thoughts:  No  Homicidal Thoughts:  No  Memory:  Immediate;   Fair  Judgement:  Fair  Insight:  Fair  Psychomotor Activity:  Normal  Concentration:  Attention Span: Fair  Recall:  Good  Fund of Knowledge: Good  Language: Good  Akathisia:  Negative  Handed:  Right  AIMS (if indicated): not done  Assets:  Communication Skills Desire for Improvement Financial Resources/Insurance Housing Intimacy  ADL's:  Intact  Cognition: WNL  Sleep:  Good   Screenings: AIMS     [facility]  AIMS Total Score  0  0    AUDIT     [facility]) [facility]) [facility]  Alcohol Use Disorder Identification Test Final Score (AUDIT)  3  1  0  1  Assessment and Plan:  Phillip [name] presents with relative mood stability, his primary concern is related to weight gain secondary to antipsychotics.  He has had substantial difficulties in being able to participate in individual therapy, so he is working on self help books and finds this to be more tolerable and useful for him.  We agreed to proceed as below to address some of the metabolic concerns, and we will follow-up in 8-10 weeks.  1. Major depressive disorder, recurrent, severe without psychotic features (HCC)   2. Bipolar I disorder (HCC)   3. PTSD (post-traumatic stress disorder)   4. Panic disorder with agoraphobia     Status of current problems: stable  Labs Ordered: No orders of the defined types were placed in this encounter.   Labs Reviewed: N/A  Collateral Obtained/Records Reviewed: N/A  Plan:  Seroquel 300 mg nightly, and 100 mg in the morning Clonazepam 1 mg 3 times daily Initiate metformin 500 mg XR twice a day  I spent 20 minutes with the patient in direct face-to-face clinical care.  Greater than 50% of this time was spent in counseling and coordination of care with the patient.    Burnard Leigh, MD 05/23/2017, 3:38 PM

## 2017-06-11 ENCOUNTER — Other Ambulatory Visit (HOSPITAL_COMMUNITY): Payer: Self-pay | Admitting: Psychiatry

## 2017-06-11 DIAGNOSIS — F319 Bipolar disorder, unspecified: Secondary | ICD-10-CM

## 2017-07-18 ENCOUNTER — Other Ambulatory Visit (HOSPITAL_COMMUNITY): Payer: Self-pay

## 2017-07-18 DIAGNOSIS — F319 Bipolar disorder, unspecified: Secondary | ICD-10-CM

## 2017-07-25 ENCOUNTER — Ambulatory Visit (HOSPITAL_COMMUNITY): Payer: Medicare Other | Admitting: Psychiatry

## 2017-07-26 ENCOUNTER — Encounter (HOSPITAL_COMMUNITY): Payer: Self-pay | Admitting: Psychiatry

## 2017-07-26 ENCOUNTER — Ambulatory Visit (INDEPENDENT_AMBULATORY_CARE_PROVIDER_SITE_OTHER): Payer: Medicare Other | Admitting: Psychiatry

## 2017-07-26 VITALS — BP 124/76 | HR 73 | Ht 70.0 in | Wt 214.0 lb

## 2017-07-26 DIAGNOSIS — F319 Bipolar disorder, unspecified: Secondary | ICD-10-CM

## 2017-07-26 DIAGNOSIS — R635 Abnormal weight gain: Secondary | ICD-10-CM

## 2017-07-26 DIAGNOSIS — Z6830 Body mass index (BMI) 30.0-30.9, adult: Secondary | ICD-10-CM

## 2017-07-26 DIAGNOSIS — F4310 Post-traumatic stress disorder, unspecified: Secondary | ICD-10-CM

## 2017-07-26 DIAGNOSIS — Z79899 Other long term (current) drug therapy: Secondary | ICD-10-CM | POA: Diagnosis not present

## 2017-07-26 DIAGNOSIS — F603 Borderline personality disorder: Secondary | ICD-10-CM | POA: Diagnosis not present

## 2017-07-26 DIAGNOSIS — Z818 Family history of other mental and behavioral disorders: Secondary | ICD-10-CM

## 2017-07-26 DIAGNOSIS — F17210 Nicotine dependence, cigarettes, uncomplicated: Secondary | ICD-10-CM | POA: Diagnosis not present

## 2017-07-26 DIAGNOSIS — F4001 Agoraphobia with panic disorder: Secondary | ICD-10-CM | POA: Diagnosis not present

## 2017-07-26 MED ORDER — QUETIAPINE FUMARATE 100 MG PO TABS: 100.0000 mg | ORAL_TABLET | Freq: Three times a day (TID) | ORAL | 1 refills | Status: DC

## 2017-07-26 MED ORDER — CLONAZEPAM 1 MG PO TABS: 1.0000 mg | ORAL_TABLET | Freq: Three times a day (TID) | ORAL | 3 refills | Status: DC

## 2017-07-26 NOTE — Progress Notes (Signed)
BH MD/PA/NP OP Progress Note  07/26/2017 12:30 PM Phillip [name]  MRN:  [id number]  Chief Complaint: medication management  HPI: Phillip [name] continues to struggle with weight gain, I suggest that he coordinate with his primary care physician for weight loss strategies.  He never started the metformin which had been recommended, because he was worried about side effects.  Denies any acute safety issues.  Does feel like his mood has been a bit more depressed with the lower dose of Seroquel, so we agreed to restart the 100 mg 3 times daily along with 300 mg nightly.  He continues on clonazepam 1 mg 3 times daily with good effect.  Reports that he is sleeping well.  Shared with patient that I am transitioning out of office at the end of August.  He expressed some grief.  We agreed to follow-up in 10 weeks and a transfer of care visit to Dr. Lolly Mustache after that.  Notably, he made a comment about asking if this Clinical research associate owns a Teaching laboratory technician.  I redirected him and mentioned that they are pretty cool cars, why does he ask.  He mentions that he saw 2 of them out in the physician's parking lot which is behind the building, and is not in a location where patients would park.    Visit Diagnosis:    ICD-10-CM   1. Borderline personality disorder in adult [facility]) F60.3   2. Bipolar I disorder (HCC) F31.9 QUEtiapine (SEROQUEL) 100 MG tablet    clonazePAM (KLONOPIN) 1 MG tablet  3. Panic disorder with agoraphobia F40.01   4. PTSD (post-traumatic stress disorder) F43.10     Past Psychiatric History: See intake H&P for full details. Reviewed, with no updates at this time.  Past Medical History:  Past Medical History:  Diagnosis Date  . Anxiety   . Bipolar disorder (HCC)   . Depression   . PTSD (post-traumatic stress disorder)     Past Surgical History:  Procedure Laterality Date  . cholesterol    . WISDOM TOOTH EXTRACTION      Family Psychiatric History: See intake H&P for full details.  Reviewed, with no updates at this time.   Family History:  Family History  Problem Relation Age of Onset  . Cancer Mother   . Cancer Father   . Depression Father     Social History:  Social History   Socioeconomic History  . Marital status: Divorced    Spouse name: Not on file  . Number of children: Not on file  . Years of education: Not on file  . Highest education level: Not on file  Occupational History  . Not on file  Social Needs  . Financial resource strain: Not on file  . Food insecurity:    Worry: Not on file    Inability: Not on file  . Transportation needs:    Medical: Not on file    Non-medical: Not on file  Tobacco Use  . Smoking status: Current Every Day Smoker    Packs/day: 0.60    Years: 19.00    Pack years: 11.40    Types: Cigarettes  . Smokeless tobacco: Never Used  . Tobacco comment: Has cut back to 4 a day and trying to stop  Substance and Sexual Activity  . Alcohol use: No  . Drug use: No    Types: Marijuana    Comment: seldom  . Sexual activity: Yes    Partners: Female    Birth control/protection: None  Lifestyle  . Physical activity:    Days per week: Not on file    Minutes per session: Not on file  . Stress: Not on file  Relationships  . Social connections:    Talks on phone: Not on file    Gets together: Not on file    Attends religious service: Not on file    Active member of club or organization: Not on file    Attends meetings of clubs or organizations: Not on file    Relationship status: Not on file  Other Topics Concern  . Not on file  Social History Narrative  . Not on file    Allergies:  Allergies  Allergen Reactions  . Vicodin [Hydrocodone-Acetaminophen] Nausea And Vomiting  . Mellaril [Thioridazine] Swelling    Bilateral leg swelling  . Testosterone     Other reaction(s): Anaphylaxis    Metabolic Disorder Labs: Lab Results  Component Value Date   HGBA1C 5.2 06/04/2016   MPG 103 06/04/2016   MPG 117  06/15/2014   Lab Results  Component Value Date   PROLACTIN 19.5 (H) 06/04/2016   Lab Results  Component Value Date   CHOL 313 (H) 06/04/2016   TRIG 299 (H) 06/04/2016   HDL 46 06/04/2016   CHOLHDL 6.8 06/04/2016   VLDL 60 (H) 06/04/2016   LDLCALC 207 (H) 06/04/2016   LDLCALC 132 (H) 01/29/2014   Lab Results  Component Value Date   TSH 4.830 (H) 08/28/2016   TSH 4.220 06/04/2016    Therapeutic Level Labs: No results found for: LITHIUM No results found for: VALPROATE No components found for:  CBMZ  Current Medications: Current Outpatient Medications  Medication Sig Dispense Refill  . [START ON 08/16/2017] clonazePAM (KLONOPIN) 1 MG tablet Take 1 tablet (1 mg total) by mouth 3 (three) times daily. 90 tablet 3  . omeprazole (PRILOSEC) 20 MG capsule Take 1 capsule (20 mg total) by mouth daily. 30 capsule 1  . QUEtiapine (SEROQUEL) 100 MG tablet Take 1 tablet (100 mg total) by mouth 3 (three) times daily. 270 tablet 1  . QUEtiapine (SEROQUEL) 300 MG tablet Take 1 tablet (300 mg total) by mouth at bedtime. 90 tablet 1   No current facility-administered medications for this visit.      Musculoskeletal: Strength & Muscle Tone: within normal limits Gait & Station: normal Patient leans: N/A  Psychiatric Specialty Exam: ROS  Blood pressure 124/76, pulse 73, height  (1.778 m), weight 214 lb (97.1 kg), SpO2 95 %.Body mass index is 30.71 kg/m.  General Appearance: Casual and Well Groomed  Eye Contact:  Good  Speech:  Clear and Coherent and Normal Rate  Volume:  Normal  Mood:  Depressed  Affect:  Congruent  Thought Process:  Coherent, Goal Directed and Descriptions of Associations: Intact  Orientation:  Full (Time, Place, and Person)  Thought Content: Logical   Suicidal Thoughts:  No  Homicidal Thoughts:  No  Memory:  Immediate;   Fair  Judgement:  Fair  Insight:  Fair  Psychomotor Activity:  Normal  Concentration:  Concentration: Fair  Recall:  [employer] of  Knowledge: Fair  Language: Fair  Akathisia:  Negative  Handed:  Right  AIMS (if indicated): not done  Assets:  Communication Skills Desire for Improvement Financial Resources/Insurance Housing Intimacy Transportation  ADL's:  Intact  Cognition: WNL  Sleep:  Good   Screenings: AIMS     [facility] (  Dis[facility]  AIMS Total Score  0  0    AUDIT     [facility]) [facility]Discharged[facility]Discharged) [facility]  Alcohol Use Disorder Identification Test Final Score (AUDIT)  3  1  0  1       Assessment and Plan:  Phillip [name] presents for medication management.  He does seem a bit more anxious as compared to the higher dose of Seroquel, so we agreed to restart as below.  He continues to be concerned about weight gain, yet has not participated in recommended treatments including initiation of metformin, and he only briefly tried Topamax before complaining that there was side effects.  I continue to be concerned about some of his behaviors being particularly gamy, as illustrated by his comments above regarding curiosity and questioning about what this writer's vehicle or car is.  I continue to be concerned that he has medication seeking behaviors, particularly stimulant seeking.  I suggested that he coordinate with his primary care provider for a weight loss medication.  If his primary care provider chooses to put him on phentermine, I educated the patient that this is a short-term medication that is used for only 12 weeks at a  time and needs to be done in the context of a weight loss provider.  He does not present with any acute safety issues or suicidality.  He understands that Clinical research associate is leaving clinic and we will have one further follow-up visit before he transferred his care to Dr. Lolly Mustache.  1. Borderline personality disorder in adult [facility])   2. Bipolar I disorder (HCC)   3. Panic disorder with agoraphobia   4. PTSD (post-traumatic stress disorder)     Status of current problems: unchanged  Labs Ordered: No orders of the defined types were placed in this encounter.   Labs Reviewed: n/a  Collateral Obtained/Records Reviewed: n/a  Plan:  Continue clonazepam 1 mg 3 times daily Continue Seroquel 300 mg nightly and 100 mg 3 times daily rtc 10 weeks Encourage patient to reconsider metformin, but he is opposed to this, and never started the medication  I spent 25 minutes with the patient in direct face-to-face clinical care.  Greater than 50% of this time was spent in counseling and coordination of care with the patient.    Burnard Leigh, MD 07/26/2017, 12:30 PM

## 2017-07-26 NOTE — Patient Instructions (Signed)
I would suggest consider referral to a weight loss clinic, or possibly consider weight loss medications as your discretion.

## 2017-09-17 ENCOUNTER — Encounter (HOSPITAL_COMMUNITY): Payer: Self-pay | Admitting: Psychiatry

## 2017-09-18 ENCOUNTER — Encounter (HOSPITAL_COMMUNITY): Payer: Self-pay | Admitting: Psychiatry

## 2017-09-18 ENCOUNTER — Ambulatory Visit (INDEPENDENT_AMBULATORY_CARE_PROVIDER_SITE_OTHER): Payer: Medicare Other | Admitting: Psychiatry

## 2017-09-18 VITALS — BP 128/76 | HR 80 | Ht 70.5 in | Wt 197.0 lb

## 2017-09-18 DIAGNOSIS — F603 Borderline personality disorder: Secondary | ICD-10-CM | POA: Diagnosis not present

## 2017-09-18 DIAGNOSIS — F319 Bipolar disorder, unspecified: Secondary | ICD-10-CM

## 2017-09-18 DIAGNOSIS — F17210 Nicotine dependence, cigarettes, uncomplicated: Secondary | ICD-10-CM

## 2017-09-18 DIAGNOSIS — F4310 Post-traumatic stress disorder, unspecified: Secondary | ICD-10-CM
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
## 2017-10-08 ENCOUNTER — Ambulatory Visit (HOSPITAL_COMMUNITY): Payer: Self-pay | Admitting: Psychiatry

## 2017-10-18 ENCOUNTER — Ambulatory Visit (HOSPITAL_COMMUNITY): Payer: Self-pay | Admitting: Psychiatry

## 2017-10-23 DIAGNOSIS — S0990XA Unspecified injury of head, initial encounter: Secondary | ICD-10-CM | POA: Diagnosis not present

## 2017-10-23 DIAGNOSIS — M79642 Pain in left hand: Secondary | ICD-10-CM | POA: Diagnosis not present

## 2017-10-23 DIAGNOSIS — R0781 Pleurodynia: Secondary | ICD-10-CM | POA: Diagnosis not present

## 2017-10-23 DIAGNOSIS — R51 Headache: Secondary | ICD-10-CM | POA: Diagnosis not present

## 2017-10-23 DIAGNOSIS — S60222A Contusion of left hand, initial encounter: Secondary | ICD-10-CM | POA: Diagnosis not present

## 2017-10-23 DIAGNOSIS — T7411XA Adult physical abuse, confirmed, initial encounter: Secondary | ICD-10-CM | POA: Diagnosis not present

## 2017-10-23 DIAGNOSIS — S6992XA Unspecified injury of left wrist, hand and finger(s), initial encounter: Secondary | ICD-10-CM | POA: Diagnosis not present

## 2017-10-23 DIAGNOSIS — S20311A Abrasion of right front wall of thorax, initial encounter: Secondary | ICD-10-CM | POA: Diagnosis not present

## 2017-10-23 DIAGNOSIS — R0789 Other chest pain: Secondary | ICD-10-CM | POA: Diagnosis not present

## 2017-10-23 DIAGNOSIS — S299XXA Unspecified injury of thorax, initial encounter: Secondary | ICD-10-CM | POA: Diagnosis not present

## 2017-10-23 DIAGNOSIS — M7989 Other specified soft tissue disorders: Secondary | ICD-10-CM | POA: Diagnosis not present

## 2017-10-25 ENCOUNTER — Other Ambulatory Visit (HOSPITAL_COMMUNITY): Payer: Self-pay

## 2017-10-25 ENCOUNTER — Encounter (HOSPITAL_COMMUNITY): Payer: Self-pay | Admitting: Psychiatry

## 2017-10-25 DIAGNOSIS — F332 Major depressive disorder, recurrent severe without psychotic features: Secondary | ICD-10-CM

## 2017-10-25 MED ORDER — MIRTAZAPINE 15 MG PO TABS: 15.0000 mg | ORAL_TABLET | Freq: Every day | ORAL | 0 refills | Status: DC

## 2017-10-25 NOTE — Telephone Encounter (Signed)
Hey can you reach out to him and see whats up? Thank you!

## 2017-10-29 ENCOUNTER — Encounter (HOSPITAL_COMMUNITY): Payer: Self-pay | Admitting: Psychiatry

## 2017-10-29 ENCOUNTER — Ambulatory Visit (INDEPENDENT_AMBULATORY_CARE_PROVIDER_SITE_OTHER): Payer: Medicare Other | Admitting: Psychiatry

## 2017-10-29 VITALS — BP 126/80 | HR 88 | Ht 70.5 in | Wt 193.0 lb

## 2017-10-29 DIAGNOSIS — F603 Borderline personality disorder: Secondary | ICD-10-CM | POA: Diagnosis not present

## 2017-10-29 DIAGNOSIS — F4310 Post-traumatic stress disorder, unspecified: Secondary | ICD-10-CM

## 2017-10-29 DIAGNOSIS — K219 Gastro-esophageal reflux disease without esophagitis: Secondary | ICD-10-CM | POA: Diagnosis not present

## 2017-10-29 DIAGNOSIS — F332 Major depressive disorder, recurrent severe without psychotic features: Secondary | ICD-10-CM | POA: Diagnosis not present

## 2017-10-29 MED ORDER — ZOLPIDEM TARTRATE 10 MG PO TABS: 10.0000 mg | ORAL_TABLET | Freq: Every day | ORAL | 1 refills | Status: DC

## 2017-10-29 MED ORDER — OMEPRAZOLE 20 MG PO CPDR: 20.0000 mg | DELAYED_RELEASE_CAPSULE | Freq: Two times a day (BID) | ORAL | 0 refills | Status: DC
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
## 2017-11-19 ENCOUNTER — Ambulatory Visit (HOSPITAL_COMMUNITY): Payer: Self-pay | Admitting: Psychiatry

## 2017-11-23 ENCOUNTER — Encounter (HOSPITAL_COMMUNITY): Payer: Self-pay | Admitting: *Deleted

## 2017-11-23 ENCOUNTER — Emergency Department (HOSPITAL_COMMUNITY)
Admission: EM | Admit: 2017-11-23 | Discharge: 2017-11-23 | Disposition: A | Discharge status: Home / Self Care | Payer: Medicare Other | Attending: Emergency Medicine | Admitting: Emergency Medicine

## 2017-11-23 ENCOUNTER — Emergency Department (HOSPITAL_COMMUNITY): Payer: Medicare Other

## 2017-11-23 DIAGNOSIS — R4182 Altered mental status, unspecified: Secondary | ICD-10-CM | POA: Diagnosis not present

## 2017-11-23 DIAGNOSIS — F17210 Nicotine dependence, cigarettes, uncomplicated: Secondary | ICD-10-CM | POA: Diagnosis not present

## 2017-11-23 DIAGNOSIS — I10 Essential (primary) hypertension: Secondary | ICD-10-CM | POA: Diagnosis not present

## 2017-11-23 DIAGNOSIS — R410 Disorientation, unspecified: Secondary | ICD-10-CM | POA: Diagnosis not present

## 2017-11-23 LAB — CBC WITH DIFFERENTIAL/PLATELET
Basophils Absolute: 0 10*3/uL (ref 0.0–0.1)
Basophils Relative: 0 %
Eosinophils Absolute: 0.2 10*3/uL (ref 0.0–0.7)
Eosinophils Relative: 2 %
HEMATOCRIT: 46.1 % (ref 39.0–52.0)
HEMOGLOBIN: 16.1 g/dL (ref 13.0–17.0)
LYMPHS ABS: 2.8 10*3/uL (ref 0.7–4.0)
Lymphocytes Relative: 27 %
MCH: 32.5 pg (ref 26.0–34.0)
MCHC: 34.9 g/dL (ref 30.0–36.0)
MCV: 92.9 fL (ref 78.0–100.0)
MONOS PCT: 11 %
Monocytes Absolute: 1.1 10*3/uL — ABNORMAL HIGH (ref 0.1–1.0)
NEUTROS PCT: 60 %
Neutro Abs: 6.3 10*3/uL (ref 1.7–7.7)
Platelets: 271 10*3/uL (ref 150–400)
RBC: 4.96 MIL/uL (ref 4.22–5.81)
RDW: 13.2 % (ref 11.5–15.5)
WBC: 10.4 10*3/uL (ref 4.0–10.5)

## 2017-11-23 LAB — COMPREHENSIVE METABOLIC PANEL
ALK PHOS: 72 U/L (ref 38–126)
ALT: 30 U/L (ref 0–44)
ANION GAP: 12 (ref 5–15)
AST: 24 U/L (ref 15–41)
Albumin: 4.5 g/dL (ref 3.5–5.0)
BILIRUBIN TOTAL: 0.4 mg/dL (ref 0.3–1.2)
BUN: 8 mg/dL (ref 6–20)
CALCIUM: 9.7 mg/dL (ref 8.9–10.3)
CO2: 27 mmol/L (ref 22–32)
Chloride: 102 mmol/L (ref 98–111)
Creatinine, Ser: 0.97 mg/dL (ref 0.61–1.24)
GFR calc Af Amer: >60 mL/min (ref >60)
GFR calc non Af Amer: >60 mL/min (ref >60)
Glucose, Bld: 110 mg/dL — ABNORMAL HIGH (ref 70–99)
Potassium: 3.6 mmol/L (ref 3.5–5.1)
Sodium: 141 mmol/L (ref 135–145)
TOTAL PROTEIN: 7.6 g/dL (ref 6.5–8.1)

## 2017-11-23 LAB — URINALYSIS, ROUTINE W REFLEX MICROSCOPIC
Bilirubin Urine: NEGATIVE
GLUCOSE, UA: NEGATIVE mg/dL
Hgb urine dipstick: NEGATIVE
KETONES UR: 5 mg/dL — AB
LEUKOCYTES UA: NEGATIVE
NITRITE: NEGATIVE
PROTEIN: NEGATIVE mg/dL
Specific Gravity, Urine: 1.019 (ref 1.005–1.030)
pH: 6 (ref 5.0–8.0)

## 2017-11-23 MED ORDER — IOPAMIDOL (ISOVUE-370) INJECTION 76%
INTRAVENOUS | Status: AC
  Filled 2017-11-23: qty 100

## 2017-11-23 MED ORDER — IOPAMIDOL (ISOVUE-370) INJECTION 76%
100.0000 mL | Freq: Once | INTRAVENOUS | Status: AC | PRN
  Administered 2017-11-23: 80 mL via INTRAVENOUS

## 2017-11-23 NOTE — ED Notes (Signed)
Patient transported to CT 

## 2017-11-23 NOTE — ED Provider Notes (Signed)
Emergency Department Provider Note   I have reviewed the triage vital signs and the nursing notes.   HISTORY  Chief Complaint Altered Mental Status   HPI Phillip [name] is a 47 y.o. male with multiple psychiatric issues document below the presents to the emergency department today secondary to generalized confusion and fogginess.  Patient is not able to articulate a very well but it sounds like this morning he woke up in is having trouble with his thoughts.  His girlfriend states he had intermittent slurred speech but no other neurologic deficits.  Has had a fever, cough or other infectious symptoms but has been itching at a wound on his left finger for the last few days after he was bit by something while out on his acreage.  No vision changes, chest pain, back pain, abdominal pain.  No rashes elsewhere.  No recent medication changes.  He is on Seroquel but has been for multiple months.  Since also feels some ringing in his ears and a whooshing sound consistent with his heartbeat. No other associated or modifying symptoms.    Past Medical History:  Diagnosis Date  . Anxiety   . Bipolar disorder (HCC)   . Depression   . PTSD (post-traumatic stress disorder)     Patient Active Problem List   Diagnosis Date Noted  . Borderline personality disorder in adult [facility](HCC) 08/16/2016  . Bipolar I disorder (HCC) 07/06/2016  . Borderline personality disorder (HCC) 07/06/2016  . 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 Major depressive disorder, recurrent, severe without psychotic features (HCC)   . PTSD (post-traumatic stress disorder) 01/29/2014    Past Surgical History:  Procedure Laterality Date  . cholesterol    . WISDOM TOOTH EXTRACTION      Current Outpatient Rx  . Order #: [id number] Class: Normal  . Order #: [id number] Class: Normal  . Order #: [id number] Class: Normal  . Order #: [id number] Class: Normal    Allergies Vicodin [hydrocodone-acetaminophen]; Mellaril  [thioridazine]; and Testosterone  Family History  Problem Relation Age of Onset  . Cancer Mother   . Cancer Father   . Depression Father     Social History Social History   Tobacco Use  . Smoking status: Current Every Day Smoker    Packs/day: 0.60    Years: 19.00    Pack years: 11.40    Types: Cigarettes  . Smokeless tobacco: Never Used  . Tobacco comment: Has cut back to 4 a day and trying to stop  Substance Use Topics  . Alcohol use: No  . Drug use: No    Types: Marijuana    Comment: seldom    Review of Systems  All other systems negative except as documented in the HPI. All pertinent positives and negatives as reviewed in the HPI. ____________________________________________   PHYSICAL EXAM:  VITAL SIGNS: ED Triage Vitals [11/23/17 1643]  Enc Vitals Group     BP (!) 163/101     Pulse Rate 96     Resp 18     Temp 98.2 F (36.8 C)     Temp Source Oral     SpO2 97 %     Weight      Height      Head Circumference      Peak Flow      Pain Score      Pain Loc      Pain Edu?      Excl. in GC?     Constitutional: Alert and oriented. Well  appearing and in no acute distress. Eyes: Conjunctivae are normal. PERRL. EOMI. Head: Atraumatic. Nose: No congestion/rhinnorhea. Mouth/Throat: Mucous membranes are moist.  Oropharynx non-erythematous. Neck: No stridor.  No meningeal signs.   Cardiovascular: Normal rate, regular rhythm. Good peripheral circulation. Grossly normal heart sounds.   Respiratory: Normal respiratory effort.  No retractions. Lungs CTAB. Gastrointestinal: Soft and nontender. No distention.  Musculoskeletal: No lower extremity tenderness nor edema. No gross deformities of extremities. Neurologic:  Normal speech and language. No gross focal neurologic deficits are appreciated. No altered mental status, able to give full seemingly accurate history.  Face is symmetric, EOM's intact, pupils equal and reactive, vision intact, tongue and uvula midline  without deviation. Upper and Lower extremity motor 5/5, intact pain perception in distal extremities, 2+ reflexes in biceps, patella and achilles tendons. Able to perform finger to nose normal with both hands. Walks without assistance or evident ataxia.  Skin:  Skin is warm, dry and intact. No rash noted.  ____________________________________________   LABS (all labs ordered are listed, but only abnormal results are displayed)  Labs Reviewed  CBC WITH DIFFERENTIAL/PLATELET - Abnormal; Notable for the following components:      Result Value   Monocytes Absolute 1.1 (*)    All other components within normal limits  COMPREHENSIVE METABOLIC PANEL - Abnormal; Notable for the following components:   Glucose, Bld 110 (*)    All other components within normal limits  URINALYSIS, ROUTINE W REFLEX MICROSCOPIC - Abnormal; Notable for the following components:   Ketones, ur 5 (*)    All other components within normal limits   ____________________________________________  EKG   EKG Interpretation  Date/Time:  Friday November 23 2017 18:09:40 EDT Ventricular Rate:  72 PR Interval:    QRS Duration: 85 QT Interval:  371 QTC Calculation: 406 R Axis:   20 Text Interpretation:  Sinus rhythm improved TWI in III compared to may 2016 Confirmed by Marily Memos [phone]) on 11/23/2017 8:14:01 PM      ____________________________________________  RADIOLOGY  Ct Angio Head W Or Wo Contrast  Result Date: 11/23/2017 CLINICAL DATA:  INITIAL EVALUATION FOR ACUTE HEADACHE, INCREASED CONFUSION. EXAM: CT ANGIOGRAPHY HEAD TECHNIQUE: Multidetector CT imaging of the head was performed using the standard protocol during bolus administration of intravenous contrast. Multiplanar CT image reconstructions and MIPs were obtained to evaluate the vascular anatomy. CONTRAST:  80mL ISOVUE-370 IOPAMIDOL (ISOVUE-370) INJECTION 76% COMPARISON:  PRIOR CT FROM 06/10/2014. FINDINGS: CT HEAD Brain: Cerebral volume within normal  limits for patient age. No evidence for acute intracranial hemorrhage. No findings to suggest acute large vessel territory infarct. No mass lesion, midline shift, or mass effect. Ventricles are normal in size without evidence for hydrocephalus. No extra-axial fluid collection identified. Vascular: No hyperdense vessel identified. Skull: Scalp soft tissues demonstrate no acute abnormality. Calvarium intact. Sinuses/Orbits: Globes and orbital soft tissues within normal limits. Visualized paranasal sinuses are clear. No mastoid effusion. CTA HEAD Anterior circulation: Internal carotid arteries widely patent to the termini bilaterally. 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 A1 segments, anterior communicating artery common anterior cerebral arteries widely patent. M1 segments widely patent bilaterally. Distal MCA branches well perfused and symmetric. Posterior circulation: Vertebral arteries widely patent to the vertebrobasilar junction. Left vertebral artery dominant. Posterior inferior cerebral arteries patent bilaterally. Basilar artery widely patent to its distal aspect. Superior cerebral arteries patent bilaterally. Left PCA supplied via the basilar. Fetal type origin of the right PCA. PCAs widely patent to their distal aspects. Venous sinuses: Patent. Anatomic variants:  Fetal type right PCA. 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 Delayed phase: No abnormal enhancement. IMPRESSION: 1. Negative CTA of the head. No acute intracranial abnormality identified. 2. Persistent right trigeminal artery. Electronically Signed   By: Rise Mu M.D.   On: 11/23/2017 21:10   Dg Chest 2 View  Result Date: 11/23/2017 CLINICAL DATA:  Altered mental status. EXAM: CHEST - 2 VIEW COMPARISON:  Chest x-ray dated Jul 29, 2014. FINDINGS: The heart size and mediastinal contours are within normal limits. Both lungs are clear. The visualized skeletal structures are unremarkable. IMPRESSION: No active  cardiopulmonary disease. Electronically Signed   By: Obie Dredge M.D.   On: 11/23/2017 19:02    ____________________________________________   PROCEDURES  Procedure(s) performed:   Procedures   ____________________________________________   INITIAL IMPRESSION / ASSESSMENT AND PLAN / ED COURSE  Unclear etiology for his symptoms however could be related to his elevated blood pressure, Seroquel.  Low suspicion for systemic disease but will evaluate appropriately for possible TIA versus any evidence of endorgan damage from his high blood pressure.  Workup unremarkable. Stable for dc w/ pcp follow up.      Pertinent labs & imaging results that were available during my care of the patient were reviewed by me and considered in my medical decision making (see chart for details).  ____________________________________________  FINAL CLINICAL IMPRESSION(S) / ED DIAGNOSES  Final diagnoses:  Confusion     MEDICATIONS GIVEN DURING THIS VISIT:  Medications  iopamidol (ISOVUE-370) 76 % injection (has no administration in time range)  iopamidol (ISOVUE-370) 76 % injection 100 mL (80 mLs Intravenous Contrast Given 11/23/17 2011)     NEW OUTPATIENT MEDICATIONS STARTED DURING THIS VISIT:  Discharge Medication List as of 11/23/2017  9:45 PM      Note:  This note was prepared with assistance of Dragon voice recognition software. Occasional wrong-word or sound-a-like substitutions may have occurred due to the inherent limitations of voice recognition software.   Marily Memos, MD 11/23/17 [phone]

## 2017-11-23 NOTE — ED Triage Notes (Signed)
Pt states he's had increased confusion, ringing in his ears. Pt states he felt this way since waking up today. Pt called his psychiatrist and the triage nurse recommended pt go to ED. Pt states he was bitten by an insect on his left index finger a couple of days ago.

## 2017-12-11 ENCOUNTER — Other Ambulatory Visit (HOSPITAL_COMMUNITY): Payer: Self-pay

## 2017-12-11 DIAGNOSIS — F319 Bipolar disorder, unspecified: Secondary | ICD-10-CM

## 2017-12-11 MED ORDER — CLONAZEPAM 1 MG PO TABS: 1.0000 mg | ORAL_TABLET | Freq: Three times a day (TID) | ORAL | 0 refills | Status: DC

## 2018-01-01 ENCOUNTER — Ambulatory Visit (INDEPENDENT_AMBULATORY_CARE_PROVIDER_SITE_OTHER): Payer: Medicare Other | Admitting: Psychiatry

## 2018-01-01 VITALS — BP 126/80 | HR 74 | Ht 70.5 in | Wt 189.0 lb

## 2018-01-01 DIAGNOSIS — F4310 Post-traumatic stress disorder, unspecified: Secondary | ICD-10-CM

## 2018-01-01 DIAGNOSIS — F319 Bipolar disorder, unspecified: Secondary | ICD-10-CM | POA: Diagnosis not present

## 2018-01-01 DIAGNOSIS — F332 Major depressive disorder, recurrent severe without psychotic features: Secondary | ICD-10-CM | POA: Diagnosis not present

## 2018-01-01 MED ORDER — QUETIAPINE FUMARATE 100 MG PO TABS: 100.0000 mg | ORAL_TABLET | Freq: Three times a day (TID) | ORAL | 0 refills | Status: DC

## 2018-01-01 MED ORDER — QUETIAPINE FUMARATE 300 MG PO TABS: 300.0000 mg | ORAL_TABLET | Freq: Every day | ORAL | 0 refills | Status: DC

## 2018-01-01 MED ORDER — CLONAZEPAM 1 MG PO TABS: 1.0000 mg | ORAL_TABLET | Freq: Three times a day (TID) | ORAL | 0 refills | Status: DC

## 2018-01-01 MED ORDER — LAMOTRIGINE 25 MG PO TABS: ORAL_TABLET | ORAL | 0 refills | Status: DC

## 2018-01-01 NOTE — Progress Notes (Signed)
BH MD/PA/NP OP Progress Note  01/01/2018 3:12 PM Phillip [name]  MRN:  [id number]  Chief Complaint: I am feeling so-so.  HPI: Phillip [name] is a 47 year old Caucasian man who has seen Dr. Rene Kocher for past 1 years.  Dr. Rene Kocher left the practice.  Patient diagnosed with bipolar disorder, PTSD, major depression and borderline personality disorder.  He continues to struggle with chronic symptoms of depression, irritability, passive and fleeting suicidal thoughts and paranoia.  He also endorsed fatigue, lack of energy and socially withdrawn.  Recently he had a break-up from his ex-girlfriend.  He has to take protection order against her.  He lives by himself.  He is pleased that relationship ended.  Patient has been in the [employer] and suffers chronic PTSD.  He continued to endorse nightmares, flashback, poor sleep and racing thoughts.  He like to go back on stimulant to help his energy and focus.  In the past he had tried Vyvanse but his insurance does not approve.  Patient is on disability.  Currently he is taking Klonopin 1 mg 3 times a day, Seroquel 300 mg at bedtime, Seroquel 100 mg 3 times a day.  Though he denies any active suicidal thoughts or any plan but admitted chronic and passive suicidal thinking very frequently.  He feels some time very hopeless and worthless.  He is not seeing any therapist.  He has not recently engaged in any self harming behavior.  He denies drinking or using any illegal substances.  Visit Diagnosis:    ICD-10-CM   1. Major depressive disorder, recurrent, severe without psychotic features (HCC) F33.2 lamoTRIgine (LAMICTAL) 25 MG tablet    clonazePAM (KLONOPIN) 1 MG tablet  2. Bipolar I disorder (HCC) F31.9 QUEtiapine (SEROQUEL) 100 MG tablet    QUEtiapine (SEROQUEL) 300 MG tablet    clonazePAM (KLONOPIN) 1 MG tablet  3. PTSD (post-traumatic stress disorder) F43.10     Past Psychiatric History: Reviewed Patient has multiple hospitalization and he had a history of poor  compliance with medication.  [facility]  He also had a psychiatric admission in [place], [place] and other states.  He has a history cutting his wrist and taken overdose in the past.  He had tried Abilify, Ambien, Tegretol, Thorazine, Klonopin, Luvox, gabapentin, Vistaril, Remeron, prazosin, Risperdal, Seroquel and trazodone.  Past Medical History:  Past Medical History:  Diagnosis Date  . Anxiety   . Bipolar disorder (HCC)   . Depression   . PTSD (post-traumatic stress disorder)     Past Surgical History:  Procedure Laterality Date  . cholesterol    . WISDOM TOOTH EXTRACTION      Family Psychiatric History: Reviewed  Family History:  Family History  Problem Relation Age of Onset  . Cancer Mother   . Cancer Father   . Depression Father     Social History:  Social History   Socioeconomic History  . Marital status: Divorced    Spouse name: Not on file  . Number of children: Not on file  . Years of education: Not on file  . Highest education level: Not on file  Occupational History  . Not on file  Social Needs  . Financial resource strain: Not on file  . Food insecurity:    Worry: Not on file    Inability: Not on file  . Transportation needs:    Medical: Not on file    Non-medical: Not on file  Tobacco Use  .  Smoking status: Current Every Day Smoker    Packs/day: 0.60    Years: 19.00    Pack years: 11.40    Types: Cigarettes  . Smokeless tobacco: Never Used  . Tobacco comment: Has cut back to 4 a day and trying to stop  Substance and Sexual Activity  . Alcohol use: No  . Drug use: No    Types: Marijuana    Comment: seldom  . Sexual activity: Yes    Partners: Female    Birth control/protection: None  Lifestyle  . Physical activity:    Days per week: Not on file    Minutes per session: Not on file  . Stress: Not on file  Relationships  . Social connections:    Talks on phone: Not on file    Gets  together: Not on file    Attends religious service: Not on file    Active member of club or organization: Not on file    Attends meetings of clubs or organizations: Not on file    Relationship status: Not on file  Other Topics Concern  . Not on file  Social History Narrative  . Not on file    Allergies:  Allergies  Allergen Reactions  . Vicodin [Hydrocodone-Acetaminophen] Nausea And Vomiting  . Mellaril [Thioridazine] Swelling    Bilateral leg swelling  . Testosterone     Other reaction(s): Anaphylaxis    Metabolic Disorder Labs: Lab Results  Component Value Date   HGBA1C 5.2 06/04/2016   MPG 103 06/04/2016   MPG 117 06/15/2014   Lab Results  Component Value Date   PROLACTIN 19.5 (H) 06/04/2016   Lab Results  Component Value Date   CHOL 313 (H) 06/04/2016   TRIG 299 (H) 06/04/2016   HDL 46 06/04/2016   CHOLHDL 6.8 06/04/2016   VLDL 60 (H) 06/04/2016   LDLCALC 207 (H) 06/04/2016   LDLCALC 132 (H) 01/29/2014   Lab Results  Component Value Date   TSH 4.830 (H) 08/28/2016   TSH 4.220 06/04/2016    Therapeutic Level Labs: No results found for: LITHIUM No results found for: VALPROATE No components found for:  CBMZ  Current Medications: Current Outpatient Medications  Medication Sig Dispense Refill  . clonazePAM (KLONOPIN) 1 MG tablet Take 1 tablet (1 mg total) by mouth 3 (three) times daily. 90 tablet 0  . omeprazole (PRILOSEC) 20 MG capsule Take 1 capsule (20 mg total) by mouth 2 (two) times daily before a meal. (Patient taking differently: Take 20 mg by mouth 2 (two) times daily as needed (heartburn). ) 180 capsule 0  . QUEtiapine (SEROQUEL) 100 MG tablet Take 1 tablet (100 mg total) by mouth 3 (three) times daily. 270 tablet 1  . QUEtiapine (SEROQUEL) 300 MG tablet Take 1 tablet (300 mg total) by mouth at bedtime. 90 tablet 1   No current facility-administered medications for this visit.      Musculoskeletal: Strength & Muscle Tone: within normal  limits Gait & Station: normal Patient leans: N/A  Psychiatric Specialty Exam: ROS  Blood pressure 126/80, pulse 74, height 5' 10.5" (1.791 m), weight 189 lb (85.7 kg).Body mass index is 26.74 kg/m.  General Appearance: Guarded  Eye Contact:  Fair  Speech:  Slow  Volume:  Decreased  Mood:  Anxious and Hopeless  Affect:  Constricted and Depressed  Thought Process:  Goal Directed  Orientation:  Full (Time, Place, and Person)  Thought Content: Paranoid Ideation and Rumination   Suicidal Thoughts:  passive suicidal thoughts but  no plan  Homicidal Thoughts:  No  Memory:  Immediate;   Good Recent;   Good Remote;   Good  Judgement:  Good  Insight:  Good  Psychomotor Activity:  Decreased  Concentration:  Concentration: Fair and Attention Span: Fair  Recall:  [employer] of Knowledge: Good  Language: Good  Akathisia:  No  Handed:  Right  AIMS (if indicated): not done  Assets:  Communication Skills Desire for Improvement Resilience  ADL's:  Intact  Cognition: WNL  Sleep:  Fair   Screenings: AIMS     [facility]  AIMS Total Score  0  0    AUDIT     [facility]) [facility] (Discharged) [facility]Discharged) [facility]  Alcohol Use Disorder Identification Test Final Score (AUDIT)  3  1  0  1       Assessment and Plan: Phillip [name] is a 47 year old Caucasian man who is on disability suffers from multiple psychiatric illness including PTSD, major depression, bipolar disorder and borderline personality disorder.  I had a  long discussion with the patient about his current symptoms, long-term prognosis, medication side effects.  I do believe he should see a therapist for coping skills.  We also talked about EMDR to help his chronic symptoms of PTSD.  I also gave him a option to explore TMS and he is willing to think about it and I have provided brochure to review it.  I will also start low-dose Lamictal to help his chronic suicidal thoughts and mood.  He like to come off from Seroquel which I agreed because it may be contributing to fatigue and lack of energy.  For now he will continue Seroquel 300 mg at bedtime and Seroquel 100 mg 3 times a day but once limited to the start working we will eventually cut down the Seroquel dose.  I will continue Klonopin 1 mg 3 times a day.  We will schedule him to see a therapist in this office for supportive therapy.  Discussed in length about medication side effects and benefits.  Discussed safety concerns at any time having active suicidal thoughts or homicidal thought the need to call 911 or go to local emergency room.  Time spent 40 minutes.  More than 50% of the time spent in psychoeducation, counseling, coordination of care, long-term prognosis of the illness and reviewing his collateral information from other providers.  I recommended that if Lamictal start giving rash that he need to stop the medication immediately.  Follow-up in 4 weeks.     Cleotis Nipper, MD 01/01/2018, 3:12 PM

## 2018-01-28 ENCOUNTER — Ambulatory Visit (INDEPENDENT_AMBULATORY_CARE_PROVIDER_SITE_OTHER): Payer: Medicare Other | Admitting: Licensed Clinical Social Worker

## 2018-01-28 ENCOUNTER — Encounter (HOSPITAL_COMMUNITY): Payer: Self-pay | Admitting: Licensed Clinical Social Worker

## 2018-01-28 DIAGNOSIS — F332 Major depressive disorder, recurrent severe without psychotic features: Secondary | ICD-10-CM | POA: Diagnosis not present

## 2018-01-28 DIAGNOSIS — F4310 Post-traumatic stress disorder, unspecified: Secondary | ICD-10-CM
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
## 2018-02-01 DIAGNOSIS — S91332A Puncture wound without foreign body, left foot, initial encounter: Secondary | ICD-10-CM | POA: Diagnosis not present

## 2018-02-05 ENCOUNTER — Ambulatory Visit (INDEPENDENT_AMBULATORY_CARE_PROVIDER_SITE_OTHER): Payer: Medicare Other | Admitting: Psychiatry

## 2018-02-05 ENCOUNTER — Encounter (HOSPITAL_COMMUNITY): Payer: Self-pay | Admitting: Psychiatry

## 2018-02-05 DIAGNOSIS — F603 Borderline personality disorder: Secondary | ICD-10-CM | POA: Diagnosis not present

## 2018-02-05 DIAGNOSIS — F4310 Post-traumatic stress disorder, unspecified: Secondary | ICD-10-CM

## 2018-02-05 DIAGNOSIS — F332 Major depressive disorder, recurrent severe without psychotic features: Secondary | ICD-10-CM

## 2018-02-05 DIAGNOSIS — F319 Bipolar disorder, unspecified: Secondary | ICD-10-CM

## 2018-02-05 DIAGNOSIS — F314 Bipolar disorder, current episode depressed, severe, without psychotic features: Secondary | ICD-10-CM

## 2018-02-05 MED ORDER — CLONAZEPAM 1 MG PO TABS: 1.0000 mg | ORAL_TABLET | Freq: Three times a day (TID) | ORAL | 1 refills | Status: DC

## 2018-02-05 NOTE — Progress Notes (Signed)
BH MD/PA/NP OP Progress Note  02/05/2018 2:35 PM Phillip [name]  MRN:  [id number]  Chief Complaint: I stopped taking Lamictal because it was causing the nausea and I feel sick.  HPI: Phillip [name] came for his follow-up appointment.  On his last visit we started him on Lamictal because he continued to endorse mood swings, irritability and having chronic symptoms of depression and passive and fleeting suicidal thoughts.  He took Lamictal for 10 days but stopped after having persistent nausea and feeling sick.  His symptoms remains the same but he wants to continue the same medication.  He started seeing Shanda Bumps for therapy.  He is still thinking about TMS and have not make a decision.  Patient has chronic PTSD symptoms.  He has nightmares and flashback with racing thoughts and poor sleep.  Patient denies any active suicidal thoughts.  Sometime he feels hopeless but since he started therapy he has more hope and he wants to continue therapy.  Patient has not engaged in any self harming behavior.  He denies drinking or using any illegal substances.  Visit Diagnosis:    ICD-10-CM   1. Bipolar I disorder (HCC) F31.9 clonazePAM (KLONOPIN) 1 MG tablet  2. Major depressive disorder, recurrent, severe without psychotic features (HCC) F33.2 clonazePAM (KLONOPIN) 1 MG tablet    Past Psychiatric History: Reviewed. Patient has multiple hospitalization and he had a history of poor compliance with medication.  [facility]  He also had a psychiatric admission in [place], [place] and other states.  He has history cutting his wrist and taken overdose in the past.  He had tried Abilify, Ambien, Tegretol, Thorazine, Klonopin, Luvox, gabapentin, Vistaril, Remeron, prazosin, Risperdal, Seroquel and trazodone.  Recently we tried Lamictal but stopped due to persistent nausea.  Past Medical History:  Past Medical History:  Diagnosis Date  . Anxiety   . Bipolar  disorder (HCC)   . Depression   . PTSD (post-traumatic stress disorder)     Past Surgical History:  Procedure Laterality Date  . cholesterol    . WISDOM TOOTH EXTRACTION      Family Psychiatric History: Reviewed.  Family History:  Family History  Problem Relation Age of Onset  . Cancer Mother   . Cancer Father   . Depression Father     Social History:  Social History   Socioeconomic History  . Marital status: Divorced    Spouse name: Not on file  . Number of children: Not on file  . Years of education: Not on file  . Highest education level: Not on file  Occupational History  . Not on file  Social Needs  . Financial resource strain: Not on file  . Food insecurity:    Worry: Not on file    Inability: Not on file  . Transportation needs:    Medical: Not on file    Non-medical: Not on file  Tobacco Use  . Smoking status: Current Every Day Smoker    Packs/day: 0.60    Years: 19.00    Pack years: 11.40    Types: Cigarettes  . Smokeless tobacco: Never Used  . Tobacco comment: Has cut back to 4 a day and trying to stop  Substance and Sexual Activity  . Alcohol use: No  . Drug use: No    Types: Marijuana    Comment: seldom  . Sexual activity: Yes    Partners: Female    Birth control/protection: None  Lifestyle  .  Physical activity:    Days per week: Not on file    Minutes per session: Not on file  . Stress: Not on file  Relationships  . Social connections:    Talks on phone: Not on file    Gets together: Not on file    Attends religious service: Not on file    Active member of club or organization: Not on file    Attends meetings of clubs or organizations: Not on file    Relationship status: Not on file  Other Topics Concern  . Not on file  Social History Narrative  . Not on file    Allergies:  Allergies  Allergen Reactions  . Vicodin [Hydrocodone-Acetaminophen] Nausea And Vomiting  . Mellaril [Thioridazine] Swelling    Bilateral leg swelling  .  Testosterone     Other reaction(s): Anaphylaxis    Metabolic Disorder Labs: Lab Results  Component Value Date   HGBA1C 5.2 06/04/2016   MPG 103 06/04/2016   MPG 117 06/15/2014   Lab Results  Component Value Date   PROLACTIN 19.5 (H) 06/04/2016   Lab Results  Component Value Date   CHOL 313 (H) 06/04/2016   TRIG 299 (H) 06/04/2016   HDL 46 06/04/2016   CHOLHDL 6.8 06/04/2016   VLDL 60 (H) 06/04/2016   LDLCALC 207 (H) 06/04/2016   LDLCALC 132 (H) 01/29/2014   Lab Results  Component Value Date   TSH 4.830 (H) 08/28/2016   TSH 4.220 06/04/2016    Therapeutic Level Labs: No results found for: LITHIUM No results found for: VALPROATE No components found for:  CBMZ  Current Medications: Current Outpatient Medications  Medication Sig Dispense Refill  . clonazePAM (KLONOPIN) 1 MG tablet Take 1 tablet (1 mg total) by mouth 3 (three) times daily. 90 tablet 0  . omeprazole (PRILOSEC) 20 MG capsule Take 1 capsule (20 mg total) by mouth 2 (two) times daily before a meal. (Patient taking differently: Take 20 mg by mouth 2 (two) times daily as needed (heartburn). ) 180 capsule 0  . QUEtiapine (SEROQUEL) 100 MG tablet Take 1 tablet (100 mg total) by mouth 3 (three) times daily. 270 tablet 0  . QUEtiapine (SEROQUEL) 300 MG tablet Take 1 tablet (300 mg total) by mouth at bedtime. 90 tablet 0  . lamoTRIgine (LAMICTAL) 25 MG tablet Take one tab daily for 1 week and than 2 tab daily (Patient not taking: Reported on 02/05/2018) 60 tablet 0   No current facility-administered medications for this visit.      Musculoskeletal: Strength & Muscle Tone: within normal limits Gait & Station: normal Patient leans: N/A  Psychiatric Specialty Exam: ROS  Blood pressure 122/70, height 5' 10.5" (1.791 m), weight 199 lb (90.3 kg).Body mass index is 28.15 kg/m.  General Appearance: Superficially cooperative.  Eye Contact:  Fair  Speech:  Slow  Volume:  Decreased  Mood:  Anxious and Dysphoric   Affect:  Constricted  Thought Process:  Goal Directed  Orientation:  Full (Time, Place, and Person)  Thought Content: Rumination   Suicidal Thoughts:  Passive and chronic fleeting suicidal thoughts but no plan or any intent  Homicidal Thoughts:  No  Memory:  Immediate;   Good Recent;   Good Remote;   Good  Judgement:  Fair  Insight:  Present  Psychomotor Activity:  Decreased  Concentration:  Concentration: Fair and Attention Span: Fair  Recall:  [employer] of Knowledge: Good  Language: Good  Akathisia:  No  Handed:  Right  AIMS (if indicated): not done  Assets:  Communication Skills Desire for Improvement  ADL's:  Intact  Cognition: WNL  Sleep:  Fair   Screenings: AIMS     [facility]  AIMS Total Score  0  0    AUDIT     [facility]) [facility]charged[facility]Ad[facility]Discharged) from [facility]  Alcohol Use Disorder Identification Test Final Score (AUDIT)  3  1  0  1       Assessment and Plan: Posttraumatic stress disorder.  Major depressive disorder.  Bipolar disorder and borderline personality traits.  Patient is stopped Lamictal after having side effects mostly nausea.  He does not want to try a new medication which I agree as patient has tried multiple medication with limited response.  One more time I encouraged to explore TMS and he promised that he will look into it seriously.  He started seeing Shanda Bumps which is helping and giving more hope.  I encouraged to continue therapy with Shanda Bumps.  For now I will  continue Seroquel 100 mg 3 times a day and Seroquel 300 mg at bedtime.  He will also continue Klonopin 1 mg 3 times a day.  I will discontinue Lamictal due to side effects.  Discussed safety concerns at any time having active suicidal thoughts or homicidal thought that he need to call 911 or go to local emergency room.  Follow-up in 2 months.  Discussed benzodiazepine dependence tolerance and withdrawal.   Cleotis Nipper, MD 02/05/2018, 2:35 PM

## 2018-02-26 ENCOUNTER — Ambulatory Visit (HOSPITAL_COMMUNITY): Payer: Medicare Other | Admitting: Licensed Clinical Social Worker

## 2018-03-12 ENCOUNTER — Ambulatory Visit (INDEPENDENT_AMBULATORY_CARE_PROVIDER_SITE_OTHER): Payer: Medicare Other | Admitting: Licensed Clinical Social Worker

## 2018-03-12 ENCOUNTER — Encounter (HOSPITAL_COMMUNITY): Payer: Self-pay | Admitting: Licensed Clinical Social Worker

## 2018-03-12 DIAGNOSIS — F319 Bipolar disorder, unspecified: Secondary | ICD-10-CM

## 2018-03-12 DIAGNOSIS — F4310 Post-traumatic stress disorder, unspecified: Secondary | ICD-10-CM

## 2018-03-12 NOTE — Progress Notes (Signed)
   THERAPIST PROGRESS NOTE  Session Time: 1:30pm-2:30pm  Participation Level: Active  Behavioral Response: Well GroomedAlertAnxious and Depressed   Type of Therapy: Individual Therapy  Treatment Goals addressed: "to increase my quality of life, to be able to have a more normal life".   Interventions: Motivational Interviewing, CBT, and Other: Grounding and Mindfulness techniques  Summary: Phillip [name] is a 47 y.o. male who presents with Bipolar I Disorder, depressed and PTSD.  Suicidal/Homicidal: No without intent/plan  Therapist Response:  Phillip [name] met with clinician for individual therapy. Phillip [name] discussed his psychiatric symptoms and current life events. Phillip [name] shared ongoing paranoia, refusal to leave his home, and a great deal of memories flooding his mind about his traumatic hx. Clinician provided psychoeducation about these sxs and noted the possibility that sxs are increasing due to starting therapy. Clinician encouraged Phillip [name] to continue coming to therapy and to give himself time to process through his worries and to make an attempt at wellness. Phillip [name] began to process some of his traumatic experiences in outcomes of being in the [employer].   Plan: Return again in 2-3 weeks.  Diagnosis:     Axis I: Bipolar I Disorder, depressed and PTSD.   Phillip Marker [place], LCSW 03/12/2018

## 2018-04-02 ENCOUNTER — Ambulatory Visit (HOSPITAL_COMMUNITY): Payer: Medicare Other | Admitting: Licensed Clinical Social Worker

## 2018-04-03 ENCOUNTER — Other Ambulatory Visit (HOSPITAL_COMMUNITY): Payer: Self-pay

## 2018-04-03 DIAGNOSIS — F319 Bipolar disorder, unspecified: Secondary | ICD-10-CM

## 2018-04-03 DIAGNOSIS — F332 Major depressive disorder, recurrent severe without psychotic features: Secondary | ICD-10-CM

## 2018-04-03 MED ORDER — CLONAZEPAM 1 MG PO TABS: 1.0000 mg | ORAL_TABLET | Freq: Three times a day (TID) | ORAL | 0 refills | Status: DC

## 2018-04-09 ENCOUNTER — Ambulatory Visit (HOSPITAL_COMMUNITY): Payer: Medicare Other | Admitting: Psychiatry

## 2018-04-23 DIAGNOSIS — N200 Calculus of kidney: Secondary | ICD-10-CM | POA: Diagnosis not present

## 2018-04-23 DIAGNOSIS — K5792 Diverticulitis of intestine, part unspecified, without perforation or abscess without bleeding: Secondary | ICD-10-CM | POA: Diagnosis not present

## 2018-04-23 DIAGNOSIS — K5732 Diverticulitis of large intestine without perforation or abscess without bleeding: Secondary | ICD-10-CM | POA: Diagnosis not present

## 2018-04-23 DIAGNOSIS — F17210 Nicotine dependence, cigarettes, uncomplicated: Secondary | ICD-10-CM | POA: Diagnosis not present

## 2018-04-25 DIAGNOSIS — K5792 Diverticulitis of intestine, part unspecified, without perforation or abscess without bleeding: Secondary | ICD-10-CM | POA: Diagnosis not present

## 2018-04-25 DIAGNOSIS — K5732 Diverticulitis of large intestine without perforation or abscess without bleeding: Secondary | ICD-10-CM | POA: Diagnosis not present

## 2018-04-25 DIAGNOSIS — R109 Unspecified abdominal pain: Secondary | ICD-10-CM | POA: Diagnosis not present

## 2018-04-25 DIAGNOSIS — F17210 Nicotine dependence, cigarettes, uncomplicated: Secondary | ICD-10-CM | POA: Diagnosis not present

## 2018-05-01 ENCOUNTER — Other Ambulatory Visit (HOSPITAL_COMMUNITY): Payer: Self-pay

## 2018-05-01 DIAGNOSIS — F332 Major depressive disorder, recurrent severe without psychotic features: Secondary | ICD-10-CM

## 2018-05-01 DIAGNOSIS — F319 Bipolar disorder, unspecified: Secondary | ICD-10-CM

## 2018-05-01 DIAGNOSIS — K219 Gastro-esophageal reflux disease without esophagitis: Secondary | ICD-10-CM

## 2018-05-01 MED ORDER — CLONAZEPAM 1 MG PO TABS: 1.0000 mg | ORAL_TABLET | Freq: Three times a day (TID) | ORAL | 0 refills | Status: DC

## 2018-05-28 ENCOUNTER — Ambulatory Visit (INDEPENDENT_AMBULATORY_CARE_PROVIDER_SITE_OTHER): Payer: Medicare Other | Admitting: Psychiatry

## 2018-05-28 VITALS — BP 138/78 | Ht 70.5 in | Wt 192.0 lb

## 2018-05-28 DIAGNOSIS — F4310 Post-traumatic stress disorder, unspecified: Secondary | ICD-10-CM

## 2018-05-28 DIAGNOSIS — F603 Borderline personality disorder: Secondary | ICD-10-CM | POA: Diagnosis not present

## 2018-05-28 DIAGNOSIS — F411 Generalized anxiety disorder: Secondary | ICD-10-CM | POA: Diagnosis not present

## 2018-05-28 DIAGNOSIS — F319 Bipolar disorder, unspecified: Secondary | ICD-10-CM | POA: Diagnosis not present

## 2018-05-28 DIAGNOSIS — F332 Major depressive disorder, recurrent severe without psychotic features: Secondary | ICD-10-CM | POA: Diagnosis not present

## 2018-05-28 MED ORDER — CLONAZEPAM 1 MG PO TABS: 1.0000 mg | ORAL_TABLET | Freq: Three times a day (TID) | ORAL | 1 refills | Status: DC

## 2018-05-28 MED ORDER — LITHIUM CARBONATE ER 300 MG PO TBCR: 300.0000 mg | EXTENDED_RELEASE_TABLET | Freq: Two times a day (BID) | ORAL | 1 refills | Status: DC

## 2018-05-28 MED ORDER — QUETIAPINE FUMARATE 300 MG PO TABS: 300.0000 mg | ORAL_TABLET | Freq: Every day | ORAL | 0 refills | Status: DC

## 2018-05-28 MED ORDER — QUETIAPINE FUMARATE 100 MG PO TABS: 100.0000 mg | ORAL_TABLET | Freq: Three times a day (TID) | ORAL | 0 refills | Status: DC

## 2018-05-28 NOTE — Progress Notes (Signed)
BH MD/PA/NP OP Progress Note  05/28/2018 10:45 AM Phillip [name]  MRN:  [id number]  Chief Complaint: I am feeling more anxious and nervous.  I like to go back on ADD medication.  Since started therapy I have nightmares and flashback.  HPI: Phillip [name] came for his appointment.  He apologized canceling his last appointment.  He is not taking the Lamictal because of persistent nausea.  He continues to have irritability, mood swing and since started therapy he has increased nightmares flashback and racing thoughts.  Despite taking a moderate dose of Seroquel he still struggle with insomnia.  We have commended TMS but he is very resistant to even talk about TMS.  Patient told he did some research and he is not willing to do it.  He lives by himself.  Despite therapy causing increase nightmares he wants to continue it.  His energy level is fair.  He admitted sometimes poor attention, focus and not able to do things at times.  He recall that he was diagnosed with ADD and prescribed stimulant in [place] which was continued by Dr. Rene Kocher but he could not afford Vyvanse and decided to stop.  He feels that he need to go back on it.  He like to go back to work but it was denied and he is getting disability money.  He used to work as a Art therapist.  He lives by himself.  He denies any paranoia or any hallucination.  He is not engaged in any self harming behavior recently.  He is taking Seroquel 100 mg 3 times a day and Seroquel 300 mg at bedtime and Klonopin 1 mg 3 times a day.  Patient has tried multiple medication in the past but they were discontinued because of either side effects or poor outcome.  Visit Diagnosis:    ICD-10-CM   1. PTSD (post-traumatic stress disorder) F43.10   2. Bipolar I disorder (HCC) F31.9 QUEtiapine (SEROQUEL) 300 MG tablet    QUEtiapine (SEROQUEL) 100 MG tablet    clonazePAM (KLONOPIN) 1 MG tablet    lithium carbonate (LITHOBID) 300 MG CR tablet  3. Major depressive disorder,  recurrent, severe without psychotic features (HCC) F33.2 clonazePAM (KLONOPIN) 1 MG tablet  4. Borderline personality disorder (HCC) F60.3     Past Psychiatric History: Reviewed. H/O cutting his wrist, overdose, poor compliant with medication and multiple hospitalization.  Admitted [facility] at [facility]. H/O inpatient in [place], [place] and other states. Tried Abilify, Ambien, Tegretol, Thorazine, Klonopin, Luvox, gabapentin, Vistaril, Remeron, prazosin, Risperdal, Seroquel, Vyvanse, trazodone and Lamictal.  Diagnosed with bipolar, borderline personality disorder, PTSD, ADD and major depressive disorder.  Past Medical History:  Past Medical History:  Diagnosis Date  . Anxiety   . Bipolar disorder (HCC)   . Depression   . PTSD (post-traumatic stress disorder)     Past Surgical History:  Procedure Laterality Date  . cholesterol    . WISDOM TOOTH EXTRACTION      Family Psychiatric History: Reviewed  Family History:  Family History  Problem Relation Age of Onset  . Cancer Mother   . Cancer Father   . Depression Father     Social History:  Social History   Socioeconomic History  . Marital status: Divorced    Spouse name: Not on file  . Number of children: Not on file  . Years of education: Not on file  . Highest education level: Not on file  Occupational History  . Not on file  Social  Needs  . Financial resource strain: Not on file  . Food insecurity:    Worry: Not on file    Inability: Not on file  . Transportation needs:    Medical: Not on file    Non-medical: Not on file  Tobacco Use  . Smoking status: Current Every Day Smoker    Packs/day: 0.60    Years: 19.00    Pack years: 11.40    Types: Cigarettes  . Smokeless tobacco: Never Used  . Tobacco comment: Has cut back to 4 a day and trying to stop  Substance and Sexual Activity  . Alcohol use: No  . Drug use: No    Types: Marijuana    Comment: seldom  . Sexual activity: Yes    Partners: Female     Birth control/protection: None  Lifestyle  . Physical activity:    Days per week: Not on file    Minutes per session: Not on file  . Stress: Not on file  Relationships  . Social connections:    Talks on phone: Not on file    Gets together: Not on file    Attends religious service: Not on file    Active member of club or organization: Not on file    Attends meetings of clubs or organizations: Not on file    Relationship status: Not on file  Other Topics Concern  . Not on file  Social History Narrative  . Not on file    Allergies:  Allergies  Allergen Reactions  . Vicodin [Hydrocodone-Acetaminophen] Nausea And Vomiting  . Mellaril [Thioridazine] Swelling    Bilateral leg swelling  . Testosterone     Other reaction(s): Anaphylaxis    Metabolic Disorder Labs: Lab Results  Component Value Date   HGBA1C 5.2 06/04/2016   MPG 103 06/04/2016   MPG 117 06/15/2014   Lab Results  Component Value Date   PROLACTIN 19.5 (H) 06/04/2016   Lab Results  Component Value Date   CHOL 313 (H) 06/04/2016   TRIG 299 (H) 06/04/2016   HDL 46 06/04/2016   CHOLHDL 6.8 06/04/2016   VLDL 60 (H) 06/04/2016   LDLCALC 207 (H) 06/04/2016   LDLCALC 132 (H) 01/29/2014   Lab Results  Component Value Date   TSH 4.830 (H) 08/28/2016   TSH 4.220 06/04/2016    Therapeutic Level Labs: No results found for: LITHIUM No results found for: VALPROATE No components found for:  CBMZ  Current Medications: Current Outpatient Medications  Medication Sig Dispense Refill  . clonazePAM (KLONOPIN) 1 MG tablet Take 1 tablet (1 mg total) by mouth 3 (three) times daily. 90 tablet 0  . lamoTRIgine (LAMICTAL) 25 MG tablet Take one tab daily for 1 week and than 2 tab daily (Patient not taking: Reported on 02/05/2018) 60 tablet 0  . omeprazole (PRILOSEC) 20 MG capsule Take 1 capsule (20 mg total) by mouth 2 (two) times daily before a meal. (Patient taking differently: Take 20 mg by mouth 2 (two) times daily  as needed (heartburn). ) 180 capsule 0  . QUEtiapine (SEROQUEL) 100 MG tablet Take 1 tablet (100 mg total) by mouth 3 (three) times daily. 270 tablet 0  . QUEtiapine (SEROQUEL) 300 MG tablet Take 1 tablet (300 mg total) by mouth at bedtime. 90 tablet 0   No current facility-administered medications for this visit.      Musculoskeletal: Strength & Muscle Tone: within normal limits Gait & Station: normal Patient leans: N/A  Psychiatric Specialty Exam: ROS  Blood  pressure 138/78, height 5' 10.5" (1.791 m), weight 192 lb (87.1 kg).There is no height or weight on file to calculate BMI.  General Appearance: Fairly Groomed  Eye Contact:  Fair  Speech:  Slow  Volume:  Decreased  Mood:  Anxious and Dysphoric  Affect:  Congruent  Thought Process:  Descriptions of Associations: Intact  Orientation:  Full (Time, Place, and Person)  Thought Content: Rumination   Suicidal Thoughts:  No  Homicidal Thoughts:  No  Memory:  Immediate;   Good Recent;   Good Remote;   Good  Judgement:  Fair  Insight:  Present  Psychomotor Activity:  Decreased  Concentration:  Concentration: Fair and Attention Span: Fair  Recall:  Good  Fund of Knowledge: Good  Language: Good  Akathisia:  No  Handed:  Right  AIMS (if indicated): not done  Assets:  Communication Skills Desire for Improvement Housing Resilience  ADL's:  Intact  Cognition: WNL  Sleep:  Fair   Screenings: AIMS     [facility]  AIMS Total Score  0  0    AUDIT     Ad[facility]) [facility]Discharged) [facility] from  [facility]  Alcohol Use Disorder Identification Test Final Score (AUDIT)  3  1  0  1       Assessment and Plan: Posttraumatic stress disorder, major depressive disorder, recurrent.  Bipolar disorder type I, rule out cluster B traits.  I had a long discussion with the patient about his symptoms, diagnosis, prognosis.  He like to go back on a stimulant however I reminded that most of the time stimulant cause worsening of anxiety and insomnia.  We do not have psychological testing to justify he had ADD.  We talked about getting psychological testing and also GeneSight testing since he had tried multiple medication with either side effects or poor outcome.  Patient does not want to do TMS and seems to be irritated when this topic brought.  He admitted seeing Shanda Bumps causing worsening of nightmares flashback and irritability.  After some discussion he agreed to try lithium which she has never done it before.  We will do gene site testing and referred him to do psychological testing to rule out ADD and prostatic disorder.  We will consider non-stimulant ADD medication if the diagnosis of ADD after psychological testing.  He will continue to see Shanda Bumps for therapy.  Discussed benzodiazepine dependence tolerance and withdrawal.  Continue Seroquel 100 mg 3 times a day, 300 mg at bedtime and Klonopin 1 mg 3 times a day.  Follow-up in 6 weeks. Time spent 30 minutes.  More than 50% of the time spent in psychoeducation, counseling and coordination of care.  Discuss safety plan that anytime having active suicidal thoughts or homicidal thoughts then patient need to call 911 or go to the local emergency room.     Cleotis Nipper, MD 05/28/2018, 10:45 AM

## 2018-07-09 ENCOUNTER — Other Ambulatory Visit: Payer: Self-pay

## 2018-07-09 ENCOUNTER — Ambulatory Visit (INDEPENDENT_AMBULATORY_CARE_PROVIDER_SITE_OTHER): Payer: Medicare Other | Admitting: Psychiatry

## 2018-07-09 DIAGNOSIS — F332 Major depressive disorder, recurrent severe without psychotic features: Secondary | ICD-10-CM | POA: Diagnosis not present

## 2018-07-09 DIAGNOSIS — F603 Borderline personality disorder: Secondary | ICD-10-CM

## 2018-07-09 DIAGNOSIS — F319 Bipolar disorder, unspecified: Secondary | ICD-10-CM

## 2018-07-09 DIAGNOSIS — F900 Attention-deficit hyperactivity disorder, predominantly inattentive type: Secondary | ICD-10-CM

## 2018-07-09 DIAGNOSIS — F4310 Post-traumatic stress disorder, unspecified: Secondary | ICD-10-CM | POA: Diagnosis not present

## 2018-07-09 MED ORDER — ATOMOXETINE HCL 25 MG PO CAPS: 25.0000 mg | ORAL_CAPSULE | Freq: Every day | ORAL | 1 refills | Status: DC

## 2018-07-09 MED ORDER — CLONAZEPAM 1 MG PO TABS: 1.0000 mg | ORAL_TABLET | Freq: Three times a day (TID) | ORAL | 1 refills | Status: DC

## 2018-07-09 NOTE — Progress Notes (Signed)
Virtual Visit via Telephone Note  I connected with Phillip [name] on 07/09/18 at  3:20 PM EDT by telephone and verified that I am speaking with the correct person using two identifiers.   I discussed the limitations, risks, security and privacy concerns of performing an evaluation and management service by telephone and the availability of in person appointments. I also discussed with the patient that there may be a patient responsible charge related to this service. The patient expressed understanding and agreed to proceed.   History of Present Illness: Patient was evaluated through phone session.  He has not started lithium because due to pandemic coronavirus he was unable to go to pharmacy.  However he like to start the medication because he continues to feel sad depressed and then endorsed fatigue, lack of attention and concentration.  He still have chronic nightmares and flashback.  He denies any crying spells or any feeling of hopelessness.  He is not engaged in any self abusive behavior.  We have recommended psychological testing to rule out ADD however he has unable to get appointment.  He really like to try medicine to help his ADD.  He used to take Vyvanse prescribed by Dr. Rene KocherEksir but could not afford and decided to stop.  We have discussion in the past that stimulant may cause worsening of his mood, anxiety and insomnia.  He is taking Seroquel 100 mg 3 times a day and 300 mg at bedtime and Klonopin 1 mg 3 times a day.  He denies any paranoia, hallucination, suicidal thoughts.  He lives by himself.  He is anxious because he is staying home and afraid to go outside.  He had GeneSight testing and I discussed the results with him.  Most of his medication in his favorable section.  He denies drinking or using any illegal substances.  He lives by himself.  Past Psychiatric History: Reviewed. H/O cutting his wrist, overdose, poor compliant with medication and multiple hospitalization.  [facility]. [place] states. Tried Abilify, Ambien, Tegretol, Thorazine, Klonopin, Luvox, gabapentin, Vistaril, Remeron, prazosin, Risperdal, Seroquel, Vyvanse, trazodone and Lamictal.  Diagnosed with bipolar, borderline personality disorder, PTSD, ADD and major depressive disorder.  Observations/Objective: Limited mental status examination done on the phone.  Patient described his mood fair.  His speech is slow, decrease volume and tone.  He admitted chronic depression but denies any hallucination, paranoia or any suicidal thoughts.  He denies any delusions or homicidal thoughts.  His attention and concentration is fair.  He is alert and oriented x3.  His cognition is intact.  His fund of knowledge is okay.  His insight judgment is okay.  Assessment and Plan: Bipolar disorder type I.  Major depressive disorder,.  PTSD.  Rule out attention deficit disorder by history predominantly inattentive type.  Cluster B traits.  I discussed gene site testing results with him.  Patient insist to try medication to help his focus and attention.  We discussed stimulant may cause worsening of anxiety and insomnia.  We will try Strattera 25 mg to help his focus.  He like to try lithium to help his chronic depression which he has not picked up from the pharmacy.  I will continue Seroquel 100 mg 3 times a day and 300 mg at bedtime, Klonopin 1 mg 3 times a day.  Discussed medication side effects especially benzodiazepine dependence tolerance and withdrawal.  Discussed lithium side effects.  Patient is not interested  in therapy at this time.  I recommended to call us back if is any question or any concern.  Follow-up in 6 weeks.  Follow Up Instructions:    I discussed the assessment and treatment plan with the patient. The patient was provided an opportunity to ask questions and all were answered. The patient agreed with the plan and demonstrated an understanding of the  instructions.   The patient was advised to call back or seek an in-person evaluation if the symptoms worsen or if the condition fails to improve as anticipated.  I provided 20 minutes of non-face-to-face time during this encounter.   Cleotis Nipper, MD

## 2018-07-29 ENCOUNTER — Telehealth (HOSPITAL_COMMUNITY): Payer: Self-pay

## 2018-07-29 NOTE — Telephone Encounter (Signed)
Patient states he stopped the Strattera two days ago, he states that he was getting headaches and diarrhea. He said that after taking it for about 5 days and just really wanted to kill himself. Patient stopped the medication immediately. He states that he can not take it and would like a different ADHD medication.

## 2018-07-29 NOTE — Telephone Encounter (Signed)
He needs to stop Strattera. I gave him non-stimulant ADD medication and defer stimulants because it can cause worsening of symptoms. We have to wait for psychological testing. Please call and refer him to get an appointment for psychological testing.

## 2018-08-14 ENCOUNTER — Telehealth (HOSPITAL_COMMUNITY): Payer: Self-pay

## 2018-08-14 NOTE — Telephone Encounter (Signed)
Medication management - Telephone call with patient after discussing with Dr. Adele Schilder patient's request to change to another provider.  Patient reported liking Dr. Adele Schilder but felt he could better work with another provider as Dr. Adele Schilder was not patient's original provider for this practice and picked him up when patient's primary psychiatrist left the practice.  Dr. Adele Schilder was okay with patient changing to someone that may could spend more time with patient and requests next provider read his notes to familiarize themselves with patients' recent care.  Called patient to inform Dr. Adele Schilder was okay with his request and will have patient set up with another provider at [facility]. Met with Dr. Casimiro Needle to discuss patient's request and patient to be set up to see Dr. Casimiro Needle on 08/28/18.

## 2018-08-20 ENCOUNTER — Ambulatory Visit (HOSPITAL_COMMUNITY): Payer: Medicare Other | Admitting: Psychiatry

## 2018-08-27 ENCOUNTER — Telehealth (HOSPITAL_COMMUNITY): Payer: Self-pay | Admitting: Psychiatry

## 2018-08-28 ENCOUNTER — Ambulatory Visit (INDEPENDENT_AMBULATORY_CARE_PROVIDER_SITE_OTHER): Payer: Medicare Other | Admitting: Psychiatry

## 2018-08-28 ENCOUNTER — Other Ambulatory Visit: Payer: Self-pay

## 2018-08-28 DIAGNOSIS — F3160 Bipolar disorder, current episode mixed, unspecified: Secondary | ICD-10-CM | POA: Diagnosis not present

## 2018-08-28 DIAGNOSIS — F319 Bipolar disorder, unspecified: Secondary | ICD-10-CM

## 2018-08-28 DIAGNOSIS — F332 Major depressive disorder, recurrent severe without psychotic features: Secondary | ICD-10-CM | POA: Diagnosis not present

## 2018-08-28 MED ORDER — LITHIUM CARBONATE ER 300 MG PO TBCR: 300.0000 mg | EXTENDED_RELEASE_TABLET | Freq: Two times a day (BID) | ORAL | 1 refills | Status: DC

## 2018-08-28 MED ORDER — QUETIAPINE FUMARATE 300 MG PO TABS: 300.0000 mg | ORAL_TABLET | Freq: Every day | ORAL | 0 refills | Status: DC

## 2018-08-28 MED ORDER — QUETIAPINE FUMARATE 100 MG PO TABS: 100.0000 mg | ORAL_TABLET | Freq: Three times a day (TID) | ORAL | 0 refills | Status: DC

## 2018-08-28 MED ORDER — CLONAZEPAM 1 MG PO TABS: ORAL_TABLET | ORAL | 2 refills | Status: DC

## 2018-08-28 NOTE — Progress Notes (Signed)
Virtual Visit via Telephone Note  Today this patient was interviewed in the office.  This is the first visit of a diagnostic evaluation for this 48 year old white male with an extensive complex chronic mental illness.  The patient is divorced he has 1 daughter.  Is been under the care of different psychiatrists in the setting.  He has had extensive psychiatric history and has attempted suicide over 10 times according to him.  He has been hospitalized over a dozen times.  He has been on multiple psychotropic medications.  He says his diagnosis in the chart seems to confirm this is that his major depression, some form of bipolar disorder and posttraumatic stress disorder.  His medications at this time include Klonopin 1 mg 3 times daily, lithium and Seroquel 600 mg a day.  He says attempts to discontinue or lower the Seroquel has led to a decline.  He says he is been on very high doses of benzodiazepines and is got down to Klonopin 1 mg 3 times daily and actually has a wish to come off of it.  He is scared.  His previous psychiatrist began lithium but there is no documented blood levels.  The patient was started on Strattera but after a week became suicidal and discontinued it.  Patient denies being depressed at this time.  His appetite is very good that he is gaining weight.  He is concerned about his weight gain and can correlate it with the Seroquel that he is been on for 2 years.  The patient is very anhedonic.  He does not enjoy very much at all.  He lives [address] [place] by himself.  The patient clearly has features of PTSD and apparently did witness the death.  Is been in the [employer].  He had 3 tors.  He apparently did observe someone dying.  Is not clear his exact involvement.  Today the patient denies being suicidal.  He denies overt psychosis at this time.  We discussed how this was a short visit and that I would need more diagnostic time with him.  History of Present Illness: .  Past Psychiatric History:  Reviewed. H/O cutting his wrist, overdose, poor compliant with medication and multiple hospitalization.  Admitted 3 times at [facility] since 2015. H/O inpatient in [place], [place] and other states. Tried Abilify, Ambien, Tegretol, Thorazine, Klonopin, Luvox, gabapentin, Vistaril, Remeron, prazosin, Risperdal, Seroquel, Vyvanse, trazodone and Lamictal.  Diagnosed with bipolar, borderline personality disorder, PTSD, ADD and major depressive disorder.  Observations/Objective: Limited mental status examination done on the phone.  Patient described his mood fair.  His speech is slow, decrease volume and tone.  He admitted chronic depression but denies any hallucination, paranoia or any suicidal thoughts.  He denies any delusions or homicidal thoughts.  His attention and concentration is fair.  He is alert and oriented x3.  His cognition is intact.  His fund of knowledge is okay.  His insight judgment is okay.  Assessment and Plan:  Bipolar disorder, posttraumatic stress disorder.  Today the patient is asking for a stimulant as he has had in the past that he says was helpful.  Today he was refused.  I think a stimulant in this individual would be dangerous.  Note is the patient is off Strattera.  Today we will go ahead and asked his wishes to slowly reduce his Klonopin.  Will take a 1 mg pill taking a half in the morning 1 midday and 1 at night.  After 2 weeks he will  reduce it to a half in the morning half midday and 1 at night until he seen approximately 4 weeks after that.  We will get some lithium blood work and other blood work next week.. This patient is not suicidal at this time.  He actually denies being persistently depressed.  It is hard to make a clear diagnosis on this individual.  What is significant about this patient is that he has had multiple psychiatric hospitalizations.  Some multiple psychiatric hospitalization and has been suicidal in the past.  Also with significant is he went to the [facility] but  he felt they did not respect him.  Patient is here for another opinion.  It is very clear that he has to continue taking Seroquel and that we will get some blood work.  We will go ahead and start reducing his Klonopin.  Patient given a return appointment in 6 weeks.  Follow Up Instructions:    I discussed the assessment and treatment plan with the patient. The patient was provided an opportunity to ask questions and all were answered. The patient agreed with the plan and demonstrated an understanding of the instructions.   The patient was advised to call back or seek an in-person evaluation if the symptoms worsen or if the condition fails to improve as anticipated.  I provided 20 minutes of non-face-to-face time during this encounter.   Gypsy BalsamGerald I Ramey Schiff, MD

## 2018-09-02 ENCOUNTER — Ambulatory Visit (HOSPITAL_COMMUNITY): Payer: Medicare Other

## 2018-10-16 ENCOUNTER — Other Ambulatory Visit: Payer: Self-pay

## 2018-10-16 ENCOUNTER — Ambulatory Visit (INDEPENDENT_AMBULATORY_CARE_PROVIDER_SITE_OTHER): Payer: Medicare Other | Admitting: Psychiatry

## 2018-10-16 DIAGNOSIS — F319 Bipolar disorder, unspecified: Secondary | ICD-10-CM | POA: Diagnosis not present

## 2018-10-16 DIAGNOSIS — F4310 Post-traumatic stress disorder, unspecified: Secondary | ICD-10-CM | POA: Diagnosis not present

## 2018-10-16 DIAGNOSIS — F332 Major depressive disorder, recurrent severe without psychotic features: Secondary | ICD-10-CM

## 2018-10-16 MED ORDER — QUETIAPINE FUMARATE 100 MG PO TABS: 100.0000 mg | ORAL_TABLET | Freq: Three times a day (TID) | ORAL | 0 refills | Status: DC

## 2018-10-16 MED ORDER — LITHIUM CARBONATE ER 300 MG PO TBCR: 300.0000 mg | EXTENDED_RELEASE_TABLET | Freq: Two times a day (BID) | ORAL | 1 refills | Status: DC

## 2018-10-16 MED ORDER — CLONAZEPAM 1 MG PO TABS: ORAL_TABLET | ORAL | 3 refills | Status: DC

## 2018-10-16 NOTE — Progress Notes (Signed)
Virtual Visit via Telephone Note  Today this patient seems to be relatively stable.  It turns out he never got his blood work.  We will get it next week which will include a comprehensive metabolic panel, lithium level and a TSH.  The patient seems to be relatively stable in terms of his mood state.  [place].  He does not describe any social contacts at all.  He himself is successfully cut down his Klonopin now down to 2.5 mg in the morning 0.5 midday and 1 mg at night.  We have chosen not to change at all at this point given the viral pandemic.  The patient says he is not very cognitively productive but overall he is not suicidal and his mood is even.  He is eating well.  His energy level is fairly good.  Return to see me in 2 months for a more thorough reevaluation.  History of Present Illness: .  Past Psychiatric History: Reviewed. H/O cutting his wrist, overdose, poor compliant with medication and multiple hospitalization.  Admitted 3 times at [facility] since 2015. H/O inpatient in [place], [place] and other states. Tried Abilify, Ambien, Tegretol, Thorazine, Klonopin, Luvox, gabapentin, Vistaril, Remeron, prazosin, Risperdal, Seroquel, Vyvanse, trazodone and Lamictal.  Diagnosed with bipolar, borderline personality disorder, PTSD, ADD and major depressive disorder.  Observations/Objective: Limited mental status examination done on the phone.  Patient described his mood fair.  His speech is slow, decrease volume and tone.  He admitted chronic depression but denies any hallucination, paranoia or any suicidal thoughts.  He denies any delusions or homicidal thoughts.  His attention and concentration is fair.  He is alert and oriented x3.  His cognition is intact.  His fund of knowledge is okay.  His insight judgment is okay.  Assessment and Plan:  This diagnostic process will continue over the next few months.  The patient apparently is very suspicious and paranoid.  He  is having a hard time forming relationships.  Right now seems he is connecting to some degree with me.  We will clarify what his lithium level is and the rest of his blood work.  He has had difficulty time coming off Seroquel so we will not change that.  We will attempt next time to get better understanding target symptoms which may be simply paranoia.  He no longer is asking me for stimulants.  He will continue taking Klonopin at the present dosage.  I have no evidence that he is abusing any drugs alcohol or my medications.  This patient to return to see me in 2 months.   Jerral Ralph, MD

## 2018-11-29 ENCOUNTER — Other Ambulatory Visit (HOSPITAL_COMMUNITY): Payer: Self-pay | Admitting: Psychiatry

## 2018-11-29 DIAGNOSIS — F319 Bipolar disorder, unspecified: Secondary | ICD-10-CM

## 2018-12-10 ENCOUNTER — Other Ambulatory Visit (HOSPITAL_COMMUNITY): Payer: Self-pay

## 2018-12-10 DIAGNOSIS — F319 Bipolar disorder, unspecified: Secondary | ICD-10-CM

## 2018-12-10 MED ORDER — QUETIAPINE FUMARATE 300 MG PO TABS: 300.0000 mg | ORAL_TABLET | Freq: Every day | ORAL | 0 refills | Status: DC

## 2018-12-25 ENCOUNTER — Ambulatory Visit (INDEPENDENT_AMBULATORY_CARE_PROVIDER_SITE_OTHER): Payer: Medicare Other | Admitting: Psychiatry

## 2018-12-25 ENCOUNTER — Other Ambulatory Visit: Payer: Self-pay

## 2018-12-25 DIAGNOSIS — F332 Major depressive disorder, recurrent severe without psychotic features: Secondary | ICD-10-CM | POA: Diagnosis not present

## 2018-12-25 DIAGNOSIS — F259 Schizoaffective disorder, unspecified: Secondary | ICD-10-CM | POA: Diagnosis not present

## 2018-12-25 DIAGNOSIS — F319 Bipolar disorder, unspecified: Secondary | ICD-10-CM | POA: Diagnosis not present

## 2018-12-25 MED ORDER — CLONAZEPAM 1 MG PO TABS: ORAL_TABLET | ORAL | 5 refills | Status: DC

## 2018-12-25 MED ORDER — QUETIAPINE FUMARATE 300 MG PO TABS: 300.0000 mg | ORAL_TABLET | Freq: Every day | ORAL | 5 refills | Status: DC

## 2018-12-25 MED ORDER — QUETIAPINE FUMARATE 100 MG PO TABS: 100.0000 mg | ORAL_TABLET | Freq: Three times a day (TID) | ORAL | 0 refills | Status: DC

## 2018-12-25 MED ORDER — LITHIUM CARBONATE ER 300 MG PO TBCR: 300.0000 mg | EXTENDED_RELEASE_TABLET | Freq: Two times a day (BID) | ORAL | 5 refills | Status: DC

## 2018-12-25 NOTE — Progress Notes (Signed)
Virtual Visit via Telephone Note  Overall in today's interview the patient is stable.  He lives a very isolated lifestyle.  [place] the [place] were [place].  He is surrounded by birds squirrels rabbits and other animals.  He has minimal contact with any human beings.  He is not dangerous to himself or to anyone else.  He simply feels like he wants to be alone and feels safe.  He does admit to being around people.  On the other hand he recently is adopted a kitten his name is Tammi Klippel.  Patient is taking good care of the kitten.  The patient shows no overt evidence of psychosis.  He drinks no alcohol uses no drugs.  He takes his medicines as prescribed.  Is been hesitant to come in to get a lithium level because he is fearful of being in a medical environment.  He is fearful of the viral infection.  He drinks no alcohol and uses no drugs.  I think he is moderately paranoid.  His mood seems to be relatively stable. History of Present Illness: .  Past Psychiatric History: Reviewed. H/O cutting his wrist, overdose, poor compliant with medication and multiple hospitalization.  Admitted 3 times at [facility] since 2015. H/O inpatient in [place], [place] and other states. Tried Abilify, Ambien, Tegretol, Thorazine, Klonopin, Luvox, gabapentin, Vistaril, Remeron, prazosin, Risperdal, Seroquel, Vyvanse, trazodone and Lamictal.  Diagnosed with bipolar, borderline personality disorder, PTSD, ADD and major depressive disorder.  Observations/Objective: Limited mental status examination done on the phone.  Patient described his mood fair.  His speech is slow, decrease volume and tone.  He admitted chronic depression but denies any hallucination, paranoia or any suicidal thoughts.  He denies any delusions or homicidal thoughts.  His attention and concentration is fair.  He is alert and oriented x3.  His cognition is intact.  His fund of knowledge is okay.  His insight judgment is  okay.  Assessment and Plan:  At this time the patient's diagnosis is most likely that of schizoaffective disorder.  His psychotic symptoms seem to be well controlled taking Seroquel 100 mg 3 times daily.  I think the addition of lithium is also helpful.  We will get to get a lithium blood level but that will happen over the next few months.  The patient will continue taking a moderate dose of Klonopin a total of 2 mg a day.  The patient is generally stable.  He is not suicidal.  He is functioning fairly well.  He has minimal contact with people.  He is not suicidal.   Jerral Ralph, MD

## 2019-02-05 ENCOUNTER — Other Ambulatory Visit (HOSPITAL_COMMUNITY): Payer: Self-pay

## 2019-02-05 DIAGNOSIS — F319 Bipolar disorder, unspecified: Secondary | ICD-10-CM

## 2019-02-05 DIAGNOSIS — F332 Major depressive disorder, recurrent severe without psychotic features: Secondary | ICD-10-CM

## 2019-02-05 MED ORDER — CLONAZEPAM 1 MG PO TABS: ORAL_TABLET | ORAL | 2 refills | Status: DC

## 2019-03-09 ENCOUNTER — Other Ambulatory Visit (HOSPITAL_COMMUNITY): Payer: Self-pay | Admitting: Psychiatry

## 2019-03-09 DIAGNOSIS — F319 Bipolar disorder, unspecified: Secondary | ICD-10-CM

## 2019-03-10 ENCOUNTER — Other Ambulatory Visit (HOSPITAL_COMMUNITY): Payer: Self-pay | Admitting: *Deleted

## 2019-03-10 ENCOUNTER — Telehealth (HOSPITAL_COMMUNITY): Payer: Self-pay | Admitting: *Deleted

## 2019-03-10 DIAGNOSIS — F319 Bipolar disorder, unspecified: Secondary | ICD-10-CM

## 2019-03-10 MED ORDER — QUETIAPINE FUMARATE 300 MG PO TABS: 300.0000 mg | ORAL_TABLET | Freq: Every day | ORAL | 5 refills | Status: DC

## 2019-03-10 NOTE — Telephone Encounter (Signed)
Received refill request for Seroquel 300mg . Pharmacy did not receive refills. Refills sent to [facility] [address], [place].

## 2019-03-17 ENCOUNTER — Other Ambulatory Visit (HOSPITAL_COMMUNITY): Payer: Self-pay | Admitting: Psychiatry

## 2019-03-17 DIAGNOSIS — F319 Bipolar disorder, unspecified: Secondary | ICD-10-CM

## 2019-03-26 ENCOUNTER — Other Ambulatory Visit (HOSPITAL_COMMUNITY): Payer: Self-pay | Admitting: *Deleted

## 2019-03-26 DIAGNOSIS — F319 Bipolar disorder, unspecified: Secondary | ICD-10-CM

## 2019-03-26 MED ORDER — QUETIAPINE FUMARATE 100 MG PO TABS: 100.0000 mg | ORAL_TABLET | Freq: Three times a day (TID) | ORAL | 0 refills | Status: DC

## 2019-04-02 ENCOUNTER — Ambulatory Visit (INDEPENDENT_AMBULATORY_CARE_PROVIDER_SITE_OTHER): Payer: Medicare Other | Admitting: Psychiatry

## 2019-04-02 ENCOUNTER — Other Ambulatory Visit: Payer: Self-pay

## 2019-04-02 ENCOUNTER — Other Ambulatory Visit (HOSPITAL_COMMUNITY): Payer: Self-pay | Admitting: *Deleted

## 2019-04-02 DIAGNOSIS — F332 Major depressive disorder, recurrent severe without psychotic features: Secondary | ICD-10-CM

## 2019-04-02 DIAGNOSIS — F319 Bipolar disorder, unspecified: Secondary | ICD-10-CM

## 2019-04-02 DIAGNOSIS — F251 Schizoaffective disorder, depressive type: Secondary | ICD-10-CM | POA: Diagnosis not present

## 2019-04-02 MED ORDER — CLONAZEPAM 1 MG PO TABS: ORAL_TABLET | ORAL | 2 refills | Status: DC

## 2019-04-02 MED ORDER — QUETIAPINE FUMARATE 100 MG PO TABS: 100.0000 mg | ORAL_TABLET | Freq: Three times a day (TID) | ORAL | 0 refills | Status: DC

## 2019-04-02 MED ORDER — LITHIUM CARBONATE ER 300 MG PO TBCR: 300.0000 mg | EXTENDED_RELEASE_TABLET | Freq: Two times a day (BID) | ORAL | 5 refills | Status: DC

## 2019-04-02 NOTE — Progress Notes (Signed)
Virtual Visit via Telephone Note  Today's visit was done by the phone. As best I can tell this patient is stable is at his baseline. His baseline is that he is isolated. It [place] and happen. His overall since. He talks to no one. His one friend he talks to every few months. Is not suicidal nor is she homicidal. He does experiencing she episodes of being paranoid. This is to the point where his call the police and changed his bank accounts. He shared with me that he doesn't make any sense. Seems be shortness and insight it is every acting and that he uses were that he is paranoid. I shared the patient is no one to talk to and discuss his reality. Doesn't really describe any delusional material or hallucinations but is hard for him to get a grip of reality because she has no one to share things with. Is extremely lonely and isolated. Noted is that he owns no handguns. He has to hunting rifles that he never uses. The only thing he does have is that he owns a cat named Darin Engels. Today we talked about the importance of having a therapist to talk to. He surprisingly agreed and was very interested. He says that since we increased his Klonopin to 3 mg in the last few months is actually been helpful. Is able tolerate himself being alone. Is not oversedated. He goes to bed about 8 or 9 a clock at night and sleeps only 2 to recovery. He does watch television no longer reads. .  Past Psychiatric History: Reviewed. H/O cutting his wrist, overdose, poor compliant with medication and multiple hospitalization.  Admitted 3 times at [facility] since 2015. H/O inpatient in [place], [place] and other states. Tried Abilify, Ambien, Tegretol, Thorazine, Klonopin, Luvox, gabapentin, Vistaril, Remeron, prazosin, Risperdal, Seroquel, Vyvanse, trazodone and Lamictal.  Diagnosed with bipolar, borderline personality disorder, PTSD, ADD and major depressive disorder.  Observations/Objective: Limited mental status examination  done on the phone.  Patient described his mood fair.  His speech is slow, decrease volume and tone.  He admitted chronic depression but denies any hallucination, paranoia or any suicidal thoughts.  He denies any delusions or homicidal thoughts.  His attention and concentration is fair.  He is alert and oriented x3.  His cognition is intact.  His fund of knowledge is okay.  His insight judgment is okay.  Assessment and Plan:  At this time as patient's diagnosis is mostly that of schizoaffective disorder. At this time we'll continue taking Seroquel 300 mg, lithium as prescribed and Klonopin 1 mg 3 a day. The patient is intolerant to see anyone. Today he did accept having a therapist. He was given the name of Onalee Hua plank who I spoke to today. This therapist is willing to call [name] the patient at his home. He gave me permission to have a therapist call him. Her likely begin some form of therapy over the phone. Patient again is not suicidal. Is very isolated but is actually functioning. He makes his own meals and have somebody deliver food to him. I will reevaluate him in 2 months.  Gypsy Balsam, MD

## 2019-05-15 ENCOUNTER — Telehealth (HOSPITAL_COMMUNITY): Payer: Self-pay

## 2019-05-15 NOTE — Telephone Encounter (Signed)
Medication management - message left for patient that all medications were sent in on 04/02/19 to his [facility] and requested he call back if he had one that he was having problems filling.

## 2019-06-11 ENCOUNTER — Other Ambulatory Visit: Payer: Self-pay

## 2019-06-11 ENCOUNTER — Ambulatory Visit (INDEPENDENT_AMBULATORY_CARE_PROVIDER_SITE_OTHER): Payer: Medicare Other | Admitting: Psychiatry

## 2019-06-11 DIAGNOSIS — F319 Bipolar disorder, unspecified: Secondary | ICD-10-CM

## 2019-06-11 DIAGNOSIS — F603 Borderline personality disorder: Secondary | ICD-10-CM

## 2019-06-11 DIAGNOSIS — F988 Other specified behavioral and emotional disorders with onset usually occurring in childhood and adolescence: Secondary | ICD-10-CM

## 2019-06-11 DIAGNOSIS — F259 Schizoaffective disorder, unspecified: Secondary | ICD-10-CM

## 2019-06-11 DIAGNOSIS — F250 Schizoaffective disorder, bipolar type: Secondary | ICD-10-CM

## 2019-06-11 DIAGNOSIS — F4310 Post-traumatic stress disorder, unspecified: Secondary | ICD-10-CM

## 2019-06-11 DIAGNOSIS — Z915 Personal history of self-harm: Secondary | ICD-10-CM

## 2019-06-11 DIAGNOSIS — F329 Major depressive disorder, single episode, unspecified: Secondary | ICD-10-CM

## 2019-06-11 DIAGNOSIS — F332 Major depressive disorder, recurrent severe without psychotic features: Secondary | ICD-10-CM

## 2019-06-11 MED ORDER — CLONAZEPAM 1 MG PO TABS: ORAL_TABLET | ORAL | 3 refills | Status: DC

## 2019-06-11 MED ORDER — QUETIAPINE FUMARATE 100 MG PO TABS: 100.0000 mg | ORAL_TABLET | Freq: Three times a day (TID) | ORAL | 0 refills | Status: DC

## 2019-06-11 MED ORDER — CLONAZEPAM 1 MG PO TABS: ORAL_TABLET | ORAL | 2 refills | Status: DC

## 2019-06-11 MED ORDER — LITHIUM CARBONATE ER 300 MG PO TBCR: 300.0000 mg | EXTENDED_RELEASE_TABLET | Freq: Two times a day (BID) | ORAL | 2 refills | Status: DC

## 2019-06-11 MED ORDER — QUETIAPINE FUMARATE 100 MG PO TABS: 100.0000 mg | ORAL_TABLET | Freq: Three times a day (TID) | ORAL | 1 refills | Status: DC

## 2019-06-11 MED ORDER — LITHIUM CARBONATE ER 300 MG PO TBCR: 300.0000 mg | EXTENDED_RELEASE_TABLET | Freq: Two times a day (BID) | ORAL | 5 refills | Status: DC

## 2019-06-11 NOTE — Progress Notes (Signed)
Virtual Visit via Telephone Note  Today the patient is doing fair.  This is his baseline.  He still very isolated.  He did make contact with Onalee Hua the therapist but unfortunately Onalee Hua did not take his insurance.  The patient continues in a lot of isolation.  His cat Darin Engels is doing okay.  Patient has 2 people that he talks with.  He has no overt evidence of psychosis.  He is lucid in our discussions.  He denies being bored.  He likes being alone.  He is very isolated and sedentary.  He is frightened of the television he is change his phone to a different phone number.  The patient still seems to be engageable with me.  He does describe depression but it is moderate and continuous.  He is not suicidal.  He is not homicidal.  He is just very guarded.  Past Psychiatric History: Reviewed. H/O cutting his wrist, overdose, poor compliant with medication and multiple hospitalization.  Admitted 3 times at [facility] since 2015. H/O inpatient in [place], [place] and other states. Tried Abilify, Ambien, Tegretol, Thorazine, Klonopin, Luvox, gabapentin, Vistaril, Remeron, prazosin, Risperdal, Seroquel, Vyvanse, trazodone and Lamictal.  Diagnosed with bipolar, borderline personality disorder, PTSD, ADD and major depressive disorder.  Observations/Objective: Limited mental status examination done on the phone.  Patient described his mood fair.  His speech is slow, decrease volume and tone.  He admitted chronic depression but denies any hallucination, paranoia or any suicidal thoughts.  He denies any delusions or homicidal thoughts.  His attention and concentration is fair.  He is alert and oriented x3.  His cognition is intact.  His fund of knowledge is okay.  His insight judgment is okay.  Assessment and Plan:  Today the patient is being treated for schizoaffective disorder.  He will continue taking lithium 300 mg twice daily continue taking Seroquel 100 mg 3 times daily and continue Klonopin 1 mg 3 times daily.   His mood is relatively stable.  He is not irritable.  He certainly is not euphoric.  It seems that these 2 medicines keep him in a stable state without over sedating him.  He denies any physical complaints.  He believes the coronavirus is not true.  Nonetheless he never comes off of his ranch.  He is agreed to continue seeing me at least by phone and to return in 3 months.

## 2019-07-04 ENCOUNTER — Other Ambulatory Visit (HOSPITAL_COMMUNITY): Payer: Self-pay | Admitting: Psychiatry

## 2019-07-04 DIAGNOSIS — F319 Bipolar disorder, unspecified: Secondary | ICD-10-CM

## 2019-07-11 ENCOUNTER — Other Ambulatory Visit: Payer: Self-pay

## 2019-07-11 ENCOUNTER — Emergency Department (HOSPITAL_COMMUNITY)
Admission: EM | Admit: 2019-07-11 | Discharge: 2019-07-11 | Disposition: A | Discharge status: Home / Self Care | Payer: Medicare Other | Attending: Emergency Medicine | Admitting: Emergency Medicine

## 2019-07-11 ENCOUNTER — Encounter (HOSPITAL_COMMUNITY): Payer: Self-pay | Admitting: Emergency Medicine

## 2019-07-11 ENCOUNTER — Emergency Department (HOSPITAL_COMMUNITY): Payer: Medicare Other

## 2019-07-11 DIAGNOSIS — F17210 Nicotine dependence, cigarettes, uncomplicated: Secondary | ICD-10-CM | POA: Diagnosis not present

## 2019-07-11 DIAGNOSIS — Z79899 Other long term (current) drug therapy: Secondary | ICD-10-CM | POA: Diagnosis not present

## 2019-07-11 DIAGNOSIS — R0789 Other chest pain: Secondary | ICD-10-CM | POA: Diagnosis not present

## 2019-07-11 DIAGNOSIS — R079 Chest pain, unspecified: Secondary | ICD-10-CM | POA: Diagnosis not present

## 2019-07-11 LAB — CBC
HCT: 42.3 % (ref 39.0–52.0)
Hemoglobin: 14.3 g/dL (ref 13.0–17.0)
MCH: 32.6 pg (ref 26.0–34.0)
MCHC: 33.8 g/dL (ref 30.0–36.0)
MCV: 96.4 fL (ref 80.0–100.0)
Platelets: 220 10*3/uL (ref 150–400)
RBC: 4.39 MIL/uL (ref 4.22–5.81)
RDW: 12.1 % (ref 11.5–15.5)
WBC: 6.1 10*3/uL (ref 4.0–10.5)
nRBC: 0 % (ref 0.0–0.2)

## 2019-07-11 LAB — BASIC METABOLIC PANEL
Anion gap: 12 (ref 5–15)
BUN: 19 mg/dL (ref 6–20)
CO2: 25 mmol/L (ref 22–32)
Calcium: 9.1 mg/dL (ref 8.9–10.3)
Chloride: 103 mmol/L (ref 98–111)
Creatinine, Ser: 0.97 mg/dL (ref 0.61–1.24)
GFR calc Af Amer: >60 mL/min (ref >60)
GFR calc non Af Amer: >60 mL/min (ref >60)
Glucose, Bld: 139 mg/dL — ABNORMAL HIGH (ref 70–99)
Potassium: 3.6 mmol/L (ref 3.5–5.1)
Sodium: 140 mmol/L (ref 135–145)

## 2019-07-11 LAB — TROPONIN I (HIGH SENSITIVITY)
Troponin I (High Sensitivity): 5 ng/L (ref <18)
Troponin I (High Sensitivity): <2 ng/L (ref <18)

## 2019-07-11 MED ORDER — PANTOPRAZOLE SODIUM 40 MG PO TBEC
40.0000 mg | DELAYED_RELEASE_TABLET | Freq: Every day | ORAL | Status: DC
  Administered 2019-07-11: 40 mg via ORAL
  Filled 2019-07-11: qty 1

## 2019-07-11 NOTE — Discharge Instructions (Signed)
Please follow up with your GI specialist for further evaluation of your discomfort.  You may benefit from an endoscopy if indicated.  Take prilosec 30 minutes before each major meal for the next 2 weeks.  Avoid tobacco use.  Return if you have any concerns.  Your labs are normal today.  Your chest xray is normal as well.

## 2019-07-11 NOTE — ED Triage Notes (Signed)
Patient c/o central chest pain/ sternal pain onset of Tuesday. States this is the 4th time he has felt this pain in 2 years. Reports a lump on her sternum that is very painful to touch and if pressed with take his breath away. Reports smoking 1 cigarette daily since Friday, trying to quit.

## 2019-07-11 NOTE — ED Provider Notes (Addendum)
[facility] EMERGENCY DEPARTMENT Provider Note   CSN: [id number] Arrival date & time: 07/11/19  4818     History Chief Complaint  Patient presents with  . Chest Pain    Phillip [name] is a 49 y.o. male.  The history is provided by the patient. No language interpreter was used.  Chest Pain    49 year old male with history of bipolar, anxiety, depression, PTSD, presenting complaint chest pain.  Patient report for the past 2 years he has been experiencing intermittent episodes of midsternal chest discomfort.  He states episodes sometimes brought on at nighttime which wakes him up, complaining of sharp pain along his sternum, that would last for 4 to 5 days and resolved.  He endorsed recalling the first episode after he drank orange juice.  States the pain became very uncomfortable, keeping him up at night, and would last for several days.  Each time that happen, he became very concerned.  It has happened approximately 4 separate episodes within the past 2 years with most recent episode a few days ago waking him up at night.  He has since tried to adjust his diet and become vegetarian for the past 5 months, as well as size to significantly decrease his tobacco consumption to 1 to 2 cigarettes a day for the past 5 days.  He is concerned and would like to be evaluated.  He denies any associated dizziness, diaphoresis, nausea, or shortness of breath with these episodes.  He did take several Toradol yesterday that was given to him by his wife for his discomfort with some improvement.  He denies any exertional chest pain.  He report a constant pain at the base of his sternum only present when he pushed on it but not otherwise.  He denies any fever chills productive cough nauseous vomiting or skin rash.  Denies any injury to his chest.  Has history of heartburn in the past in which he takes Prilosec.  He does have a PCP.  He report his pain was more intense earlier today but has  since improved.  He denies alcohol abuse.  Past Medical History:  Diagnosis Date  . Anxiety   . Bipolar disorder (HCC)   . Depression   . PTSD (post-traumatic stress disorder)     Patient Active Problem List   Diagnosis Date Noted  . Borderline personality disorder in adult [facility]) 08/16/2016  . Bipolar I disorder (HCC) 07/06/2016  . Borderline personality disorder (HCC) 07/06/2016  . 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 Major depressive disorder, recurrent, severe without psychotic features (HCC)   . PTSD (post-traumatic stress disorder) 01/29/2014    Past Surgical History:  Procedure Laterality Date  . cholesterol    . WISDOM TOOTH EXTRACTION         Family History  Problem Relation Age of Onset  . Cancer Mother   . Cancer Father   . Depression Father     Social History   Tobacco Use  . Smoking status: Current Every Day Smoker    Packs/day: 0.60    Years: 19.00    Pack years: 11.40    Types: Cigarettes  . Smokeless tobacco: Never Used  . Tobacco comment: Has cut back to 4 a day and trying to stop  Substance Use Topics  . Alcohol use: No  . Drug use: No    Types: Marijuana    Comment: seldom    Home Medications Prior to Admission medications   Medication Sig Start  Date End Date Taking? Authorizing Provider  clonazePAM (KLONOPIN) 1 MG tablet Take one tablet po tid 06/11/19   Plovsky, Berneta Sages, MD  lithium carbonate (LITHOBID) 300 MG CR tablet Take 1 tablet (300 mg total) by mouth 2 (two) times daily. 06/11/19   Plovsky, Berneta Sages, MD  QUEtiapine (SEROQUEL) 100 MG tablet Take 1 tablet (100 mg total) by mouth 3 (three) times daily. 06/11/19 06/10/20  Plovsky, Berneta Sages, MD  QUEtiapine (SEROQUEL) 100 MG tablet Take 1 tablet (100 mg total) by mouth 3 (three) times daily. 06/11/19 06/10/20  Norma Fredrickson, MD    Allergies    Vicodin [hydrocodone-acetaminophen], Mellaril [thioridazine], and Testosterone  Review of Systems   Review of Systems  Cardiovascular:  Positive for chest pain.  All other systems reviewed and are negative.   Physical Exam Updated Vital Signs BP 127/74   Pulse 78   Temp 97.9 F (36.6 C) (Oral)   Resp 18   Ht 5' 10.5" (1.791 m)   Wt 85.3 kg   SpO2 98%   BMI 26.59 kg/m   Physical Exam Vitals and nursing note reviewed.  Constitutional:      General: He is not in acute distress.    Appearance: He is well-developed.  HENT:     Head: Atraumatic.  Eyes:     Conjunctiva/sclera: Conjunctivae normal.  Cardiovascular:     Rate and Rhythm: Normal rate and regular rhythm.     Heart sounds: Normal heart sounds.  Pulmonary:     Effort: Pulmonary effort is normal.     Breath sounds: Normal breath sounds.  Chest:     Chest wall: Tenderness (Tenderness to the base of his sternum at the xiphoid process on palpation without any obvious deformity no bruising no edema no induration no fluctuance appreciated.  No crepitus.) present.  Abdominal:     Palpations: Abdomen is soft.     Tenderness: There is no abdominal tenderness.  Musculoskeletal:     Cervical back: Neck supple.  Skin:    Findings: No rash.  Neurological:     Mental Status: He is alert.     ED Results / Procedures / Treatments   Labs (all labs ordered are listed, but only abnormal results are displayed) Labs Reviewed  BASIC METABOLIC PANEL - Abnormal; Notable for the following components:      Result Value   Glucose, Bld 139 (*)    All other components within normal limits  CBC  TROPONIN I (HIGH SENSITIVITY)  TROPONIN I (HIGH SENSITIVITY)    EKG EKG Interpretation  Date/Time:  Friday July 11 2019 08:22:51 EDT Ventricular Rate:  81 PR Interval:  170 QRS Duration: 84 QT Interval:  388 QTC Calculation: 450 R Axis:   59 Text Interpretation: Normal sinus rhythm Cannot rule out Anterior infarct , age undetermined Abnormal ECG Confirmed by Quintella Reichert [phone]) on 07/11/2019 10:11:22 AM   Radiology DG Chest 2 View  Result Date:  07/11/2019 CLINICAL DATA:  49 year old male with history of intermittent shooting pains across the chest for the past 2 years. EXAM: CHEST - 2 VIEW COMPARISON:  Chest x-ray 11/23/2017. FINDINGS: Lung volumes are normal. No consolidative airspace disease. No pleural effusions. No pneumothorax. No pulmonary nodule or mass noted. Pulmonary vasculature and the cardiomediastinal silhouette are within normal limits. IMPRESSION: No radiographic evidence of acute cardiopulmonary disease. Electronically Signed   By: Vinnie Langton M.D.   On: 07/11/2019 10:02    Procedures Procedures (including critical care time)  Medications Ordered in ED Medications  pantoprazole (PROTONIX) EC tablet 40 mg (40 mg Oral Given 07/11/19 1203)    ED Course  I have reviewed the triage vital signs and the nursing notes.  Pertinent labs & imaging results that were available during my care of the patient were reviewed by me and considered in my medical decision making (see chart for details).    MDM Rules/Calculators/A&P                      BP 127/74   Pulse 78   Temp 97.9 F (36.6 C) (Oral)   Resp 18   Ht 5' 10.5" (1.791 m)   Wt 85.3 kg   SpO2 98%   BMI 26.59 kg/m  Final Clinical Impression(s) / ED Diagnoses Final diagnoses:  Atypical chest pain    Rx / DC Orders ED Discharge Orders    None     10:48 AM Patient here with episodic epigastric/sternal pain for the past 2 years.  Pain is atypical of ACS.  Suspect gastritis/GERD versus potential esophageal stricture since patient did report sometimes feeling sensation of food is getting stuck with no active vomiting.  Initial chest x-ray EKG and labs are reassuring.  Awaits delta troponin.  Anticipate discharge home with PPI.  12:09 PM Encourage pt to f/u with GI specialist for further care.  Return precaution discussed.    Fayrene Helper, PA-C 07/11/19 1209    Fayrene Helper, PA-C 07/11/19 1306    Tilden Fossa, MD 07/12/19 [phone]

## 2019-07-17 DIAGNOSIS — R45851 Suicidal ideations: Secondary | ICD-10-CM | POA: Diagnosis not present

## 2019-07-17 DIAGNOSIS — F17210 Nicotine dependence, cigarettes, uncomplicated: Secondary | ICD-10-CM | POA: Diagnosis not present

## 2019-07-17 DIAGNOSIS — F418 Other specified anxiety disorders: Secondary | ICD-10-CM | POA: Diagnosis not present

## 2019-07-18 ENCOUNTER — Encounter: Payer: Self-pay | Admitting: Psychiatry

## 2019-07-18 ENCOUNTER — Encounter (HOSPITAL_COMMUNITY): Payer: Self-pay | Admitting: Psychiatry

## 2019-07-18 ENCOUNTER — Inpatient Hospital Stay (HOSPITAL_COMMUNITY)
Admission: AD | Admit: 2019-07-18 | Discharge: 2019-07-24 | DRG: 885 | Disposition: A | Discharge status: Home / Self Care | Payer: Medicare Other | Source: Intra-hospital | Attending: Psychiatry | Admitting: Psychiatry

## 2019-07-18 ENCOUNTER — Other Ambulatory Visit: Payer: Self-pay

## 2019-07-18 DIAGNOSIS — F17210 Nicotine dependence, cigarettes, uncomplicated: Secondary | ICD-10-CM | POA: Diagnosis not present

## 2019-07-18 DIAGNOSIS — F314 Bipolar disorder, current episode depressed, severe, without psychotic features: Principal | ICD-10-CM | POA: Diagnosis present

## 2019-07-18 DIAGNOSIS — F1520 Other stimulant dependence, uncomplicated: Secondary | ICD-10-CM | POA: Diagnosis present

## 2019-07-18 DIAGNOSIS — F4310 Post-traumatic stress disorder, unspecified: Secondary | ICD-10-CM | POA: Diagnosis present

## 2019-07-18 DIAGNOSIS — Z915 Personal history of self-harm: Secondary | ICD-10-CM

## 2019-07-18 DIAGNOSIS — F418 Other specified anxiety disorders: Secondary | ICD-10-CM | POA: Diagnosis not present

## 2019-07-18 DIAGNOSIS — R45851 Suicidal ideations: Secondary | ICD-10-CM | POA: Diagnosis present

## 2019-07-18 DIAGNOSIS — Z818 Family history of other mental and behavioral disorders: Secondary | ICD-10-CM

## 2019-07-18 DIAGNOSIS — M1990 Unspecified osteoarthritis, unspecified site: Secondary | ICD-10-CM | POA: Diagnosis present

## 2019-07-18 DIAGNOSIS — G4700 Insomnia, unspecified: Secondary | ICD-10-CM | POA: Diagnosis present

## 2019-07-18 DIAGNOSIS — F259 Schizoaffective disorder, unspecified: Secondary | ICD-10-CM | POA: Diagnosis present

## 2019-07-18 DIAGNOSIS — Z9114 Patient's other noncompliance with medication regimen: Secondary | ICD-10-CM | POA: Diagnosis not present

## 2019-07-18 DIAGNOSIS — F250 Schizoaffective disorder, bipolar type: Secondary | ICD-10-CM | POA: Diagnosis not present

## 2019-07-18 MED ORDER — QUETIAPINE FUMARATE 100 MG PO TABS
100.0000 mg | ORAL_TABLET | Freq: Three times a day (TID) | ORAL | Status: DC
  Administered 2019-07-18: 100 mg via ORAL
  Filled 2019-07-18 (×7): qty 1

## 2019-07-18 MED ORDER — QUETIAPINE FUMARATE 100 MG PO TABS: 100.0000 mg | ORAL_TABLET | Freq: Three times a day (TID) | ORAL | Status: DC

## 2019-07-18 MED ORDER — MAGNESIUM HYDROXIDE 400 MG/5ML PO SUSP
30.0000 mL | Freq: Every day | ORAL | Status: DC | PRN
  Administered 2019-07-24: 30 mL via ORAL

## 2019-07-18 MED ORDER — ALUM & MAG HYDROXIDE-SIMETH 200-200-20 MG/5ML PO SUSP
30.0000 mL | ORAL | Status: DC | PRN
  Administered 2019-07-18 – 2019-07-24 (×3): 30 mL via ORAL

## 2019-07-18 MED ORDER — LITHIUM CARBONATE ER 300 MG PO TBCR
300.0000 mg | EXTENDED_RELEASE_TABLET | Freq: Two times a day (BID) | ORAL | Status: DC
  Administered 2019-07-18 – 2019-07-20 (×4): 300 mg via ORAL
  Filled 2019-07-18 (×10): qty 1

## 2019-07-18 NOTE — Progress Notes (Signed)
D: pt. Came in because of SI. Pt. Reported that he went to the woods with a shot gun, but God or someone, talked to him. Pt. Reports seeing things and hearing things. Pt. Admitted to depression, anxiety,insomnia, panic attacks, worrying, nervousness, loneliness, insomnia,disorientation and hopelessness. Pt.reported not going to the bathroom for days because there are cameras everywhere. Pt. Reported that he and his girlfriend broke up, but that it was mutual. Pt. Was tearful at times and expressed he wanted to get better. A:   Support and encouragement provided Routine safety checks conducted every 15 minutes. Patient  Informed to notify staff with any concerns.  Safety maintained. R:  Patient contracts for safety.  Patient tearful at times. Safety maintained.

## 2019-07-18 NOTE — Tx Team (Signed)
Initial Treatment Plan 07/18/2019 7:02 PM [name] GWG:[id number]    PATIENT STRESSORS: Loss of relationship Marital or family conflict   PATIENT STRENGTHS: Communication skills Motivation for treatment/growth Supportive family/friends   PATIENT IDENTIFIED PROBLEMS: Suicidial Ideation  Depression  Anxiety  Grief and Loss               DISCHARGE CRITERIA:  Ability to meet basic life and health needs Improved stabilization in mood, thinking, and/or behavior Motivation to continue treatment in a less acute level of care  PRELIMINARY DISCHARGE PLAN: Outpatient therapy Return to previous living arrangement  PATIENT/FAMILY INVOLVEMENT: This treatment plan has been presented to and reviewed with the patient, Phillip [name]. The patient has been given the opportunity to ask questions and make suggestions.  Garnette Scheuermann, RN 07/18/2019, 7:02 PM

## 2019-07-19 DIAGNOSIS — F250 Schizoaffective disorder, bipolar type: Secondary | ICD-10-CM

## 2019-07-19 LAB — LITHIUM LEVEL: Lithium Lvl: 0.43 mmol/L — ABNORMAL LOW (ref 0.60–1.20)

## 2019-07-19 LAB — RAPID URINE DRUG SCREEN, HOSP PERFORMED
Amphetamines: POSITIVE — AB
Barbiturates: NOT DETECTED
Benzodiazepines: NOT DETECTED
Cocaine: NOT DETECTED
Opiates: NOT DETECTED
Tetrahydrocannabinol: NOT DETECTED

## 2019-07-19 LAB — TSH: TSH: 0.552 u[IU]/mL (ref 0.350–4.500)

## 2019-07-19 MED ORDER — NICOTINE 21 MG/24HR TD PT24
MEDICATED_PATCH | TRANSDERMAL | Status: AC
  Administered 2019-07-19: 21 mg
  Filled 2019-07-19: qty 1

## 2019-07-19 MED ORDER — NICOTINE 21 MG/24HR TD PT24
21.0000 mg | MEDICATED_PATCH | Freq: Every day | TRANSDERMAL | Status: DC
  Administered 2019-07-19 – 2019-07-23 (×5): 21 mg via TRANSDERMAL
  Filled 2019-07-19 (×8): qty 1

## 2019-07-19 MED ORDER — QUETIAPINE FUMARATE 100 MG PO TABS
100.0000 mg | ORAL_TABLET | Freq: Every day | ORAL | Status: DC
  Administered 2019-07-19 – 2019-07-21 (×3): 100 mg via ORAL
  Filled 2019-07-19 (×5): qty 1

## 2019-07-19 MED ORDER — QUETIAPINE FUMARATE 300 MG PO TABS
300.0000 mg | ORAL_TABLET | Freq: Every day | ORAL | Status: DC
  Administered 2019-07-19: 300 mg via ORAL
  Filled 2019-07-19 (×3): qty 1

## 2019-07-19 MED ORDER — CLONAZEPAM 1 MG PO TABS
1.0000 mg | ORAL_TABLET | Freq: Three times a day (TID) | ORAL | Status: DC
  Administered 2019-07-19 – 2019-07-23 (×13): 1 mg via ORAL
  Filled 2019-07-19 (×13): qty 1

## 2019-07-19 NOTE — Plan of Care (Signed)
In room awake. Sad and depressed, anxious and restless. Endorsing paranoia: patient believes that his current situation is not real, that people are planning against him. Safety precautions reinforced.

## 2019-07-19 NOTE — Progress Notes (Signed)
Phillip [name] is currently in bed awake. He is anxious and depressed  With paranoic thinking "is this real ? Are you sure that people are not planning against me ?". Patient became tearful reporting that he is not sure what is going on, that things were planned against him. Patient was reassured and was encouraged to talk to staff as needed. Safety precautions reinforced.

## 2019-07-19 NOTE — Progress Notes (Signed)
Adult Psychoeducational Group Note  Date:  07/19/2019 Time:  9:32 PM  Group Topic/Focus:  Wrap-Up Group:   The focus of this group is to help patients review their daily goal of treatment and discuss progress on daily workbooks.  Participation Level:  Minimal  Participation Quality:  Appropriate  Affect:  Depressed, Flat, Irritable and Resistant  Cognitive:  Disorganized, Confused, Delusional and Lacking  Insight: Lacking and Limited  Engagement in Group:  Lacking, Limited, Off Topic, Poor and Resistant  Modes of Intervention:  Discussion  Additional Comments:  Pt stated his goal for today was to focus on his treatment plan and to talk with his Social worker about his aftercare treatment plan. Pt stated he felt he accomplished his goals today. Pt stated he felt his relationship with his family needs to be improved.  Pt rated his overall day on an 4 out of 10. Pt stated his appetite was pretty good today. Pt stated his slept last night was pretty good. Pt stated he was in physical pain. Pt stated he had pain in his hip. Writer asked pt to rate his pain. Pt rated it a 6. Pt nurse was made aware of the situation. Pt stated he could not remember how he hurt his hip. Pt stated I most have fell or something I can not remember. Pt deny auditory or visual hallucinations. Pt denies thoughts of harming himself or others. Pt stated that he would alert staff if anything changes.  Felipa Furnace 07/19/2019, 9:32 PM

## 2019-07-19 NOTE — BHH Suicide Risk Assessment (Signed)
[facility] Admission Suicide Risk Assessment   Nursing information obtained from:  Patient Demographic factors:  Male, Caucasian Current Mental Status:  Suicidal ideation indicated by patient Loss Factors:  Loss of significant relationship Historical Factors:  Prior suicide attempts Risk Reduction Factors:  Religious beliefs about death  Total Time spent with patient: 30 minutes Principal Problem: Bipolar affective disorder, depressed, severe ([place]) Diagnosis:  Principal Problem:   Bipolar affective disorder, depressed, severe ([place])  Subjective Data: Patient is seen and examined.  Patient is a 49 year old male with a past psychiatric history significant for schizoaffective disorder who originally presented to the [facility] emergency department on 07/17/2019 with suicidal ideation.  The patient stated at that time that he had worsening depression and worsening suicidal ideation.  He apparently spent the day prior to admission and somewhat an area with a weapon placed in his mouth.  He stated he was unable to pull the trigger, but continued to have suicidal ideation.  On examination today he is rather vague, and really does not give much of a history.  Reviewed the electronic medical record as well as the notes from [facility] were basically the only information that we had available.  He did admit that he has been noncompliant with his psychiatric medications.  He also admitted to auditory as well as visual hallucinations.  His last psychiatric outpatient follow-up was on 06/11/2019.  This was with Dr Casimiro Needle.  You of that visit stated that the patient remains in a great deal of isolation.  He was lucid during the interview.  He was frightened of the television and had changes followed to a different number.  He was depressed but not suicidal.  He was paranoid and guarded.  He has a history of cutting his wrist as well as overdoses.  He has a history of poor compliance with medications and  multiple hospitalizations.  He had been admitted at least on 3 occasions to the behavioral health hospital since 2015.  He had a history of inpatient hospitalizations in [place], [place] as well as other states.  He has previously been treated with Abilify, Ambien, Tegretol, Thorazine, Klonopin, Luvox, gabapentin, Vistaril, Remeron, prazosin, Risperdal, Seroquel, Vyvanse, trazodone and Lamictal.  He was transferred to our facility for continued treatment.  Continued Clinical Symptoms:  Alcohol Use Disorder Identification Test Final Score (AUDIT): 1 The "Alcohol Use Disorders Identification Test", Guidelines for Use in Primary Care, Second Edition.  World Pharmacologist [facility]). Score between 0-7:  no or low risk or alcohol related problems. Score between 8-15:  moderate risk of alcohol related problems. Score between 16-19:  high risk of alcohol related problems. Score 20 or above:  warrants further diagnostic evaluation for alcohol dependence and treatment.   CLINICAL FACTORS:   Bipolar Disorder:   Depressive phase Schizophrenia:   Paranoid or undifferentiated type Personality Disorders:   Cluster B   Musculoskeletal: Strength & Muscle Tone: within normal limits Gait & Station: normal Patient leans: N/A  Psychiatric Specialty Exam: Physical Exam  Nursing note and vitals reviewed. Constitutional: He appears well-developed and well-nourished.  HENT:  Head: Normocephalic and atraumatic.  Respiratory: Effort normal.  Neurological: He is alert.    Review of Systems  Blood pressure 134/79, pulse 84, temperature 97.7 F (36.5 C), temperature source Oral, resp. rate 18, height 5\' 10"  (1.778 m), weight 80.7 kg, SpO2 98 %.Body mass index is 25.54 kg/m.  General Appearance: Disheveled  Eye Contact:  Fair  Speech:  Normal Rate  Volume:  Decreased  Mood:  Dysphoric  Affect:  Flat  Thought Process:  Linear and Descriptions of Associations: Circumstantial  Orientation:  Full  (Time, Place, and Person)  Thought Content:  Delusions, Paranoid Ideation and Rumination  Suicidal Thoughts:  Yes.  with intent/plan  Homicidal Thoughts:  No  Memory:  Immediate;   Poor Recent;   Poor Remote;   Poor  Judgement:  Impaired  Insight:  Lacking  Psychomotor Activity:  Decreased  Concentration:  Concentration: Fair and Attention Span: Fair  Recall:  [employer] of Knowledge:  Poor  Language:  Fair  Akathisia:  Negative  Handed:  Right  AIMS (if indicated):     Assets:  Desire for Improvement Resilience  ADL's:  Impaired  Cognition:  WNL  Sleep:         COGNITIVE FEATURES THAT CONTRIBUTE TO RISK:  None    SUICIDE RISK:   Moderate:  Frequent suicidal ideation with limited intensity, and duration, some specificity in terms of plans, no associated intent, good self-control, limited dysphoria/symptomatology, some risk factors present, and identifiable protective factors, including available and accessible social support.  PLAN OF CARE: Patient is seen and examined.  Patient is a 49 year old male with the above-stated past psychiatric history who was transferred to our facility for continued treatment secondary to schizoaffective disorder versus bipolar disorder, depression, posttraumatic stress disorder and suicidal ideation.  He will be admitted to the hospital.  He will be integrated in the milieu.  He will be encouraged to attend groups.  Review of that last note revealed that he was prescribed Klonopin 1 mg p.o. 3 times daily, lithium carbonate 300 mg p.o. twice daily and Seroquel 100 mg p.o. daily and 300 mg p.o. nightly.  These will be restarted.  Review of his laboratories from [facility] revealed essentially normal CBC, a mildly low sodium of 136, but otherwise normal electrolytes.  His RPR was nonreactive.  Urinalysis was essentially normal.  Drug screen was positive for amphetamines.  It does not appear that they obtained a lithium level.  With regard to the  amphetamines review of the PMP database revealed no prescriptions for stimulants.  His last prescription for controlled substance was for Klonopin on 07/12/2019.  Of note his drug screen was negative for clonazepam.  Signs are stable, he is afebrile.  His CIWA was 7 this AM.  He reportedly slept well overnight.  I certify that inpatient services furnished can reasonably be expected to improve the patient's condition.   Antonieta Pert, MD 07/19/2019, 9:34 AM
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
## 2019-07-19 NOTE — Progress Notes (Signed)
Patient slept throughout the night and did not express any concern.

## 2019-07-19 NOTE — Progress Notes (Signed)
   07/19/19 1500  Psych Admission Type (Psych Patients Only)  Admission Status Voluntary  Psychosocial Assessment  Patient Complaints Depression  Eye Contact Brief  Facial Expression Sad  Affect Depressed;Sad  Speech Logical/coherent  Interaction Minimal  Motor Activity Slow  Appearance/Hygiene Unremarkable  Behavior Characteristics Cooperative;Anxious  Mood Depressed;Anxious  Thought Process  Coherency WDL  Content Blaming self  Delusions Paranoid  Perception WDL  Hallucination None reported or observed  Judgment Poor  Confusion WDL  Danger to Self  Current suicidal ideation? Denies  Danger to Others  Danger to Others None reported or observed

## 2019-07-19 NOTE — Plan of Care (Signed)
Newly admitted. Mostly in room resting. No HS medications scheduled. Patient was educated about PRN medications and was encouraged to request medications as needed.

## 2019-07-19 NOTE — B[facility] Group Notes (Signed)
[facility] Group Notes: (Clinical Social Work)   07/19/2019      Type of Therapy:  Group Therapy (Anger Cues and Responses)  Participation Level:  Did Not Attend - was invited both individually by [facility] and by overhead announcement, chose not to attend.   Ambrose Mantle, LCSW 07/19/2019, 1:18 PM

## 2019-07-20 MED ORDER — ACETAMINOPHEN 325 MG PO TABS: 650.0000 mg | ORAL_TABLET | Freq: Four times a day (QID) | ORAL | Status: DC | PRN

## 2019-07-20 MED ORDER — QUETIAPINE FUMARATE 400 MG PO TABS
400.0000 mg | ORAL_TABLET | Freq: Every day | ORAL | Status: DC
  Administered 2019-07-20: 400 mg via ORAL
  Filled 2019-07-20 (×2): qty 1

## 2019-07-20 MED ORDER — TEMAZEPAM 15 MG PO CAPS: 15.0000 mg | ORAL_CAPSULE | Freq: Once | ORAL | Status: AC

## 2019-07-20 MED ORDER — LITHIUM CARBONATE ER 300 MG PO TBCR
600.0000 mg | EXTENDED_RELEASE_TABLET | Freq: Every evening | ORAL | Status: DC
  Administered 2019-07-20 – 2019-07-23 (×4): 600 mg via ORAL
  Filled 2019-07-20 (×7): qty 2

## 2019-07-20 MED ORDER — LITHIUM CARBONATE ER 300 MG PO TBCR
300.0000 mg | EXTENDED_RELEASE_TABLET | Freq: Every day | ORAL | Status: DC
  Administered 2019-07-21 – 2019-07-24 (×6): 300 mg via ORAL
  Filled 2019-07-20 (×8): qty 1

## 2019-07-20 MED ORDER — TEMAZEPAM 15 MG PO CAPS
ORAL_CAPSULE | ORAL | Status: AC
  Administered 2019-07-20: 15 mg via ORAL
  Filled 2019-07-20: qty 1
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
## 2019-07-20 NOTE — Progress Notes (Signed)
D. Pt remains depressed, paranoid, questioning 'what is real and what isn't', and isolates to room. Pt currently denies SI/HI and AVH and agrees to contact staff before acting on any harmful thoughts.  A. Labs and vitals monitored. Pt compliant with medications. Pt supported emotionally and encouraged to express concerns and ask questions.   R. Pt remains safe with 15 minute checks. Will continue POC.

## 2019-07-20 NOTE — [facility] Suicide Risk Assessment (Signed)
[facility] INPATIENT:  Family/Significant Other Suicide Prevention Education  Suicide Prevention Education:  Contact Attempts: ex-girlfriend Demetrice Long [phone] or best friend Dione Booze [phone], (name of family member/significant other) has been identified by the patient as the family member/significant other with whom the patient will be residing, and identified as the person(s) who will aid the patient in the event of a mental health crisis.  With written consent from the patient, two attempts were made to provide suicide prevention education, prior to and/or following the patient's discharge.  We were unsuccessful in providing suicide prevention education.  A suicide education pamphlet was given to the patient to share with family/significant other.  Date and time of first attempt:   07/20/2019  /  1:43 PM   With best friend, HIPAA-compliant VM left;   07/20/2019  / 1:44 PM With ex-girlfriend, but the person who answered said this was his number not hers  Date and time of second attempt:     CSW team to follow up  Lynnell Chad 07/20/2019, 1:48 PM

## 2019-07-21 MED ORDER — LURASIDONE HCL 40 MG PO TABS
40.0000 mg | ORAL_TABLET | Freq: Every day | ORAL | Status: DC
  Administered 2019-07-21: 40 mg via ORAL
  Filled 2019-07-21 (×3): qty 1

## 2019-07-21 MED ORDER — DOXEPIN HCL 25 MG PO CAPS
25.0000 mg | ORAL_CAPSULE | Freq: Every day | ORAL | Status: DC
  Administered 2019-07-21: 25 mg via ORAL
  Filled 2019-07-21 (×3): qty 1

## 2019-07-21 MED ORDER — QUETIAPINE FUMARATE 200 MG PO TABS
200.0000 mg | ORAL_TABLET | Freq: Every day | ORAL | Status: DC
  Administered 2019-07-21: 200 mg via ORAL
  Filled 2019-07-21 (×3): qty 1

## 2019-07-21 NOTE — [facility] Suicide Risk Assessment (Signed)
[facility] INPATIENT:  Family/Significant Other Suicide Prevention Education  Suicide Prevention Education:  Education Completed; best friend, Dione Booze [phone] has been identified by the patient as the family member/significant other with whom the patient will be residing, and identified as the person(s) who will aid the patient in the event of a mental health crisis (suicidal ideations/suicide attempt).  With written consent from the patient, the family member/significant other has been provided the following suicide prevention education, prior to the and/or following the discharge of the patient.  The suicide prevention education provided includes the following:  Suicide risk factors  Suicide prevention and interventions  National Suicide Hotline telephone number  [facility] assessment telephone number  [facility] Emergency Assistance 911  [facility] and/or Residential Mobile Crisis Unit telephone number  Request made of family/significant other to:  Remove weapons (e.g., guns, rifles, knives), all items previously/currently identified as safety concern.    Remove drugs/medications (over-the-counter, prescriptions, illicit drugs), all items previously/currently identified as a safety concern.  The family member/significant other verbalizes understanding of the suicide prevention education information provided.  The family member/significant other agrees to remove the items of safety concern listed above.  Francee Piccolo, patient's long time friend, lives in [place]. Francee Piccolo reports that he has known the patient since they were 49 years old. They will go months or years without talking and then catch-up with each other and speak frequently. Francee Piccolo started communicating with patient again about 2 weeks ago and reports that the patient was initially in good spirits and planned to fix his vehicle to visit a Derek in [place].   Last Thursday (one day prior to admission)  patient called Francee Piccolo and made comments about installing cameras in his cabin. Patient sounded "off." Francee Piccolo heard from patient again this morning and patient stated that "they must have already come in and installed cameras." Francee Piccolo reports patient's mental health and episodes of SI and paranoia can be cyclical. He hopes his patient improves soon and he plans to follow up with the patient.   Darreld Mclean 07/21/2019, 48:00 PM

## 2019-07-21 NOTE — Progress Notes (Signed)
[facility] MD Progress Note  07/21/2019 10:56 AM Phillip [name]  MRN:  [id number] Subjective:  Patient is a 49 year old male with a past psychiatric history significant for schizoaffective disorder who originally presented to the [facility] emergency department on 07/17/2019 with suicidal ideation. The patient stated at that time that he had worsening depression and worsening suicidal ideation.   Objective: Patient is seen and examined.  Patient is a 49 year old male with the above-stated past psychiatric history seen in follow-up.  He slightly improved today.  He is able to carry on some form of a conversation.  He still ruminates on "being honest", "being the best I can be".  He is unable to really describe what those things mean to him.  He does continues to circulate back to that.  He complains of fatigue.  We discussed the fact that he had been on methamphetamines as well as amphetamines, and that discontinuation of drugs like that would lead to fatigue.  He also stated that being on Seroquel leads to weight gain we discussed options.  He stated his auditory hallucinations have decreased to a degree.  A lithium level was finally obtained on 4/24.  His lithium level was 0.43.  No other new laboratories.  Vital signs are stable, he is afebrile.  He slept 6.25 hours last night.  He denied suicidal or homicidal ideation.  Principal Problem: Bipolar affective disorder, depressed, severe (HCC) Diagnosis: Principal Problem:   Bipolar affective disorder, depressed, severe (HCC)  Total Time spent with patient: 20 minutes  Past Psychiatric History: Admission H&P  Past Medical History:  Past Medical History:  Diagnosis Date  . Anxiety   . Bipolar disorder (HCC)   . Depression   . PTSD (post-traumatic stress disorder)     Past Surgical History:  Procedure Laterality Date  . cholesterol    . WISDOM TOOTH EXTRACTION     Family History:  Family History  Problem Relation Age of Onset  .  Cancer Mother   . Cancer Father   . Depression Father    Family Psychiatric  History: See admission H&P Social History:  Social History   Substance and Sexual Activity  Alcohol Use No     Social History   Substance and Sexual Activity  Drug Use No  . Types: Marijuana   Comment: seldom    Social History   Socioeconomic History  . Marital status: Divorced    Spouse name: Not on file  . Number of children: Not on file  . Years of education: Not on file  . Highest education level: Not on file  Occupational History  . Not on file  Tobacco Use  . Smoking status: Current Every Day Smoker    Packs/day: 0.60    Years: 19.00    Pack years: 11.40    Types: Cigarettes  . Smokeless tobacco: Never Used  . Tobacco comment: Has cut back to 4 a day and trying to stop  Substance and Sexual Activity  . Alcohol use: No  . Drug use: No    Types: Marijuana    Comment: seldom  . Sexual activity: Yes    Partners: Female    Birth control/protection: None  Other Topics Concern  . Not on file  Social History Narrative  . Not on file   Social Determinants of Health   Financial Resource Strain:   . Difficulty of Paying Living Expenses:   Food Insecurity:   . Worried About Programme researcher, broadcasting/film/video in the Last  Year:   . Ran Out of Food in the Last Year:   Transportation Needs:   . Freight forwarder (Medical):   Marland Kitchen Lack of Transportation (Non-Medical):   Physical Activity:   . Days of Exercise per Week:   . Minutes of Exercise per Session:   Stress:   . Feeling of Stress :   Social Connections:   . Frequency of Communication with Friends and Family:   . Frequency of Social Gatherings with Friends and Family:   . Attends Religious Services:   . Active Member of Clubs or Organizations:   . Attends Banker Meetings:   Marland Kitchen Marital Status:    Additional Social History:                         Sleep: Fair  Appetite:  Fair  Current Medications: Current  Facility-Administered Medications  Medication Dose Route Frequency Provider Last Rate Last Admin  . acetaminophen (TYLENOL) tablet 650 mg  650 mg Oral Q6H PRN Antonieta Pert, MD      . alum & mag hydroxide-simeth (MAALOX/MYLANTA) 200-200-20 MG/5ML suspension 30 mL  30 mL Oral Q4H PRN Charm Rings, NP   30 mL at 07/18/19 1848  . clonazePAM (KLONOPIN) tablet 1 mg  1 mg Oral TID Antonieta Pert, MD   1 mg at 07/21/19 0759  . lithium carbonate (LITHOBID) CR tablet 300 mg  300 mg Oral Daily Antonieta Pert, MD   300 mg at 07/21/19 0757  . lithium carbonate (LITHOBID) CR tablet 600 mg  600 mg Oral QPM Antonieta Pert, MD   600 mg at 07/20/19 1721  . lurasidone (LATUDA) tablet 40 mg  40 mg Oral Q supper Antonieta Pert, MD      . magnesium hydroxide (MILK OF MAGNESIA) suspension 30 mL  30 mL Oral Daily PRN Charm Rings, NP      . nicotine (NICODERM CQ - dosed in mg/24 hours) patch 21 mg  21 mg Transdermal Daily Antonieta Pert, MD   21 mg at 07/21/19 0759  . QUEtiapine (SEROQUEL) tablet 200 mg  200 mg Oral QHS Antonieta Pert, MD        Lab Results:  Results for orders placed or performed during the hospital encounter of 07/18/19 (from the past 48 hour(s))  Lithium level     Status: Abnormal   Collection Time: 07/19/19  6:29 PM  Result Value Ref Range   Lithium Lvl 0.43 (L) 0.60 - 1.20 mmol/L    Comment: Performed at [facility], [address] [place], [place] [address]  TSH     Status: None   Collection Time: 07/19/19  6:29 PM  Result Value Ref Range   TSH 0.552 0.350 - 4.500 uIU/mL    Comment: Performed by a 3rd Generation assay with a functional sensitivity of <=0.01 uIU/mL. Performed at [facility], [address], [place], [place] [address]     Blood Alcohol level:  Lab Results  Component Value Date   [facility] <5 06/03/2016   ETH <5 05/06/2015    Metabolic Disorder Labs: Lab Results  Component Value Date   HGBA1C  5.2 06/04/2016   MPG 103 06/04/2016   MPG 117 06/15/2014   Lab Results  Component Value Date   PROLACTIN 19.5 (H) 06/04/2016   Lab Results  Component Value Date   CHOL 313 (H) 06/04/2016   TRIG 299 (H) 06/04/2016  HDL 46 06/04/2016   CHOLHDL 6.8 06/04/2016   VLDL 60 (H) 06/04/2016   LDLCALC 207 (H) 06/04/2016   LDLCALC 132 (H) 01/29/2014    Physical Findings: AIMS:  , ,  ,  ,    CIWA:  CIWA-Ar Total: 7 COWS:     Musculoskeletal: Strength & Muscle Tone: within normal limits Gait & Station: normal Patient leans: N/A  Psychiatric Specialty Exam: Physical Exam  Nursing note and vitals reviewed. Constitutional: He appears well-developed and well-nourished.  HENT:  Head: Normocephalic and atraumatic.  Respiratory: Effort normal.  Neurological: He is alert.    Review of Systems  Blood pressure 122/83, pulse 84, temperature 97.7 F (36.5 C), temperature source Oral, resp. rate 16, height 5\' 10"  (1.778 m), weight 80.7 kg, SpO2 99 %.Body mass index is 25.54 kg/m.  General Appearance: Casual  Eye Contact:  Fair  Speech:  Normal Rate  Volume:  Decreased  Mood:  Anxious, Depressed and Dysphoric  Affect:  Flat  Thought Process:  Goal Directed and Descriptions of Associations: Loose  Orientation:  Full (Time, Place, and Person)  Thought Content:  Delusions and Hallucinations: Auditory  Suicidal Thoughts:  No  Homicidal Thoughts:  No  Memory:  Immediate;   Poor Recent;   Poor Remote;   Poor  Judgement:  Impaired  Insight:  Fair  Psychomotor Activity:  Decreased  Concentration:  Concentration: Fair and Attention Span: Fair  Recall:  [employer] of Knowledge:  Fair  Language:  Fair  Akathisia:  Negative  Handed:  Right  AIMS (if indicated):     Assets:  Desire for Improvement Resilience  ADL's:  Intact  Cognition:  WNL  Sleep:  Number of Hours: 6.25     Treatment Plan Summary: Daily contact with patient to assess and evaluate symptoms and progress in  treatment, Medication management and Plan : Patient is seen and examined.  Patient is a 49 year old male with the above-stated past psychiatric history who is seen in follow-up.   Diagnosis: #1 schizoaffective disorder; bipolar type, #2 stimulants/amphetamine dependence, #3 osteoarthritis, #4 PTSD  Patient is seen in follow-up.  He is a little more organized today, and can carry on some conversations but still ruminates and returns back to "being honest" and to "being the best I can be".  He stated today that he does not think taking Seroquel, admitted to noncompliance with it.  He stated was because of weight gain.  We discussed the possibility of switching to [place].  I will decrease his Seroquel at bedtime to 200 mg nightly and transition to the [place].  We will start the Latuda at 40 mg p.o. every afternoon meal.  We will continue his lithium at 300 mg p.o. daily and 600 mg p.o. nightly.  He did receive Restoril last night 15 mg.  I will start doxepin titrate that up for sleep.  No other change in the medications.  Since he is on clonazepam 3 times daily I do not think going on Restoril would be a good idea.  1.  Continue clonazepam 1 mg p.o. 3 times daily for anxiety. 2.  Continue lithium carbonate CR 300 mg p.o. daily and 600 mg p.o. every afternoon. 3.  Start Latuda 40 mg p.o. every afternoon meal for psychosis. 4.  Decrease Seroquel to 200 mg p.o. nightly for psychosis and sleep. 5.  Start doxepin 50 mg p.o. nightly for sleep. 6.  Disposition planning-in progress.  Sharma Covert, MD 07/21/2019, 10:56 AM

## 2019-07-21 NOTE — Tx Team (Signed)
Interdisciplinary Treatment and Diagnostic Plan Update  07/21/2019 Time of Session: 9:00am Phillip [name] MRN: [id number]  Principal Diagnosis: Bipolar affective disorder, depressed, severe (HCC)  Secondary Diagnoses: Principal Problem:   Bipolar affective disorder, depressed, severe (HCC)   Current Medications:  Current Facility-Administered Medications  Medication Dose Route Frequency Provider Last Rate Last Admin  . acetaminophen (TYLENOL) tablet 650 mg  650 mg Oral Q6H PRN Antonieta Pert, MD      . alum & mag hydroxide-simeth (MAALOX/MYLANTA) 200-200-20 MG/5ML suspension 30 mL  30 mL Oral Q4H PRN Charm Rings, NP   30 mL at 07/18/19 1848  . clonazePAM (KLONOPIN) tablet 1 mg  1 mg Oral TID Antonieta Pert, MD   1 mg at 07/21/19 0759  . lithium carbonate (LITHOBID) CR tablet 300 mg  300 mg Oral Daily Antonieta Pert, MD   300 mg at 07/21/19 0757  . lithium carbonate (LITHOBID) CR tablet 600 mg  600 mg Oral QPM Antonieta Pert, MD   600 mg at 07/20/19 1721  . magnesium hydroxide (MILK OF MAGNESIA) suspension 30 mL  30 mL Oral Daily PRN Charm Rings, NP      . nicotine (NICODERM CQ - dosed in mg/24 hours) patch 21 mg  21 mg Transdermal Daily Antonieta Pert, MD   21 mg at 07/21/19 0759  . QUEtiapine (SEROQUEL) tablet 100 mg  100 mg Oral Daily Antonieta Pert, MD   100 mg at 07/21/19 0758  . QUEtiapine (SEROQUEL) tablet 400 mg  400 mg Oral QHS Antonieta Pert, MD   400 mg at 07/20/19 2046   PTA Medications: Medications Prior to Admission  Medication Sig Dispense Refill Last Dose  . clonazePAM (KLONOPIN) 1 MG tablet Take one tablet po tid 90 tablet 3 Unknown at Unknown time    Patient Stressors: Loss of relationship Marital or family conflict  Patient Strengths: Barrister's clerk for treatment/growth Supportive family/friends  Treatment Modalities: Medication Management, Group therapy, Case management,  1 to 1 session with clinician,  Psychoeducation, Recreational therapy.   Physician Treatment Plan for Primary Diagnosis: Bipolar affective disorder, depressed, severe (HCC) Long Term Goal(s): Improvement in symptoms so as ready for discharge Improvement in symptoms so as ready for discharge   Short Term Goals: Ability to identify changes in lifestyle to reduce recurrence of condition will improve Ability to verbalize feelings will improve Ability to disclose and discuss suicidal ideas Ability to demonstrate self-control will improve Ability to identify and develop effective coping behaviors will improve Compliance with prescribed medications will improve Ability to identify triggers associated with substance abuse/mental health issues will improve  Medication Management: Evaluate patient's response, side effects, and tolerance of medication regimen.  Therapeutic Interventions: 1 to 1 sessions, Unit Group sessions and Medication administration.  Evaluation of Outcomes: Progressing  Physician Treatment Plan for Secondary Diagnosis: Principal Problem:   Bipolar affective disorder, depressed, severe (HCC)  Long Term Goal(s): Improvement in symptoms so as ready for discharge Improvement in symptoms so as ready for discharge   Short Term Goals: Ability to identify changes in lifestyle to reduce recurrence of condition will improve Ability to verbalize feelings will improve Ability to disclose and discuss suicidal ideas Ability to demonstrate self-control will improve Ability to identify and develop effective coping behaviors will improve Compliance with prescribed medications will improve Ability to identify triggers associated with substance abuse/mental health issues will improve     Medication Management: Evaluate patient's response, side effects, and tolerance of  medication regimen.  Therapeutic Interventions: 1 to 1 sessions, Unit Group sessions and Medication administration.  Evaluation of Outcomes:  Progressing   RN Treatment Plan for Primary Diagnosis: Bipolar affective disorder, depressed, severe ([place]) Long Term Goal(s): Knowledge of disease and therapeutic regimen to maintain health will improve  Short Term Goals: Ability to verbalize feelings will improve, Ability to identify and develop effective coping behaviors will improve and Compliance with prescribed medications will improve  Medication Management: RN will administer medications as ordered by provider, will assess and evaluate patient's response and provide education to patient for prescribed medication. RN will report any adverse and/or side effects to prescribing provider.  Therapeutic Interventions: 1 on 1 counseling sessions, Psychoeducation, Medication administration, Evaluate responses to treatment, Monitor vital signs and CBGs as ordered, Perform/monitor CIWA, COWS, AIMS and Fall Risk screenings as ordered, Perform wound care treatments as ordered.  Evaluation of Outcomes: Progressing   LCSW Treatment Plan for Primary Diagnosis: Bipolar affective disorder, depressed, severe ([place]) Long Term Goal(s): Safe transition to appropriate next level of care at discharge, Engage patient in therapeutic group addressing interpersonal concerns.  Short Term Goals: Engage patient in aftercare planning with referrals and resources, Facilitate patient progression through stages of change regarding substance use diagnoses and concerns, Identify triggers associated with mental health/substance abuse issues and Increase skills for wellness and recovery  Therapeutic Interventions: Assess for all discharge needs, 1 to 1 time with Social worker, Explore available resources and support systems, Assess for adequacy in community support network, Educate family and significant other(s) on suicide prevention, Complete Psychosocial Assessment, Interpersonal group therapy.  Evaluation of Outcomes: Progressing   Progress in Treatment: Attending  groups: Yes. Participating in groups: Yes. Taking medication as prescribed: Yes. Toleration medication: Yes. Family/Significant other contact made: No, will contact:  one attempt to reach ex-girlfriend, will follow up. Patient understands diagnosis: No. Discussing patient identified problems/goals with staff: No. Medical problems stabilized or resolved: Yes. Denies suicidal/homicidal ideation: Yes. Issues/concerns per patient self-inventory: Yes.  New problem(s) identified: Yes, Describe:  recent break up, access to fire arms  New Short Term/Long Term Goal(s): detox, medication management for mood stabilization; elimination of SI thoughts; development of comprehensive mental wellness/sobriety plan.  Patient Goals: "Become the person I said I was and apologize."  Discharge Plan or Barriers: Patient admitted over the weekend, CSW assessing for appropriate referrals. Patient sees Dr.Plovsky outpatient for medication management.  Reason for Continuation of Hospitalization: Anxiety Delusions  Hallucinations Medication stabilization  Estimated Length of Stay: 3-5 days  Attendees: Patient: Phillip [name] 07/21/2019 9:50 AM  Physician: Queen Blossom 07/21/2019 9:50 AM  Nursing:  07/21/2019 9:50 AM  RN Care Manager: 07/21/2019 9:50 AM  Social Worker: Stephanie Acre, Latanya Presser 07/21/2019 9:50 AM  Recreational Therapist:  07/21/2019 9:50 AM  Other:  07/21/2019 9:50 AM  Other:  07/21/2019 9:50 AM  Other: 07/21/2019 9:50 AM    Scribe for Treatment Team: Joellen Jersey, [place] 07/21/2019 9:50 AM

## 2019-07-21 NOTE — Progress Notes (Signed)
   07/21/19 2100  Psych Admission Type (Psych Patients Only)  Admission Status Voluntary  Psychosocial Assessment  Patient Complaints Anxiety  Eye Contact Fair  Facial Expression Sad  Affect Anxious;Depressed;Sad  Speech Logical/coherent  Interaction Minimal  Motor Activity Slow  Appearance/Hygiene Unremarkable  Behavior Characteristics Cooperative  Mood Depressed  Thought Process  Coherency WDL  Content WDL  Delusions Paranoid  Perception Derealization  Hallucination None reported or observed  Judgment Impaired  Confusion WDL  Danger to Self  Current suicidal ideation? Denies  Danger to Others  Danger to Others None reported or observed

## 2019-07-21 NOTE — Progress Notes (Signed)
   07/21/19 1000  Vital Signs  Temp 97.9 F (36.6 C)  Temp Source Oral  Pulse Rate 94  Pulse Rate Source Dinamap  Resp 16  BP (!) 126/91  BP Location Left Wrist  BP Method Automatic  Patient Position (if appropriate) Standing  Pain Assessment  Pain Scale 0-10  Pain Score 0   D: Pt. Isolative in room. Pt. Stated, "..I am trying to make sense of my past."  Pt. Took medicine.  Pt. Presented with a anxious and depressed affect.  A:  Patient took scheduled medicine.  Support and encouragement provided Routine safety checks conducted every 15 minutes. Patient  Informed to notify staff with any concerns.  Safety maintained. R:  No adverse drug reactions noted.  Patient contracts for safety.  Patient compliant with medication and treatment plan. Patient cooperative and calm. Patient is isolative on the unit.  Safety maintained.

## 2019-07-21 NOTE — Progress Notes (Signed)
Recreation Therapy Notes  INPATIENT RECREATION THERAPY ASSESSMENT  Patient Details Name: Phillip [name] MRN: [id number] DOB: [birth year] Today's Date: 07/21/2019       Information Obtained From: Patient  Able to Participate in Assessment/Interview: Yes  Patient Presentation: Alert  Reason for Admission (Per Patient): Other (Comments)(Guilt, Lies and Drugs)  Patient Stressors: Other (Comment)(Loneliness, Guilt and not liking self)  Coping Skills:   Isolation, Self-Injury, Journal, TV, Arguments, Aggression, Music, Deep Breathing, Substance Abuse, Impulsivity, Talk, Art, Prayer, Intrusive Behavior  Leisure Interests (2+):  Nature - Fishing, Crafts - Other (Comment), Music - Listen, Art - Draw, Individual - Other (Comment)(building projects; shooting guns)  Frequency of Recreation/Participation: Other (Comment)(Fishing- Not often; Everything else- Monthly)  Awareness of Community Resources:  No  Expressed Interest in [employer] Information: No  County of Residence:  [place]  Patient Main Form of Transportation: Other (Comment)(It was his ex-girlfriend)  Patient Strengths:  Care about people; Try to help others  Patient Identified Areas of Improvement:  Learn to be honest with self and others; take medications correctly to be stable  Patient Goal for Hospitalization:  "get better and not put others through what I've been through; apologize to others and build a strong bond with God"  Current SI (including self-harm):  No  Current HI:  No  Current AVH: No  Staff Intervention Plan: Group Attendance, Collaborate with Interdisciplinary Treatment Team  Consent to Intern Participation: N/A    Caroll Rancher, LRT/CTRS  Lillia Abed, Santos Hardwick A 07/21/2019, 2:06 PM

## 2019-07-21 NOTE — Progress Notes (Signed)
Recreation Therapy Notes  Date: 4.26.21 Time: 1000 Location: 500 Hall Dayroom  Group Topic: Coping Skills  Goal Area(s) Addresses:  Patient will identify positive coping skills. Patient will identify benefit of using coping skills post d/c.  Behavioral Response: Engaged  Intervention: Worksheet  Activity: Mind Map.  LRT and patients filled in the first 8 boxes of the mind map with depression, boredom, loss of loved one, stress, humility, guilt, fear and loneliness.  Patients were then given time to come up with at least 3 coping skills for each instance where coping skills would be used.  Education: Pharmacologist, Building control surveyor.   Education Outcome: Acknowledges understanding/In group clarification offered/Needs additional education.   Clinical Observations/Feedback: Pt was quiet and reserved.  Pt was able to identify coping skills as go for a walk, speak to someone, do a project, remember the good times, ask for forgiveness, identify fear and face it head on.     Caroll Rancher, LRT/CTRS     Caroll Rancher A 07/21/2019 11:56 AM

## 2019-07-21 NOTE — Plan of Care (Signed)
Patient was in the dayroom before bedtime. Continues to endorse paranoid thoughts saying that things don't seem to be real. Patient is anxious about it and frequently reoriented to reality. Patient had a snack and received bedtime medications. Currently sleeping and safety maintained.

## 2019-07-22 MED ORDER — DOXEPIN HCL 50 MG PO CAPS
50.0000 mg | ORAL_CAPSULE | Freq: Every day | ORAL | Status: DC
  Administered 2019-07-22 – 2019-07-23 (×2): 50 mg via ORAL
  Filled 2019-07-22 (×4): qty 1

## 2019-07-22 MED ORDER — QUETIAPINE FUMARATE 100 MG PO TABS
100.0000 mg | ORAL_TABLET | Freq: Every day | ORAL | Status: DC
  Administered 2019-07-22: 100 mg via ORAL
  Filled 2019-07-22 (×2): qty 1

## 2019-07-22 MED ORDER — LURASIDONE HCL 80 MG PO TABS
80.0000 mg | ORAL_TABLET | Freq: Every day | ORAL | Status: DC
  Administered 2019-07-22 – 2019-07-23 (×2): 80 mg via ORAL
  Filled 2019-07-22 (×4): qty 1
  Filled 2019-07-22: qty 2

## 2019-07-22 NOTE — Progress Notes (Signed)
Adult Psychoeducational Group Note  Date:  07/22/2019 Time:  10:06 PM  Group Topic/Focus:  Wrap-Up Group:   The focus of this group is to help patients review their daily goal of treatment and discuss progress on daily workbooks.  Participation Level:  Active  Participation Quality:  Appropriate  Affect:  Appropriate  Cognitive:  Appropriate  Insight: Appropriate  Engagement in Group:  Developing/Improving  Modes of Intervention:  Discussion  Additional Comments:  Pt stated his goal for today was to focus on his treatment plan and to talk with his Social worker about his aftercare treatment plan. Pt stated he felt he accomplished his goals today. Pt stated he felt his relationship with his family needs to be improved. Pt stated been able to talk with his best friend today improved his day.  Pt rated his overall day on a 10. Pt stated been able to talk with his doctor correct all his medication aided in him having good day. Pt stated his appetite was pretty good today. Pt stated his slept last night was pretty good. Pt stated he was in no physical pain. Pt stated he had no pain issues today. Pt deny auditory or visual hallucinations. Pt denies thoughts of harming himself or others. Pt stated that he would alert staff if anything changes.  Felipa Furnace 07/22/2019, 10:06 PM

## 2019-07-22 NOTE — Progress Notes (Signed)
Pt is calm and cooperative. Pt is taking medications without incident.  Pt stated that he would like to attend inpatient drug rehab post d/c from [facility].  Pt reported that "last night was the best night of rest I've had in a long time." Pt is spending time in the dayroom and interacting with peers. Safety checks maintained q 15 minutes.  RN will continue to monitor and provide assistance as needed.

## 2019-07-22 NOTE — Progress Notes (Signed)
[facility] MD Progress Note  07/22/2019 12:02 PM Phillip [name]  MRN:  [id number] Subjective:  Patient is a 49 year old male with a past psychiatric history significant for schizoaffective disorder who originally presented to the [facility] emergency department on 07/17/2019 with suicidal ideation. The patient stated at that time that he had worsening depression and worsening suicidal ideation.  Objective: Patient is seen and examined.  Patient is a 50 year old male with the above-stated past psychiatric history is seen in follow-up.  He continues to slowly improve.  His conversation is much improved.  There is less thought blocking.  He does not appear to be as delusional as he had been on earlier visits.  He is able to carry on a conversation and less paranoia.  He also does not have any nihilistic/negative thoughts about having done "wrong" throughout his life.  Initially with regard to substance abuse treatment it was felt that because of his disorganization in his communication problems that residential substance abuse program would not be beneficial.  Because his communication skills have improved, and his psychosis has decreased I spoke with social work to discuss with him the potential for residential substance abuse treatment.  It is unclear exactly what he has been abusing stimulants for many years and treatment might be beneficial.  He denied any suicidal or homicidal ideation.  He denied any active auditory or visual hallucinations.  His vital signs are stable, he is afebrile.  He slept 7 hours last night.  He received 25 mg of doxepin for sleep, and denied any side effects to that.  His current psychiatric medications include clonazepam, doxepin, lithium, Latuda and Seroquel.  We are in the process of reducing the Seroquel and increasing the Latuda.  Review of his laboratories revealed no new laboratories.  Principal Problem: Bipolar affective disorder, depressed, severe (HCC) Diagnosis:  Principal Problem:   Bipolar affective disorder, depressed, severe (HCC)  Total Time spent with patient: 20 minutes  Past Psychiatric History: See admission H&P  Past Medical History:  Past Medical History:  Diagnosis Date  . Anxiety   . Bipolar disorder (HCC)   . Depression   . PTSD (post-traumatic stress disorder)     Past Surgical History:  Procedure Laterality Date  . cholesterol    . WISDOM TOOTH EXTRACTION     Family History:  Family History  Problem Relation Age of Onset  . Cancer Mother   . Cancer Father   . Depression Father    Family Psychiatric  History: See admission H&P Social History:  Social History   Substance and Sexual Activity  Alcohol Use No     Social History   Substance and Sexual Activity  Drug Use No  . Types: Marijuana   Comment: seldom    Social History   Socioeconomic History  . Marital status: Divorced    Spouse name: Not on file  . Number of children: Not on file  . Years of education: Not on file  . Highest education level: Not on file  Occupational History  . Not on file  Tobacco Use  . Smoking status: Current Every Day Smoker    Packs/day: 0.60    Years: 19.00    Pack years: 11.40    Types: Cigarettes  . Smokeless tobacco: Never Used  . Tobacco comment: Has cut back to 4 a day and trying to stop  Substance and Sexual Activity  . Alcohol use: No  . Drug use: No    Types: Marijuana  Comment: seldom  . Sexual activity: Yes    Partners: Female    Birth control/protection: None  Other Topics Concern  . Not on file  Social History Narrative  . Not on file   Social Determinants of Health   Financial Resource Strain:   . Difficulty of Paying Living Expenses:   Food Insecurity:   . Worried About Programme researcher, broadcasting/film/video in the Last Year:   . Barista in the Last Year:   Transportation Needs:   . Freight forwarder (Medical):   Marland Kitchen Lack of Transportation (Non-Medical):   Physical Activity:   . Days of  Exercise per Week:   . Minutes of Exercise per Session:   Stress:   . Feeling of Stress :   Social Connections:   . Frequency of Communication with Friends and Family:   . Frequency of Social Gatherings with Friends and Family:   . Attends Religious Services:   . Active Member of Clubs or Organizations:   . Attends Banker Meetings:   Marland Kitchen Marital Status:    Additional Social History:                         Sleep: Good  Appetite:  Good  Current Medications: Current Facility-Administered Medications  Medication Dose Route Frequency Provider Last Rate Last Admin  . acetaminophen (TYLENOL) tablet 650 mg  650 mg Oral Q6H PRN Antonieta Pert, MD      . alum & mag hydroxide-simeth (MAALOX/MYLANTA) 200-200-20 MG/5ML suspension 30 mL  30 mL Oral Q4H PRN Charm Rings, NP   30 mL at 07/18/19 1848  . clonazePAM (KLONOPIN) tablet 1 mg  1 mg Oral TID Antonieta Pert, MD   1 mg at 07/22/19 1137  . doxepin (SINEQUAN) capsule 25 mg  25 mg Oral QHS Antonieta Pert, MD   25 mg at 07/21/19 2057  . lithium carbonate (LITHOBID) CR tablet 300 mg  300 mg Oral Daily Antonieta Pert, MD   300 mg at 07/22/19 0805  . lithium carbonate (LITHOBID) CR tablet 600 mg  600 mg Oral QPM Antonieta Pert, MD   600 mg at 07/21/19 1702  . lurasidone (LATUDA) tablet 40 mg  40 mg Oral Q supper Antonieta Pert, MD   40 mg at 07/21/19 1659  . magnesium hydroxide (MILK OF MAGNESIA) suspension 30 mL  30 mL Oral Daily PRN Charm Rings, NP      . nicotine (NICODERM CQ - dosed in mg/24 hours) patch 21 mg  21 mg Transdermal Daily Antonieta Pert, MD   21 mg at 07/22/19 0806  . QUEtiapine (SEROQUEL) tablet 200 mg  200 mg Oral QHS Antonieta Pert, MD   200 mg at 07/21/19 2057    Lab Results: No results found for this or any previous visit (from the past 48 hour(s)).  Blood Alcohol level:  Lab Results  Component Value Date   ETH <5 06/03/2016   ETH <5 05/06/2015    Metabolic  Disorder Labs: Lab Results  Component Value Date   HGBA1C 5.2 06/04/2016   MPG 103 06/04/2016   MPG 117 06/15/2014   Lab Results  Component Value Date   PROLACTIN 19.5 (H) 06/04/2016   Lab Results  Component Value Date   CHOL 313 (H) 06/04/2016   TRIG 299 (H) 06/04/2016   HDL 46 06/04/2016   CHOLHDL 6.8 06/04/2016   VLDL 60 (H)  06/04/2016   LDLCALC 207 (H) 06/04/2016   LDLCALC 132 (H) 01/29/2014    Physical Findings: AIMS:  , ,  ,  ,    CIWA:  CIWA-Ar Total: 7 COWS:     Musculoskeletal: Strength & Muscle Tone: within normal limits Gait & Station: normal Patient leans: N/A  Psychiatric Specialty Exam: Physical Exam  Nursing note and vitals reviewed. Constitutional: He is oriented to person, place, and time. He appears well-developed and well-nourished.  HENT:  Head: Normocephalic and atraumatic.  Respiratory: Effort normal.  Neurological: He is alert and oriented to person, place, and time.    Review of Systems  Blood pressure 128/88, pulse 95, temperature 97.9 F (36.6 C), temperature source Oral, resp. rate 18, height 5\' 10"  (1.778 m), weight 80.7 kg, SpO2 99 %.Body mass index is 25.54 kg/m.  General Appearance: Casual  Eye Contact:  Fair  Speech:  Normal Rate  Volume:  Normal  Mood:  Depressed and Dysphoric  Affect:  Congruent  Thought Process:  Goal Directed and Descriptions of Associations: Circumstantial  Orientation:  Full (Time, Place, and Person)  Thought Content:  Delusions, Paranoid Ideation and Rumination  Suicidal Thoughts:  No  Homicidal Thoughts:  No  Memory:  Immediate;   Fair Recent;   Fair Remote;   Fair  Judgement:  Intact  Insight:  Fair  Psychomotor Activity:  Decreased  Concentration:  Concentration: Fair and Attention Span: Fair  Recall:  [employer] of Knowledge:  Fair  Language:  Good  Akathisia:  Negative  Handed:  Right  AIMS (if indicated):     Assets:  Desire for Improvement Resilience  ADL's:  Intact  Cognition:  WNL   Sleep:  Number of Hours: 7     Treatment Plan Summary: Daily contact with patient to assess and evaluate symptoms and progress in treatment, Medication management and Plan : Patient is seen and examined.  Patient is a 49 year old male with the above-stated past psychiatric history who is seen in follow-up.   Diagnosis: #1 schizoaffective disorder; bipolar type, #2 stimulants/amphetamine dependence, #3 osteoarthritis, #4 PTSD  Patient is seen in follow-up.  Again his organization continues to slowly improve, and his conversation improves as well.  There is less rumination about the negative feelings that he has about himself.  He was added [place] yesterday, and his Seroquel dosage was reduced.  I will continue that transition.  We will increase his Latuda 80 mg p.o. every afternoon.  I will decrease his Seroquel 200 mg p.o. nightly tonight, and hopefully be able to stop it tomorrow.  I will go on and increase his doxepin to 50 mg p.o. nightly since the decrease in the Seroquel should not lead to decrease sedation.  We will schedule a lithium level on 4/29.  Social work is now involved in terms of applying for substance abuse residential programs.  1.  Continue clonazepam 1 mg p.o. 3 times daily for anxiety. 2.  Increase doxepin to 50 mg p.o. nightly for insomnia and depression. 3.  Continue lithium carbonate CR 300 mg p.o. daily and 600 mg p.o. every afternoon for mood stability. 4.  Increase Latuda to 80 mg p.o. every afternoon AC for psychosis. 5.  Decrease Seroquel to 100 mg p.o. nightly for psychosis. 7.  Disposition planning-in progress.  Sharma Covert, MD 07/22/2019, 12:02 PM

## 2019-07-22 NOTE — Progress Notes (Signed)
   07/22/19 2100  Psych Admission Type (Psych Patients Only)  Admission Status Voluntary  Psychosocial Assessment  Patient Complaints Anxiety  Eye Contact Fair  Facial Expression Sad  Affect Anxious;Depressed;Sad  Speech Logical/coherent  Interaction Minimal  Motor Activity Slow  Appearance/Hygiene Unremarkable  Behavior Characteristics Cooperative  Mood Depressed;Pleasant  Thought Process  Coherency WDL  Content WDL  Delusions WDL  Perception WDL  Hallucination None reported or observed  Judgment Impaired  Confusion WDL  Danger to Self  Current suicidal ideation? Denies  Danger to Others  Danger to Others None reported or observed

## 2019-07-22 NOTE — [facility] Counselor (Addendum)
Patient expresses interest in residential substance use treatment. CSW discussed referrals.  Patient will be referred to [facility], [facility] in [place] and [facility].  CSW faxed [facility] referral [facility] Authorization #[id number] Valid 04/27-05/04  Unfortunately, patient was declined by [facility].  Enid Cutter, MSW, LCSW-A Clinical Social Worker [facility] Adult Unit

## 2019-07-23 MED ORDER — CLONAZEPAM 1 MG PO TABS
1.0000 mg | ORAL_TABLET | Freq: Three times a day (TID) | ORAL | Status: DC | PRN
  Administered 2019-07-23 – 2019-07-24 (×3): 1 mg via ORAL
  Filled 2019-07-23 (×3): qty 1

## 2019-07-23 NOTE — Progress Notes (Signed)
[facility] MD Progress Note  07/23/2019 10:26 AM Phillip [name]  MRN:  [id number] Subjective:  Patient is a 49 year old male with a past psychiatric history significant for schizoaffective disorder who originally presented to the [facility] emergency department on 07/17/2019 with suicidal ideation. The patient stated at that time that he had worsening depression and worsening suicidal ideation.  Objective: Patient is seen and examined.  Patient is a 49 year old male with the above-stated past psychiatric history is seen in follow-up. From a psychotic standpoint he continues to improve.  He denied auditory or visual hallucinations.  He denied suicidal or homicidal ideation.  He is highly motivated to go to a residential substance abuse treatment program after discharge.  Social work is working on that.  He has been turned down at [facility] treatment center, but they are working on [employer] and other programs.  His vital signs are stable, he is afebrile.  Drug screen was finally obtained on 4/24 which showed the presence of the amphetamines.  His TSH was 0.552, and a lithium level had been 0.43.  No other laboratories.  Patient denied any side effects to his current medications.  He slept 7.5 hours last night.  Principal Problem: Bipolar affective disorder, depressed, severe (HCC) Diagnosis: Principal Problem:   Bipolar affective disorder, depressed, severe (HCC)  Total Time spent with patient: 20 minutes  Past Psychiatric History: See admission H&P  Past Medical History:  Past Medical History:  Diagnosis Date  . Anxiety   . Bipolar disorder (HCC)   . Depression   . PTSD (post-traumatic stress disorder)     Past Surgical History:  Procedure Laterality Date  . cholesterol    . WISDOM TOOTH EXTRACTION     Family History:  Family History  Problem Relation Age of Onset  . Cancer Mother   . Cancer Father   . Depression Father    Family Psychiatric  History: See admission H&P Social  History:  Social History   Substance and Sexual Activity  Alcohol Use No     Social History   Substance and Sexual Activity  Drug Use No  . Types: Marijuana   Comment: seldom    Social History   Socioeconomic History  . Marital status: Divorced    Spouse name: Not on file  . Number of children: Not on file  . Years of education: Not on file  . Highest education level: Not on file  Occupational History  . Not on file  Tobacco Use  . Smoking status: Current Every Day Smoker    Packs/day: 0.60    Years: 19.00    Pack years: 11.40    Types: Cigarettes  . Smokeless tobacco: Never Used  . Tobacco comment: Has cut back to 4 a day and trying to stop  Substance and Sexual Activity  . Alcohol use: No  . Drug use: No    Types: Marijuana    Comment: seldom  . Sexual activity: Yes    Partners: Female    Birth control/protection: None  Other Topics Concern  . Not on file  Social History Narrative  . Not on file   Social Determinants of Health   Financial Resource Strain:   . Difficulty of Paying Living Expenses:   Food Insecurity:   . Worried About Programme researcher, broadcasting/film/video in the Last Year:   . Barista in the Last Year:   Transportation Needs:   . Freight forwarder (Medical):   Marland Kitchen Lack of Transportation (  Non-Medical):   Physical Activity:   . Days of Exercise per Week:   . Minutes of Exercise per Session:   Stress:   . Feeling of Stress :   Social Connections:   . Frequency of Communication with Friends and Family:   . Frequency of Social Gatherings with Friends and Family:   . Attends Religious Services:   . Active Member of Clubs or Organizations:   . Attends Archivist Meetings:   Marland Kitchen Marital Status:    Additional Social History:                         Sleep: Good  Appetite:  Good  Current Medications: Current Facility-Administered Medications  Medication Dose Route Frequency Provider Last Rate Last Admin  . acetaminophen  (TYLENOL) tablet 650 mg  650 mg Oral Q6H PRN Sharma Covert, MD      . alum & mag hydroxide-simeth (MAALOX/MYLANTA) 200-200-20 MG/5ML suspension 30 mL  30 mL Oral Q4H PRN Patrecia Pour, NP   30 mL at 07/18/19 1848  . clonazePAM (KLONOPIN) tablet 1 mg  1 mg Oral TID Sharma Covert, MD   1 mg at 07/23/19 0752  . doxepin (SINEQUAN) capsule 50 mg  50 mg Oral QHS Sharma Covert, MD   50 mg at 07/22/19 2035  . lithium carbonate (LITHOBID) CR tablet 300 mg  300 mg Oral Daily Sharma Covert, MD   300 mg at 07/23/19 0753  . lithium carbonate (LITHOBID) CR tablet 600 mg  600 mg Oral QPM Sharma Covert, MD   600 mg at 07/22/19 1704  . lurasidone (LATUDA) tablet 80 mg  80 mg Oral Q supper Sharma Covert, MD   80 mg at 07/22/19 1704  . magnesium hydroxide (MILK OF MAGNESIA) suspension 30 mL  30 mL Oral Daily PRN Patrecia Pour, NP      . nicotine (NICODERM CQ - dosed in mg/24 hours) patch 21 mg  21 mg Transdermal Daily Sharma Covert, MD   21 mg at 07/23/19 0754  . QUEtiapine (SEROQUEL) tablet 100 mg  100 mg Oral QHS Sharma Covert, MD   100 mg at 07/22/19 2036    Lab Results: No results found for this or any previous visit (from the past 37 hour(s)).  Blood Alcohol level:  Lab Results  Component Value Date   ETH <5 06/03/2016   ETH <5 61/60/7371    Metabolic Disorder Labs: Lab Results  Component Value Date   HGBA1C 5.2 06/04/2016   MPG 103 06/04/2016   MPG 117 06/15/2014   Lab Results  Component Value Date   PROLACTIN 19.5 (H) 06/04/2016   Lab Results  Component Value Date   CHOL 313 (H) 06/04/2016   TRIG 299 (H) 06/04/2016   HDL 46 06/04/2016   CHOLHDL 6.8 06/04/2016   VLDL 60 (H) 06/04/2016   LDLCALC 207 (H) 06/04/2016   LDLCALC 132 (H) 01/29/2014    Physical Findings: AIMS:  , ,  ,  ,    CIWA:  CIWA-Ar Total: 7 COWS:     Musculoskeletal: Strength & Muscle Tone: within normal limits Gait & Station: normal Patient leans: N/A  Psychiatric  Specialty Exam: Physical Exam  Nursing note and vitals reviewed. Constitutional: He is oriented to person, place, and time. He appears well-developed and well-nourished.  HENT:  Head: Normocephalic and atraumatic.  Respiratory: Effort normal.  Neurological: He is alert and oriented to person,  place, and time.    Review of Systems  Blood pressure 121/88, pulse 88, temperature 97.6 F (36.4 C), temperature source Oral, resp. rate 18, height 5\' 10"  (1.778 m), weight 80.7 kg, SpO2 99 %.Body mass index is 25.54 kg/m.  General Appearance: Casual  Eye Contact:  Fair  Speech:  Normal Rate  Volume:  Normal  Mood:  Euthymic  Affect:  Congruent  Thought Process:  Coherent and Descriptions of Associations: Circumstantial  Orientation:  Full (Time, Place, and Person)  Thought Content:  Logical  Suicidal Thoughts:  No  Homicidal Thoughts:  No  Memory:  Immediate;   Fair Recent;   Fair Remote;   Fair  Judgement:  Intact  Insight:  Fair  Psychomotor Activity:  Normal  Concentration:  Concentration: Fair and Attention Span: Fair  Recall:  of Knowledge:  Fair  Language:  Fair  Akathisia:  Negative  Handed:  Right  AIMS (if indicated):     Assets:  Desire for Improvement Housing Resilience  ADL's:  Intact  Cognition:  WNL  Sleep:  Number of Hours: 7.5     Treatment Plan Summary: Daily contact with patient to assess and evaluate symptoms and progress in treatment, Medication management and Plan : Patient is seen and examined.  Patient is a 49 year old male with the above-stated past psychiatric history who is seen in follow-up.   Diagnosis: #1 schizoaffective disorder; bipolar type, #2 stimulants/amphetamine dependence, #3 osteoarthritis, #4 PTSD  Patient is seen in follow-up he continues to slowly improve.  He is sleeping well with his current medications.  I will stop the Seroquel tonight.  We will continue the Latuda at 80 mg p.o. nightly.  He continues on lithium  carbonate as well.  The doxepin seems to be helping his sleep as well.  We will plan on transfer over to the [address] sometime today, and social work will continue to work on substance abuse residential treatment as well.  His lithium was increased on 4/26, so we will write for a lithium level in the a.m. tomorrow to make sure.  We will also get metabolic studies as well as a TSH.  1.  Continue clonazepam 1 mg p.o. 3 times daily, but we will change that to as needed versus standing for anxiety. 2.  Continue doxepin 50 mg p.o. nightly for mood and insomnia. 3.  Continue lithium carbonate CR 300 mg p.o. daily and 600 mg p.o. every afternoon for mood stability. 4.  Continue Latuda 80 mg p.o. before every meal p.m. for psychosis and mood stability. 5.  Stop Seroquel. 6.  Lithium level, TSH and metabolic panel in a.m. tomorrow. 7.  Continue to look at possibilities for substance abuse residential treatment. 8.  Disposition planning-in progress.  5/26, MD 07/23/2019, 10:26 AM

## 2019-07-23 NOTE — BHH Counselor (Signed)
Patient has expressed interest in participating in [facility] PHP at discharge. CSW will complete referral.  Patient requested CSW contact his landlord, [place] Scrape [phone], to establish that he is able to return to his residence. Ms.Howe confirms to CSW that patient is welcome to return to his cabin.  Enid Cutter, MSW, LCSW-A Clinical Social Worker [facility] Adult Unit

## 2019-07-23 NOTE — Progress Notes (Signed)
Recreation Therapy Notes  Date: 4.28.21 Time: 1000 Location: [address] Dayroom   Group Topic: Communication, Team Building, Problem Solving  Goal Area(s) Addresses:  Patient will effectively work with peer towards shared goal.  Patient will identify skills used to make activity successful.  Patient will identify how skills used during activity can be used to reach post d/c goals.   Behavioral Response: Engaged  Intervention: STEM Activity  Activity: Stage manager. In teams patients were given 12 plastic drinking straws and a length of masking tape. Using the materials provided patients were asked to build a landing pad to catch a golf ball dropped from approximately 6 feet in the air.   Education: Pharmacist, community, Discharge Planning   Education Outcome: Acknowledges education/In group clarification offered/Needs additional education.   Clinical Observations/Feedback:  Pt was active and focused during activity.  Pt worked well with peers to come up with a concept to create landing pad.  Pt listened as peers made suggestions and worked to implement them.  Pt was also social with peers.  Pt expressed one of the skills used by the group was patience.  Pt expressed this skill can be used with support system to "make adjustments" to the plan they create together to help you get through or deal with whatever obstacle you're face with.  Pt also explained, once the adjustments are made, you should "keep going".     Caroll Rancher, LRT/CTRS    Caroll Rancher A 07/23/2019 11:16 AM

## 2019-07-23 NOTE — Progress Notes (Signed)
   07/23/19 1300  Psych Admission Type (Psych Patients Only)  Admission Status Voluntary  Psychosocial Assessment  Patient Complaints Anxiety  Eye Contact Fair  Facial Expression Sad  Affect Anxious;Depressed;Sad  Speech Logical/coherent  Interaction Minimal  Motor Activity Slow  Appearance/Hygiene Unremarkable  Behavior Characteristics Cooperative  Mood Anxious;Pleasant  Thought Process  Coherency WDL  Content WDL  Delusions WDL  Perception WDL  Hallucination None reported or observed  Judgment Impaired  Confusion WDL  Danger to Self  Current suicidal ideation? Denies  Danger to Others  Danger to Others None reported or observed

## 2019-07-23 NOTE — [facility] Counselor (Signed)
CSW referred patient to [facility] PHP. Patient was denied due to concerns of substance use issues. CSW has requested a reconsideration for PHP as patient explained that he uses substances once every 3-6 months. Patient has explained that he will use amphetamines when his regularly prescribed medications run out. He has been declined by substance use treatment specific programming for this reason.   CSW following. Patient was referred to [facility] and is under review.  Enid Cutter, LCSW-A Clinical Social Worker

## 2019-07-23 NOTE — [facility] Counselor (Signed)
Patient's referral has been received by [facility] but has not been reviewed at this time. CSW was encouraged to call back after 1pm for updates.  Tewana Bohlen, MSW, LCSW-A Clinical Social Worker [facility] Adult [facility]  

## 2019-07-23 NOTE — BHH Suicide Risk Assessment (Signed)
[facility] Discharge Suicide Risk Assessment   Principal Problem: Bipolar affective disorder, depressed, severe (HCC) Discharge Diagnoses: Principal Problem:   Bipolar affective disorder, depressed, severe (HCC)   Total Time spent with patient: 15 minutes  Musculoskeletal: Strength & Muscle Tone: within normal limits Gait & Station: normal Patient leans: N/A  Psychiatric Specialty Exam: Review of Systems  All other systems reviewed and are negative.   Blood pressure 121/88, pulse 88, temperature 97.6 F (36.4 C), temperature source Oral, resp. rate 18, height 5\' 10"  (1.778 m), weight 80.7 kg, SpO2 99 %.Body mass index is 25.54 kg/m.  General Appearance: Casual  Eye Contact::  Fair  Speech:  Normal Rate409  Volume:  Normal  Mood:  Euthymic  Affect:  Congruent  Thought Process:  Coherent and Descriptions of Associations: Intact  Orientation:  Full (Time, Place, and Person)  Thought Content:  Logical  Suicidal Thoughts:  No  Homicidal Thoughts:  No  Memory:  Immediate;   Fair Recent;   Fair Remote;   Fair  Judgement:  Intact  Insight:  Fair  Psychomotor Activity:  Normal  Concentration:  Fair  Recall:  [phone] of Knowledge:Fair  Language: Good  Akathisia:  Negative  Handed:  Right  AIMS (if indicated):     Assets:  Communication Skills Desire for Improvement Housing Resilience  Sleep:  Number of Hours: 7.5  Cognition: WNL  ADL's:  Intact   Mental Status Per Nursing Assessment::   On Admission:  Suicidal ideation indicated by patient  Demographic Factors:  Male, Caucasian, Low socioeconomic status and Living alone  Loss Factors: NA  Historical Factors: Impulsivity  Risk Reduction Factors:   Positive coping skills or problem solving skills  Continued Clinical Symptoms:  Bipolar Disorder:   Mixed State Alcohol/Substance Abuse/Dependencies  Cognitive Features That Contribute To Risk:  None    Suicide Risk:  Minimal: No identifiable suicidal ideation.   Patients presenting with no risk factors but with morbid ruminations; may be classified as minimal risk based on the severity of the depressive symptoms  Follow-up Information    [phone], MD. Call on 09/12/2019.   Specialty: Psychiatry Why: You are scheduled for an appointment on 09/12/19 at 10:00 am.  This appointment will be held virtually.  Please contact this provider to see if they have any cancellations or appointments sooner. Contact information: [address] [place] [phone]        [facility] Follow up.   Contact information: [address] [place] [phone]        [facility] Follow up.   [phone] Contact information: [address] [place], [place]  P: [phone] F: [phone]       BEHAVIORAL HEALTH OUTPATIENT THERAPY [place] Follow up on 07/28/2019.   Specialty: Behavioral Health Why: Your intake appointment for the virtual Partial Hospitalization Program [facility]) will be held on Monday, 05/03 at 12pm. Your provider will email you a link for the appointment.  Contact information: [address] [place] [phone]          Plan Of Care/Follow-up recommendations:  Activity:  ad lib  [phone], MD 07/23/2019, 3:08 PM

## 2019-07-23 NOTE — Progress Notes (Addendum)
   07/23/19 2000  Psych Admission Type (Psych Patients Only)  Admission Status Voluntary  Psychosocial Assessment  Patient Complaints Anxiety  Eye Contact Fair  Facial Expression Sad  Affect Anxious;Depressed;Sad  Speech Logical/coherent  Interaction Minimal  Motor Activity Other (Comment) (WNL)  Appearance/Hygiene Unremarkable  Behavior Characteristics Cooperative;Anxious  Mood Anxious;Depressed  Thought Process  Coherency WDL  Content WDL  Delusions WDL  Perception WDL  Hallucination None reported or observed  Judgment Poor  Confusion None  Danger to Self  Current suicidal ideation? Denies  Danger to Others  Danger to Others None reported or observed   Pt seen at med window. Rates anxiety 8/10 and endorses symptoms of a panic attack of unknown origin. States that it is the worse one he has had in a while. Pt given PRN Klonopin 1 mg. Pt contracts for safety.

## 2019-07-24 DIAGNOSIS — F314 Bipolar disorder, current episode depressed, severe, without psychotic features: Principal | ICD-10-CM

## 2019-07-24 LAB — BASIC METABOLIC PANEL WITH GFR
Anion gap: 10 (ref 5–15)
BUN: 20 mg/dL (ref 6–20)
CO2: 24 mmol/L (ref 22–32)
Calcium: 9.9 mg/dL (ref 8.9–10.3)
Chloride: 105 mmol/L (ref 98–111)
Creatinine, Ser: 1.07 mg/dL (ref 0.61–1.24)
GFR calc Af Amer: >60 mL/min
GFR calc non Af Amer: >60 mL/min
Glucose, Bld: 121 mg/dL — ABNORMAL HIGH (ref 70–99)
Potassium: 4.1 mmol/L (ref 3.5–5.1)
Sodium: 139 mmol/L (ref 135–145)

## 2019-07-24 LAB — TSH: TSH: 2.659 u[IU]/mL (ref 0.350–4.500)

## 2019-07-24 LAB — LITHIUM LEVEL: Lithium Lvl: 0.58 mmol/L — ABNORMAL LOW (ref 0.60–1.20)

## 2019-07-24 MED ORDER — LITHIUM CARBONATE ER 300 MG PO TBCR: 600.0000 mg | EXTENDED_RELEASE_TABLET | Freq: Every evening | ORAL | 1 refills | Status: DC

## 2019-07-24 MED ORDER — LURASIDONE HCL 80 MG PO TABS: 80.0000 mg | ORAL_TABLET | Freq: Every day | ORAL | 1 refills | Status: DC

## 2019-07-24 MED ORDER — NICOTINE POLACRILEX 2 MG MT GUM
2.0000 mg | CHEWING_GUM | OROMUCOSAL | Status: DC | PRN
  Administered 2019-07-24 (×2): 2 mg via ORAL
  Filled 2019-07-24: qty 1

## 2019-07-24 MED ORDER — DOXEPIN HCL 50 MG PO CAPS: 50.0000 mg | ORAL_CAPSULE | Freq: Every day | ORAL | 1 refills | Status: DC

## 2019-07-24 NOTE — Progress Notes (Signed)
D::   Patient's self inventory sheet, patient sleeps good, no sleep medication.  Good appetite, normal energy level, good concentration.  Denied depression and hopeless, rated anxiety #2.  Denied withdrawals.  Then checked diarrhea.  Denied SI.  Denied physical problems.  Denied physical pain.  Goal is prepare for outpatient therapy.  Make a list of items needed for outpatient therapy.  Does have discharge plans.s A:  Medications administered per MD orders.  Emotional support and encouragement given patient. R:  Denied SI and HI, contracts for safety.  Denied A/V hallucinations.  Safety maintained with 15 minute checks.

## 2019-07-24 NOTE — Progress Notes (Signed)
Discharge Note:  Patient discharged home with transport.  Patient denied SI and HI.  Denied A/V hallucinations.  Suicide prevention information given and discussed with patient who stated he understood and had no questions.  Patient stated he received all his belongings, clothing, toiletries, misc items, etc.  Patient stated he appreciated all assistance received from [facility] staff.  All required discharge information given to patient at discharge.

## 2019-07-24 NOTE — Progress Notes (Addendum)
  [facility] Adult Case Management Discharge Plan :  Will you be returning to the same living situation after discharge:  Yes,  patient is returning home At discharge, do you have transportation home?: Yes,  CSW arranged Safe Transport Do you have the ability to pay for your medications: Yes,  Medicare  Release of information consent forms completed and in the chart;  Patient's signature needed at discharge.  Patient to Follow up at: Follow-up Information    Archer Asa, MD. Call on 09/12/2019.   Specialty: Psychiatry Why: You are scheduled for an appointment on 09/12/19 at 10:00 am.  This appointment will be held virtually.  Please contact this provider to see if they have any cancellations or appointments sooner. Contact information: [address] [place] [address] [phone]        BEHAVIORAL HEALTH OUTPATIENT THERAPY [place] Follow up on 07/30/2019.   Specialty: Behavioral Health Why: Your intake appointment for the virtual Partial Hospitalization Program [facility]) will be held on Wednesday, 05/05 at 12pm. Your provider will email you a link for the appointment.  Contact information: [address] [phone] [place] [address] [phone]          Next level of care provider has access to [facility] Link:yes  Safety Planning and Suicide Prevention discussed: Yes,  with best friend     Has patient been referred to the Quitline?: Patient refused referral  Patient has been referred for addiction treatment: N/A  Maeola Sarah, LCSWA 07/24/2019, 10:46 AM

## 2019-07-24 NOTE — Progress Notes (Signed)
Pt c/o constipation and upset stomach. Pt given Milk of Magnesia and Maalox. Will continue to monitor.

## 2019-07-24 NOTE — Progress Notes (Signed)
Patient did attend the evening wrap up group. Positive thinking and self-care were discussed. Patient was attentive, supportive, and engaged.  
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
## 2019-07-28 ENCOUNTER — Telehealth (HOSPITAL_COMMUNITY): Payer: Medicare Other

## 2019-07-30 ENCOUNTER — Other Ambulatory Visit: Payer: Self-pay

## 2019-07-30 ENCOUNTER — Telehealth (HOSPITAL_COMMUNITY): Payer: Self-pay | Admitting: Professional

## 2019-07-30 ENCOUNTER — Other Ambulatory Visit (HOSPITAL_COMMUNITY): Payer: Medicare Other

## 2019-09-06 DIAGNOSIS — H6123 Impacted cerumen, bilateral: Secondary | ICD-10-CM | POA: Diagnosis not present

## 2019-09-06 DIAGNOSIS — H9201 Otalgia, right ear: Secondary | ICD-10-CM | POA: Diagnosis not present

## 2019-09-12 ENCOUNTER — Telehealth (HOSPITAL_COMMUNITY): Payer: Medicare Other | Admitting: Psychiatry

## 2019-09-12 ENCOUNTER — Other Ambulatory Visit: Payer: Self-pay

## 2019-09-16 ENCOUNTER — Other Ambulatory Visit: Payer: Self-pay

## 2019-09-16 ENCOUNTER — Telehealth (INDEPENDENT_AMBULATORY_CARE_PROVIDER_SITE_OTHER): Payer: Medicare Other | Admitting: Psychiatry

## 2019-09-16 DIAGNOSIS — F200 Paranoid schizophrenia: Secondary | ICD-10-CM

## 2019-09-16 DIAGNOSIS — F332 Major depressive disorder, recurrent severe without psychotic features: Secondary | ICD-10-CM | POA: Diagnosis not present

## 2019-09-16 DIAGNOSIS — F319 Bipolar disorder, unspecified: Secondary | ICD-10-CM

## 2019-09-16 MED ORDER — CLONAZEPAM 1 MG PO TABS: ORAL_TABLET | ORAL | 3 refills | Status: DC

## 2019-09-16 MED ORDER — DOXEPIN HCL 50 MG PO CAPS: 50.0000 mg | ORAL_CAPSULE | Freq: Every day | ORAL | 1 refills | Status: DC

## 2019-09-16 MED ORDER — LITHIUM CARBONATE ER 300 MG PO TBCR: 600.0000 mg | EXTENDED_RELEASE_TABLET | Freq: Every evening | ORAL | 1 refills | Status: DC

## 2019-09-16 MED ORDER — LURASIDONE HCL 120 MG PO TABS: 120.0000 mg | ORAL_TABLET | Freq: Every day | ORAL | 1 refills | Status: DC
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
## 2019-10-11 ENCOUNTER — Other Ambulatory Visit: Payer: Self-pay

## 2019-10-11 ENCOUNTER — Emergency Department (HOSPITAL_COMMUNITY)
Admission: EM | Admit: 2019-10-11 | Discharge: 2019-10-11 | Disposition: A | Discharge status: Home / Self Care | Payer: Medicare Other | Attending: Emergency Medicine | Admitting: Emergency Medicine

## 2019-10-11 ENCOUNTER — Encounter (HOSPITAL_COMMUNITY): Payer: Self-pay

## 2019-10-11 DIAGNOSIS — R202 Paresthesia of skin: Secondary | ICD-10-CM | POA: Insufficient documentation

## 2019-10-11 DIAGNOSIS — Z5321 Procedure and treatment not carried out due to patient leaving prior to being seen by health care provider: Secondary | ICD-10-CM | POA: Insufficient documentation

## 2019-10-11 NOTE — ED Triage Notes (Signed)
He tells me his left arm "has been hurting for a month now" He has seen a couple of providers for this. He asks me "I'm not having a heart attack am I?" he also tells me that at first, this discomfort involved his neck, but his neck "feels better".

## 2019-10-11 NOTE — ED Notes (Signed)
Pt states he feels shooting pain through his arm

## 2019-11-19 ENCOUNTER — Telehealth (HOSPITAL_COMMUNITY): Payer: Medicare Other | Admitting: Psychiatry

## 2019-12-02 ENCOUNTER — Other Ambulatory Visit: Payer: Self-pay

## 2019-12-02 ENCOUNTER — Telehealth (HOSPITAL_COMMUNITY): Payer: Medicare Other | Admitting: Psychiatry

## 2019-12-19 ENCOUNTER — Telehealth (INDEPENDENT_AMBULATORY_CARE_PROVIDER_SITE_OTHER): Payer: Medicare Other | Admitting: Psychiatry

## 2019-12-19 ENCOUNTER — Other Ambulatory Visit: Payer: Self-pay

## 2019-12-19 DIAGNOSIS — F332 Major depressive disorder, recurrent severe without psychotic features: Secondary | ICD-10-CM

## 2019-12-19 DIAGNOSIS — F319 Bipolar disorder, unspecified: Secondary | ICD-10-CM

## 2019-12-19 DIAGNOSIS — F259 Schizoaffective disorder, unspecified: Secondary | ICD-10-CM

## 2019-12-19 DIAGNOSIS — F200 Paranoid schizophrenia: Secondary | ICD-10-CM | POA: Diagnosis not present

## 2019-12-19 DIAGNOSIS — F3110 Bipolar disorder, current episode manic without psychotic features, unspecified: Secondary | ICD-10-CM

## 2019-12-19 MED ORDER — CLONAZEPAM 1 MG PO TABS: ORAL_TABLET | ORAL | 3 refills | Status: DC

## 2019-12-19 MED ORDER — DOXEPIN HCL 50 MG PO CAPS: 50.0000 mg | ORAL_CAPSULE | Freq: Every day | ORAL | 1 refills | Status: DC

## 2019-12-19 MED ORDER — LITHIUM CARBONATE ER 300 MG PO TBCR: 600.0000 mg | EXTENDED_RELEASE_TABLET | Freq: Every evening | ORAL | 1 refills | Status: DC

## 2019-12-19 NOTE — Progress Notes (Addendum)
Virtual Visit via Telephone Note  At this time it is difficult to leave the note in the system.  Nonetheless this will be the note performed on 12/19/2019.  The patient has discontinued his Latuda.  This patient is at his baseline.  He lives [place] lot [place] land by himself.  He is very isolated.  He has a loaded gun.  He has no wishes to hurt himself or anyone else.  He is not suicidal.  He takes care of his Usually 48 years old.  The patient was psychiatrically hospitalized begun on Latuda which had minimal benefits.  From his last visit we decreased it to a dose of 120 mg.  He said that Jordan never worked and in fact the higher dose made him have more side effects.  Therefore a month ago he discontinued it.  Interestingly he still takes lithium and Klonopin.  He says the lithium was very helpful from keeping him from being explosive and being more self-destructive.  He does not really think of himself nor does he act irritable.  Taking Klonopin together with lithium seems to stabilize this individual.  Last time he was admitted was because he took a long walk please pick them up and took him to the emergency room.  From there he went on to the psychiatric hospital where he stayed for a period of time.  He is to be on Seroquel but he says that oversedates.  At this time he is resistant to taking any new medication.  I do think he is safe at this time.  I think as long as no one intrudes upon his home which is a private residence he will hurt anyone.  In fact I think this patient would not hurt anybody unless he felt he was being attacked.  He clearly has a lot of antigovernment feelings.  He has not taken the vaccine.  He seems to engage with me and seems pretty friendly in our discussions.  I believe he looks forward to talking with me.  We will talk again in the next 2 months.  The patient knows where to get help if he needs it.  He will continue taking all his medicines and I will continue prescribing  that.  Assessment and Plan:  This patient's first problem is that of paranoid schizophrenia.  At this time is not on any antipsychotic medicine.  He is resistant to them and having side effects from them.  He will however continue taking his lithium as prescribed.  I will continue his Klonopin 1 mg 3 times daily.  He takes it appropriately.  He will return to see me in a few months.   Virtual Visit via Telephone Note  I connected with Phillip [name] on 02/04/20 at  9:30 AM EDT by telephone and verified that I am speaking with the correct person using two identifiers.  Location: Patient:  At his home Provider:  office   I discussed the limitations, risks, security and privacy concerns of performing an evaluation and management service by telephone and the availability of in person appointments. I also discussed with the patient that there may be a patient responsible charge related to this service. The patient expressed understanding and agreed to proceed.    I discussed the assessment and treatment plan with the patient. The patient was provided an opportunity to ask questions and all were answered. The patient agreed with the plan and demonstrated an understanding of the instructions.   The  patient was advised to call back or seek an in-person evaluation if the symptoms worsen or if the condition fails to improve as anticipated.  I provided 30 minutes of non-face-to-face time during this encounter.   Phillip Balsam, MD

## 2020-01-07 ENCOUNTER — Encounter (HOSPITAL_COMMUNITY): Payer: Self-pay | Admitting: Psychiatry

## 2020-01-09 ENCOUNTER — Other Ambulatory Visit (HOSPITAL_COMMUNITY): Payer: Self-pay | Admitting: *Deleted

## 2020-01-09 ENCOUNTER — Telehealth (HOSPITAL_COMMUNITY): Payer: Self-pay | Admitting: *Deleted

## 2020-01-09 MED ORDER — CARIPRAZINE HCL 3 MG PO CAPS: 3.0000 mg | ORAL_CAPSULE | Freq: Every day | ORAL | 5 refills | Status: DC

## 2020-01-09 NOTE — Telephone Encounter (Signed)
Writer spoke with pt regarding new Rx for Vrylar as ordered by Dr. Donell Beers. Pt states that he has spoken with Dr. about this today as well. S/E and length of time to work discussed with pt. Pt instructed to call with any s/e or c/o after starting medication. Pt was hyperverbal and says he really wants to get mood stabilized. Pt receptive. Verbalizes understanding.

## 2020-03-12 ENCOUNTER — Telehealth (HOSPITAL_COMMUNITY): Payer: Medicare Other | Admitting: Psychiatry

## 2020-03-12 ENCOUNTER — Other Ambulatory Visit: Payer: Self-pay

## 2020-04-02 ENCOUNTER — Telehealth (INDEPENDENT_AMBULATORY_CARE_PROVIDER_SITE_OTHER): Payer: Medicare Other | Admitting: Psychiatry

## 2020-04-02 ENCOUNTER — Other Ambulatory Visit: Payer: Self-pay

## 2020-04-02 DIAGNOSIS — F332 Major depressive disorder, recurrent severe without psychotic features: Secondary | ICD-10-CM | POA: Diagnosis not present

## 2020-04-02 DIAGNOSIS — F3160 Bipolar disorder, current episode mixed, unspecified: Secondary | ICD-10-CM | POA: Diagnosis not present

## 2020-04-02 DIAGNOSIS — F319 Bipolar disorder, unspecified: Secondary | ICD-10-CM

## 2020-04-02 MED ORDER — CARIPRAZINE HCL 3 MG PO CAPS: 3.0000 mg | ORAL_CAPSULE | Freq: Every day | ORAL | 5 refills | Status: DC

## 2020-04-02 MED ORDER — CLONAZEPAM 1 MG PO TABS: ORAL_TABLET | ORAL | 3 refills | Status: DC

## 2020-04-02 MED ORDER — LITHIUM CARBONATE ER 300 MG PO TBCR: 600.0000 mg | EXTENDED_RELEASE_TABLET | Freq: Every evening | ORAL | 1 refills | Status: DC

## 2020-04-02 NOTE — Progress Notes (Signed)
Virtual Visit via Telephone Note  Today the patient seems to be at his baseline.  This patient's baseline is that his mood is up and down and that he has periods where he gets suicidal.  A few weeks ago he actually made a suicide attempt and cut himself.  He said he did not believe he and that he woke up on the floor the next day was okay.  The patient certainly seems to be very erratic in his mood state.  Today appears somewhat manic.  His speech is quick.  The patient does truly admit he wants help.  We had called in his Vyrlar but he never got through.  Unfortunately the patient did not contact us.  Today we will go ahead and call him and his Vraylar  once again 3 mg.  The patient says he want something to help his mood be more stable.  He has been taking lithium for years.  I suspect one of the things that allow him to be alive today's lithium which is therapeutic.  While he is hesitant to continue it alone I have encouraged him to continue taking the lithium and to go ahead and start on this new antipsychotic medicine.  Importantly the patient also now says he wants to be in therapy.  We will attempt to get the therapist at our office to see him.  We actually did have another therapist involved in the community but the patient would have to pay out of his pocket and apparently he could not afford that.  Patient lives independently on a relatively large acreage.  There is certainly some serious signs.  The patient has gotten rid of his cat Darin Engels because he does not want to be responsible for him.  The good news although is also the fact that he sold all of his guns.  He was afraid of being impulsive and acting alone using a gun.  He does describe one off suicidality and does not ask cannot explain why it occurs.  In the last few days he has not been suicidal and has made no suicide attempts.  In the last few minutes he shared with me a discussion where he apparently was arrested in a [employer].  It is not  clear exactly why but he was released and has some court trials coming up.  The patient has an attorney assigned to him and the patient insists that he is not going to lie and say that something happened when he did not.  The patient simply was at [facility] he says the last few minutes this door was open and they were trying to get him to leave.  He was asking for 10 more minutes to just finish his shopping but he says the manager of the others reacted to them.  They told him if he did not leave they called the police he said go ahead and call them and they did.  He said that he was then [facility] handcuffed and taken to jail.  He was released a short time after what his own reconnaissance.  The patient has a date coming up soon. Assessment and Plan:  At this time the patient will begin 3 mg of Vraylar and I will attempt to get a therapist working.  He will continue taking his lithium and Klonopin as prescribed.  I think his Klonopin has been extremely helpful over the years as it is the lithium.  I think his mood is just destabilized due to  reasons I am not clear.  Nonetheless the addition of his new antipsychotic medicine I hope will be helpful.  However return appointment to see me in approximately 3 weeks.  He was told to call us if there are any problems.   Virtual Visit via Telephone Note  I connected with Decarlos Empey on 04/02/20 at 11:30 AM EST by telephone and verified that I am speaking with the correct person using two identifiers.  Location: Patient:  At his home Provider:  office   I discussed the limitations, risks, security and privacy concerns of performing an evaluation and management service by telephone and the availability of in person appointments. I also discussed with the patient that there may be a patient responsible charge related to this service. The patient expressed understanding and agreed to proceed.    I discussed the assessment and treatment plan with the  patient. The patient was provided an opportunity to ask questions and all were answered. The patient agreed with the plan and demonstrated an understanding of the instructions.   The patient was advised to call back or seek an in-person evaluation if the symptoms worsen or if the condition fails to improve as anticipated.  I provided 30 minutes of non-face-to-face time during this encounter.   Gypsy Balsam, MD

## 2020-04-26 ENCOUNTER — Other Ambulatory Visit: Payer: Self-pay

## 2020-04-26 ENCOUNTER — Ambulatory Visit (INDEPENDENT_AMBULATORY_CARE_PROVIDER_SITE_OTHER): Payer: Medicare Other | Admitting: Licensed Clinical Social Worker

## 2020-04-26 DIAGNOSIS — F3164 Bipolar disorder, current episode mixed, severe, with psychotic features: Secondary | ICD-10-CM | POA: Diagnosis not present

## 2020-04-26 DIAGNOSIS — F4310 Post-traumatic stress disorder, unspecified: Secondary | ICD-10-CM | POA: Diagnosis not present
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
## 2020-04-30 ENCOUNTER — Other Ambulatory Visit: Payer: Self-pay

## 2020-04-30 ENCOUNTER — Telehealth (INDEPENDENT_AMBULATORY_CARE_PROVIDER_SITE_OTHER): Payer: 59 | Admitting: Psychiatry

## 2020-04-30 DIAGNOSIS — F319 Bipolar disorder, unspecified: Secondary | ICD-10-CM

## 2020-04-30 DIAGNOSIS — F332 Major depressive disorder, recurrent severe without psychotic features: Secondary | ICD-10-CM

## 2020-04-30 MED ORDER — LITHIUM CARBONATE ER 300 MG PO TBCR: 600.0000 mg | EXTENDED_RELEASE_TABLET | Freq: Every evening | ORAL | 1 refills | Status: DC

## 2020-04-30 MED ORDER — CLONAZEPAM 1 MG PO TABS: ORAL_TABLET | ORAL | 3 refills | Status: DC

## 2020-04-30 NOTE — Progress Notes (Signed)
Virtual Visit via Telephone Note  Today    Today the patient is doing amazingly well.  He is made a number of advancements.  The first is is that he is engaged with our therapist Kandee Keen and feels he can trust him.  They had a long discussion together and they plan to continue to work.  Is very hard for this patient to trust anyone.  Further the patient has made a number of advances in his own self-care.  He is seeing the dentist for the first time in 8 years.  He has ongoing appointments with the dentist to deal with his cavities.  The patient has also stopped smoking for the last 1 month.  The patient got a fit bit.  He has sold his TV to his church and listens to music on his radio.  He is got about his cat Darin Engels.  The patient feels much better about himself.  He is very spiritual.  He goes online for his church.  Tomorrow the patient is going to begin on his Vraylar.  He also has gotten new insurance which pays for his lithium and Klonopin and his Vraylar.  The patient denies any psychotic symptoms.  His mood is good.  He is sleeping and eating very well.  He is got good energy.   Virtual Visit via Telephone Note   Assessment/plan  This patient is diagnosis is that of schizophrenia.  He will begin 1 Vraylar 3 mg tomorrow morning.  Continue taking lithium and Klonopin as prescribed.  Patient is doing much better.  He is much more stable.  He will be seen again by me in 2-1/2 months.   Gypsy Balsam, MD

## 2020-05-03 ENCOUNTER — Other Ambulatory Visit: Payer: Self-pay

## 2020-05-03 ENCOUNTER — Ambulatory Visit (INDEPENDENT_AMBULATORY_CARE_PROVIDER_SITE_OTHER): Payer: 59 | Admitting: Licensed Clinical Social Worker

## 2020-05-03 DIAGNOSIS — F4310 Post-traumatic stress disorder, unspecified: Secondary | ICD-10-CM | POA: Diagnosis not present

## 2020-05-03 DIAGNOSIS — F3164 Bipolar disorder, current episode mixed, severe, with psychotic features: Secondary | ICD-10-CM | POA: Diagnosis not present

## 2020-05-03 NOTE — Progress Notes (Signed)
Virtual Visit via Video Note   I connected with Phillip [name] on 05/03/20 at 10:00am by video enabled telemedicine application and verified that I am speaking with the correct person using two identifiers.   I discussed the limitations, risks, security and privacy concerns of performing an evaluation and management service by telephone and the availability of in person appointments. I also discussed with the patient that there may be a patient responsible charge related to this service. The patient expressed understanding and agreed to proceed.   I discussed the assessment and treatment plan with the patient. The patient was provided an opportunity to ask questions and all were answered. The patient agreed with the plan and demonstrated an understanding of the instructions.   The patient was advised to call back or seek an in-person evaluation if the symptoms worsen or if the condition fails to improve as anticipated.   I provided 1 hour of non-face-to-face time during this encounter.     Shade Flood, LCSW, LCAS __________________________ THERAPIST PROGRESS NOTE   Session Time: 10:00am - 11:00am  Location: Patient: Patient Home Provider: Clinical Home Office    Participation Level: Active   Behavioral Response: Alert, casually dressed, euthymic mood/affect   Type of Therapy:  Individual Therapy   Treatment Goals addressed: Anxiety, depression, and panic attack management; Self care routine; Psychiatry followup; Medication compliance; Exercise; Socialization; [place] engagement.   Interventions: CBT, gratitude exercises    Summary: [name] is a 50 year old divorced Caucasian male that presented for virtual session today and is diagnosed with Bipolar I, most recent episode mixed, severe with psychotic behavior and PTSD.    Suicidal/Homicidal: None; without intent or plan    Therapist Response: Clinician met with [name] for virtual appointment and assessed for safety,  sobriety, and medication compliance.  Terri presented for today's session on time and was alert, oriented x5, with no evidence or self-report of active SI/HI or A/V H.  [name] reported ongoing compliance with medication and denied alcohol or illicit substance abuse.  Clinician inquired about Phillip [name]'s emotional ratings today, as well as any significant changes in thoughts, feelings, or behaviors since completion of assessment.  [name] reported scores of 1/10 for depression, 4/10 for anxiety, 0/10 for anger/irritability, and denied any reoccurrence of panic attacks.  [name] reported that things have been going well overall in the past few days, as he met with his psychiatrist for scheduled appointment, scheduled and attended a dentist visit, attended a virtual church sermon, and went out to a few thrift stores with his girlfriend as well, stating "I've tried to make some positive changes and its been really exciting".  [name] reported that he is actively working to improve his outlook at this time and "Look for blessings each day", since this has helped significantly with mood.  Clinician was supportive of this goal, and explained benefits of regular gratitude practice with [name], including examples which he could add into his developing self-care routine, such as maintaining a daily gratitude journal, giving thanks to higher power and/or positive supports, taking mindfulness walks, or writing a gratitude letter to someone he cares about.  Interventions were effective, as evidenced by [name] expressing receptiveness to several of these ideas, including modifying his journal to include gratitude practice, writing a letter to his girlfriend expressing thanks for how she supports him with his mental health challenges, and taking mindfulness walks in the woods around his home to take in the beauty of nature, increase physical activity, and clear  his mind of anxieties.  [name] stated "I think I'm starting to learn that  I'm in control of a lot more things in my life than I realized and these are the kinds of skills I want to focus on so I can enjoy my life more fully".  Clinician will continue to monitor.  Plan: Follow up again in 1 week virtually.    Diagnosis:  Bipolar I, most recent episode mixed, severe with psychotic behavior and PTSD.     Shade Flood, [place], [place] 05/03/20

## 2020-05-10 ENCOUNTER — Ambulatory Visit (INDEPENDENT_AMBULATORY_CARE_PROVIDER_SITE_OTHER): Payer: 59 | Admitting: Licensed Clinical Social Worker

## 2020-05-10 ENCOUNTER — Other Ambulatory Visit: Payer: Self-pay

## 2020-05-10 DIAGNOSIS — F4310 Post-traumatic stress disorder, unspecified: Secondary | ICD-10-CM | POA: Diagnosis not present

## 2020-05-10 DIAGNOSIS — F3164 Bipolar disorder, current episode mixed, severe, with psychotic features: Secondary | ICD-10-CM

## 2020-05-10 NOTE — Progress Notes (Signed)
Virtual Visit via Video Note   I connected with Phillip [name] on 05/10/20 at 10:00am by video enabled telemedicine application and verified that I am speaking with the correct person using two identifiers.   I discussed the limitations, risks, security and privacy concerns of performing an evaluation and management service by telephone and the availability of in person appointments. I also discussed with the patient that there may be a patient responsible charge related to this service. The patient expressed understanding and agreed to proceed.   I discussed the assessment and treatment plan with the patient. The patient was provided an opportunity to ask questions and all were answered. The patient agreed with the plan and demonstrated an understanding of the instructions.   The patient was advised to call back or seek an in-person evaluation if the symptoms worsen or if the condition fails to improve as anticipated.   I provided 50 minutes of non-face-to-face time during this encounter.     Shade Flood, LCSW, LCAS __________________________ THERAPIST PROGRESS NOTE   Session Time: 10:00am - 10:50am  Location: Patient: Patient Home Provider: Clinical Home Office    Participation Level: Active   Behavioral Response: Alert, casually dressed, depressed mood/affect   Type of Therapy:  Individual Therapy   Treatment Goals addressed: Anxiety, depression, and panic attack management; Self care routine; Psychiatry followup; Medication compliance   Interventions: CBT, pain and nutrition assessments, 5-4-3-2-1 grounding    Summary: Phillip [name] is a 50 year old divorced Caucasian male that presented for virtual session today and is diagnosed with Bipolar I, most recent episode mixed, severe with psychotic behavior and PTSD.    Suicidal/Homicidal: None; without intent or plan    Therapist Response: Clinician met with Phillip [name] for virtual session and assessed for safety, sobriety, and  medication compliance.  Quentin presented for today's appointment on time and was alert, oriented x5, with no evidence or self-report of active SI/HI or A/V H.  [name] reported that he continues taking medication responsibly, with the exception of Vraylar, noting that he is still apprehensive about whether he wants to try it or not.  Clinician encouraged him to speak with psychiatrist about concerns for guidance with this issue.  Willia denied alcohol or illicit substance abuse.  Clinician inquired about [name]'s current emotional ratings, as well as any significant changes in thoughts, feelings, or behaviors since last check-in.  Phillip [name] reported scores of 5/10 for depression, 2/10 for anxiety, 0/10 for anger/irritability, and experienced x4-5 panic attacks over past week. Phillip [name] reported that recent successes included helping a neighbor with moving, and attending a bingo game with his girlfriend, stating "I couldn't have imagined being around that many people and not being anxious".  He reported that he has also continued dialogue with his girlfriend about planning a road trip, and buying bicycles together in order to increase exercise. Clinician praised [name] for recent outgoing activities and encouraged him to continue taking advantage of these opportunities to aid in anxiety desensitization.  Phillip [name] reported that he has also been thinking about going back to working, but worries that he doesn't have coping skills to handle such a setting or stressors due to ongoing panic attacks.  Clinician offered to teach [name] 5-4-3-2-1 grounding technique today in order to aid in this goal by utilizing all 5 senses to temporarily distract from distressing thoughts and feelings.  Phillip [name] was receptive to this idea, so clinician guided him through process of using his environment to identify 5 things he could see,  4 things he could touch, 3 things he could hear, 2 things he could smell, and 1 thing he could taste.   Interventions were effective, as evidenced by Phillip [name] successfully participating in exercise, and noting that he feels confident that this could benefit him in managing panic episodes, stating "I feel like this is really advantageous towards my goals, I can use this if I go back to work so I can handle things without stressing out". Clinician observed Phillip [name] writing notes down on this technique in session, and encouraged him to practice it daily for retention.  Clinician also competed updated pain and nutritional assessments with Phillip [name] which indicated no present issues, but will continue to monitor.  Plan: Follow up again in 1 week virtually.    Diagnosis:  Bipolar I, most recent episode mixed, severe with psychotic behavior and PTSD.     Shade Flood, LCSW, LCAS 05/10/20

## 2020-05-20 ENCOUNTER — Other Ambulatory Visit: Payer: Self-pay

## 2020-05-20 ENCOUNTER — Ambulatory Visit (INDEPENDENT_AMBULATORY_CARE_PROVIDER_SITE_OTHER): Payer: 59 | Admitting: Licensed Clinical Social Worker

## 2020-05-20 DIAGNOSIS — F4310 Post-traumatic stress disorder, unspecified: Secondary | ICD-10-CM | POA: Diagnosis not present

## 2020-05-20 DIAGNOSIS — F3164 Bipolar disorder, current episode mixed, severe, with psychotic features: Secondary | ICD-10-CM | POA: Diagnosis not present

## 2020-05-20 NOTE — Progress Notes (Signed)
Virtual Visit via Video Note   I connected with Phillip [name] on 05/20/20 at 10:00am by video enabled telemedicine application and verified that I am speaking with the correct person using two identifiers.   I discussed the limitations, risks, security and privacy concerns of performing an evaluation and management service by telephone and the availability of in person appointments. I also discussed with the patient that there may be a patient responsible charge related to this service. The patient expressed understanding and agreed to proceed.   I discussed the assessment and treatment plan with the patient. The patient was provided an opportunity to ask questions and all were answered. The patient agreed with the plan and demonstrated an understanding of the instructions.   The patient was advised to call back or seek an in-person evaluation if the symptoms worsen or if the condition fails to improve as anticipated.   I provided 1 hour of non-face-to-face time during this encounter.     Phillip Flood, LCSW, LCAS __________________________ THERAPIST PROGRESS NOTE   Session Time: 10:00am - 11:00am  Location: Patient: Patient Home Provider: Clinical Home Office    Participation Level: Active   Behavioral Response: Alert, casually dressed, anxious mood/affect   Type of Therapy:  Individual Therapy   Treatment Goals addressed: Anxiety, depression, and panic attack management; Self care routine; Medication compliance   Interventions: CBT: challenging irrational thoughts/core beliefs   Summary: Phillip [name] is a 50 year old divorced Caucasian male that presented for virtual session today and is diagnosed with Bipolar I, most recent episode mixed, severe with psychotic behavior and PTSD.    Suicidal/Homicidal: None; without intent or plan    Therapist Response: Clinician met with Phillip [name] for virtual appointment and assessed for safety, sobriety, and medication compliance.  Phillip [name]  presented for today's session on time and was alert, oriented x5, with no evidence or self-report of active SI/HI or A/V H.  Phillip [name] reported ongoing compliance with medication, with the exception of Vraylar since he wants to speak with psychiatrist first about concerns he has.  Phillip [name] denied alcohol or illicit substance use.  Clinician inquired about Phillip [name]'s emotional ratings today, as well as any significant changes in thoughts, feelings, or behaviors since previous check-in.  Phillip [name] reported scores of 7/10 for depression, 7/10 for anxiety, 0/10 for anger/irritability, and experienced x5 panic attacks over last week. Phillip [name] reported that he has continued setting aside time for self-care each day, but has been more irritable over the past week due to rumination upon belief that he is under surveillance from the government, which distresses him because he feels that they will eventually have agents invade his home to arrest him.  Phillip [name] denied intent to harm anyone at this time, but acknowledged that if someone comes into his home uninvited and he perceives them as a threat he will defend himself with firearms, stating "I don't want to have to hurt anybody".  Clinician explored this belief with Phillip [name] in session and attempted to assist him in weighing available evidence that supports or disputes likelihood of it being true to promote rational thinking and challenge any present distortions.  Phillip [name] reported that he has held this belief for many years now, and previously went through his home, looking through all manner of electronic devices such as his microwave, TV, coffee machine and more, which led him to find numerous cameras and power supplies he believes were used to monitor him throughout the day.  Phillip [name] reported that he has these stored  in his home, and has shown them to the police and various supports such as his girlfriend and pastor, but feels his concerns weren't taken seriously, which has led him to  avoid discussing it with anyone else, stating "Everyone just thinks I'm crazy".  Phillip [name] reported that although he has 'done some things in the past' which could be considered illegal, he acknowledged that he has no reason to believe he should be tracked at present time or face persecution. Intervention effectiveness could not be measured at this time due to Phillip [name]'s guarded nature on his beliefs and lack of rapport within therapeutic relationship, with Phillip [name] stating "I'm still trying to decide whether I trust people enough to talk about these things.  I didn't even plan to bring it up, but I know that it has been bothering me a lot more lately".  Phillip [name] reported that he remains agreeable to voluntary hospitalization for safety should SI/HI appear along with development of intent and/or plan to harm himself or others.  Clinician will continue to monitor.  Plan: Follow up again in 1 week virtually.    Diagnosis:  Bipolar I, most recent episode mixed, severe with psychotic behavior and PTSD.     Phillip Flood, LCSW, LCAS 05/20/20

## 2020-05-25 ENCOUNTER — Telehealth (HOSPITAL_COMMUNITY): Payer: Self-pay | Admitting: Licensed Clinical Social Worker

## 2020-05-25 ENCOUNTER — Other Ambulatory Visit: Payer: Self-pay

## 2020-05-25 ENCOUNTER — Ambulatory Visit (HOSPITAL_COMMUNITY): Payer: 59 | Admitting: Licensed Clinical Social Worker

## 2020-05-25 NOTE — Telephone Encounter (Signed)
Phillip [name] had a virtual therapy session scheduled for 10am.  Clinician contacted him by phone at 10:06am when Phillip [name] had not presented on time despite text and email reminders that were sent.  Phillip [name] did not answer this outreach call, so clinician left a voicemail reminding him of this appointment, and included contact numbers for office and front desk staff.  Clinician ended virtual meeting at 10:15am when Phillip [name] had not appeared, and informed front desk staff of no-show event.    Noralee Stain, LCSW, LCAS 05/25/20

## 2020-06-01 ENCOUNTER — Ambulatory Visit (INDEPENDENT_AMBULATORY_CARE_PROVIDER_SITE_OTHER): Payer: 59 | Admitting: Licensed Clinical Social Worker

## 2020-06-01 ENCOUNTER — Other Ambulatory Visit: Payer: Self-pay

## 2020-06-01 DIAGNOSIS — F4310 Post-traumatic stress disorder, unspecified: Secondary | ICD-10-CM | POA: Diagnosis not present

## 2020-06-01 DIAGNOSIS — F3164 Bipolar disorder, current episode mixed, severe, with psychotic features: Secondary | ICD-10-CM

## 2020-06-01 NOTE — Progress Notes (Signed)
Virtual Visit via Video Note   I connected with Phillip [name] on 06/01/20 at 10:00am by video enabled telemedicine application and verified that I am speaking with the correct person using two identifiers.   I discussed the limitations, risks, security and privacy concerns of performing an evaluation and management service by telephone and the availability of in person appointments. I also discussed with the patient that there may be a patient responsible charge related to this service. The patient expressed understanding and agreed to proceed.   I discussed the assessment and treatment plan with the patient. The patient was provided an opportunity to ask questions and all were answered. The patient agreed with the plan and demonstrated an understanding of the instructions.   The patient was advised to call back or seek an in-person evaluation if the symptoms worsen or if the condition fails to improve as anticipated.   I provided 1 hour of non-face-to-face time during this encounter.     Shade Flood, LCSW, LCAS __________________________ THERAPIST PROGRESS NOTE   Session Time: 10:00am - 11:00am  Location: Patient: Patient Home Provider: Clinical Home Office    Participation Level: Active   Behavioral Response: Alert, casually dressed, depressed mood/affect   Type of Therapy:  Individual Therapy   Treatment Goals addressed: Anxiety, depression, and panic attack management; Self care routine; Medication compliance   Interventions: CBT: exploring coping skills/self-care activities    Summary: Phillip [name] is a 50 year old divorced Caucasian male that presented for virtual session today and is diagnosed with Bipolar I, most recent episode mixed, severe with psychotic behavior and PTSD.    Suicidal/Homicidal: None; without intent or plan    Therapist Response: Clinician met with Phillip [name] for virtual session and assessed for safety, sobriety, and medication compliance.  Phillip [name]  presented for today's appointment on time and was alert, oriented x5, with no evidence or self-report of active SI/HI or A/V H.  Phillip [name] apologized for missing previous session and reported that he had conflicting appointments and forgot to call ahead.  Clinician reminded him of policy regarding no-shows and warned of discharge if two more events occur.  Phillip [name] acknowledged understanding of policy.  Phillip [name] reported ongoing compliance with medication, with the exception of Vraylar which he will discuss with psychiatrist in 1 months.  Phillip [name] denied alcohol or illicit substance use.  Clinician inquired about Phillip [name]'s current emotional ratings, as well as any significant changes in thoughts, feelings, or behaviors since last check-in.  Phillip [name] reported scores of 5/10 for depression, 3/10 for anxiety, 0/10 for anger/irritability, and continues to experience panic attacks x1-2 times per day. Phillip [name] reported that he did attend an appointment with his dentist to have work done, and has been trying to get out of the house more often, visiting a shoe store and thrift stores with his girlfriend most recently. Phillip [name] also reported that he has been working on his cross stitch project in free time, but is nearing completion and wants to find new ideas for hobbies to engage in for self-care and healthy distraction.  Clinician went through a comprehensive self-care activity list with Phillip [name] to discuss additional ideas he could explore in days ahead, including examples such as doing puzzles, playing board games, drawing/painting/sketching, gardening, crocheting, yoga, and more.  Intervention was effective, as evidenced by Phillip [name] engaging in discussion on this subject and expressing open mindedness to several ideas, such as building a model airplane, carpentry, reading, playing bingo, starting a small garden, doing sketches, or going out on  a canoe. [name] stated "I have the hope, the drive, and spark.  I just need to apply it so I  don't give in to that feeling of not wanting to do anything each day".  [name] also reported that this got him interested in hobbies which could offer exposure to social interactions and curb related anxieties.  Clinician will continue to monitor.  Plan: Follow up again in 1 week virtually.    Diagnosis:  Bipolar I, most recent episode mixed, severe with psychotic behavior and PTSD.     Shade Flood, LCSW, LCAS 06/01/20

## 2020-06-07 ENCOUNTER — Ambulatory Visit (INDEPENDENT_AMBULATORY_CARE_PROVIDER_SITE_OTHER): Payer: 59 | Admitting: Licensed Clinical Social Worker

## 2020-06-07 ENCOUNTER — Telehealth (HOSPITAL_COMMUNITY): Payer: Self-pay | Admitting: Psychiatry

## 2020-06-07 ENCOUNTER — Other Ambulatory Visit: Payer: Self-pay

## 2020-06-07 DIAGNOSIS — F3164 Bipolar disorder, current episode mixed, severe, with psychotic features: Secondary | ICD-10-CM | POA: Diagnosis not present

## 2020-06-07 DIAGNOSIS — F4310 Post-traumatic stress disorder, unspecified: Secondary | ICD-10-CM

## 2020-06-07 NOTE — Progress Notes (Signed)
Virtual Visit via Video Note   I connected with Phillip [name] on 06/07/20 at 10:00am by video enabled telemedicine application and verified that I am speaking with the correct person using two identifiers.   I discussed the limitations, risks, security and privacy concerns of performing an evaluation and management service by video and the availability of in person appointments. I also discussed with the patient that there may be a patient responsible charge related to this service. The patient expressed understanding and agreed to proceed.   I discussed the assessment and treatment plan with the patient. The patient was provided an opportunity to ask questions and all were answered. The patient agreed with the plan and demonstrated an understanding of the instructions.   The patient was advised to call back or seek an in-person evaluation if the symptoms worsen or if the condition fails to improve as anticipated.   I provided 1 hour of non-face-to-face time during this encounter.     Shade Flood, LCSW, LCAS __________________________ THERAPIST PROGRESS NOTE   Session Time: 10:00am - 11:00am  Location: Patient: Patient Home Provider: Clinical Home Office    Participation Level: Active   Behavioral Response: Alert, casually dressed, depressed mood/affect   Type of Therapy:  Individual Therapy   Treatment Goals addressed: Anxiety, depression, and panic attack management; Medication compliance; Crisis planning    Interventions: CBT, safety planning, referral to higher level of care   Summary: Phillip [name] is a 50 year old divorced Caucasian male that presented for virtual session today and is diagnosed with Bipolar I, most recent episode mixed, severe with psychotic behavior and PTSD.    Suicidal/Homicidal: None; without intent or plan    Therapist Response: Clinician met with Phillip [name] for virtual appointment and assessed for safety, sobriety, and medication compliance.  Phillip [name]  presented for today's session on time and was alert, oriented x5, with no evidence or self-report of active A/V H.  Phillip [name] reported that he still hasn't started his Vraylar.  Phillip [name] denied alcohol or illicit substance use.  Clinician inquired about Phillip [name]'s emotional ratings today, as well as any significant changes in thoughts, feelings, or behaviors since previous check-in.  Phillip [name] reported scores of 10/10 for depression, 10/10 for anxiety, 0/10 for anger/irritability, and continues to experience panic attacks several times per day.  Phillip [name] reported that he has felt more depressed, anxious and paranoid over last week, noting that he got into an argument with his girlfriend, has not gotten out of the house, and has been isolating more.  Phillip [name] acknowledged that he is also sleeping during the day, and staying up late at night.  He reported that he experienced SI at 7am this morning when he couldn't sleep, stating "I had this thought that it'd be a good time to shoot myself".  He reported that it was a brief thought and stated "I just let it go because I know its not an option".  Phillip [name] denied experiencing any SI, plan or intent at present, and voluntarily revisited suicide risk safety plan with clinician today to make updates (see in chart).  Phillip [name] remains resistant to idea of removing weapons from household due to concern that he needs access to these to protect himself if someone should break into his home.  He stated "I promise you I will not hurt myself, and if I did have a crisis I would call a suicide hotline and speak to somebody until I was in a better place".  He also reported that he  remains agreeable to seeking hospitalization as a last resort if coping skills prove insufficient in curbing SI, stating "I know the hospital is a safeguard to keep me from hurting myself.  Clinician discussed appropriateness for Phillip [name] to consider stepping up to higher level of care for closer monitoring and structure via  group therapy programs offered through our clinic, as well as starting Vraylar as recommended by his psychiatrist to help with symptoms.  Phillip [name] was initially reluctant to consider this, noting that he is shy and doesn't know what to say around other people, but became more open minded as clinician discussed potential benefits.  Phillip [name] also expressed receptiveness to starting medication finally, noting that he is tired of dealing with paranoia and agreed to take it after today's session concluded.  Intervention was effective, as evidenced by Phillip [name] reporting confidence in safety plan, greater motivation to follow through with medication compliance, and interest in referral for group therapy, stating "I have to feel better.  I want to do more with my life instead of beating around the bush.  Speaking to you really helps me, it gets me out of my head and makes me realize that I'm not alone in this".  Clinician will continue to monitor and outreach case manager for group therapy referral today.  Plan: Follow up again in 1 week virtually.    Diagnosis:  Bipolar I, most recent episode mixed, severe with psychotic behavior and PTSD.     Shade Flood, [place], LCAS 06/07/20

## 2020-06-14 ENCOUNTER — Other Ambulatory Visit: Payer: Self-pay

## 2020-06-14 ENCOUNTER — Telehealth (HOSPITAL_COMMUNITY): Payer: Self-pay | Admitting: Licensed Clinical Social Worker

## 2020-06-14 ENCOUNTER — Ambulatory Visit (HOSPITAL_COMMUNITY): Payer: 59 | Admitting: Licensed Clinical Social Worker

## 2020-06-14 NOTE — Telephone Encounter (Signed)
Phillip [name] had a virtual therapy session scheduled for 10am today.  Clinician outreached him by phone at 10:08am when Phillip [name] did not present on time despite email and text reminders that were sent. This phone call went unanswered and a voicemail could not be left at this time.  Clinician ended virtual meeting at 10:15am when Phillip [name] had still not presented nor returned call, and clinician informed front desk staff of no-show event.    Noralee Stain, LCSW, LCAS 06/14/20

## 2020-06-21 ENCOUNTER — Other Ambulatory Visit: Payer: Self-pay

## 2020-06-21 ENCOUNTER — Ambulatory Visit (INDEPENDENT_AMBULATORY_CARE_PROVIDER_SITE_OTHER): Payer: 59 | Admitting: Licensed Clinical Social Worker

## 2020-06-21 DIAGNOSIS — F3164 Bipolar disorder, current episode mixed, severe, with psychotic features: Secondary | ICD-10-CM

## 2020-06-21 DIAGNOSIS — F4310 Post-traumatic stress disorder, unspecified: Secondary | ICD-10-CM | POA: Diagnosis not present

## 2020-06-21 NOTE — Progress Notes (Signed)
Virtual Visit via Video Note   I connected with Phillip [name] on 06/21/20 at 10:00am by video enabled telemedicine application and verified that I am speaking with the correct person using two identifiers.   I discussed the limitations, risks, security and privacy concerns of performing an evaluation and management service by video and the availability of in person appointments. I also discussed with the patient that there may be a patient responsible charge related to this service. The patient expressed understanding and agreed to proceed.   I discussed the assessment and treatment plan with the patient. The patient was provided an opportunity to ask questions and all were answered. The patient agreed with the plan and demonstrated an understanding of the instructions.   The patient was advised to call back or seek an in-person evaluation if the symptoms worsen or if the condition fails to improve as anticipated.   I provided 35 minutes of non-face-to-face time during this encounter.     Phillip Flood, LCSW, LCAS __________________________ THERAPIST PROGRESS NOTE   Session Time: 10:00am - 10:35am  Location: Patient: Patient Home Provider: [facility]    Participation Level: Active   Behavioral Response: Alert, casually dressed, depressed mood/affect   Type of Therapy:  Individual Therapy   Treatment Goals addressed: Anxiety, depression, and panic attack management; Medication compliance   Interventions: CBT, referral to higher level of care   Summary: Phillip [name] is a 50 year old divorced Caucasian male that presented for therapy session today and is diagnosed with Bipolar I, most recent episode mixed, severe with psychotic behavior and PTSD.    Suicidal/Homicidal: None; without intent or plan    Therapist Response: Clinician met with Phillip [name] for virtual session and assessed for safety, sobriety, and medication compliance.  Phillip [name] presented for today's appointment on time  and was alert, oriented x5, with no evidence or self-report of active SI/HI or A/V H.  Phillip [name] reported that he is compliant with medication, including Vraylar, but denies any noticeable change in mood as a result of starting medication 2 weeks ago.  Phillip [name] denied alcohol or illicit substance use.  Clinician inquired about Phillip [name]'s emotional ratings today, as well as any significant changes in thoughts, feelings, or behaviors since previous check-in.  Phillip [name] reported scores of 7/10 for depression, 0/10 for anxiety, 0/10 for anger/irritability, and is experiencing 3-4x panic attacks per day.  Phillip [name] reported that he has not left the house since last session, and has been sleeping much of the day, averaging 18-20 hours some days  He reported that his relationship has also not been going well, as they haven't spent as much time together, and he feels at times like he should push her away so he can be completely alone, stating "I think a lot about travelling somewhere else in the [place] and just getting away, but I don't think that would actually make a difference".  Phillip [name] reported that he has been weighing the pros and cons of moving to places like [place] to keep himself from acting impulsively, stating "I think I'm just trying to get away from myself, but I know it would probably just get worse".  Clinician revisited referral made to Phillip [name] for group therapy 2 weeks ago, and Phillip [name] acknowledged that he did receive outreach call from case manager about this, but never followed up due to uncertainty about whether he could follow through on decision.  Clinician assisted Phillip [name] in running cost benefit analysis of starting to attend [facility] groups this  week in order to increase available support, expand upon coping skills, and make adjustments to current medication due to ineffectiveness of current regimen.  Intervention was effective, as evidenced by Phillip [name] expressing increased receptiveness to starting groups  following this discussion, stating "I thinking that maybe its not so good for me to be so isolated.  I used to have tons of friends, but shut all of them out because it was hard to be happy around them.  I still have the number for the case manager in my phone and will call her today to talk about starting.  This helped me realize that there are people out there that want to help, and just knowing that makes me feel better".  Clinician will inform [facility] case manager of referral today.  Clinician also warned Phillip [name] that the individual session he missed on 06/14/20 is his second no-show event and could lead to discharge if he doesn't call ahead next time.  Phillip [name] acknowledged understanding of policy and apologized, promising to be more accountable.  Clinician will continue to monitor.  Plan: Follow up again in 1 week virtually.    Diagnosis:  Bipolar I, most recent episode mixed, severe with psychotic behavior and PTSD.     Phillip Flood, LCSW, LCAS 06/21/20

## 2020-07-20 ENCOUNTER — Other Ambulatory Visit: Payer: Self-pay

## 2020-07-20 ENCOUNTER — Telehealth (INDEPENDENT_AMBULATORY_CARE_PROVIDER_SITE_OTHER): Payer: 59 | Admitting: Psychiatry

## 2020-07-20 DIAGNOSIS — F332 Major depressive disorder, recurrent severe without psychotic features: Secondary | ICD-10-CM

## 2020-07-20 DIAGNOSIS — F3130 Bipolar disorder, current episode depressed, mild or moderate severity, unspecified: Secondary | ICD-10-CM

## 2020-07-20 DIAGNOSIS — F319 Bipolar disorder, unspecified: Secondary | ICD-10-CM

## 2020-07-20 MED ORDER — LISDEXAMFETAMINE DIMESYLATE 30 MG PO CAPS: ORAL_CAPSULE | ORAL | 0 refills | Status: DC

## 2020-07-20 MED ORDER — LITHIUM CARBONATE ER 300 MG PO TBCR: 600.0000 mg | EXTENDED_RELEASE_TABLET | Freq: Every evening | ORAL | 5 refills | Status: DC

## 2020-07-20 MED ORDER — CLONAZEPAM 1 MG PO TABS: ORAL_TABLET | ORAL | 3 refills | Status: DC

## 2020-07-20 NOTE — Progress Notes (Signed)
Virtual Visit via Telephone Note  Today    Today the patient is doing amazingly well.  He is made a number of advancements.  The first is is that he is engaged with our therapist Kandee Keen and feels he can trust him.  They had a long discussion together and they plan to continue to work.  Is very hard for this patient to trust anyone.  Further the patient has made a number of advances in his own self-care.  He is seeing the dentist for the first time in 8 years.  He has ongoing appointments with the dentist to deal with his cavities.  The patient has also stopped smoking for the last 1 month.  The patient got a fit bit.  He has sold his TV to his church and listens to music on his radio.  He is got about his cat Darin Engels.  The patient feels much better about himself.  He is very spiritual.  He goes online for his church.  Tomorrow the patient is going to begin on his Vraylar.  He also has gotten new insurance which pays for his lithium and Klonopin and his Vraylar.  The patient denies any psychotic symptoms.  His mood is good.  He is sleeping and eating very well.  He is got good energy.   Virtual Visit via Telephone Note   Assessment/plan  This patient is diagnosis is that of schizophrenia.  He will begin 1 Vraylar 3 mg tomorrow morning.  Continue taking lithium and Klonopin as prescribed.  Patient is doing much better.  He is much more stable.  He will be seen again by me in 2-1/2 months.   Gypsy Balsam, MD

## 2020-07-20 NOTE — Progress Notes (Signed)
Virtual Visit via Telephone Note  Today    Today the patient is doing only fairly well.  In many ways he is actually doing okay.  The biggest thing is he has a good relationship with his therapist Denyse Amass.  Apparently together they determined that he would do better if released a period of time in the intensive outpatient therapy daily with a number of people perhaps 2 or 3 for a few hours.  We will do it all worked really course.  The patient is a little bit irritable and a little frightened of this change.  I strongly encouraged him to follow the plan to make a phone call to start this program.  On the other hand in a lot of ways he is doing well.  He saw the dentist multiple times and has done his dental care.  He accepted all services as well.  He is no longer smoking cigarettes.  He still has not filled it.  Patient still listens to music on the radio.  His cat's name is Darin Engels is a proud father of a number of kittens.  The patient still goes online for church.  Today patient had a discussion about wishing to be back on Vyvanse.  He described Vyvanse using it for approximately 10 months in [place] and even in this community for about 5 months.  While I am certainly concerned that it is a stimulant the patient says its effects on him is that it makes him calmer more even and even mildly sedated.  This is classic for patient who has an attention disorder.  In essence the Vyvanse made focus more and be more at peace.  He did not produce paranoia or any psychotic symptomatology.  But wishes to continue making alliance with this patient.  We will go ahead and start him on Vyvanse at the dose he took in the past 30 mg and to return to see me in person in 2 months.  Patient is not suicidal.  He is not homicidal.  He has no evidence of hallucinatory activity.  His thoughts seem to be clear and organized.  Overall patient certainly seems better than he was a year ago at this time. Virtual Visit via Telephone  Note   Assessment/plan  At this time the patient will begin on Vyvanse 30 mg.  He will continue taking his Klonopin and lithium as ordered.  We will discontinue his Vraylar.  He will continue in therapy either with Denyse Amass or in an intensive outpatient program.  Patient return to see me in 2 months.  Overall I think the patient is doing well.   Gypsy Balsam, MD

## 2020-09-21 ENCOUNTER — Telehealth (INDEPENDENT_AMBULATORY_CARE_PROVIDER_SITE_OTHER): Payer: 59 | Admitting: Psychiatry

## 2020-09-21 ENCOUNTER — Other Ambulatory Visit: Payer: Self-pay

## 2020-09-21 DIAGNOSIS — F319 Bipolar disorder, unspecified: Secondary | ICD-10-CM

## 2020-09-21 DIAGNOSIS — F332 Major depressive disorder, recurrent severe without psychotic features: Secondary | ICD-10-CM

## 2020-09-21 DIAGNOSIS — F3160 Bipolar disorder, current episode mixed, unspecified: Secondary | ICD-10-CM

## 2020-09-21 MED ORDER — CLONAZEPAM 1 MG PO TABS: ORAL_TABLET | ORAL | 3 refills | Status: DC

## 2020-09-21 MED ORDER — LISDEXAMFETAMINE DIMESYLATE 40 MG PO CAPS: ORAL_CAPSULE | ORAL | 0 refills | Status: DC

## 2020-09-21 MED ORDER — DOXEPIN HCL 50 MG PO CAPS: 50.0000 mg | ORAL_CAPSULE | Freq: Every day | ORAL | 1 refills | Status: DC

## 2020-09-21 MED ORDER — LITHIUM CARBONATE ER 300 MG PO TBCR: 600.0000 mg | EXTENDED_RELEASE_TABLET | Freq: Every evening | ORAL | 5 refills | Status: DC

## 2020-09-21 NOTE — Progress Notes (Signed)
Virtual Visit via Telephone Note  Today   Today the patient is doing very well.  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  The patient is functioning extremely well.  He recently applied and got into [employer].  He is going to have to get his associate degree in theology.  The patient still has not smoked any cigarettes.  He is very happy with life.  He goes to 3 different churches.  He does a lot of church activities.  He works out regularly.  He is eating extremely well.  He recently read his testimony in front of a large from the church.  He is a lot more alcohol and he feels more positive about himself.  As noted in August he will start at [facility].  He has no auditory visualizations.  He has no paranoia.  Psychosis seems to be pretty much resolved.  He started on Vyvanse and it is beneficial.  He takes 30 mg and he says it kind of wears off midday.  The patient also has been recently diagnosed and treated for Lyme disease.  This apparently has been successfully treated.  Once again the patient's mood is very good.  He is much less isolated.  He is engaging with his family.  Recently took a long train ride to [place] where he met with his family and spent a week or 2 there.  He is functioning extremely well. Virtual Visit via Telephone Note   Assessment/plan  This patient's diagnosis has been schizoaffective disorder.  At this time he is free of psychosis.  At this time he takes lithium and he will continue taking this medicine.  His second problem is an attention deficit disorder.  At this time we will increase his Vyvanse from 30 mg to a higher dose of 40 mg.  His third problem is an adjustment disorder with an anxious mood state.  He takes Klonopin 1 mg 3 times a day.  He is very reliable.  He drinks no alcohol and uses no drugs.  He is doing very well taking a higher dose of Vyvanse Klonopin and still takes lithium.  He  will return to see me hopefully in person in 3 months.  He is not suicidal.  He is very safe.  He is got very spiritually invested.   Jerral Ralph, MD

## 2020-10-19 ENCOUNTER — Other Ambulatory Visit (HOSPITAL_COMMUNITY): Payer: Self-pay

## 2020-10-19 MED ORDER — DOXEPIN HCL 25 MG PO CAPS: ORAL_CAPSULE | ORAL | 1 refills | Status: DC

## 2020-11-16 ENCOUNTER — Telehealth (HOSPITAL_COMMUNITY): Payer: Self-pay | Admitting: *Deleted

## 2020-11-16 NOTE — Telephone Encounter (Signed)
Received t/c from pharmacist Robin at [facility] in [place] who is asking for authorization to fill VyVanse 40 mg early. Script is to be filled on 11/19/20 however pt is leaving to go to [place] to [employer] before that date. Please call Zella Ball at the pharmacy if authorizing early fill. Thanks.

## 2020-11-23 ENCOUNTER — Inpatient Hospital Stay (HOSPITAL_COMMUNITY)
Admission: AD | Admit: 2020-11-23 | Discharge: 2020-11-29 | DRG: 885 | Disposition: A | Discharge status: Home / Self Care | Payer: 59 | Source: Other Acute Inpatient Hospital | Attending: Emergency Medicine | Admitting: Emergency Medicine

## 2020-11-23 ENCOUNTER — Other Ambulatory Visit: Payer: Self-pay

## 2020-11-23 ENCOUNTER — Encounter (HOSPITAL_COMMUNITY): Payer: Self-pay | Admitting: Emergency Medicine

## 2020-11-23 DIAGNOSIS — Z809 Family history of malignant neoplasm, unspecified: Secondary | ICD-10-CM | POA: Diagnosis not present

## 2020-11-23 DIAGNOSIS — Z888 Allergy status to other drugs, medicaments and biological substances status: Secondary | ICD-10-CM | POA: Diagnosis not present

## 2020-11-23 DIAGNOSIS — F419 Anxiety disorder, unspecified: Secondary | ICD-10-CM | POA: Diagnosis present

## 2020-11-23 DIAGNOSIS — F332 Major depressive disorder, recurrent severe without psychotic features: Principal | ICD-10-CM | POA: Diagnosis present

## 2020-11-23 DIAGNOSIS — F4001 Agoraphobia with panic disorder: Secondary | ICD-10-CM | POA: Diagnosis present

## 2020-11-23 DIAGNOSIS — Z79899 Other long term (current) drug therapy: Secondary | ICD-10-CM | POA: Diagnosis not present

## 2020-11-23 DIAGNOSIS — Z9151 Personal history of suicidal behavior: Secondary | ICD-10-CM | POA: Diagnosis not present

## 2020-11-23 DIAGNOSIS — F4310 Post-traumatic stress disorder, unspecified: Secondary | ICD-10-CM | POA: Diagnosis present

## 2020-11-23 DIAGNOSIS — F603 Borderline personality disorder: Secondary | ICD-10-CM | POA: Diagnosis present

## 2020-11-23 DIAGNOSIS — Z885 Allergy status to narcotic agent status: Secondary | ICD-10-CM

## 2020-11-23 DIAGNOSIS — T424X2D Poisoning by benzodiazepines, intentional self-harm, subsequent encounter: Secondary | ICD-10-CM

## 2020-11-23 DIAGNOSIS — F329 Major depressive disorder, single episode, unspecified: Secondary | ICD-10-CM | POA: Insufficient documentation

## 2020-11-23 DIAGNOSIS — G4700 Insomnia, unspecified: Secondary | ICD-10-CM | POA: Diagnosis present

## 2020-11-23 DIAGNOSIS — Z87891 Personal history of nicotine dependence: Secondary | ICD-10-CM | POA: Diagnosis not present

## 2020-11-23 DIAGNOSIS — Z20822 Contact with and (suspected) exposure to covid-19: Secondary | ICD-10-CM | POA: Diagnosis present

## 2020-11-23 DIAGNOSIS — Z818 Family history of other mental and behavioral disorders: Secondary | ICD-10-CM

## 2020-11-23 LAB — LIPID PANEL
Cholesterol: 199 mg/dL (ref 0–200)
HDL: 31 mg/dL — ABNORMAL LOW (ref >40)
LDL Cholesterol: 117 mg/dL — ABNORMAL HIGH (ref 0–99)
Total CHOL/HDL Ratio: 6.4 RATIO
Triglycerides: 255 mg/dL — ABNORMAL HIGH (ref <150)
VLDL: 51 mg/dL — ABNORMAL HIGH (ref 0–40)

## 2020-11-23 LAB — CBC WITH DIFFERENTIAL/PLATELET
Abs Immature Granulocytes: 0.01 10*3/uL (ref 0.00–0.07)
Basophils Absolute: 0 10*3/uL (ref 0.0–0.1)
Basophils Relative: 0 %
Eosinophils Absolute: 0.2 10*3/uL (ref 0.0–0.5)
Eosinophils Relative: 4 %
HCT: 39.7 % (ref 39.0–52.0)
Hemoglobin: 13.3 g/dL (ref 13.0–17.0)
Immature Granulocytes: 0 %
Lymphocytes Relative: 37 %
Lymphs Abs: 1.9 10*3/uL (ref 0.7–4.0)
MCH: 31.7 pg (ref 26.0–34.0)
MCHC: 33.5 g/dL (ref 30.0–36.0)
MCV: 94.5 fL (ref 80.0–100.0)
Monocytes Absolute: 0.4 10*3/uL (ref 0.1–1.0)
Monocytes Relative: 7 %
Neutro Abs: 2.7 10*3/uL (ref 1.7–7.7)
Neutrophils Relative %: 52 %
Platelets: 228 10*3/uL (ref 150–400)
RBC: 4.2 MIL/uL — ABNORMAL LOW (ref 4.22–5.81)
RDW: 12.1 % (ref 11.5–15.5)
WBC: 5.2 10*3/uL (ref 4.0–10.5)
nRBC: 0 % (ref 0.0–0.2)

## 2020-11-23 LAB — COMPREHENSIVE METABOLIC PANEL
ALT: 30 U/L (ref 0–44)
AST: 21 U/L (ref 15–41)
Albumin: 3.7 g/dL (ref 3.5–5.0)
Alkaline Phosphatase: 49 U/L (ref 38–126)
Anion gap: 8 (ref 5–15)
BUN: 13 mg/dL (ref 6–20)
CO2: 27 mmol/L (ref 22–32)
Calcium: 9.3 mg/dL (ref 8.9–10.3)
Chloride: 106 mmol/L (ref 98–111)
Creatinine, Ser: 1.05 mg/dL (ref 0.61–1.24)
GFR, Estimated: >60 mL/min (ref >60)
Glucose, Bld: 103 mg/dL — ABNORMAL HIGH (ref 70–99)
Potassium: 4.2 mmol/L (ref 3.5–5.1)
Sodium: 141 mmol/L (ref 135–145)
Total Bilirubin: 0.4 mg/dL (ref 0.3–1.2)
Total Protein: 6 g/dL — ABNORMAL LOW (ref 6.5–8.1)

## 2020-11-23 LAB — TSH: TSH: 1.312 u[IU]/mL (ref 0.350–4.500)

## 2020-11-23 MED ORDER — LORAZEPAM 1 MG PO TABS
1.0000 mg | ORAL_TABLET | Freq: Every day | ORAL | Status: AC
Start: 2020-11-27 — End: 2020-11-27
  Administered 2020-11-27: 1 mg via ORAL
  Filled 2020-11-23: qty 1

## 2020-11-23 MED ORDER — THIAMINE HCL 100 MG PO TABS
100.0000 mg | ORAL_TABLET | Freq: Every day | ORAL | Status: DC
Start: 2020-11-24 — End: 2020-11-29
  Administered 2020-11-24 – 2020-11-29 (×6): 100 mg via ORAL
  Filled 2020-11-23 (×9): qty 1

## 2020-11-23 MED ORDER — LORAZEPAM 1 MG PO TABS
1.0000 mg | ORAL_TABLET | Freq: Three times a day (TID) | ORAL | Status: AC
  Administered 2020-11-25 (×3): 1 mg via ORAL
  Filled 2020-11-23 (×3): qty 1

## 2020-11-23 MED ORDER — ADULT MULTIVITAMIN W/MINERALS CH
1.0000 | ORAL_TABLET | Freq: Every day | ORAL | Status: DC
Start: 2020-11-23 — End: 2020-11-29
  Administered 2020-11-23 – 2020-11-29 (×7): 1 via ORAL
  Filled 2020-11-23 (×11): qty 1
  Filled 2020-11-23: qty 7

## 2020-11-23 MED ORDER — LORAZEPAM 1 MG PO TABS
1.0000 mg | ORAL_TABLET | Freq: Two times a day (BID) | ORAL | Status: AC
  Administered 2020-11-26 (×2): 1 mg via ORAL
  Filled 2020-11-23 (×2): qty 1

## 2020-11-23 MED ORDER — MAGNESIUM HYDROXIDE 400 MG/5ML PO SUSP: 30.0000 mL | Freq: Every day | ORAL | Status: DC | PRN

## 2020-11-23 MED ORDER — LOPERAMIDE HCL 2 MG PO CAPS: 2.0000 mg | ORAL_CAPSULE | ORAL | Status: AC | PRN

## 2020-11-23 MED ORDER — ONDANSETRON 4 MG PO TBDP: 4.0000 mg | ORAL_TABLET | Freq: Four times a day (QID) | ORAL | Status: AC | PRN

## 2020-11-23 MED ORDER — DIPHENHYDRAMINE HCL 25 MG PO CAPS: 25.0000 mg | ORAL_CAPSULE | Freq: Four times a day (QID) | ORAL | Status: DC | PRN

## 2020-11-23 MED ORDER — LORAZEPAM 1 MG PO TABS: 1.0000 mg | ORAL_TABLET | Freq: Four times a day (QID) | ORAL | Status: AC | PRN

## 2020-11-23 MED ORDER — NICOTINE 14 MG/24HR TD PT24
14.0000 mg | MEDICATED_PATCH | Freq: Every day | TRANSDERMAL | Status: DC
  Filled 2020-11-23 (×4): qty 1

## 2020-11-23 MED ORDER — ALUM & MAG HYDROXIDE-SIMETH 200-200-20 MG/5ML PO SUSP
30.0000 mL | ORAL | Status: DC | PRN
Start: 2020-11-23 — End: 2020-11-29
  Filled 2020-11-23: qty 30

## 2020-11-23 MED ORDER — TRAZODONE HCL 50 MG PO TABS
50.0000 mg | ORAL_TABLET | Freq: Every evening | ORAL | Status: DC | PRN
  Administered 2020-11-23: 50 mg via ORAL
  Filled 2020-11-23: qty 1

## 2020-11-23 MED ORDER — HYDROXYZINE HCL 25 MG PO TABS
25.0000 mg | ORAL_TABLET | Freq: Three times a day (TID) | ORAL | Status: DC | PRN
  Filled 2020-11-23: qty 10

## 2020-11-23 MED ORDER — LORAZEPAM 1 MG PO TABS
1.0000 mg | ORAL_TABLET | Freq: Four times a day (QID) | ORAL | Status: AC
  Administered 2020-11-23 – 2020-11-24 (×5): 1 mg via ORAL
  Filled 2020-11-23 (×5): qty 1
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
## 2020-11-23 NOTE — Tx Team (Signed)
Initial Treatment Plan 11/23/2020 3:46 PM [name] NWG:[id number]    PATIENT STRESSORS: Educational concerns   Financial difficulties   Medication change or noncompliance     PATIENT STRENGTHS: Ability for insight  Average or above average intelligence  Communication skills  Motivation for treatment/growth  Physical Health  Supportive family/friends    PATIENT IDENTIFIED PROBLEMS: si  anxiety  depression  Medication noncompliance                DISCHARGE CRITERIA:  Ability to meet basic life and health needs Improved stabilization in mood, thinking, and/or behavior Motivation to continue treatment in a less acute level of care Need for constant or close observation no longer present  PRELIMINARY DISCHARGE PLAN: Attend aftercare/continuing care group Outpatient therapy Return to previous living arrangement Return to previous work or school arrangements  PATIENT/FAMILY INVOLVEMENT: This treatment plan has been presented to and reviewed with the patient, Phillip [name].  The patient and family have been given the opportunity to ask questions and make suggestions.  Raylene Miyamoto, RN 11/23/2020, 3:46 PM

## 2020-11-23 NOTE — Progress Notes (Signed)
The focus of this group is to help patients review their daily goal of treatment and discuss progress on daily workbooks. Pt did not attend the evening group. 

## 2020-11-23 NOTE — Progress Notes (Signed)
DAR Note: Patient denies SI/HI/AVH, mood depressed and affect blunted, pt visible in the milieu earlier in shift participating in activities, but with limited interactions with his peers. Pt reports a good appetite, mainly stated that he was shy, and feeling anxious, and was medicated with his scheduled dose of Ativan 1mg . Pt also medicated with Trazodone 50mg  for insomnia. Q15 minute checks are being maintained for safety.   11/23/20 2240  Psych Admission Type (Psych Patients Only)  Admission Status Involuntary  Psychosocial Assessment  Patient Complaints Anxiety;Depression  Eye Contact Fair  Facial Expression Anxious;Sullen;Sad;Worried  Affect Anxious;Depressed;Sad;Sullen   Appearance/Hygiene Unremarkable  Behavior Characteristics Cooperative;Appropriate to situation  Mood Depressed;Anxious  Thought Process  Coherency Concrete thinking  Content Blaming self  Delusions None reported or observed  Perception WDL  Hallucination None reported or observed  Judgment Poor  Confusion None  Danger to Self  Current suicidal ideation? Denies  Danger to Others  Danger to Others None reported or observed

## 2020-11-24 ENCOUNTER — Encounter (HOSPITAL_COMMUNITY): Payer: Self-pay

## 2020-11-24 DIAGNOSIS — F332 Major depressive disorder, recurrent severe without psychotic features: Principal | ICD-10-CM

## 2020-11-24 DIAGNOSIS — F419 Anxiety disorder, unspecified: Secondary | ICD-10-CM | POA: Diagnosis present

## 2020-11-24 LAB — HEMOGLOBIN A1C
Hgb A1c MFr Bld: 5.8 % — ABNORMAL HIGH (ref 4.8–5.6)
Mean Plasma Glucose: 120 mg/dL

## 2020-11-24 MED ORDER — TRAZODONE HCL 50 MG PO TABS
50.0000 mg | ORAL_TABLET | Freq: Every evening | ORAL | Status: DC | PRN
  Administered 2020-11-24 – 2020-11-25 (×2): 50 mg via ORAL
  Filled 2020-11-24 (×2): qty 1
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
## 2020-11-24 NOTE — [facility] Group Notes (Signed)
Topic:   Due to the acuity and Covid-19 precautions, group was not held. Patient was provided therapeutic worksheets and asked to meet with CSW as needed. 

## 2020-11-24 NOTE — Plan of Care (Signed)
  Problem: Education: Goal: Ability to state activities that reduce stress will improve Outcome: Progressing   Problem: Coping: Goal: Ability to identify and develop effective coping behavior will improve Outcome: Progressing   Problem: Self-Concept: Goal: Ability to identify factors that promote anxiety will improve Outcome: Progressing   

## 2020-11-24 NOTE — [facility] Group Notes (Signed)
[facility] Group Notes:  (Nursing/MHT/Case Management/Adjunct)  Date:  11/24/2020  Time:  2:54 PM  Type of Therapy:  Group Therapy  Participation Level:  Did Not Attend  Patient did not attend group.  Phillip [name] 11/24/2020, 2:54 PM

## 2020-11-24 NOTE — Progress Notes (Signed)
Pt did not participate with group. 

## 2020-11-24 NOTE — Progress Notes (Signed)
Recreation Therapy Notes  Date: 8.31.22 Time: 0930 Location: 300 Hall Dayroom  Group Topic: Stress Management   Goal Area(s) Addresses:  Patient will actively participate in stress management techniques presented during session.  Patient will successfully identify benefit of practicing stress management post d/c.   Intervention: Relaxation exercise with ambient sound and script   Activity: Guided Imagery. LRT provided education, instruction, and demonstration on practice of visualization via guided imagery. Patient was asked to participate in the technique introduced during session. LRT debriefed including topics of mindfulness, stress management and specific scenarios each patient could use these techniques. Patients were given suggestions of ways to access scripts post d/c and encouraged to explore Youtube and other apps available on smartphones, tablets, and computers.  Education:  Stress Management, Discharge Planning.   Education Outcome: Acknowledges education  Clinical Observations/Feedback: Due to there being [name] COVID case on the unit group did not occur as scheduled.  LRT did give out packets dealing with symptoms and triggers of stress.  It also dealt with identifying things that are in your control and things that are not.     Phillip [name], LRT/CTRS         Phillip [name] 11/24/2020 11:34 AM 
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
## 2020-11-24 NOTE — Progress Notes (Signed)
Progress note  Pt found in bed; compliant with medication administration. Pt denies any physical complaints or pain. From report, pt didn't sleep well but pt feels like they did. Pt voices symptoms or anxiety but no other detox symptoms. Pt feels like this isn't related to withdrawal. Pt has been isolative to their room because of protocol. Pt is pleasant but still seems sad/sullen. Pt denies si/hi/ah/vh and verbally agrees to approach staff if these become apparent or before harming themselves/others while at [facility].  A: Pt provided support and encouragement. Pt given medication per protocol and standing orders. Q54m safety checks implemented and continued.  R: Pt safe on the unit. Will continue to monitor.

## 2020-11-24 NOTE — BH IP Treatment Plan (Signed)
Interdisciplinary Treatment and Diagnostic Plan Update  11/24/2020 Time of Session: 9:20am  Phillip [name] MRN: [id number]  Principal Diagnosis: <principal problem not specified>  Secondary Diagnoses: Active Problems:   MDD (major depressive disorder)   Current Medications:  Current Facility-Administered Medications  Medication Dose Route Frequency Provider Last Rate Last Admin   alum & mag hydroxide-simeth (MAALOX/MYLANTA) 200-200-20 MG/5ML suspension 30 mL  30 mL Oral Q4H PRN Novella Olive, NP       diphenhydrAMINE (BENADRYL) capsule 25 mg  25 mg Oral Q6H PRN Novella Olive, NP       hydrOXYzine (ATARAX/VISTARIL) tablet 25 mg  25 mg Oral TID PRN Novella Olive, NP       loperamide (IMODIUM) capsule 2-4 mg  2-4 mg Oral PRN Laveda Abbe, NP       LORazepam (ATIVAN) tablet 1 mg  1 mg Oral Q6H PRN Laveda Abbe, NP       LORazepam (ATIVAN) tablet 1 mg  1 mg Oral QID Laveda Abbe, NP   1 mg at 11/24/20 9024   Followed by   Melene Muller ON 11/25/2020] LORazepam (ATIVAN) tablet 1 mg  1 mg Oral TID Laveda Abbe, NP       Followed by   Melene Muller ON 11/26/2020] LORazepam (ATIVAN) tablet 1 mg  1 mg Oral BID Laveda Abbe, NP       Followed by   Melene Muller ON 11/27/2020] LORazepam (ATIVAN) tablet 1 mg  1 mg Oral Daily Laveda Abbe, NP       magnesium hydroxide (MILK OF MAGNESIA) suspension 30 mL  30 mL Oral Daily PRN Novella Olive, NP       multivitamin with minerals tablet 1 tablet  1 tablet Oral Daily Laveda Abbe, NP   1 tablet at 11/24/20 0747   nicotine (NICODERM CQ - dosed in mg/24 hours) patch 14 mg  14 mg Transdermal Q0600 Novella Olive, NP       ondansetron (ZOFRAN-ODT) disintegrating tablet 4 mg  4 mg Oral Q6H PRN Laveda Abbe, NP       thiamine tablet 100 mg  100 mg Oral Daily Laveda Abbe, NP   100 mg at 11/24/20 0747   traZODone (DESYREL) tablet 50 mg  50 mg Oral QHS PRN Novella Olive, NP   50 mg at 11/23/20 2122    PTA Medications: Medications Prior to Admission  Medication Sig Dispense Refill Last Dose   clonazePAM (KLONOPIN) 1 MG tablet Take one tablet po tid 90 tablet 3    doxepin (SINEQUAN) 25 MG capsule Take 1 capsule (25mg  total) by mouth at bedtime 30 capsule 1    lisdexamfetamine (VYVANSE) 40 MG capsule 1 qam 30 capsule 0    lithium carbonate (LITHOBID) 300 MG CR tablet Take 2 tablets (600 mg total) by mouth every evening. (Patient not taking: Reported on 11/23/2020) 60 tablet 5 Not Taking    Patient Stressors: Educational concerns   Financial difficulties   Medication change or noncompliance    Patient Strengths: Ability for insight  Average or above average 11/25/2020 for treatment/growth  Physical Health  Supportive family/friends   Treatment Modalities: Medication Management, Group therapy, Case management,  1 to 1 session with clinician, Psychoeducation, Recreational therapy.   Physician Treatment Plan for Primary Diagnosis: <principal problem not specified> Long Term Goal(s):     Short Term Goals:    Medication Management: Evaluate patient's response, side effects,  and tolerance of medication regimen.  Therapeutic Interventions: 1 to 1 sessions, Unit Group sessions and Medication administration.  Evaluation of Outcomes: Progressing  Physician Treatment Plan for Secondary Diagnosis: Active Problems:   MDD (major depressive disorder)  Long Term Goal(s):     Short Term Goals:       Medication Management: Evaluate patient's response, side effects, and tolerance of medication regimen.  Therapeutic Interventions: 1 to 1 sessions, Unit Group sessions and Medication administration.  Evaluation of Outcomes: Progressing   RN Treatment Plan for Primary Diagnosis: <principal problem not specified> Long Term Goal(s): Knowledge of disease and therapeutic regimen to maintain health will improve  Short Term Goals: Ability to remain free  from injury will improve, Ability to participate in decision making will improve, Ability to verbalize feelings will improve, Ability to disclose and discuss suicidal ideas, and Ability to identify and develop effective coping behaviors will improve  Medication Management: RN will administer medications as ordered by provider, will assess and evaluate patient's response and provide education to patient for prescribed medication. RN will report any adverse and/or side effects to prescribing provider.  Therapeutic Interventions: 1 on 1 counseling sessions, Psychoeducation, Medication administration, Evaluate responses to treatment, Monitor vital signs and CBGs as ordered, Perform/monitor CIWA, COWS, AIMS and Fall Risk screenings as ordered, Perform wound care treatments as ordered.  Evaluation of Outcomes: Progressing   LCSW Treatment Plan for Primary Diagnosis: <principal problem not specified> Long Term Goal(s): Safe transition to appropriate next level of care at discharge, Engage patient in therapeutic group addressing interpersonal concerns.  Short Term Goals: Engage patient in aftercare planning with referrals and resources, Increase social support, Facilitate acceptance of mental health diagnosis and concerns, Identify triggers associated with mental health/substance abuse issues, and Increase skills for wellness and recovery  Therapeutic Interventions: Assess for all discharge needs, 1 to 1 time with Social worker, Explore available resources and support systems, Assess for adequacy in community support network, Educate family and significant other(s) on suicide prevention, Complete Psychosocial Assessment, Interpersonal group therapy.  Evaluation of Outcomes: Progressing   Progress in Treatment: Attending groups: No. Participating in groups: No. Taking medication as prescribed: Yes. Toleration medication: Yes. Family/Significant other contact made: Yes, individual(s) contacted:  If  consents are provided  Patient understands diagnosis: Yes. and No. Discussing patient identified problems/goals with staff: Yes. Medical problems stabilized or resolved: Yes. Denies suicidal/homicidal ideation: Yes. Issues/concerns per patient self-inventory: No.   New problem(s) identified: No, Describe:  None   New Short Term/Long Term Goal(s): medication stabilization, elimination of SI thoughts, development of comprehensive mental wellness plan.   Patient Goals: "To be less anxious"   Discharge Plan or Barriers: Patient recently admitted. CSW will continue to follow and assess for appropriate referrals and possible discharge planning.   Reason for Continuation of Hospitalization: Depression Medical Issues Medication stabilization Suicidal ideation  Estimated Length of Stay: 3 to 5 days    Scribe for Treatment Team: Aram Beecham, Theresia Majors 11/24/2020 10:53 AM

## 2020-11-24 NOTE — [facility] Group Notes (Signed)
[facility] Group Notes:  (Nursing/MHT/Case Management/Adjunct)  Date:  11/24/2020  Time:  2:55 PM  Type of Therapy:  Psychoeducational Skills  Participation Level:  Active  Participation Quality:  Appropriate  Affect:  Appropriate  Cognitive:  Appropriate  Insight:  Appropriate  Engagement in Group:  Engaged  Modes of Intervention:  Discussion  Summary of Progress/Problems:  Patient attended group and stayed appropriate. Patient was asked where they see themself in 10 years and what are some good qualities they have. Patient stated that in 10 years, he hopes to graduate from college and utilities it. Patient stated that he does not know any of his good qualities.   [name] 11/24/2020, 2:55 PM

## 2020-11-25 ENCOUNTER — Other Ambulatory Visit
Admission: RE | Admit: 2020-11-25 | Discharge: 2020-11-25 | Disposition: A | Discharge status: Home / Self Care | Payer: 59 | Source: Ambulatory Visit | Attending: Family Medicine | Admitting: Family Medicine

## 2020-11-25 DIAGNOSIS — F332 Major depressive disorder, recurrent severe without psychotic features: Secondary | ICD-10-CM | POA: Diagnosis not present

## 2020-11-25 LAB — TRICYCLICS SCREEN, URINE: TCA Scrn: POSITIVE — AB

## 2020-11-25 MED ORDER — LITHIUM CARBONATE ER 300 MG PO TBCR
300.0000 mg | EXTENDED_RELEASE_TABLET | Freq: Two times a day (BID) | ORAL | Status: DC
  Administered 2020-11-25 – 2020-11-29 (×8): 300 mg via ORAL
  Filled 2020-11-25 (×9): qty 1
  Filled 2020-11-25: qty 14
  Filled 2020-11-25 (×2): qty 1
  Filled 2020-11-25: qty 14

## 2020-11-25 NOTE — Progress Notes (Signed)
  Nursing Note: 0700-1900  D:   Goal for today: "To start taking Lithium again and to have a better understanding of how to cope with stress." Pt presents with depressed mood and blunted/constricted affect.  Reports that he slept well last night, appetite is good, and that he is tolerating prescribed medication but feels "a little out of it."  Rates that anxiety is 2/10 and depression 0/10 this am.  "I'm going to be so far behind in my work, but I know I need to be here." A:  Pt. encouraged to verbalize needs and concerns, active listening and support provided.  Continued Q 15 minute safety checks. R:  Pt. is pleasant and cooperative.  Denies A/V hallucinations and is able to verbally contract for safety. Shared that he needs to attend classes and pray today.   [place] NOVEL CORONAVIRUS (COVID-19) DAILY CHECK-OFF SYMPTOMS - answer yes or no to each - every day NO YES  Have you had a fever in the past 24 hours?  Fever (Temp > 37.80C / 100F) X   Have you had any of these symptoms in the past 24 hours? New Cough  Sore Throat   Shortness of Breath  Difficulty Breathing  Unexplained Body Aches   X   Have you had any one of these symptoms in the past 24 hours not related to allergies?   Runny Nose  Nasal Congestion  Sneezing   X   If you have had runny nose, nasal congestion, sneezing in the past 24 hours, has it worsened?  X   EXPOSURES - check yes or no X   Have you traveled outside the state in the past 14 days?  X   Have you been in contact with someone with a confirmed diagnosis of COVID-19 or PUI in the past 14 days without wearing appropriate PPE?  X   Have you been living in the same home as a person with confirmed diagnosis of COVID-19 or a PUI (household contact)?    X   Have you been diagnosed with COVID-19?    X              What to do next: Answered NO to all: Answered YES to anything:   Proceed with unit schedule Follow the [facility] Inpatient Flowsheet.

## 2020-11-25 NOTE — [facility] Group Notes (Signed)
Patient did not attend the Psycho-Ed group. 

## 2020-11-25 NOTE — Progress Notes (Signed)
Delance was up and visible on the unit.  He attended evening AA group.  He denied SI/HI or AVH.  He rated his day as ok.  He denied any pain or discomfort and appeared to be in no physical distress.  He took his bedtime medication without difficulty.  5pm Ativan dose was moved to 8pm.  8pm dose was given and rescheduled his bedtime dose to 1am.  Melbourne Abts PA was notified and he was in agreement with the plan.  He was alseep and did not receive the 1am dose.  CIWA's were not completed as he was sleeping.  Q 15 minute breaks maintained for safety.   11/24/20 2130  Psych Admission Type (Psych Patients Only)  Admission Status Involuntary  Psychosocial Assessment  Patient Complaints Anxiety;Depression  Eye Contact Fair  Facial Expression Sad;Worried  Affect Anxious;Depressed;Sad  Speech Logical/coherent  Interaction Assertive  Motor Activity Slow  Appearance/Hygiene In scrubs  Behavior Characteristics Cooperative;Appropriate to situation  Mood Depressed;Anxious;Sad  Thought Administrator, sports thinking  Content Blaming self  Delusions None reported or observed  Perception WDL  Hallucination None reported or observed  Judgment Poor  Confusion None  Danger to Self  Current suicidal ideation? Denies  Danger to Others  Danger to Others None reported or observed

## 2020-11-25 NOTE — Progress Notes (Signed)
   11/25/20 2110  Psych Admission Type (Psych Patients Only)  Admission Status Involuntary  Psychosocial Assessment  Patient Complaints None  Eye Contact Fair  Facial Expression Other (Comment) (appropriate)  Affect Appropriate to circumstance  Speech Logical/coherent  Interaction Assertive  Motor Activity Other (Comment) (WDL)  Appearance/Hygiene Unremarkable  Behavior Characteristics Cooperative;Appropriate to situation  Mood Pleasant  Thought Process  Coherency WDL  Content WDL  Delusions None reported or observed  Perception WDL  Hallucination None reported or observed  Judgment Poor  Confusion None  Danger to Self  Current suicidal ideation? Denies  Danger to Others  Danger to Others None reported or observed  Danger to Others Abnormal  Harmful Behavior to others No threats or harm toward other people
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
## 2020-11-25 NOTE — B[facility] Group Notes (Signed)
[facility] Group Notes:  (Nursing/MHT/Case Management/Adjunct)  Date:  11/25/2020  Time:  8:30 PM  Type of Therapy:  Group Therapy  Participation Level:  Active  Participation Quality:  Attentive  Affect:  Appropriate  Cognitive:  Appropriate  Insight:  Appropriate  Engagement in Group:  Developing/Improving  Modes of Intervention:  Discussion  Summary of Progress/Problems:  Lorita Officer 11/25/2020, 8:30 PM

## 2020-11-25 NOTE — Progress Notes (Addendum)
D:  Patient denied SI and HI, contracts for safety.  Denied A/V hallucinations.  Safety maintained with 15 minute checks. A:  Medications administered per MD orders.  Emotional support and encouragement given patient. R:  Safety maintained with 15 minute checks.  

## 2020-11-25 NOTE — [facility] Group Notes (Signed)
Patient did not attend the relaxation group. 

## 2020-11-25 NOTE — Progress Notes (Signed)
[facility] MD Progress Note  11/25/2020 3:23 PM Phillip [name]  MRN:  [id number]  Reason for admission:  Phillip [name] is a 50 year old male with past psychiatric diagnoses of major depressive disorder versus bipolar disorder, anxiety, borderline personality disorder, PTSD and ADHD who presented to [facility] ED via EMS on 11/22/2020 after an intentional overdose on prescription medications in a suicide attempt.  Patient reportedly called family member and made statements about suicide.  Family member called 35 for a welfare check.  EMS personnel found empty bottles for clonazepam and doxepin as well as a suicide note at patient's bedside.  Objective: Medical record reviewed.  Patient's case discussed in detail with nursing staff and members of the treatment team.  I met with and evaluated the patient on the unit today for follow-up.  Patient looks a bit better today.  His affect is slightly brighter.  He remains superficial in his engagement in conversation with me and minimizes symptoms that he experienced prior to his overdose as well as the potential seriousness of his suicide attempt.  Patient describes his mood as "awesome" today.  He states that he has drawn strength from praying and reading the Bible.  He is looking forward to returning to [place] after discharge and states he plans to put less pressure on himself to achieve good grades.  He states that he feels better on his current medications than he did on the medications he was taking prior to admission.  We discussed that he is currently on a lorazepam taper to prevent benzodiazepine withdrawal and that lorazepam will be discontinued prior to discharge.  Patient denies passive wish for death, SI, AI, HI, PI, AH or VH.  He denies any symptoms of benzodiazepine withdrawal.  Patient is perseverative on asking why his urine drug screen did not show the presence of benzodiazepines or amphetamines since he took clonazepam and Vyvanse in his  overdose.  I attempted to reinforce with patient that the primary concern is that he tried to kill himself regardless of the pills he ingested or the results of the urine tox screen.  We discussed the importance of patient developing strategies to reduce the likelihood of a possible future suicide attempt.  Patient stated willingness to participate in psychotherapy and psychiatric medication management after discharge.  He denies ataxia, slurred speech, dizziness, trouble breathing, chest pain, palpitations, pain, nausea, vomiting, constipation, diarrhea, tremor or other physical issues.  Patient stated willingness to consider initiation of a medication for his mood symptoms.  We discussed a variety of different medications to treat mood symptoms.  Patient stated preference to resume taking lithium carbonate which he has taken in the past.  Risks, benefits, side effects, alternatives to treatment with lithium were discussed with patient in detail including but not limited to risks of toxicity, death, cardiac arrhythmia, thyroid problems, adverse effect on kidneys, etc. Patient stated understanding of information discussed and gave informed consent to restart lithium carbonate.  Staff document that patient slept 6.75 hours last night.  Vital signs today include temperature of 97.5, O2 sat of 98% on room air, BP of 142/85 and pulse of 57.  EKG performed today showed normal sinus rhythm, ventricular rate 63 and QT/QTc of 394/403.  Urine tricyclic screen was sent yesterday (given that patient states he took doxepin in overdose) but results are not yet available.  CIWA scores: 0 at 6 PM yesterday; 0 at 6 AM this morning.  Principal Problem: Major depressive disorder, recurrent, severe without psychotic features ([place])  Diagnosis: Principal Problem:   Major depressive disorder, recurrent, severe without psychotic features (HCC) Active Problems:   Borderline personality disorder (HCC)   Anxiety disorder,  unspecified  Total Time spent with patient:  25 minutes  Past Psychiatric History: See admission H&P  Past Medical History:  Past Medical History:  Diagnosis Date   Anxiety    Bipolar disorder (HCC)    Depression    PTSD (post-traumatic stress disorder)     Past Surgical History:  Procedure Laterality Date   cholesterol     WISDOM TOOTH EXTRACTION     Family History:  Family History  Problem Relation Age of Onset   Cancer Mother    Cancer Father    Depression Father    Family Psychiatric  History: See admission H&P Social History:  Social History   Substance and Sexual Activity  Alcohol Use No     Social History   Substance and Sexual Activity  Drug Use No   Types: Marijuana   Comment: denies    Social History   Socioeconomic History   Marital status: Divorced    Spouse name: Not on file   Number of children: Not on file   Years of education: Not on file   Highest education level: Not on file  Occupational History   Not on file  Tobacco Use   Smoking status: Former    Packs/day: 0.60    Years: 19.00    Pack years: 11.40    Types: Cigarettes   Smokeless tobacco: Former   Tobacco comments:    Has cut back to 4 a day and trying to stop  Vaping Use   Vaping Use: Never used  Substance and Sexual Activity   Alcohol use: No   Drug use: No    Types: Marijuana    Comment: denies   Sexual activity: Yes    Partners: Female    Birth control/protection: None  Other Topics Concern   Not on file  Social History Narrative   Not on file   Social Determinants of Health   Financial Resource Strain: Not on file  Food Insecurity: Not on file  Transportation Needs: Not on file  Physical Activity: Not on file  Stress: Not on file  Social Connections: Not on file   Additional Social History:                         Sleep: Good  Appetite:  Good  Current Medications: Current Facility-Administered Medications  Medication Dose Route Frequency  Provider Last Rate Last Admin   alum & mag hydroxide-simeth (MAALOX/MYLANTA) 200-200-20 MG/5ML suspension 30 mL  30 mL Oral Q4H PRN Novella Olive, NP       diphenhydrAMINE (BENADRYL) capsule 25 mg  25 mg Oral Q6H PRN Novella Olive, NP       hydrOXYzine (ATARAX/VISTARIL) tablet 25 mg  25 mg Oral TID PRN Novella Olive, NP       loperamide (IMODIUM) capsule 2-4 mg  2-4 mg Oral PRN Laveda Abbe, NP       LORazepam (ATIVAN) tablet 1 mg  1 mg Oral Q6H PRN Laveda Abbe, NP       LORazepam (ATIVAN) tablet 1 mg  1 mg Oral TID Laveda Abbe, NP   1 mg at 11/25/20 1250   Followed by   Melene Muller ON 11/26/2020] LORazepam (ATIVAN) tablet 1 mg  1 mg Oral BID Laveda Abbe, NP  Followed by   Derrill Memo ON 11/27/2020] LORazepam (ATIVAN) tablet 1 mg  1 mg Oral Daily Ethelene Hal, NP       magnesium hydroxide (MILK OF MAGNESIA) suspension 30 mL  30 mL Oral Daily PRN Chalmers Guest, NP       multivitamin with minerals tablet 1 tablet  1 tablet Oral Daily Ethelene Hal, NP   1 tablet at 11/25/20 0810   ondansetron (ZOFRAN-ODT) disintegrating tablet 4 mg  4 mg Oral Q6H PRN Ethelene Hal, NP       thiamine tablet 100 mg  100 mg Oral Daily Ethelene Hal, NP   100 mg at 11/25/20 0810   traZODone (DESYREL) tablet 50 mg  50 mg Oral QHS PRN,MR X 1 Arthor Captain, MD   50 mg at 11/24/20 2149    Lab Results:  Results for orders placed or performed during the hospital encounter of 11/23/20 (from the past 48 hour(s))  Hemoglobin A1c     Status: Abnormal   Collection Time: 11/23/20  7:09 PM  Result Value Ref Range   Hgb A1c MFr Bld 5.8 (H) 4.8 - 5.6 %    Comment: (NOTE)         Prediabetes: 5.7 - 6.4         Diabetes: >6.4         Glycemic control for adults with diabetes: <7.0    Mean Plasma Glucose 120 mg/dL    Comment: (NOTE) Performed At: [facility] [place], [place] [id number] Rush Farmer MD OA:[id number]   TSH      Status: None   Collection Time: 11/23/20  7:09 PM  Result Value Ref Range   TSH 1.312 0.350 - 4.500 uIU/mL    Comment: Performed by a 3rd Generation assay with a functional sensitivity of <=0.01 uIU/mL. Performed at [facility], [place] [address], [place], [place] [address]   Lipid panel     Status: Abnormal   Collection Time: 11/23/20  7:09 PM  Result Value Ref Range   Cholesterol 199 0 - 200 mg/dL   Triglycerides 255 (H) <150 mg/dL   HDL 31 (L) >40 mg/dL   Total CHOL/HDL Ratio 6.4 RATIO   VLDL 51 (H) 0 - 40 mg/dL   LDL Cholesterol 117 (H) 0 - 99 mg/dL    Comment:        Total Cholesterol/HDL:CHD Risk Coronary Heart Disease Risk Table                     Men   Women  1/2 Average Risk   3.4   3.3  Average Risk       5.0   4.4  2 X Average Risk   9.6   7.1  3 X Average Risk  23.4   11.0        Use the calculated Patient Ratio above and the CHD Risk Table to determine the patient's CHD Risk.        ATP III CLASSIFICATION (LDL):  <100     mg/dL   Optimal  100-129  mg/dL   Near or Above                    Optimal  130-159  mg/dL   Borderline  160-189  mg/dL   High  >190     mg/dL   Very High Performed at [facility] [address] [place], [place]  [address]   Comprehensive metabolic panel     Status: Abnormal   Collection Time: 11/23/20  7:09 PM  Result Value Ref Range   Sodium 141 135 - 145 mmol/L   Potassium 4.2 3.5 - 5.1 mmol/L   Chloride 106 98 - 111 mmol/L   CO2 27 22 - 32 mmol/L   Glucose, Bld 103 (H) 70 - 99 mg/dL    Comment: Glucose reference range applies only to samples taken after fasting for at least 8 hours.   BUN 13 6 - 20 mg/dL   Creatinine, Ser 1.05 0.61 - 1.24 mg/dL   Calcium 9.3 8.9 - 10.3 mg/dL   Total Protein 6.0 (L) 6.5 - 8.1 g/dL   Albumin 3.7 3.5 - 5.0 g/dL   AST 21 15 - 41 U/L   ALT 30 0 - 44 U/L   Alkaline Phosphatase 49 38 - 126 U/L   Total Bilirubin 0.4 0.3 - 1.2 mg/dL   GFR, Estimated >60 >60 mL/min    Comment:  (NOTE) Calculated using the CKD-EPI Creatinine Equation (2021)    Anion gap 8 5 - 15    Comment: Performed at [facility] [address] [place], [place] [address]  CBC with Differential/Platelet     Status: Abnormal   Collection Time: 11/23/20  7:09 PM  Result Value Ref Range   WBC 5.2 4.0 - 10.5 K/uL   RBC 4.20 (L) 4.22 - 5.81 MIL/uL   Hemoglobin 13.3 13.0 - 17.0 g/dL   HCT 39.7 39.0 - 52.0 %   MCV 94.5 80.0 - 100.0 fL   MCH 31.7 26.0 - 34.0 pg   MCHC 33.5 30.0 - 36.0 g/dL   RDW 12.1 11.5 - 15.5 %   Platelets 228 150 - 400 K/uL   nRBC 0.0 0.0 - 0.2 %   Neutrophils Relative % 52 %   Neutro Abs 2.7 1.7 - 7.7 K/uL   Lymphocytes Relative 37 %   Lymphs Abs 1.9 0.7 - 4.0 K/uL   Monocytes Relative 7 %   Monocytes Absolute 0.4 0.1 - 1.0 K/uL   Eosinophils Relative 4 %   Eosinophils Absolute 0.2 0.0 - 0.5 K/uL   Basophils Relative 0 %   Basophils Absolute 0.0 0.0 - 0.1 K/uL   Immature Granulocytes 0 %   Abs Immature Granulocytes 0.01 0.00 - 0.07 K/uL    Comment: Performed at [facility], [place] [address], [place], [place] [address]    Blood Alcohol level:  Lab Results  Component Value Date   [facility] <5 06/03/2016   ETH <5 22/48/2500    Metabolic Disorder Labs: Lab Results  Component Value Date   HGBA1C 5.8 (H) 11/23/2020   MPG 120 11/23/2020   MPG 103 06/04/2016   Lab Results  Component Value Date   PROLACTIN 19.5 (H) 06/04/2016   Lab Results  Component Value Date   CHOL 199 11/23/2020   TRIG 255 (H) 11/23/2020   HDL 31 (L) 11/23/2020   CHOLHDL 6.4 11/23/2020   VLDL 51 (H) 11/23/2020   LDLCALC 117 (H) 11/23/2020   LDLCALC 207 (H) 06/04/2016    Physical Findings: AIMS: Facial and Oral Movements Muscles of Facial Expression: None, normal Lips and Perioral Area: None, normal Jaw: None, normal Tongue: None, normal,Extremity Movements Upper (arms, wrists, hands, fingers): None, normal Lower (legs, knees, ankles, toes): None, normal,  Trunk Movements Neck, shoulders, hips: None, normal, Overall Severity Severity of abnormal movements (highest score from questions above): None, normal Incapacitation  due to abnormal movements: None, normal Patient's awareness of abnormal movements (rate only patient's report): No Awareness, Dental Status Current problems with teeth and/or dentures?: No Does patient usually wear dentures?: No  CIWA:  CIWA-Ar Total: 0 COWS:     Musculoskeletal: Strength & Muscle Tone: within normal limits Gait & Station: normal Patient leans: N/A  Psychiatric Specialty Exam:  Presentation  General Appearance: Casual; Fairly Groomed  Eye Contact:Good  Speech:Clear and Coherent; Normal Rate  Speech Volume:Normal  Handedness: No data recorded  Mood and Affect  Mood:Anxious  Affect:Appropriate; Congruent   Thought Process  Thought Processes:Coherent  Descriptions of Associations:Intact  Orientation:Full (Time, Place and Person)  Thought Content:Logical  History of Schizophrenia/Schizoaffective disorder:Yes Phillip [name] reported that he was diagnosed with schizoaffective disorder in the past 5 years.)  Duration of Psychotic Symptoms:Greater than six months  Hallucinations:Hallucinations: None  Ideas of Reference:None  Suicidal Thoughts:Suicidal Thoughts: No  Homicidal Thoughts:Homicidal Thoughts: No   Sensorium  Memory:Immediate Good; Recent Good  Judgment:Fair  Insight:Shallow   Executive Functions  Concentration:Good  Attention Span:Good  [place] of Knowledge:Good  Language:Good   Psychomotor Activity  Psychomotor Activity:Psychomotor Activity: Normal   Assets  Assets:Communication Skills; Desire for Improvement; Housing; Resilience; Social Support; Vocational/Educational   Sleep  Sleep:Sleep: Good Number of Hours of Sleep: 6.75    Physical Exam: Physical Exam Vitals and nursing note reviewed.  Constitutional:      General: He is not in  acute distress.    Appearance: Normal appearance. He is not diaphoretic.  HENT:     Head: Normocephalic and atraumatic.  Cardiovascular:     Rate and Rhythm: Bradycardia present.  Pulmonary:     Effort: Pulmonary effort is normal.  Neurological:     General: No focal deficit present.     Mental Status: He is alert and oriented to person, place, and time.     Motor: No tremor.   Review of Systems  Constitutional:  Negative for chills, diaphoresis, fever and malaise/fatigue.  HENT:  Negative for sore throat.   Respiratory:  Negative for cough and shortness of breath.   Cardiovascular:  Negative for chest pain and palpitations.  Gastrointestinal:  Negative for constipation, diarrhea, nausea and vomiting.  Musculoskeletal:  Negative for myalgias.  Skin:  Negative for rash.  Neurological:  Negative for dizziness, tremors, seizures and headaches.  Psychiatric/Behavioral:  Negative for depression, hallucinations and suicidal ideas. The patient does not have insomnia.   Blood pressure (!) 142/85, pulse (!) 57, temperature (!) 97.5 F (36.4 C), temperature source Oral, resp. rate 16, height $RemoveBe'5\' 10"'qbcMcYmna$  (1.778 m), weight 86.6 kg, SpO2 98 %. Body mass index is 27.41 kg/m.   Treatment Plan Summary: Patient is a 50 year old male with history of depression, anxiety, borderline personality disorder and ADHD admitted following an impulsive suicide attempt by overdose on prescription medications in the context of multiple psychosocial stressors.  Current presentation and history are most consistent with primary diagnoses of major depressive disorder, recurrent with comorbid borderline personality disorder and unspecified anxiety disorder.  Patient reported taking prescribed clonazepam, Vyvanse and doxepin in overdose.  UDS was negative ED (including negative for benzodiazepines or amphetamines).  No testing was performed in the ED for the presence of tricyclic's.  Urine specimen was sent for tricyclic screen  yesterday and results are still pending.    Daily contact with patient to assess and evaluate symptoms and progress in treatment and Medication management  Benzodiazepine use -Patient was prescribed clonazepam 1 mg 3 times daily  as an outpatient which was not continued after admission due to reported recent overdose on clonazepam -Continue CIWA protocol with standing dose lorazepam taper, comfort meds and PRN lorazepam for CIWA scores >10 to cover for any possible benzodiazepine withdrawal.  Anxiety -Continue hydroxyzine 25 mg 3 times daily as needed for anxiety  Insomnia -Continue trazodone 50 mg nightly as needed for insomnia  Major depressive disorder, recurrent versus bipolar disorder -Start lithium carbonate 300 mg Q12H for mood symptoms.  EKG performed today showed normal sinus rhythm with no QT prolongation.  Labs performed on 11/23/2020 include creatinine of 1.05, electrolytes were WNL and TSH was WNL.  Discharge planning in progress.  Patient will need referral to outpatient therapist at discharge.  He would benefit from participation in [place] program if he has access to [place] program.     Arthor Captain, MD 11/25/2020, 3:23 PM

## 2020-11-26 DIAGNOSIS — F332 Major depressive disorder, recurrent severe without psychotic features: Secondary | ICD-10-CM | POA: Diagnosis not present

## 2020-11-26 NOTE — Progress Notes (Signed)
Recreation Therapy Notes  Date:  9.2.22 Time: 0930 Location: [address] Dayroom  Group Topic: Stress Management  Goal Area(s) Addresses:  Patient will identify positive stress management techniques. Patient will identify benefits of using stress management post d/c.  Intervention: Stress Management  Activity :  LRT played a meditation that focused on setting healthy boundaries even when it upsets the people around you.  Education:  Stress Management, Discharge Planning.   Education Outcome: Acknowledges Education  Clinical Observations/Feedback: Pt did not attend group session.    Caroll Rancher, LRT/CTRS         Caroll Rancher A 11/26/2020 11:26 AM

## 2020-11-26 NOTE — [facility] Group Notes (Signed)
Type of Therapy and Topic:  Group Therapy:  Self-Esteem   Participation Level:  Active  Description of Group: This group addressed positive self-esteem. Patients were given a worksheet with a blank shield. Patients were asked what a shield is and when it is used. Patients were asked to list, draw, or write protective factors in the their lives on their shields. Patients discussed the words, ideas and drawings that they put on their shield. Patients were encouraged to have a daily reflection of positive characteristics/ protective factors.  Therapeutic Goals Patient will verbalize two of their positive qualities Patient will demonstrate insight but naming social supports in their lives Patient will verbalize their feelings when voicing positive self affirmations and when voicing positive affirmations of others Patients will discuss the potential positive impact on their wellness/recovery of focusing on positive traits of self and others.  Summary of Patient Progress: The Pt stated that one coping skill he uses is prayer.  He did not share a positive or protective factor.  The Pt accepted the worksheet and participated in the discussion with their peers.

## 2020-11-26 NOTE — [facility] Group Notes (Signed)
Relaxation-did not attend

## 2020-11-26 NOTE — Progress Notes (Signed)
[facility] MD Progress Note  11/26/2020 1:49 PM Phillip [name]  MRN:  [id number]  Subjective: Phillip [name] reports, "I'm feeling very good today. I feel very alert & I'm glad that I'm alive. Prior to all these happening, I was living alone in a cabin in a [place] acres [place] land with no one to talk to, trying to go to school & remain active in my new found church. Then, my phone messed up that led to my computer crashing. As a result, I was unable to get my school assignments completed, became overwhelmed. The next thing I remembered was waking up in a hospital after I took an overdose of 80-90 mg of klonopin,  bunch of Doxepin tablets & 40 mg of Vyvanse. I thought no one would miss me, but I was wrong because I have people that love me unconditionally.  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 Finally, I will not have to be alone any more. I have also reached out to the student advocate at the [facility], who has reached out to my professors about me being in the hospital & the reasons they did not receive my completed assignments from me. I have no symptoms of depression or anxiety today. 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  Daily notes: Phillip [name] is seen in his room, chart reviewed. The chart findings discussed with the treatment team. He presents alert & oriented x 4. He is visible on the unit, attending group sessions. He presents with an improved affect, but emotional/tearful during this evaluation. He says he feels guilty & regretful because of his suicide attempt by overdose on multiple medications, not knowing that he was loved so much by his friends. He also wondered why upon the amount of klonopin (80-90 mg), 40 mg of Vyvanse & a bunch of Doxepin tablets, nothing showed up in his UDS/toxicology reports. He says he was feeling overwhelmed by loneliness & the inability to  complete his school assignment after his phone messed-up leading to his computer crashing. As a result, he attempted to end his life. He says he realized now that there are other ways to handle sudden issues than just trying to take himself out of this world. 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 He also says he has reached out to the student advocate at his school who has reached out to his professors to explain what has happened with him that led to him not being able to complete his school assignments. He says everything is working out for the better for him. He says he is taking & tolerating his treatment regimen. Denies any side effects. He says this his self-induced near death experience has opened up a new door for him. He says he is now going to pursue a degree to become a philosopher or a professor for biblical studies. He says with his new found faith & supportive friends, he would never want to take any more benzodiazepine in his life. He denies any SIHI, AVH, delusional thoughts or paranoia. He does not appear to be responding to any internal stimuli. He is in agreement to continue current plan of care as already in progress. Will obtain CMP & Lithium level on Monday, 11-29-20.   Reason for  admission:  Phillip [name] is a 50 year old male with past psychiatric diagnoses of major depressive disorder versus bipolar disorder, anxiety, borderline personality disorder, PTSD and ADHD who presented to [facility] ED via EMS on 11/22/2020 after an intentional overdose on prescription medications in a suicide attempt.  Patient reportedly called family member and made statements about suicide.  Family member called 67 for a welfare check.  EMS personnel found empty bottles for clonazepam and doxepin as well as a suicide note at patient's bedside.  (Per previous notes):Objective: Medical record reviewed.  Patient's case  discussed in detail with nursing staff and members of the treatment team.  I met with and evaluated the patient on the unit today for follow-up. His affect is slightly brighter. He remains superficial in his engagement in conversation with me and minimizes symptoms that he experienced prior to his overdose as well as the potential seriousness of his suicide attempt.  Patient describes his mood as "awesome" today.  He states that he has drawn strength from praying and reading the Bible.  He is looking forward to returning to [employer] after discharge and states he plans to put less pressure on himself to achieve good grades.  He states that he feels better on his current medications than he did on the medications he was taking prior to admission.  We discussed that he is currently on a lorazepam taper to prevent benzodiazepine withdrawal and that lorazepam will be discontinued prior to discharge.  Patient denies passive wish for death, SI, AI, HI, PI, AH or VH.  He denies any symptoms of benzodiazepine withdrawal.  Patient is perseverative on asking why his urine drug screen did not show the presence of benzodiazepines or amphetamines since he took clonazepam and Vyvanse in his overdose.  I attempted to reinforce with patient that the primary concern is that he tried to kill himself regardless of the pills he ingested or the results of the urine tox screen.  We discussed the importance of patient developing strategies to reduce the likelihood of a possible future suicide attempt.  Patient stated willingness to participate in psychotherapy and psychiatric medication management after discharge.  He denies ataxia, slurred speech, dizziness, trouble breathing, chest pain, palpitations, pain, nausea, vomiting, constipation, diarrhea, tremor or other physical issues.  Patient stated willingness to consider initiation of a medication for his mood symptoms.  We discussed a variety of different medications to treat mood  symptoms.  Patient stated preference to resume taking lithium carbonate which he has taken in the past.  Risks, benefits, side effects, alternatives to treatment with lithium were discussed with patient in detail including but not limited to risks of toxicity, death, cardiac arrhythmia, thyroid problems, adverse effect on kidneys, etc. Patient stated understanding of information discussed and gave informed consent to restart lithium carbonate.  Staff document that patient slept 6.75 hours last night.  Vital signs today include temperature of 97.5, O2 sat of 98% on room air, BP of 142/85 and pulse of 57.  EKG performed today showed normal sinus rhythm, ventricular rate 63 and QT/QTc of 394/403.  Urine tricyclic screen was sent yesterday (given that patient states he took doxepin in overdose) but results are not yet available.  CIWA scores: 0 at 6 PM yesterday; 0 at 6 AM this morning.  Principal Problem: Major depressive disorder, recurrent, severe without psychotic features ([place]) Diagnosis: Principal Problem:   Major depressive disorder, recurrent, severe without psychotic features ([place]) Active Problems:   Borderline personality disorder ([place])  Anxiety disorder, unspecified  Total Time spent with patient:  25 minutes  Past Psychiatric History: See admission H&P  Past Medical History:  Past Medical History:  Diagnosis Date   Anxiety    Bipolar disorder ([place])    Depression    PTSD (post-traumatic stress disorder)     Past Surgical History:  Procedure Laterality Date   cholesterol     WISDOM TOOTH EXTRACTION     Family History:  Family History  Problem Relation Age of Onset   Cancer Mother    Cancer Father    Depression Father    Family Psychiatric  History: See admission H&P  Social History:  Social History   Substance and Sexual Activity  Alcohol Use No     Social History   Substance and Sexual Activity  Drug Use No   Types: Marijuana   Comment: denies    Social History    Socioeconomic History   Marital status: Divorced    Spouse name: Not on file   Number of children: Not on file   Years of education: Not on file   Highest education level: Not on file  Occupational History   Not on file  Tobacco Use   Smoking status: Former    Packs/day: 0.60    Years: 19.00    Pack years: 11.40    Types: Cigarettes   Smokeless tobacco: Former   Tobacco comments:    Has cut back to 4 a day and trying to stop  Vaping Use   Vaping Use: Never used  Substance and Sexual Activity   Alcohol use: No   Drug use: No    Types: Marijuana    Comment: denies   Sexual activity: Yes    Partners: Female    Birth control/protection: None  Other Topics Concern   Not on file  Social History Narrative   Not on file   Social Determinants of Health   Financial Resource Strain: Not on file  Food Insecurity: Not on file  Transportation Needs: Not on file  Physical Activity: Not on file  Stress: Not on file  Social Connections: Not on file   Additional Social History:   Sleep: Good  Appetite:  Good  Current Medications: Current Facility-Administered Medications  Medication Dose Route Frequency Provider Last Rate Last Admin   alum & mag hydroxide-simeth (MAALOX/MYLANTA) 200-200-20 MG/5ML suspension 30 mL  30 mL Oral Q4H PRN Chalmers Guest, NP       diphenhydrAMINE (BENADRYL) capsule 25 mg  25 mg Oral Q6H PRN Chalmers Guest, NP       hydrOXYzine (ATARAX/VISTARIL) tablet 25 mg  25 mg Oral TID PRN Chalmers Guest, NP       lithium carbonate (LITHOBID) CR tablet 300 mg  300 mg Oral Q12H Arthor Captain, MD   300 mg at 11/26/20 2376   loperamide (IMODIUM) capsule 2-4 mg  2-4 mg Oral PRN Ethelene Hal, NP       LORazepam (ATIVAN) tablet 1 mg  1 mg Oral Q6H PRN Ethelene Hal, NP       LORazepam (ATIVAN) tablet 1 mg  1 mg Oral BID Ethelene Hal, NP   1 mg at 11/26/20 2831   Followed by   Derrill Memo ON 11/27/2020] LORazepam (ATIVAN) tablet 1 mg  1 mg Oral  Daily Ethelene Hal, NP       magnesium hydroxide (MILK OF MAGNESIA) suspension 30 mL  30 mL Oral Daily PRN Pecolia Ades  R, NP       multivitamin with minerals tablet 1 tablet  1 tablet Oral Daily Ethelene Hal, NP   1 tablet at 11/26/20 0832   ondansetron (ZOFRAN-ODT) disintegrating tablet 4 mg  4 mg Oral Q6H PRN Ethelene Hal, NP       thiamine tablet 100 mg  100 mg Oral Daily Ethelene Hal, NP   100 mg at 11/26/20 2122   traZODone (DESYREL) tablet 50 mg  50 mg Oral QHS PRN,MR X 1 Arthor Captain, MD   50 mg at 11/25/20 2110   Lab Results:  Results for orders placed or performed during the hospital encounter of 11/23/20 (from the past 48 hour(s))  Tricyclics screen, urine     Status: Abnormal   Collection Time: 11/24/20  2:27 PM  Result Value Ref Range   TCA Scrn POSITIVE (A) NONE DETECTED    Comment:        LOWEST DETECTABLE LIMITS FOR URINE DRUG SCREEN Drug Class       Cutoff (ng/mL) Tricyclics         [address] [facility], [address] [place], [place] [address]    Blood Alcohol level:  Lab Results  Component Value Date   [facility] <5 06/03/2016   ETH <5 48/88/9169   Metabolic Disorder Labs: Lab Results  Component Value Date   HGBA1C 5.8 (H) 11/23/2020   MPG 120 11/23/2020   MPG 103 06/04/2016   Lab Results  Component Value Date   PROLACTIN 19.5 (H) 06/04/2016   Lab Results  Component Value Date   CHOL 199 11/23/2020   TRIG 255 (H) 11/23/2020   HDL 31 (L) 11/23/2020   CHOLHDL 6.4 11/23/2020   VLDL 51 (H) 11/23/2020   LDLCALC 117 (H) 11/23/2020   LDLCALC 207 (H) 06/04/2016   Physical Findings: AIMS: Facial and Oral Movements Muscles of Facial Expression: None, normal Lips and Perioral Area: None, normal Jaw: None, normal Tongue: None, normal,Extremity Movements Upper (arms, wrists, hands, fingers): None, normal Lower (legs, knees, ankles, toes): None, normal, Trunk Movements Neck, shoulders, hips: None,  normal, Overall Severity Severity of abnormal movements (highest score from questions above): None, normal Incapacitation due to abnormal movements: None, normal Patient's awareness of abnormal movements (rate only patient's report): No Awareness, Dental Status Current problems with teeth and/or dentures?: No Does patient usually wear dentures?: No  CIWA:  CIWA-Ar Total: 1 COWS:     Musculoskeletal: Strength & Muscle Tone: within normal limits Gait & Station: normal Patient leans: N/A  Psychiatric Specialty Exam:  Presentation  General Appearance: Casual; Fairly Groomed  Eye Contact:Good  Speech:Clear and Coherent; Normal Rate  Speech Volume:Normal  Handedness: No data recorded  Mood and Affect  Mood:Anxious  Affect:Appropriate; Congruent  Thought Process  Thought Processes:Coherent  Descriptions of Associations:Intact  Orientation:Full (Time, Place and Person)  Thought Content:Logical  History of Schizophrenia/Schizoaffective disorder:Yes Phillip [name] reported that he was diagnosed with schizoaffective disorder in the past 5 years.)  Duration of Psychotic Symptoms:Greater than six months  Hallucinations:Hallucinations: None  Ideas of Reference:None  Suicidal Thoughts:Suicidal Thoughts: No  Homicidal Thoughts:Homicidal Thoughts: No  Sensorium  Memory:Immediate Good; Recent Good  Judgment:Fair  Insight:Shallow  Executive Functions  Concentration:Good  Attention Span:Good  Dale of Knowledge:Good  Language:Good  Psychomotor Activity  Psychomotor Activity:Psychomotor Activity: Normal  Assets  Assets:Communication Skills; Desire for Improvement; Housing; Resilience; Social Support; Vocational/Educational  Sleep  Sleep:Sleep: Good Number of Hours of Sleep: 6.75  Physical Exam:  Physical Exam Vitals and nursing note reviewed.  Constitutional:      General: He is not in acute distress.    Appearance: Normal appearance. He is not  diaphoretic.  HENT:     Head: Normocephalic and atraumatic.  Cardiovascular:     Rate and Rhythm: Bradycardia present.  Pulmonary:     Effort: Pulmonary effort is normal.  Neurological:     General: No focal deficit present.     Mental Status: He is alert and oriented to person, place, and time.     Motor: No tremor.   Review of Systems  Constitutional:  Negative for chills, diaphoresis, fever and malaise/fatigue.  HENT:  Negative for sore throat.   Respiratory:  Negative for cough and shortness of breath.   Cardiovascular:  Negative for chest pain and palpitations.  Gastrointestinal:  Negative for constipation, diarrhea, nausea and vomiting.  Musculoskeletal:  Negative for myalgias.  Skin:  Negative for rash.  Neurological:  Negative for dizziness, tremors, seizures and headaches.  Psychiatric/Behavioral:  Negative for depression, hallucinations and suicidal ideas. The patient does not have insomnia.   Blood pressure (!) 127/91, pulse 62, temperature (!) 97.4 F (36.3 C), temperature source Oral, resp. rate 16, height $RemoveBe'5\' 10"'MoeWiWLDV$  (1.778 m), weight 86.6 kg, SpO2 99 %. Body mass index is 27.41 kg/m.  Treatment Plan Summary: Patient is a 50 year old male with history of depression, anxiety, borderline personality disorder and ADHD admitted following an impulsive suicide attempt by overdose on prescription medications in the context of multiple psychosocial stressors.  Current presentation and history are most consistent with primary diagnoses of major depressive disorder, recurrent with comorbid borderline personality disorder and unspecified anxiety disorder.  Patient reported taking prescribed clonazepam, Vyvanse and doxepin in overdose.  UDS was negative ED (including negative for benzodiazepines or amphetamines).  No testing was performed in the ED for the presence of tricyclic's.  Urine specimen was sent for tricyclic screen yesterday and results are still pending.    Daily contact with  patient to assess and evaluate symptoms and progress in treatment and Medication management.  Will continue today 11/26/2020 plan as below except where it is noted.   Benzodiazepine use -Patient was prescribed clonazepam 1 mg 3 times daily as an outpatient which was not continued after admission due to reported recent overdose on clonazepam -Continue CIWA protocol with standing dose lorazepam taper, comfort meds and PRN lorazepam for CIWA scores >10 to cover for any possible benzodiazepine withdrawal.  Anxiety -Continue hydroxyzine 25 mg 3 times daily as needed for anxiety  Insomnia -Continue trazodone 50 mg nightly as needed for insomnia  Major depressive disorder, recurrent versus bipolar disorder -Continue lithium carbonate 300 mg Q12H for mood symptoms.  EKG performed today showed normal sinus rhythm with no QT prolongation.  Labs performed on 11/23/2020 include creatinine of 1.05, electrolytes were WNL and TSH was WNL.   Will obtain CMP, Lithium levels on Monday 11-29-20 morning: (result pending).  Discharge planning in progress.  Patient will need referral to outpatient therapist at discharge.  He would benefit from participation in [facility] program if he has access to [facility], NP, PMHNP, FNP-BC 11/26/2020, 1:49 PM Patient ID: [name], male   DOB: 1970-08-31, 50 y.o.   MRN: [id number]

## 2020-11-26 NOTE — [facility] Group Notes (Signed)
Patient and contributed to group

## 2020-11-26 NOTE — [facility] Suicide Risk Assessment (Signed)
[facility] INPATIENT:  Family/Significant Other Suicide Prevention Education  Suicide Prevention Education:  Education Completed; Erling Conte [phone] (Friend) has been identified by the patient as the family member/significant other with whom the patient will be residing, and identified as the person(s) who will aid the patient in the event of a mental health crisis (suicidal ideations/suicide attempt).  With written consent from the patient, the family member/significant other has been provided the following suicide prevention education, prior to the and/or following the discharge of the patient.  The suicide prevention education provided includes the following: Suicide risk factors Suicide prevention and interventions National Suicide Hotline telephone number [facility] assessment telephone number [facility] Emergency Assistance [address] [facility] and/or Residential Mobile Crisis Unit telephone number  Request made of family/significant other to: Remove weapons (e.g., guns, rifles, knives), all items previously/currently identified as safety concern.   Remove drugs/medications (over-the-counter, prescriptions, illicit drugs), all items previously/currently identified as a safety concern.  The family member/significant other verbalizes understanding of the suicide prevention education information provided.  The family member/significant other agrees to remove the items of safety concern listed above.  CSW spoke with Mr. Auman who states that his friend joined their church on Sunday morning and was depressed on Sunday night.  He states that his friend is often depressed and cannot get out of bed but also states that when his friend is no depressed he talks very fast and no one else is able to talk with him.  Mr. Cristy Friedlander states that his friend does share with him when he feels depressed and what his thoughts and feelings are.  Mr. Cristy Friedlander states that he will continue to be a support  once his friend is discharged from the hospital.  Mr. Cristy Friedlander states that there are no firearms or weapons in the home.  CSW completed SPE with Mr. Smiley Houseman Ermal Haberer 11/26/2020, 4:01 PM

## 2020-11-26 NOTE — Progress Notes (Addendum)
   11/26/20 2230  Psych Admission Type (Psych Patients Only)  Admission Status Involuntary  Psychosocial Assessment  Patient Complaints None  Eye Contact Brief  Facial Expression Other (Comment) (appropriate)  Affect Appropriate to circumstance  Speech Logical/coherent  Interaction Assertive  Motor Activity Slow  Appearance/Hygiene Unremarkable  Behavior Characteristics Cooperative;Appropriate to situation  Mood Pleasant  Thought Process  Coherency WDL  Content WDL  Delusions None reported or observed  Perception WDL  Judgment Poor  Confusion None  Danger to Self  Current suicidal ideation? Denies  Danger to Others  Danger to Others None reported or observed   Pt seen at med window. Denies SI, HI, AVH and pain. "I've had a good day." Rates anxiety 2/10 and denies depression. CIWA=1. Did c/o slight heartburn from dinner. Pleasant and takes meds as prescribed.

## 2020-11-27 DIAGNOSIS — F332 Major depressive disorder, recurrent severe without psychotic features: Secondary | ICD-10-CM | POA: Diagnosis not present

## 2020-11-27 MED ORDER — NICOTINE POLACRILEX 2 MG MT GUM
2.0000 mg | CHEWING_GUM | OROMUCOSAL | Status: DC | PRN
  Administered 2020-11-27 – 2020-11-29 (×8): 2 mg via ORAL
  Filled 2020-11-27 (×3): qty 1

## 2020-11-27 MED ORDER — ACETAMINOPHEN 325 MG PO TABS
650.0000 mg | ORAL_TABLET | Freq: Four times a day (QID) | ORAL | Status: DC | PRN
  Administered 2020-11-27: 650 mg via ORAL
  Filled 2020-11-27: qty 2

## 2020-11-27 MED ORDER — QUETIAPINE FUMARATE 50 MG PO TABS
50.0000 mg | ORAL_TABLET | Freq: Every evening | ORAL | Status: DC | PRN
  Administered 2020-11-27: 50 mg via ORAL
  Filled 2020-11-27: qty 1

## 2020-11-27 NOTE — Progress Notes (Signed)
[facility] MD Progress Note  11/27/2020 12:18 PM Phillip [name]  MRN:  [id number]  Subjective: Phillip [name] reports, "I'm feeling very good today. I feel very alert & I'm glad that I'm alive. Prior to all these happening, I was living alone in a cabin in a [place] acres [place] land with no one to talk to. I am currently trying to go to school & remain active in my new found church. Then, my phone messed up that led to my computer crashing. As a result, I was unable to get my school assignments completed, became overwhelmed. The next thing I remembered was waking up in a hospital after I took an overdose of 80-90 mg of klonopin,  bunch of Doxepin tablets & 40 mg of Vyvanse. I thought no one would miss me, but I was wrong because I have people that love me unconditionally.  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 Finally, I will not have to be alone any more. I have also reached out to the student advocate at the [employer], who has reached out to my professors about me being in the hospital & the reasons they did not receive my completed assignments from me. I have no symptoms of depression or anxiety today. 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  Daily notes: Phillip [name] is seen in his room, chart reviewed. The chart findings discussed with the treatment team. He presents alert & oriented x 4. He is visible on the unit. He presents with an improved affect, he is smiling and pleasant today. He stated he was in the [employer] and feels like much of his "perfectionism" stems from that. He feels like a failure if he is unable to complete what he started. He stated he has reached out to his school and they know he Is in the hospital. We discussed that he may be taking too many courses too fast if it so overwhelming he feels he has to commit suicide. He stated he is considering cutting back and not  trying to finish everything so fast. He is very happy he has such a strong faith and supportive church family. He is going to be moving away form the cabin on 5 acres and moving into his Sunday school teacher's home. He stated "he cares for his elderly mother and his wife passed away , he extended his home to me."  He says everything is working out for the better for him. He says he is taking & tolerating his treatment regimen. He denies any medication side effects. He is glad to be off benzodiazepines and also feels he probably will not go back on Vyvanse as he was not sleeping well when taking it. His roommate has complained about his snoring. We discussed the possibility of a sleep study after discharge to make sure he does not have sleep apnea. He says with his new found faith & supportive friends, he would never want to take any more benzodiazepine in his life. He denies any SI/HI, AVH, delusional thoughts or paranoia. He does not appear to be responding to any internal stimuli. He is in agreement to continue current plan of care as already in progress. Will obtain CMP & Lithium level on Monday, 11-29-20.   Reason for admission:  Phillip [name] is a 50 year old male with past psychiatric diagnoses of major depressive disorder  versus bipolar disorder, anxiety, borderline personality disorder, PTSD and ADHD who presented to [facility] ED via EMS on 11/22/2020 after an intentional overdose on prescription medications in a suicide attempt.  Patient reportedly called family member and made statements about suicide.  Family member called 68 for a welfare check.  EMS personnel found empty bottles for clonazepam and doxepin as well as a suicide note at patient's bedside.  (Per previous notes):Objective: Medical record reviewed.  Patient's case discussed in detail with nursing staff and members of the treatment team.  I met with and evaluated the patient on the unit today for follow-up. His affect is slightly  brighter. He remains superficial in his engagement in conversation with me and minimizes symptoms that he experienced prior to his overdose as well as the potential seriousness of his suicide attempt.  Patient describes his mood as "awesome" today.  He states that he has drawn strength from praying and reading the Bible.  He is looking forward to returning to [place] after discharge and states he plans to put less pressure on himself to achieve good grades.  He states that he feels better on his current medications than he did on the medications he was taking prior to admission.  We discussed that he is currently on a lorazepam taper to prevent benzodiazepine withdrawal and that lorazepam will be discontinued prior to discharge.  Patient denies passive wish for death, SI, AI, HI, PI, AH or VH.  He denies any symptoms of benzodiazepine withdrawal.  Patient is perseverative on asking why his urine drug screen did not show the presence of benzodiazepines or amphetamines since he took clonazepam and Vyvanse in his overdose.  I attempted to reinforce with patient that the primary concern is that he tried to kill himself regardless of the pills he ingested or the results of the urine tox screen.  We discussed the importance of patient developing strategies to reduce the likelihood of a possible future suicide attempt.  Patient stated willingness to participate in psychotherapy and psychiatric medication management after discharge.  He denies ataxia, slurred speech, dizziness, trouble breathing, chest pain, palpitations, pain, nausea, vomiting, constipation, diarrhea, tremor or other physical issues.  Patient stated willingness to consider initiation of a medication for his mood symptoms.  We discussed a variety of different medications to treat mood symptoms.  Patient stated preference to resume taking lithium carbonate which he has taken in the past.  Risks, benefits, side effects, alternatives to treatment with  lithium were discussed with patient in detail including but not limited to risks of toxicity, death, cardiac arrhythmia, thyroid problems, adverse effect on kidneys, etc. Patient stated understanding of information discussed and gave informed consent to restart lithium carbonate.  Staff document that patient slept 6.75 hours last night.  Vital signs today include temperature of 97.6, O2 sat of 100% on room air, BP of 121/93 and pulse of 82.  EKG performed showed normal sinus rhythm, ventricular rate 63 and QT/QTc of 394/403.  Urine tricyclic screen was sent yesterday (given that patient states he took doxepin in overdose) but results are not yet available.  CIWA scores: not scored yet today, patient denies withdrawal symptoms. Patient is able to contract for safety on the unit.   Principal Problem: Major depressive disorder, recurrent, severe without psychotic features ([place]) Diagnosis: Principal Problem:   Major depressive disorder, recurrent, severe without psychotic features ([place]) Active Problems:   Borderline personality disorder ([place])   Anxiety disorder, unspecified  Total Time spent with patient:  15 minutes  Past Psychiatric History: See admission H&P  Past Medical History:  Past Medical History:  Diagnosis Date   Anxiety    Bipolar disorder ([place])    Depression    PTSD (post-traumatic stress disorder)     Past Surgical History:  Procedure Laterality Date   cholesterol     WISDOM TOOTH EXTRACTION     Family History:  Family History  Problem Relation Age of Onset   Cancer Mother    Cancer Father    Depression Father    Family Psychiatric  History: See admission H&P  Social History:  Social History   Substance and Sexual Activity  Alcohol Use No     Social History   Substance and Sexual Activity  Drug Use No   Types: Marijuana   Comment: denies    Social History   Socioeconomic History   Marital status: Divorced    Spouse name: Not on file   Number of children:  Not on file   Years of education: Not on file   Highest education level: Not on file  Occupational History   Not on file  Tobacco Use   Smoking status: Former    Packs/day: 0.60    Years: 19.00    Pack years: 11.40    Types: Cigarettes   Smokeless tobacco: Former   Tobacco comments:    Has cut back to 4 a day and trying to stop  Vaping Use   Vaping Use: Never used  Substance and Sexual Activity   Alcohol use: No   Drug use: No    Types: Marijuana    Comment: denies   Sexual activity: Yes    Partners: Female    Birth control/protection: None  Other Topics Concern   Not on file  Social History Narrative   Not on file   Social Determinants of Health   Financial Resource Strain: Not on file  Food Insecurity: Not on file  Transportation Needs: Not on file  Physical Activity: Not on file  Stress: Not on file  Social Connections: Not on file   Additional Social History:   Sleep: Good  Appetite:  Good  Current Medications: Current Facility-Administered Medications  Medication Dose Route Frequency Provider Last Rate Last Admin   alum & mag hydroxide-simeth (MAALOX/MYLANTA) 200-200-20 MG/5ML suspension 30 mL  30 mL Oral Q4H PRN Chalmers Guest, NP       diphenhydrAMINE (BENADRYL) capsule 25 mg  25 mg Oral Q6H PRN Chalmers Guest, NP       hydrOXYzine (ATARAX/VISTARIL) tablet 25 mg  25 mg Oral TID PRN Chalmers Guest, NP       lithium carbonate (LITHOBID) CR tablet 300 mg  300 mg Oral Q12H Arthor Captain, MD   300 mg at 11/27/20 0802   magnesium hydroxide (MILK OF MAGNESIA) suspension 30 mL  30 mL Oral Daily PRN Chalmers Guest, NP       multivitamin with minerals tablet 1 tablet  1 tablet Oral Daily Ethelene Hal, NP   1 tablet at 11/27/20 0741   thiamine tablet 100 mg  100 mg Oral Daily Ethelene Hal, NP   100 mg at 11/27/20 0742   traZODone (DESYREL) tablet 50 mg  50 mg Oral QHS PRN,MR X 1 Arthor Captain, MD   50 mg at 11/25/20 2110   Lab Results:  No  results found for this or any previous visit (from the past 5 hour(s)).  Blood Alcohol level:  Lab Results  Component Value Date   ETH <5 06/03/2016   ETH <5 17/00/1749   Metabolic Disorder Labs: Lab Results  Component Value Date   HGBA1C 5.8 (H) 11/23/2020   MPG 120 11/23/2020   MPG 103 06/04/2016   Lab Results  Component Value Date   PROLACTIN 19.5 (H) 06/04/2016   Lab Results  Component Value Date   CHOL 199 11/23/2020   TRIG 255 (H) 11/23/2020   HDL 31 (L) 11/23/2020   CHOLHDL 6.4 11/23/2020   VLDL 51 (H) 11/23/2020   LDLCALC 117 (H) 11/23/2020   LDLCALC 207 (H) 06/04/2016   Physical Findings: AIMS: Facial and Oral Movements Muscles of Facial Expression: None, normal Lips and Perioral Area: None, normal Jaw: None, normal Tongue: None, normal,Extremity Movements Upper (arms, wrists, hands, fingers): None, normal Lower (legs, knees, ankles, toes): None, normal, Trunk Movements Neck, shoulders, hips: None, normal, Overall Severity Severity of abnormal movements (highest score from questions above): None, normal Incapacitation due to abnormal movements: None, normal Patient's awareness of abnormal movements (rate only patient's report): No Awareness, Dental Status Current problems with teeth and/or dentures?: No Does patient usually wear dentures?: No  CIWA:  CIWA-Ar Total: 1 COWS:     Musculoskeletal: Strength & Muscle Tone: within normal limits Gait & Station: normal Patient leans: N/A  Psychiatric Specialty Exam:  Presentation  General Appearance: Casual; Fairly Groomed  Eye Contact:Good  Speech:Clear and Coherent; Normal Rate  Speech Volume:Normal  Handedness: No data recorded  Mood and Affect  Mood:Anxious  Affect:Appropriate; Congruent  Thought Process  Thought Processes:Coherent  Descriptions of Associations:Intact  Orientation:Full (Time, Place and Person)  Thought Content:Logical  History of Schizophrenia/Schizoaffective  disorder:Yes Phillip [name] reported that he was diagnosed with schizoaffective disorder in the past 5 years.)  Duration of Psychotic Symptoms:Greater than six months  Hallucinations:No data recorded  Ideas of Reference:None  Suicidal Thoughts:No data recorded  Homicidal Thoughts:No data recorded  Sensorium  Memory:Immediate Good; Recent Good  Judgment:Fair  Insight:Shallow  Executive Functions  Concentration:Good  Attention Span:Good  [place] of Knowledge:Good  Language:Good  Psychomotor Activity  Psychomotor Activity:No data recorded  Assets  Assets:Communication Skills; Desire for Improvement; Housing; Resilience; Social Support; Vocational/Educational  Sleep  Sleep:No data recorded  Physical Exam: Physical Exam Vitals and nursing note reviewed.  Constitutional:      General: He is not in acute distress.    Appearance: Normal appearance. He is not diaphoretic.  HENT:     Head: Normocephalic and atraumatic.  Cardiovascular:     Rate and Rhythm: Bradycardia present.  Pulmonary:     Effort: Pulmonary effort is normal.  Neurological:     General: No focal deficit present.     Mental Status: He is alert and oriented to person, place, and time.     Motor: No tremor.   Review of Systems  Constitutional:  Negative for chills, diaphoresis, fever and malaise/fatigue.  HENT:  Negative for sore throat.   Respiratory:  Negative for cough and shortness of breath.   Cardiovascular:  Negative for chest pain and palpitations.  Gastrointestinal:  Negative for constipation, diarrhea, nausea and vomiting.  Musculoskeletal:  Negative for myalgias.  Skin:  Negative for rash.  Neurological:  Negative for dizziness, tremors, seizures and headaches.  Psychiatric/Behavioral:  Negative for depression, hallucinations and suicidal ideas. The patient does not have insomnia.   Blood pressure (!) 121/93, pulse 82, temperature 97.6 F (36.4 C), temperature source Oral, resp.  rate 16, height $RemoveBe'5\' 10"'iPUorEgAH$  (1.778 m), weight  86.6 kg, SpO2 100 %. Body mass index is 27.41 kg/m.  Treatment Plan Summary: Patient is a 50 year old male with history of depression, anxiety, borderline personality disorder and ADHD admitted following an impulsive suicide attempt by overdose on prescription medications in the context of multiple psychosocial stressors.  Current presentation and history are most consistent with primary diagnoses of major depressive disorder, recurrent with comorbid borderline personality disorder and unspecified anxiety disorder.  Patient reported taking prescribed clonazepam, Vyvanse and doxepin in overdose.  UDS was negative ED (including negative for benzodiazepines or amphetamines).  No testing was performed in the ED for the presence of tricyclic's.  Urine specimen was sent for tricyclic screen yesterday and results are still pending.    Daily contact with patient to assess and evaluate symptoms and progress in treatment and Medication management.  Will continue today 11/27/2020 plan as below except where it is noted. No medication changes today.   Benzodiazepine use -Patient was prescribed clonazepam 1 mg 3 times daily as an outpatient which was not continued after admission due to reported recent overdose on clonazepam -Continue CIWA protocol with standing dose lorazepam taper, comfort meds and PRN lorazepam for CIWA scores >10 to cover for any possible benzodiazepine withdrawal.  Anxiety -Continue hydroxyzine 25 mg 3 times daily as needed for anxiety  Insomnia -Continue trazodone 50 mg nightly as needed for insomnia  Major depressive disorder, recurrent versus bipolar disorder -Continue lithium carbonate 300 mg Q12H for mood symptoms.  EKG performed today showed normal sinus rhythm with no QT prolongation.  Labs performed on 11/23/2020 include creatinine of 1.05, electrolytes were WNL and TSH was WNL.   Will obtain CMP, Lithium levels on Monday 11-29-20 morning:  (result pending). Will obtain repeat COVID PCR Monday as well.   Continue every 15 minute safety checks Discharge planning in progress.  Patient will need referral to outpatient therapist at discharge.  He would benefit from participation in [facility] program if he has access to [place], NP 11/27/2020, 3:52 PM

## 2020-11-27 NOTE — Plan of Care (Signed)
  Problem: Self-Concept: Goal: Level of anxiety will decrease Outcome: Progressing Goal: Ability to modify response to factors that promote anxiety will improve Outcome: Progressing   Problem: Education: Goal: Knowledge of [facility] Education information/materials will improve Outcome: Progressing   

## 2020-11-27 NOTE — Progress Notes (Signed)
Progress note    11/27/20 0742  Psych Admission Type (Psych Patients Only)  Admission Status Involuntary  Psychosocial Assessment  Patient Complaints None  Eye Contact Fair  Facial Expression Animated;Anxious  Affect Anxious;Appropriate to circumstance  Speech Logical/coherent  Interaction Assertive  Motor Activity Slow  Appearance/Hygiene Unremarkable  Behavior Characteristics Cooperative;Appropriate to situation;Anxious  Mood Anxious;Pleasant  Thought Process  Coherency WDL  Content WDL  Delusions None reported or observed  Perception WDL  Hallucination None reported or observed  Judgment WDL  Confusion None  Danger to Self  Current suicidal ideation? Denies  Danger to Others  Danger to Others None reported or observed

## 2020-11-27 NOTE — Progress Notes (Signed)
Patient is looking forward to discharging hopefully by Monday. Support given and safety maintained with 15 min checks.    11/27/20 1955  Psych Admission Type (Psych Patients Only)  Admission Status Involuntary  Psychosocial Assessment  Patient Complaints None  Eye Contact Fair  Facial Expression Anxious  Affect Anxious;Appropriate to circumstance  Speech Logical/coherent  Interaction Assertive  Motor Activity Slow  Appearance/Hygiene Unremarkable  Behavior Characteristics Appropriate to situation  Mood Pleasant  Thought Process  Coherency WDL  Content WDL  Delusions None reported or observed  Perception WDL  Hallucination None reported or observed  Judgment WDL  Confusion None  Danger to Self  Current suicidal ideation? Denies  Danger to Others  Danger to Others None reported or observed  Danger to Others Abnormal  Harmful Behavior to others No threats or harm toward other people

## 2020-11-27 NOTE — Group Note (Signed)
Clinical Social Work Note  No group could be held today due to a COVID-19 outbreak on the unit.  An educational packet about anger management was provided to the patient to work on individually.  Ambrose Mantle, LCSW 11/27/2020, 8:54 AM

## 2020-11-28 DIAGNOSIS — F332 Major depressive disorder, recurrent severe without psychotic features: Secondary | ICD-10-CM | POA: Diagnosis not present

## 2020-11-28 MED ORDER — TRAZODONE HCL 50 MG PO TABS
50.0000 mg | ORAL_TABLET | Freq: Every evening | ORAL | Status: DC | PRN
  Administered 2020-11-28: 50 mg via ORAL
  Filled 2020-11-28: qty 1
  Filled 2020-11-28: qty 7

## 2020-11-28 NOTE — Progress Notes (Signed)
[facility] Progress Note  11/28/2020 10:13 AM Phillip [name]  MRN:  [id number] Subjective:  "I feel really good today. I was hoping to be discharged today."   Objective: Phillip [name] is a 50 year old male with self-reported past psychiatric diagnoses of major depressive disorder versus bipolar disorder, severe anxiety, ADHD who [name]/2020 after an intentional overdose on prescription medications in a suicide attempt.  (Chart review indicates additional prior diagnoses of borderline personality disorder, PTSD and panic disorder with agoraphobia.)  Patient reportedly spoke to a family member on the telephone and made statements regarding suicide.  After the patient hung up, the family member made a call to 911 requesting a welfare check.  EMS arrived to find patient sleeping in his bed with 2 empty pill bottles nearby and a suicide note that stated "Goodbye I am sorry to you all."   UDS in the Phillip [name] was negative.  Patient was deemed medically stable for inpatient psychiatric admission and [name] for crisis stabilization and medication management.   Daily Note: Patient was seen and evaluated, chart reviewed and case discussed with the treatment team. Phillip CanalesSteve is in his room, reading his Bible. He is calm, cooperative and pleasant on approach. He stated he feels so much better since he is back on his Lithium and completely off the Klonopin. He stated he does not want to take benzodiazepines ever again. He stated he requested Seroquel yesterday because he was having some racing thoughts when he was trying to sleep. He reported he slept well last night, record reflects he slept 5.75 hours. He reported a good appetite. However, today he stated he did not want to take any Seroquel or trazodone and equates the racing thoughts to all the positive things happening in his life, We agreed to leave the Trazodone on his MAR in case he  has trouble sleeping tonight. He has no complaint of side effects from the Lithium. He stated he never learned as a child how to be happy or accept love and good things. He said as a result he has always had difficulty relating to others and lost hope with the world. He stated his mother was not in his life, his Dad worked as Copywriter, advertisinglineman for [employer] and was away 11 months of the year. He was raised by a Romaniananny and attended private schools. He has always felt isolated and alone. He stated he has had multiple suicide attempts and for the past 7-8 years he has attempted suicide at least 12 times. He stated Nov 1st last year was the last time he had a suicidal thought and on Thanksgiving day he was saved. He finds great hope in his faith and is enjoying his church family. 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 He stated Phillip [name] offered him a place in his home and wants him to be a part of his family. He is going to take him up on this offer and feels good about the prospect of having a family. We discussed, at length, that accepting joy and love may not come easily to him in the beginning and he was cautioned to work closely with his counselor to learn coping skills to help when he starts feeling as if he is not worthy. He denies SI/HI/AVH, paranoia and delusions. He does not appear to be  responding to internal stimuli. Patient is able to contract for safety on the unit. Reminded patient that all patient will have a repeat COVID test tomorrow.    Principal Problem: Major depressive disorder, recurrent, severe without psychotic features (HCC) Diagnosis: Principal Problem:   Major depressive disorder, recurrent, severe without psychotic features (HCC) Active Problems:   Borderline personality disorder (HCC)   Anxiety disorder, unspecified  Total Time spent with patient: 15 minutes  Past Psychiatric History: Patient reports prior diagnoses of  depression, severe anxiety, ADHD and bipolar 1.  Chart review indicates additional prior diagnoses of borderline personality disorder, PTSD and panic disorder with agoraphobia.  Patient is not able to provide me with a clear history of manic or hypomanic symptoms.  Patient states that he has made approximately 6 suicide attempts during his lifetime either by cutting his wrists or overdosing on medications.  Patient reports a history of approximately 12 lifetime inpatient psychiatric hospitalizations which are typically due to depression, suicidal ideation or suicide attempts.  He states his most recent inpatient psychiatric hospitalization was in 2021.  He has a therapist and sees Dr. Donell Beers for medication management.  Current outpatient medications include doxepin 25 mg at bedtime, Klonopin 1 mg 3 times daily, Vyvanse 40 mg daily and Lithobid 600 mg at bedtime.  Patient states he only took lithium for about 1 week and experienced flank pain that he attributed to kidney failure and bruising so he discontinued it.  Patient denies any history of nonsuicidal self-injurious behavior.  Past Medical History:  Past Medical History:  Diagnosis Date   Anxiety    Bipolar disorder (HCC)    Depression    PTSD (post-traumatic stress disorder)     Past Surgical History:  Procedure Laterality Date   cholesterol     WISDOM TOOTH EXTRACTION     Family History:  Family History  Problem Relation Age of Onset   Cancer Mother    Cancer Father    Depression Father    Family Psychiatric  History: Patient reports family psychiatric history significant for depression and paranoia in his father.  He denies any known family history of alcohol or substance problems.  He denies any known family history of suicide. Social History:  Social History   Substance and Sexual Activity  Alcohol Use No     Social History   Substance and Sexual Activity  Drug Use No   Types: Marijuana   Comment: denies    Social  History   Socioeconomic History   Marital status: Divorced    Spouse name: Not on file   Number of children: Not on file   Years of education: Not on file   Highest education level: Not on file  Occupational History   Not on file  Tobacco Use   Smoking status: Former    Packs/day: 0.60    Years: 19.00    Pack years: 11.40    Types: Cigarettes   Smokeless tobacco: Former   Tobacco comments:    Has cut back to 4 a day and trying to stop  Vaping Use   Vaping Use: Never used  Substance and Sexual Activity   Alcohol use: No   Drug use: No    Types: Marijuana    Comment: denies   Sexual activity: Yes    Partners: Female    Birth control/protection: None  Other Topics Concern   Not on file  Social History Narrative   Not on file   Social Determinants of Health  Financial Resource Strain: Not on file  Food Insecurity: Not on file  Transportation Needs: Not on file  Physical Activity: Not on file  Stress: Not on file  Social Connections: Not on file   Additional Social History:     Sleep: Fair  Appetite:  Good  Current Medications: Current Facility-Administered Medications  Medication Dose Route Frequency Provider Last Rate Last Admin   acetaminophen (TYLENOL) tablet 650 mg  650 mg Oral Q6H PRN Laveda Abbe, NP   650 mg at 11/27/20 1553   alum & mag hydroxide-simeth (MAALOX/MYLANTA) 200-200-20 MG/5ML suspension 30 mL  30 mL Oral Q4H PRN Novella Olive, NP       diphenhydrAMINE (BENADRYL) capsule 25 mg  25 mg Oral Q6H PRN Novella Olive, NP       hydrOXYzine (ATARAX/VISTARIL) tablet 25 mg  25 mg Oral TID PRN Novella Olive, NP       lithium carbonate (LITHOBID) CR tablet 300 mg  300 mg Oral Q12H Claudie Revering, MD   300 mg at 11/28/20 2694   magnesium hydroxide (MILK OF MAGNESIA) suspension 30 mL  30 mL Oral Daily PRN Novella Olive, NP       multivitamin with minerals tablet 1 tablet  1 tablet Oral Daily Laveda Abbe, NP   1 tablet at 11/28/20  8546   nicotine polacrilex (NICORETTE) gum 2 mg  2 mg Oral PRN Comer Locket, MD   2 mg at 11/27/20 1952   QUEtiapine (SEROQUEL) tablet 50 mg  50 mg Oral QHS PRN Laveda Abbe, NP   50 mg at 11/27/20 2158   thiamine tablet 100 mg  100 mg Oral Daily Laveda Abbe, NP   100 mg at 11/28/20 2703   traZODone (DESYREL) tablet 50 mg  50 mg Oral QHS PRN,MR X 1 Claudie Revering, MD   50 mg at 11/25/20 2110    Lab Results: No results found for this or any previous visit (from the past 48 hour(s)).  Blood Alcohol level:  Lab Results  Component Value Date   ETH <5 06/03/2016   ETH <5 05/06/2015    Metabolic Disorder Labs: Lab Results  Component Value Date   HGBA1C 5.8 (H) 11/23/2020   MPG 120 11/23/2020   MPG 103 06/04/2016   Lab Results  Component Value Date   PROLACTIN 19.5 (H) 06/04/2016   Lab Results  Component Value Date   CHOL 199 11/23/2020   TRIG 255 (H) 11/23/2020   HDL 31 (L) 11/23/2020   CHOLHDL 6.4 11/23/2020   VLDL 51 (H) 11/23/2020   LDLCALC 117 (H) 11/23/2020   LDLCALC 207 (H) 06/04/2016    Physical Findings: AIMS: Facial and Oral Movements Muscles of Facial Expression: None, normal Lips and Perioral Area: None, normal Jaw: None, normal Tongue: None, normal,Extremity Movements Upper (arms, wrists, hands, fingers): None, normal Lower (legs, knees, ankles, toes): None, normal, Trunk Movements Neck, shoulders, hips: None, normal, Overall Severity Severity of abnormal movements (highest score from questions above): None, normal Incapacitation due to abnormal movements: None, normal Patient's awareness of abnormal movements (rate only patient's report): No Awareness, Dental Status Current problems with teeth and/or dentures?: No Does patient usually wear dentures?: No  CIWA:  CIWA-Ar Total: 1 COWS:     Musculoskeletal: Strength & Muscle Tone: within normal limits Gait & Station: normal Patient leans: N/A  Psychiatric Specialty  Exam:  Presentation  General Appearance: Casual; Fairly Groomed  Eye Contact:Good  Speech:Clear and Coherent;  Normal Rate  Speech Volume:Normal  Handedness: No data recorded  Mood and Affect  Mood:Anxious  Affect:Appropriate; Congruent   Thought Process  Thought Processes:Coherent  Descriptions of Associations:Intact  Orientation:Full (Time, Place and Person)  Thought Content:Logical  History of Schizophrenia/Schizoaffective disorder:Yes Phillip [name] reported that he was diagnosed with schizoaffective disorder in the past 5 years.)  Duration of Psychotic Symptoms:Greater than six months  Hallucinations:No data recorded Ideas of Reference:None  Suicidal Thoughts:No data recorded Homicidal Thoughts:No data recorded  Sensorium  Memory:Immediate Good; Recent Good  Judgment:Fair  Insight:Shallow   Executive Functions  Concentration:Good  Attention Span:Good  Recall:Good  Fund of Knowledge:Good  Language:Good   Psychomotor Activity  Psychomotor Activity: No data recorded  Assets  Assets:Communication Skills; Desire for Improvement; Housing; Resilience; Social Support; Vocational/Educational   Sleep  Sleep: No data recorded   Physical Exam: Physical Exam Vitals and nursing note reviewed.  Constitutional:      Appearance: Normal appearance.  HENT:     Head: Normocephalic and atraumatic.  Pulmonary:     Effort: Pulmonary effort is normal.  Musculoskeletal:        General: Normal range of motion.     Cervical back: Normal range of motion.  Neurological:     General: No focal deficit present.     Mental Status: He is alert and oriented to person, place, and time.  Psychiatric:        Attention and Perception: Attention normal. He does not perceive auditory or visual hallucinations.        Mood and Affect: Mood normal.        Speech: Speech normal.        Behavior: Behavior normal. Behavior is cooperative.        Thought Content: Thought  content normal. Thought content is not paranoid or delusional. Thought content does not include homicidal or suicidal ideation. Thought content does not include homicidal or suicidal plan.        Cognition and Memory: Cognition normal.   Review of Systems  Constitutional: Negative.  Negative for fever.  HENT: Negative.  Negative for congestion, sinus pain and sore throat.   Respiratory: Negative.  Negative for cough, shortness of breath and wheezing.   Cardiovascular: Negative.  Negative for chest pain.  Gastrointestinal: Negative.   Genitourinary: Negative.   Musculoskeletal: Negative.   Neurological: Negative.    Blood pressure 117/87, pulse 91, temperature 97.7 F (36.5 C), temperature source Oral, resp. rate 16, height 5\' 10"  (1.778 m), weight 86.6 kg, SpO2 100 %. Body mass index is 27.41 kg/m.   Treatment Plan Summary: Daily contact with patient to assess and evaluate symptoms and progress in treatment and Medication management  Will continue today 11/27/2020 plan as below except where it is noted. No medication changes today.    Benzodiazepine use -Patient was prescribed clonazepam 1 mg 3 times daily as an outpatient which was not continued after admission due to reported recent overdose on clonazepam -Continue CIWA protocol with standing dose lorazepam taper, comfort meds and PRN lorazepam for CIWA scores >10 to cover for any possible benzodiazepine withdrawal.  Anxiety -Continue hydroxyzine 25 mg 3 times daily as needed for anxiety  Insomnia -Discontinue Seroquel 50 mg PO at bedtime for mood/sleep. Patient took only one dose.  -Continue trazodone 50 mg nightly as needed for insomnia   Major depressive disorder, recurrent versus bipolar disorder -Continue lithium carbonate 300 mg Q12H for mood symptoms.  EKG performed on 9/1 showed normal sinus rhythm with no QT prolongation.  Labs performed on 11/23/2020 include creatinine of 1.05, electrolytes were WNL and TSH was WNL.     Will obtain CMP, Lithium levels on Monday 11-29-20 morning. Will obtain repeat COVID PCR Monday as well.    Continue every 15 minute safety checks Discharge planning in progress.  Patient will need referral to outpatient therapist at discharge.  He would benefit from participation in DBT program if he has access to DBT program.      Laveda Abbe, NP 11/28/2020, 2:14 PM

## 2020-11-28 NOTE — Group Note (Signed)
Clinical Social Work Note  Group was not held in person due to COVID-19 outbreak and isolation requirements on the unit. CSW provided handouts on building healthy supports to work on individually in rooms.  Obed Samek Grossman-Orr, LCSW 11/28/2020, 12:19 PM    

## 2020-11-28 NOTE — Progress Notes (Signed)
Pt is alert and oriented to person, place, time and situation. Pt is calm, cooperative, denies SI/HI/AVH. Pt is soft spoken, pleasant, smiles on contact, is medication complaint. Pt denies feelings of depression or anxiety. Pt has been social on the telephone talking many times throughout the day to various people on the telephone. Will continue to monitor pt per Q15 minute face checks and monitor for safety and progress.

## 2020-11-28 NOTE — [facility] Group Notes (Signed)
The focus of this group is to help patients establish daily goals to achieve during treatment and discuss how the patient can incorporate goal setting into their daily lives to aide in recovery.  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

## 2020-11-28 NOTE — Progress Notes (Signed)
Patient has been up in his doorway interacting with other peers on the hall. He reports having had a good day and feels that the Lithium is helping with his mood. He reports that he really like it here and could stay here as long as he can leave and go to church and come back. He plans to live with another church member who he reports suffers from depression also. He is hopeful that they can be a support for each other. Support given and safety maintained with 15 min checks.

## 2020-11-28 NOTE — [facility] Group Notes (Signed)
Adult Psychoeducational Group Note  Date:  11/28/2020 Time:  4:46 PM  Group Topic/Focus:  Self Care:   The focus of this group is to help patients understand the importance of self-care in order to improve or restore emotional, physical, spiritual, interpersonal, and financial health.  Participation Level:  Active  Participation Quality:  Appropriate  Affect:  Appropriate  Cognitive:  Appropriate  Insight: Appropriate  Engagement in Group:  Engaged  Modes of Intervention:  Clarification  Additional Comments:    Donell Beers 11/28/2020, 4:46 PM

## 2020-11-29 ENCOUNTER — Encounter (HOSPITAL_COMMUNITY): Payer: Self-pay

## 2020-11-29 DIAGNOSIS — F332 Major depressive disorder, recurrent severe without psychotic features: Secondary | ICD-10-CM | POA: Diagnosis not present

## 2020-11-29 LAB — COMPREHENSIVE METABOLIC PANEL
ALT: 63 U/L — ABNORMAL HIGH (ref 0–44)
AST: 33 U/L (ref 15–41)
Albumin: 4.7 g/dL (ref 3.5–5.0)
Alkaline Phosphatase: 59 U/L (ref 38–126)
Anion gap: 7 (ref 5–15)
BUN: 18 mg/dL (ref 6–20)
CO2: 26 mmol/L (ref 22–32)
Calcium: 9.8 mg/dL (ref 8.9–10.3)
Chloride: 107 mmol/L (ref 98–111)
Creatinine, Ser: 0.97 mg/dL (ref 0.61–1.24)
GFR, Estimated: >60 mL/min (ref >60)
Glucose, Bld: 113 mg/dL — ABNORMAL HIGH (ref 70–99)
Potassium: 4.2 mmol/L (ref 3.5–5.1)
Sodium: 140 mmol/L (ref 135–145)
Total Bilirubin: 0.5 mg/dL (ref 0.3–1.2)
Total Protein: 7.8 g/dL (ref 6.5–8.1)

## 2020-11-29 LAB — RESP PANEL BY RT-PCR (FLU A&B, COVID) ARPGX2
Influenza A by PCR: NEGATIVE
Influenza B by PCR: NEGATIVE
SARS Coronavirus 2 by RT PCR: NEGATIVE

## 2020-11-29 LAB — LITHIUM LEVEL: Lithium Lvl: 0.32 mmol/L — ABNORMAL LOW (ref 0.60–1.20)

## 2020-11-29 MED ORDER — ADULT MULTIVITAMIN W/MINERALS CH: 1.0000 | ORAL_TABLET | Freq: Every day | ORAL | Status: AC

## 2020-11-29 MED ORDER — NICOTINE POLACRILEX 2 MG MT GUM: 2.0000 mg | CHEWING_GUM | OROMUCOSAL | 0 refills | Status: DC | PRN

## 2020-11-29 MED ORDER — LITHIUM CARBONATE ER 300 MG PO TBCR: 300.0000 mg | EXTENDED_RELEASE_TABLET | Freq: Two times a day (BID) | ORAL | 0 refills | Status: DC

## 2020-11-29 NOTE — BH IP Treatment Plan (Signed)
Interdisciplinary Treatment and Diagnostic Plan Update  11/29/2020 Time of Session: 10:20am Phillip [name] MRN: [id number]  Principal Diagnosis: Major depressive disorder, recurrent, severe without psychotic features (HCC)  Secondary Diagnoses: Principal Problem:   Major depressive disorder, recurrent, severe without psychotic features (HCC) Active Problems:   Borderline personality disorder (HCC)   Anxiety disorder, unspecified   Current Medications:  Current Facility-Administered Medications  Medication Dose Route Frequency Provider Last Rate Last Admin   acetaminophen (TYLENOL) tablet 650 mg  650 mg Oral Q6H PRN Laveda Abbe, NP   650 mg at 11/27/20 1553   alum & mag hydroxide-simeth (MAALOX/MYLANTA) 200-200-20 MG/5ML suspension 30 mL  30 mL Oral Q4H PRN Novella Olive, NP       diphenhydrAMINE (BENADRYL) capsule 25 mg  25 mg Oral Q6H PRN Novella Olive, NP       hydrOXYzine (ATARAX/VISTARIL) tablet 25 mg  25 mg Oral TID PRN Novella Olive, NP       lithium carbonate (LITHOBID) CR tablet 300 mg  300 mg Oral Q12H Claudie Revering, MD   300 mg at 11/29/20 0753   magnesium hydroxide (MILK OF MAGNESIA) suspension 30 mL  30 mL Oral Daily PRN Novella Olive, NP       multivitamin with minerals tablet 1 tablet  1 tablet Oral Daily Laveda Abbe, NP   1 tablet at 11/29/20 9449   nicotine polacrilex (NICORETTE) gum 2 mg  2 mg Oral PRN Comer Locket, MD   2 mg at 11/29/20 0754   thiamine tablet 100 mg  100 mg Oral Daily Laveda Abbe, NP   100 mg at 11/29/20 6759   traZODone (DESYREL) tablet 50 mg  50 mg Oral QHS PRN Laveda Abbe, NP   50 mg at 11/28/20 2133   PTA Medications: Medications Prior to Admission  Medication Sig Dispense Refill Last Dose   clonazePAM (KLONOPIN) 1 MG tablet Take one tablet po tid 90 tablet 3    doxepin (SINEQUAN) 25 MG capsule Take 1 capsule (25mg  total) by mouth at bedtime 30 capsule 1    lisdexamfetamine (VYVANSE) 40 MG  capsule 1 qam 30 capsule 0    lithium carbonate (LITHOBID) 300 MG CR tablet Take 2 tablets (600 mg total) by mouth every evening. (Patient not taking: Reported on 11/23/2020) 60 tablet 5 Not Taking    Patient Stressors: Educational concerns   Financial difficulties   Medication change or noncompliance    Patient Strengths: Ability for insight  Average or above average 11/25/2020 for treatment/growth  Physical Health  Supportive family/friends   Treatment Modalities: Medication Management, Group therapy, Case management,  1 to 1 session with clinician, Psychoeducation, Recreational therapy.   Physician Treatment Plan for Primary Diagnosis: Major depressive disorder, recurrent, severe without psychotic features (HCC) Long Term Goal(s): Improvement in symptoms so as ready for discharge   Short Term Goals: Ability to identify changes in lifestyle to reduce recurrence of condition will improve Ability to verbalize feelings will improve Ability to disclose and discuss suicidal ideas Ability to demonstrate self-control will improve Ability to identify and develop effective coping behaviors will improve Ability to maintain clinical measurements within normal limits will improve Compliance with prescribed medications will improve Ability to identify triggers associated with substance abuse/mental health issues will improve  Medication Management: Evaluate patient's response, side effects, and tolerance of medication regimen.  Therapeutic Interventions: 1 to 1 sessions, Unit Group sessions and Medication administration.  Evaluation of Outcomes: Adequate for Discharge  Physician Treatment Plan for Secondary Diagnosis: Principal Problem:   Major depressive disorder, recurrent, severe without psychotic features (HCC) Active Problems:   Borderline personality disorder (HCC)   Anxiety disorder, unspecified  Long Term Goal(s): Improvement in symptoms so  as ready for discharge   Short Term Goals: Ability to identify changes in lifestyle to reduce recurrence of condition will improve Ability to verbalize feelings will improve Ability to disclose and discuss suicidal ideas Ability to demonstrate self-control will improve Ability to identify and develop effective coping behaviors will improve Ability to maintain clinical measurements within normal limits will improve Compliance with prescribed medications will improve Ability to identify triggers associated with substance abuse/mental health issues will improve     Medication Management: Evaluate patient's response, side effects, and tolerance of medication regimen.  Therapeutic Interventions: 1 to 1 sessions, Unit Group sessions and Medication administration.  Evaluation of Outcomes: Adequate for Discharge   RN Treatment Plan for Primary Diagnosis: Major depressive disorder, recurrent, severe without psychotic features (HCC) Long Term Goal(s): Knowledge of disease and therapeutic regimen to maintain health will improve  Short Term Goals: Ability to remain free from injury will improve, Ability to verbalize frustration and anger appropriately will improve, and Compliance with prescribed medications will improve  Medication Management: RN will administer medications as ordered by provider, will assess and evaluate patient's response and provide education to patient for prescribed medication. RN will report any adverse and/or side effects to prescribing provider.  Therapeutic Interventions: 1 on 1 counseling sessions, Psychoeducation, Medication administration, Evaluate responses to treatment, Monitor vital signs and CBGs as ordered, Perform/monitor CIWA, COWS, AIMS and Fall Risk screenings as ordered, Perform wound care treatments as ordered.  Evaluation of Outcomes: Adequate for Discharge   LCSW Treatment Plan for Primary Diagnosis: Major depressive disorder, recurrent, severe without  psychotic features (HCC) Long Term Goal(s): Safe transition to appropriate next level of care at discharge, Engage patient in therapeutic group addressing interpersonal concerns.  Short Term Goals: Engage patient in aftercare planning with referrals and resources, Increase social support, Identify triggers associated with mental health/substance abuse issues, and Increase skills for wellness and recovery  Therapeutic Interventions: Assess for all discharge needs, 1 to 1 time with Social worker, Explore available resources and support systems, Assess for adequacy in community support network, Educate family and significant other(s) on suicide prevention, Complete Psychosocial Assessment, Interpersonal group therapy.  Evaluation of Outcomes: Adequate for Discharge   Progress in Treatment: Attending groups: Yes. Participating in groups: Yes. Taking medication as prescribed: Yes. Toleration medication: Yes. Family/Significant other contact made: Yes, individual(s) contacted:  friend Patient understands diagnosis: Yes. Discussing patient identified problems/goals with staff: Yes. Medical problems stabilized or resolved: Yes. Denies suicidal/homicidal ideation: Yes. Issues/concerns per patient self-inventory: No.   New problem(s) identified: No, Describe:  none  New Short Term/Long Term Goal(s): detox, medication management for mood stabilization; elimination of SI thoughts; development of comprehensive mental wellness/sobriety plan  Patient Goals:  "To be less anxious"   Discharge Plan or Barriers: Pt has been set up with outpatient therapy and medication management through [facility] at discharge.  Reason for Continuation of Hospitalization: Medication stabilization  Estimated Length of Stay: Adequate for Discharge   Scribe for Treatment Team: Otelia Santee, LCSW 11/29/2020 11:08 AM

## 2020-11-29 NOTE — Progress Notes (Signed)
Discharge Note:   Pt discharged at 14:00 via his ride. Upon discharge pt is A&Ox4, calm, cooperative, denies SI/HI/AVH, denies depression and anxiety. Pt was given discharge instructions which include pt's follow up appointments, and pt's discharge medication education, prescriptions called in by provider to pt's outpt pharmacy. All personal belongings returned to pt upon discharge. 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 Pt voiced no c/o's, no distress noted, none reported.
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
## 2020-11-29 NOTE — Progress Notes (Signed)
Adult Psychoeducational Group Note  Date:  11/29/2020 Time:  12:08 AM  Group Topic/Focus:  Wrap-Up Group:   The focus of this group is to help patients review their daily goal of treatment and discuss progress on daily workbooks.  Participation Level:  Active  Participation Quality:  Appropriate  Affect:  Appropriate  Cognitive:  Appropriate  Insight: Appropriate  Engagement in Group:  Developing/Improving  Modes of Intervention:  Discussion  Additional Comments:  Pt stated his goal for today was to focus on his treatment plan. Pt stated he accomplished his goal today. Pt stated he talked with his doctor but did not get to talk with his social worker about his care today. Pt rated his overall day a 10. Pt stated he was able to contact his girlfriend and a family friend today which improved his overall day. Pt stated he felt better about himself today. Pt stated he brought back all meals today because of the Covid outbreak. Pt stated he took all medications provided today. Pt stated he attend all groups held today. Pt stated his appetite was pretty good today. Pt rated sleep last night was pretty good. Pt stated the goal tonight was to get some rest. Pt stated he had no physical pain today. Pt deny visual hallucinations and auditory issues tonight. Pt denies thoughts of harming himself or others. Pt stated he would alert staff if anything changed.  Felipa Furnace 11/29/2020, 12:08 AM

## 2020-11-29 NOTE — [facility] Group Notes (Signed)
Topic:   Due to the acuity, complex discharge plans, and Covid-19 precautions, group was not held. Patient was provided therapeutic worksheets and asked to meet with CSW as needed. 
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
## 2020-11-29 NOTE — Progress Notes (Signed)
Recreation Therapy Notes  Date:  9.5.22 Time: 0930 Location: [address]    Group Topic: Stress Management   Goal Area(s) Addresses:  Patient will identify positive stress management techniques. Patient will identify benefits of using stress management post d/c.   Behavioral Response:  Attentive   Intervention: Packet   Activity :  Patients were given a packet that covered ways to reduce stress.  It also gave examples of techniques to use to deal with stress such as progressive muscle relaxation, deep breathing and guided imagery.       Education:  Stress Management, Discharge Planning.    Education Outcome: Acknowledges Education   Clinical Observations/Feedback: Pt was attentive as the packet was discussed.         Caroll Rancher, LRT/CTRS         Caroll Rancher A 11/29/2020 11:35 AM

## 2020-11-29 NOTE — Progress Notes (Signed)
  [facility] Adult Case Management Discharge Plan :  Will you be returning to the same living situation after discharge:  Yes,  home At discharge, do you have transportation home?: Yes,  Friend  Do you have the ability to pay for your medications: Yes,  Medicare  Release of information consent forms completed and in the chart;  Patient's signature needed at discharge.  Patient to Follow up at:  Follow-up Information     [facility] Follow up on 12/21/2020.   Specialty: Behavioral Health Why: You also have an appointment on 12/21/20 at 1:30 pm for medication management services. Contact information: [address] [place] [address] [phone]        [facility] [place]. Go on 12/07/2020.   Specialty: Behavioral Health Why: You have an appointment for therapy services on 12/07/20 at 3:00 pm. This appointment will be held in person. Contact information: [phone] mc [place] [address] [phone]        [facility] Follow up.   Why: Please call to schedule an appointment for DBT therapy services. Contact information: [address] [place] [address] [phone]         [facility] Follow up.   Why: Please use this website and helpline to sign up for faith-based mental health support groups.  These will be virtual and can work around your schedule. Contact information: Website: [employer]  Help Line at [phone].                Next level of care provider has access to [facility] Link:yes  Safety Planning and Suicide Prevention discussed: Yes,  With patient and friend      Has patient been referred to the Quitline?: N/A patient is not a smoker  Patient has been referred for addiction treatment: N/A  Aram Beecham,  LCSWA 11/29/2020, 10:32 AM

## 2020-11-30 ENCOUNTER — Telehealth (HOSPITAL_COMMUNITY): Payer: Self-pay | Admitting: *Deleted

## 2020-11-30 NOTE — Telephone Encounter (Signed)
Pt called and spoke with this nurse to inform that he was just d/c from [facility] yesterday s/p overdose on his medications (Klonopin, doxepin) after being off his Lithium for "awhile now". Pt is now feeling s/s of withdrawal and requesting to go back on the Klonopin, pt has an appointment upcoming in office on 12/07/20. Please review.

## 2020-12-01 ENCOUNTER — Other Ambulatory Visit (HOSPITAL_COMMUNITY): Payer: Self-pay | Admitting: *Deleted

## 2020-12-01 ENCOUNTER — Telehealth (HOSPITAL_COMMUNITY): Payer: Self-pay | Admitting: *Deleted

## 2020-12-01 ENCOUNTER — Other Ambulatory Visit (HOSPITAL_COMMUNITY): Payer: Self-pay | Admitting: Psychiatry

## 2020-12-01 MED ORDER — DOXEPIN HCL 50 MG PO CAPS: ORAL_CAPSULE | ORAL | 0 refills | Status: DC

## 2020-12-01 MED ORDER — LORAZEPAM 1 MG PO TABS: ORAL_TABLET | ORAL | 0 refills | Status: DC

## 2020-12-01 MED ORDER — RISPERIDONE 1 MG PO TABS: 1.0000 mg | ORAL_TABLET | Freq: Two times a day (BID) | ORAL | 2 refills | Status: DC

## 2020-12-01 NOTE — Telephone Encounter (Signed)
T/C placed to pt after consulting with Dr. Donell Beers regarding refilling Klonopin after pt recently overdosed on this medication and was admitted to the [facility]. Pt advised that provider has ordered Risperdal 1 mg bid to help with anxiety as well as some paranoia pt is experiencing. Pt refused medication and told this nurse to cancel any appointments he has upcoming with Dr. Donell Beers. Writer asked pt several times if he was having thoughts of self harm, si, or hi. Pt denied all and said "thank everyone for me" and "thank you again but I have to go now" upon further questioning. Nurse asked Dr. Donell Beers to call and speak with pt to which he absolutely agreed.

## 2020-12-07 ENCOUNTER — Telehealth (HOSPITAL_COMMUNITY): Payer: Self-pay | Admitting: Licensed Clinical Social Worker

## 2020-12-07 ENCOUNTER — Ambulatory Visit (INDEPENDENT_AMBULATORY_CARE_PROVIDER_SITE_OTHER): Payer: 59 | Admitting: Psychiatry

## 2020-12-07 ENCOUNTER — Other Ambulatory Visit: Payer: Self-pay

## 2020-12-07 ENCOUNTER — Ambulatory Visit (HOSPITAL_COMMUNITY): Payer: 59 | Admitting: Licensed Clinical Social Worker

## 2020-12-07 DIAGNOSIS — F250 Schizoaffective disorder, bipolar type: Secondary | ICD-10-CM | POA: Diagnosis not present

## 2020-12-07 MED ORDER — CLORAZEPATE DIPOTASSIUM 3.75 MG PO TABS: ORAL_TABLET | ORAL | 3 refills | Status: DC

## 2020-12-07 MED ORDER — DOXEPIN HCL 25 MG PO CAPS: ORAL_CAPSULE | ORAL | 3 refills | Status: DC

## 2020-12-07 MED ORDER — ASENAPINE MALEATE 5 MG SL SUBL: SUBLINGUAL_TABLET | SUBLINGUAL | 3 refills | Status: DC

## 2020-12-07 NOTE — Progress Notes (Signed)
Virtual Visit via Telephone Note  Today   Today the patient is doing fair to poor.  He is seen with his minister Ed who he is living with.  Since her last visit the patient was hospitalized for serious overdose with high doses of Klonopin.  He has been hospitalized has been discharged.  The patient is only doing fair.  He has an appointment with his therapist later today.  When he got out of the hospital he was taken off of Klonopin and apparently was feeling a lot of anxiety and withdrawal.  For approximately 7 to 8 days I prescribed Ativan 1 mg twice daily which she took appropriately.  He also took 50 mg of doxepin which helped him to some degree.  While the patient is experiencing a great deal of anxiety his major symptom seems to be paranoia.  He is not having any hallucinatory activities.  He is not acutely suicidal at this time.  He describes being persistently depressed.  He continues taking lithium 300 mg twice daily.  He no longer takes Vyvanse.  Today we reviewed all the different antipsychotic medicines he has been on.  Most of them made him sick or made him apathetic.  Today we will go to prescribe Saphris 5 mg twice daily.  Today we will get a change his Ativan from 1 mg twice daily to Tranxene 3.75 mg 3 times daily.  We will give him 2 weeks of this agent with refills.  Today I was firm and will not go back to Klonopin.  This is what he requested.  We will not go to Vyvanse.  We will continue the lithium a low-dose of Tranxene continue doxepin but reduced the dose to 25 mg.  Her most important intervention is Saphris and to continue in therapy here.  He will return to see me in 5 to 6 weeks.  I think this patient is fragile.  I do not think he can tolerate a partial hospitalization.  My hope is that this therapist can make a good connection.    Virtual Visit via Telephone Note   Assessment/plan  This patient's diagnosis is that of schizoaffective disorder.  He will continue taking lithium  300 mg twice daily.  Today we will begin him on Tranxene 3.75 mg 3 times daily.  We will continue taking doxepin but at a lower dose of 25 mg.  Today he will begin on Saphris 5 mg sublingual twice daily.  When he comes back in 5 to 6 weeks we will consider increasing his Saphris.  At this time the patient denies being acutely suicidal.  He has agreed to see his therapist and give me the opportunity to talk to him or the friends living with Ed.  Gypsy Balsam, MD

## 2020-12-07 NOTE — Telephone Encounter (Signed)
Phillip [name] had an in-person therapy appointment scheduled today for 3pm.  Clinician outreached Phillip [name] at 3pm when he was not present in office for this meeting, and left a voicemail reminding him of session, including callback number for front desk.  Clinician attempted one more outreach call at 3:15pm, but did not receive a response.  Clinician informed front desk of this no-show event.    Noralee Stain, [place], LCAS 12/07/20

## 2020-12-21 ENCOUNTER — Telehealth (HOSPITAL_COMMUNITY): Payer: 59 | Admitting: Psychiatry

## 2021-01-19 ENCOUNTER — Other Ambulatory Visit: Payer: Self-pay

## 2021-01-19 ENCOUNTER — Ambulatory Visit (HOSPITAL_BASED_OUTPATIENT_CLINIC_OR_DEPARTMENT_OTHER): Payer: 59 | Admitting: Psychiatry

## 2021-01-19 DIAGNOSIS — F250 Schizoaffective disorder, bipolar type: Secondary | ICD-10-CM

## 2021-01-19 DIAGNOSIS — F259 Schizoaffective disorder, unspecified: Secondary | ICD-10-CM

## 2021-01-19 MED ORDER — LISDEXAMFETAMINE DIMESYLATE 40 MG PO CAPS: 40.0000 mg | ORAL_CAPSULE | Freq: Every day | ORAL | 0 refills | Status: DC

## 2021-01-19 MED ORDER — DOXEPIN HCL 25 MG PO CAPS: ORAL_CAPSULE | ORAL | 4 refills | Status: DC

## 2021-01-19 MED ORDER — LITHIUM CARBONATE ER 300 MG PO TBCR: 300.0000 mg | EXTENDED_RELEASE_TABLET | Freq: Two times a day (BID) | ORAL | 4 refills | Status: DC

## 2021-01-19 MED ORDER — ASENAPINE MALEATE 5 MG SL SUBL: SUBLINGUAL_TABLET | SUBLINGUAL | 3 refills | Status: DC

## 2021-01-19 NOTE — Progress Notes (Signed)
Virtual Visit via Telephone Note  Today patient was interviewed over the phone.  He is back home in his cabin.  Patient is doing much much better.  He is no longer suicidal at all.  In fact he is back in school.  He goes to [employer] and takes 3 classes.  He is very excited about.  His friends and was very helpful but now he is left and his involvement.  He felt that Ed was too possessive.  The patient is handling things pretty well.  He is happy with life.  He says the Tranxene 3.75 mg 3 times daily is extremely helpful.  I shared with him today no longer felt comfortable prescribing Klonopin.  He seems to understand that pretty well.  The patient is sleeping and eating very well.  He enjoys where he lives.  We had a long talk about his use of stimulants for ADHD.  Now he is back in school and I think it is particularly important to be back on a stimulant.  He misunderstood that I could not prescribe Vyvanse after his overdose.  I shared that was not the case.  Today we will go ahead and start him back on Vyvanse 40 mg a day that he was on before.  The patient shows no overt evidence of psychosis at this time.  He never drinks alcohol and uses no drugs.  He is engaged in school which is great.  His thoughts are clear organized and connected.  Suicide is the last thing on his mind.  We talked about his issue of stopping his medications.  This time it was the same as over a dozen times where he gets stable on medicines and then decides to stop them.  He then gets intensely profoundly depressed suicidal and makes a serious suicide attempt.  I shared with him that I am very concerned about this and that in fact he needs to come back to see me again in 6 to 7 weeks.  At this time I will continue giving him his Tranxene on a 2-week basis.  Once again the patient has not had an.  He seems to be doing well.  The patient says that the only problem is the Saphris has a really bad taste with his mouth and makes him  quite sedated and gives him a headache.     Today      Virtual Visit via Telephone Note   Assessment/plan Gypsy Balsam, MD  The patient's problem seems to be that of most likely schizoaffective disorder.  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  Our treatment plan will be to reduce his Saphris to just taking it at night.  To continue his lithium 300 mg twice daily and we will attempt to call him to get some blood work for it.  His second problem is some form of anxiety disorder most likely that of an adjustment disorder with an anxious mood state.  For now we will continue a 2-week supply of Tranxene 3.75 mg 3 times daily.  On his next visit in about 7 weeks I will consider giving him a month supply at 1 time.  His third problem seems to be attention deficit disorder.  Today we will restart him on Vyvanse 40 mg a day.  When his next visit we will talk about the possibility or clarity about his therapy.  He was seeing a therapist in this office but for now he seems  to be pretty stable.  We will continue in school and return to see me in 6 to 7 weeks.  Patient knows to call us.  He says his major problems not only to me but to GOD that he will never stop his medicines again and avoid suicidal ideation.

## 2021-01-27 ENCOUNTER — Other Ambulatory Visit (HOSPITAL_COMMUNITY): Payer: Self-pay | Admitting: Psychiatry

## 2021-02-01 ENCOUNTER — Other Ambulatory Visit (HOSPITAL_COMMUNITY): Payer: Self-pay

## 2021-02-01 MED ORDER — CLORAZEPATE DIPOTASSIUM 3.75 MG PO TABS: ORAL_TABLET | ORAL | 3 refills | Status: DC

## 2021-02-03 ENCOUNTER — Other Ambulatory Visit: Payer: Self-pay

## 2021-02-03 ENCOUNTER — Ambulatory Visit (INDEPENDENT_AMBULATORY_CARE_PROVIDER_SITE_OTHER): Payer: 59 | Admitting: Licensed Clinical Social Worker

## 2021-02-03 ENCOUNTER — Telehealth (HOSPITAL_COMMUNITY): Payer: Self-pay | Admitting: Psychiatry

## 2021-02-03 DIAGNOSIS — F4310 Post-traumatic stress disorder, unspecified: Secondary | ICD-10-CM

## 2021-02-03 DIAGNOSIS — F3164 Bipolar disorder, current episode mixed, severe, with psychotic features: Secondary | ICD-10-CM

## 2021-02-03 NOTE — Telephone Encounter (Signed)
D:  Phillip Stain, LCSW referred pt to [facility]-IOP.  A:  Placed call and oriented patient.  Answered all his questions.  Encouraged pt to verify his insurance benefits.  Pt will start [facility] on 02-09-21 @ 9 a.m.Carolann Littler, LCSW and Dr. Donell Beers.   R:  Pt receptive.

## 2021-02-03 NOTE — Progress Notes (Signed)
Virtual Visit via Telephone Note   I connected with Phillip [name] on 02/03/21 at 1:00pm by telephone and verified that I am speaking with the correct person using two identifiers.   I discussed the limitations, risks, security and privacy concerns of performing an evaluation and management service by telephone and the availability of in person appointments. I also discussed with the patient that there may be a patient responsible charge related to this service. The patient expressed understanding and agreed to proceed.   I discussed the assessment and treatment plan with the patient. The patient was provided an opportunity to ask questions and all were answered. The patient agreed with the plan and demonstrated an understanding of the instructions.   The patient was advised to call back or seek an in-person evaluation if the symptoms worsen or if the condition fails to improve as anticipated.   I provided 1 hour of non-face-to-face time during this encounter.     Phillip Flood, LCSW, LCAS __________________________ THERAPIST PROGRESS NOTE   Session Time: 1:00pm - 2:00pm   Location: Patient: Patient Home Provider: [facility]    Participation Level: Active   Behavioral Response: Alert, anxious mood   Type of Therapy:  Individual Therapy   Treatment Goals addressed: Anxiety, depression, and panic attack management; Medication compliance   Interventions: CBT, safety planning, group referral    Summary: Phillip [name] is a 50 year old divorced Caucasian male that presented for therapy session today and is diagnosed with Bipolar I, most recent episode mixed, severe with psychotic behavior and PTSD.    Suicidal/Homicidal: None; without intent or plan    Therapist Response: Clinician met with Phillip [name] for virtual appointment and assessed for safety, sobriety, and medication compliance.  Clinician had to use phone call to hold session with Phillip [name] today due to him experiencing technical  issues connecting with virtual meeting. Phillip [name] spoke in a manner that was alert, oriented x5, with no evidence or self-report of active SI/HI or A/V H.  Phillip [name] reported ongoing compliance with medication and denied alcohol or illicit substance use.  Clinician inquired about Phillip [name]'s current emotional ratings, as well as any significant changes in thoughts, feelings, or behaviors since last check-in.  Phillip [name] reported scores of 8/10 for depression, 9/10 for anxiety, 0/10 for anger/irritability, and is experiencing 2-3x panic attacks per day.  Phillip [name] reported that his mood has continued to stay inconsistent day to day, stating "My mood has been up and down. I have had my medications changed recently and I'm using a medication holder to stay consistent".  Phillip [name] reported that he did attempt suicide via overdose in August 2022 and was taken to the hospital for stabilization.  Clinician discussed safety planning measures with Phillip [name] as a result of recent attempt, including identifying warning signs that a crisis could be building, healthy distraction strategies to engage in, and reliable supports he can outreach if individual coping methods prove ineffective during crisis.  Phillip [name] participated in safety planning process and was able to identify distraction activities, and agreed to speak with his pastor for support if necessary, call a suicide hotline, or travel to the hospital for assessment should SI return with development of intent and/or plan.  He reported that he also sold all firearms following recent attempt to reinforce safety.  Clinician completed a negative CSSRS screening with Phillip [name] as well, reinforcing that he is at no risk of self-harm at this time.  Clinician also encouraged Phillip [name] to reconsider group therapy as an option  to increase available support and learn additional coping skills to help with stability.  Intervention was effective, as evidenced by Phillip [name] completing safety plan to avoid self-harm, and  expressing increased motivation to consider engaging in weekly [place] groups, stating "That gives me hope, and sounds exciting.  If I don't have human contact I'll probably stay depressed".  Clinician encouraged Phillip [name] to be more consistent with therapy, agreed to pass along Phillip [name]'s contact information to [place] case manager and will continue to monitor.  Plan: Follow up again in 1 week virtually.    Diagnosis:  Bipolar I, most recent episode mixed, severe with psychotic behavior and PTSD.     Phillip [name], [place], LCAS 02/03/21

## 2021-02-09 ENCOUNTER — Other Ambulatory Visit (HOSPITAL_COMMUNITY): Payer: 59 | Attending: Psychiatry | Admitting: Psychiatry

## 2021-02-09 ENCOUNTER — Encounter (HOSPITAL_COMMUNITY): Payer: Self-pay | Admitting: Psychiatry

## 2021-02-09 ENCOUNTER — Other Ambulatory Visit: Payer: Self-pay

## 2021-02-09 DIAGNOSIS — F4310 Post-traumatic stress disorder, unspecified: Secondary | ICD-10-CM | POA: Diagnosis not present

## 2021-02-09 DIAGNOSIS — F3164 Bipolar disorder, current episode mixed, severe, with psychotic features: Secondary | ICD-10-CM | POA: Insufficient documentation

## 2021-02-09 DIAGNOSIS — Z9151 Personal history of suicidal behavior: Secondary | ICD-10-CM | POA: Insufficient documentation

## 2021-02-09 DIAGNOSIS — Z87891 Personal history of nicotine dependence: Secondary | ICD-10-CM | POA: Diagnosis not present

## 2021-02-09 NOTE — Progress Notes (Addendum)
Virtual Visit via Video Note  I connected with Phillip [name] on $[name]$ @ at  9:00 AM EST by a video enabled telemedicine application and verified that I am speaking with the correct person using two identifiers.  Location: Patient: at home Provider: at office   I discussed the limitations of evaluation and management by telemedicine and the availability of in person appointments. The patient expressed understanding and agreed to proceed.  I discussed the assessment and treatment plan with the patient. The patient was provided an opportunity to ask questions and all were answered. The patient agreed with the plan and demonstrated an understanding of the instructions.   The patient was advised to call back or seek an in-person evaluation if the symptoms worsen or if the condition fails to improve as anticipated.  I provided 20 minutes of non-face-to-face time during this encounter.   Carlis Abbott, RITA, M.Ed, CNA   [name], male   DOB: September 10, 1970, 50 y.o.   MRN: [id number] As per pt's therapist most recent note states:   "Phillip [name] is a 50 year old divorced Caucasian male that presented for therapy session [name] and is diagnosed with Bipolar I, most recent episode mixed, severe with psychotic behavior and PTSD.  Clinician met with Phillip [name] for virtual appointment and assessed for safety, sobriety, and medication compliance.  Clinician had to use phone call to hold session with Phillip [name] due to him experiencing technical issues connecting with virtual meeting. Phillip [name] spoke in a manner that was alert, oriented x5, with no evidence or self-report of active SI/HI or A/V H.  Phillip [name] reported ongoing compliance with medication and denied alcohol or illicit substance use.  Clinician inquired about Phillip [name]'s current emotional ratings, as well as any significant changes in thoughts, feelings, or behaviors since last check-in.  Phillip [name] reported scores of 8/10 for depression, 9/10  for anxiety, 0/10 for anger/irritability, and is experiencing 2-3x panic attacks per day.  Phillip [name] reported that his mood has continued to stay inconsistent day to day, stating "My mood has been up and down. I have had my medications changed recently and I'm using a medication holder to stay consistent".  Phillip [name] reported that he did attempt suicide via overdose in August 2022 and was taken to the hospital for stabilization.  Clinician discussed safety planning measures with Phillip [name] as a result of recent attempt, including identifying warning signs that a crisis could be building, healthy distraction strategies to engage in, and reliable supports he can outreach if individual coping methods prove ineffective during crisis.  Phillip [name] participated in safety planning process and was able to identify distraction activities, and agreed to speak with his pastor for support if necessary, call a suicide hotline, or travel to the hospital for assessment should SI return with development of intent and/or plan.  He reported that he also sold all firearms following recent attempt to reinforce safety.  Clinician completed a negative CSSRS screening with Phillip [name] as well, reinforcing that he is at no risk of self-harm at this time.  Clinician also encouraged Phillip [name] to reconsider group therapy as an option to increase available support and learn additional coping skills to help with stability.  Intervention was effective, as evidenced by Phillip [name] completing safety plan to avoid self-harm, and expressing increased motivation to consider engaging in weekly Phillip [name] groups, stating "That gives me hope, and sounds exciting.  If I don't have human contact I'll probably stay depressed".  Clinician encouraged Phillip [name] to be more consistent with therapy, agreed  to pass along Phillip [name]'s contact information to Phillip [name] case manager and will continue to monitor."  Pt started in Phillip [name] [name].  Denies any SI/HI or A/V hallucinations.  On a scale of 1-10; pt  rates his depression at a 7 and anxiety at a 8.  "I feel like I am doing much better.  I feel like I am ready to return to work."  Pt states that the groups went very well this morning.  A:  Oriented pt.  Provided pt with support.  Encouraged pt to take it slow and complete the program first before thinking about RTW.  Pt gave verbal consent for tx, to release chart information to referred providers and to complete any forms if needed.  Pt also gave consent for attending group virtually d/t COVID-19 social distancing restrictions.  Encouraged support groups.  F/U with Dr. Casimiro Needle and Phillip Flood, LCSW.  R:  Pt receptive.  Phillip [name], M.Ed,CNA

## 2021-02-09 NOTE — Progress Notes (Signed)
Virtual Visit via Video Note   I connected with Altamese Dilling on 02/09/21 at  9:00 AM EDT by a video enabled telemedicine application and verified that I am speaking with the correct person using two identifiers.   At orientation to the IOP program, Case Manager discussed the limitations of evaluation and management by telemedicine and the availability of in person appointments. The patient expressed understanding and agreed to proceed with virtual visits throughout the duration of the program.   Location:  Patient: Patient Home Provider: [facility]   History of Present Illness: Bipolar Disorder and PTSD    Observations/Objective: Check In: Case Manager checked in with all participants to review discharge dates, insurance authorizations, work-related documents and needs from the treatment team regarding medications. Jensyn stated needs and engaged in discussion.    Initial Therapeutic Activity: Counselor facilitated a check-in with Keller to assess for safety, sobriety and medication compliance.  Counselor also inquired about Mayes's current emotional ratings, as well as any significant changes in thoughts, feelings or behavior since previous check in.  Gaje presented for session on time and was alert, oriented x5, with no evidence or self-report of SI/HI or A/V H.  Mycah reported compliance with medication and denied use of alcohol or drugs since last check-in.  Khalil reported scores of 0/10 for depression, 3/10 for anxiety, and 0/10 for anger/irritability.  Derril denied outbursts since last check-in.  Jerrol reported that a recent success was going shopping with a friend yesterday for self-care.  Joseph reported that a recent challenge was having a panic attack last night when he realized he had forgotten to take his medication.  Munachimso reported that a success was calming himself down, taking the dose once he got home, and making changes in routine to improve compliance efforts.  Jaicion  reported that his goal today will be to schedule a dentist appointment, and then go to thrift stores ensure time for self-care.     Second Therapeutic Activity: Counselor introduced Peggye Fothergill, [employer] Pharmacist, to provide psychoeducation on topic of medication compliance with members today.  Michelle Nasuti provided psychoeducation on classes of medications such as antidepressants, antipsychotics, what symptoms they are intended to treat, and any side effects one might encounter while on a particular prescription.  Time was allowed for clients to ask any questions they might have of [facility] regarding this specialty.  Intervention was effective, as evidenced by [name] participating in discussion with speaker on the subject, and inquiring about whether any medications that he is currently on could affect weight gain, as well as strategies for mitigating associated risk. [place] was appreciative for clarity on this issue and suggestions from pharmacist on how to manage potential weight gain in healthy manner.  Third Therapeutic Activity: Psycho-educational portion of group was co-facilitated by wellness director (David Stall, MS, MPH, CHES) focused on self-care in daily life. Facilitator and group members discussed presented materials regarding importance of sleep, diet, and exercise. Group members discussed any changes they are willing to make to improve an area of self-care in their lives (physical, psychological, emotional, spiritual, relationship, professional) to improve overall mental health as they continue with treatment.  Intervention was effective, as evidenced by [name] participating in discussion with speaker on the subject, reporting that he would like to improve physical activity by going to the gym more often.  Leeam reported that he recently learned that his insurance company will pay for a membership in order to improve health, so he will plan to outreach  the [employer], and develop a cardio routine to lose weight  through cycling, running, or swimming, stating "I hope it will be nice".  He reported that he would also like to establish a healthier diet to complement this goal, including replacing junk food for healthier choices, and eating smaller, more nutritious meals throughout the day.    Assessment and Plan: Counselor recommends that Adrik remain in IOP treatment to better manage mental health symptoms, ensure stability and pursue completion of treatment plan goals. Counselor recommends adherence to crisis/safety plan, taking medications as prescribed, and following up with medical professionals if any issues arise.     Follow Up Instructions: Counselor will send Webex link for next session. Kenlee was advised to call back or seek an in-person evaluation if the symptoms worsen or if the condition fails to improve as anticipated.     I provided 180 minutes of non-face-to-face time during this encounter.    Noralee Stain, [place], LCAS 02/09/21

## 2021-02-10 ENCOUNTER — Other Ambulatory Visit (HOSPITAL_COMMUNITY): Payer: 59 | Admitting: Psychiatry

## 2021-02-10 ENCOUNTER — Telehealth (HOSPITAL_COMMUNITY): Payer: Self-pay | Admitting: Psychiatry

## 2021-02-10 DIAGNOSIS — F3164 Bipolar disorder, current episode mixed, severe, with psychotic features: Secondary | ICD-10-CM

## 2021-02-10 DIAGNOSIS — F4310 Post-traumatic stress disorder, unspecified: Secondary | ICD-10-CM

## 2021-02-10 NOTE — Progress Notes (Signed)
Virtual Visit via Video Note   I connected with Altamese Dilling on 02/10/21 at  9:00 AM EDT by a video enabled telemedicine application and verified that I am speaking with the correct person using two identifiers.   At orientation to the IOP program, Case Manager discussed the limitations of evaluation and management by telemedicine and the availability of in person appointments. The patient expressed understanding and agreed to proceed with virtual visits throughout the duration of the program.   Location:  Patient: Patient Home Provider: OPT BH Office   History of Present Illness: Bipolar Disorder and PTSD   Observations/Objective: Check In: Case Manager checked in with all participants to review discharge dates, insurance authorizations, work-related documents and needs from the treatment team regarding medications. Wells stated needs and engaged in discussion.    Initial Therapeutic Activity: Counselor facilitated a check-in with Pauline to assess for safety, sobriety and medication compliance.  Counselor also inquired about Ferdinand's current emotional ratings, as well as any significant changes in thoughts, feelings or behavior since previous check in.  Johnthomas presented for session on time and was alert, oriented x5, with no evidence or self-report of SI/HI or A/V H.  Whyatt reported compliance with medication and denied use of alcohol or drugs since last check-in.  Tiron reported scores of 0/10 for depression, 10/10 for anxiety, and 0/10 for anger/irritability.  Garren denied any outbursts since last check-in.  Carston reported that a recent success was visiting his friend to socialize and catch up on things.   Conn reported that a recent challenge was experiencing a panic attack last night and this morning, stating "I feel lethargic today because they always wear me out".  Counselor inquired about Boris's insight into what triggered these attacks.  Asher reported that he visited his  ex-girlfriend that morning after not talking to one another for a few months, and stated "It wasn't a feel-good situation, but I think I brushed it off and it creeped up on me later".  Counselor encouraged Sherard to be mindful of the company that he spends time with and the effect they can have upon his mental health.           Second Therapeutic Activity: Counselor introduced topic of building social support network today.  Counselor explained how this can be defined as having a having a group of healthy people in one's life you can talk to, spend time with, and get help from to improve both mental and physical health.  Counselor noted that some barriers can make it difficult to connect with other people, including the presence of anxiety or depression, or moving to an unfamiliar area.  Group members were asked to assess the current state of their support network, and identify ways that this could be improved.  Tips were given on how to address previously noted barriers, such as strengthening social skills, using relaxation techniques to reduce anxiety, scheduling social time each week, and/or exploring social events nearby which could increase chances of meeting new supports.  Members were also encouraged to consider getting closer to people they already know through suggestions such as outreaching someone by text, email or phone call if they haven't spoken in awhile, doing something nice for a friend/family member unexpectedly, and/or inviting someone over for a game/movie/dinner night. Intervention was effective, as evidenced by [name] participating in discussion on the subject, reporting that he has been isolated for some time now and noticed his mental health decline, which is one reason for  entering group therapy.  Jadarius reported that he has several goals for improving his network, including going to church at least once per week, starting his gym membership, and volunteering somewhere with a meaningful  cause, since all of these settings would offer him the chance to make new connections.  Treylon reported that one challenge will be learning to open up more honestly about personal boundaries with supports, stating "Its hard to talk to people about how I feel sometimes, but I feel like coming to group is a good start.  I just need to make plans and follow through with them".  Jes did not return to group after break, and counselor informed case manager, who Iona Beard by phone, but did not receive an answer, even after leaving voicemail.      Assessment and Plan: Counselor recommends that Lewis remain in IOP treatment to better manage mental health symptoms, ensure stability and pursue completion of treatment plan goals. Counselor recommends adherence to crisis/safety plan, taking medications as prescribed, and following up with medical professionals if any issues arise.     Follow Up Instructions: Counselor will send Webex link for next session. Braedan was advised to call back or seek an in-person evaluation if the symptoms worsen or if the condition fails to improve as anticipated.     I provided 90 minutes of non-face-to-face time during this encounter.   Noralee Stain, [place], LCAS 02/10/21

## 2021-02-11 ENCOUNTER — Telehealth (HOSPITAL_COMMUNITY): Payer: Self-pay | Admitting: Psychiatry

## 2021-02-11 ENCOUNTER — Other Ambulatory Visit (HOSPITAL_COMMUNITY): Payer: 59 | Admitting: Psychiatry

## 2021-02-11 ENCOUNTER — Other Ambulatory Visit: Payer: Self-pay

## 2021-02-13 ENCOUNTER — Encounter (HOSPITAL_COMMUNITY): Payer: Self-pay | Admitting: Family

## 2021-02-13 NOTE — Progress Notes (Signed)
Virtual Visit via Video Note  I connected with Phillip [name] on 02/13/21 at  9:00 AM EST by a video enabled telemedicine application and verified that I am speaking with the correct person using two identifiers.  Location: Patient: Home Provider: Office   I discussed the limitations of evaluation and management by telemedicine and the availability of in person appointments. The patient expressed understanding and agreed to proceed.    I discussed the assessment and treatment plan with the patient. The patient was provided an opportunity to ask questions and all were answered. The patient agreed with the plan and demonstrated an understanding of the instructions.   The patient was advised to call back or seek an in-person evaluation if the symptoms worsen or if the condition fails to improve as anticipated.  I provided 15 minutes of non-face-to-face time during this encounter.   Phillip Rack, NP    Psychiatric Initial Adult Assessment   Patient Identification: Phillip [name] MRN:  [id number] Date of Evaluation:  02/13/2021 Referral Source: Inpatient admission  Chief Complaint:  "I wasn't in a good place." Visit Diagnosis:    ICD-10-CM   1. Bipolar I, most recent episode mixed, severe with psychotic behavior (HCC)  F31.64     2. PTSD (post-traumatic stress disorder)  F43.10       History of Present Illness: Phillip [name] 50 year old Caucasian male presents with a charted history with posttraumatic stress disorder, anxiety, bipolar disorder, attention deficit disorder and schizoaffective disorder.  Reports he was recently discharged from inpatient admission due to a suicide attempt.  He reports he had plans to overdose on medication.  States he is currently prescribed lithium, Klonopin lorazepam, doxepin, Saphris, Vyvanse and Cloraz- Dipot.  Which he reports his medications have been very helpful.  States he is 100% disabled so he has not worked in a while.  States he  is currently followed by therapist and psychiatrist.  States his mood has improved since his previous inpatient admission.  He denied illicit drug use or substance abuse history.  Currently denying suicidal or homicidal ideation.  Denies auditory or visual hallucinations.  Does report history of tobacco use.  I.e. vapping. Stated that " I have cut back a lot."   Patient validates the information provided and below discharge assessment note.  Patient to start intensive outpatient programming on 02/10/2021.  Per discharge assessment note: "Phillip [name] is a 50 year old male with self-reported past psychiatric diagnoses of major depressive disorder versus bipolar disorder, severe anxiety, ADHD who presented to [facility] ED via EMS on 11/22/2020 after an intentional overdose on prescription medications in a suicide attempt.  (Chart review indicates additional prior diagnoses of borderline personality disorder, PTSD and panic disorder with agoraphobia.)  Patient reportedly spoke to a family member on the telephone and made statements regarding suicide.  After the patient hung up, the family member made a call to 911 requesting a welfare check.  EMS arrived to find patient sleeping in his bed with 2 empty pill bottles nearby and a suicide note that stated "Goodbye I am sorry to you all."  Empty pill bottles at bedside were for clonazepam 1 mg #90, filled 11/14/2020 and doxepin, unknown milligram, #30 tablets, filled 10/26/2020.  Per ED notes O2 sat was 87% on room air and patient was responsive to painful stimuli when EMS arrived.  He had a brief episode of hypotension during transport which resolved after he was given fluids and vital signs stabilized by arrival to the ED.  In the ED patient endorsed symptoms of anxiety, hopelessness, sadness, sleep changes and worthlessness.  UDS in the ED was negative.  Patient was deemed medically stable for inpatient psychiatric admission and was transferred "  Associated  Signs/Symptoms: Depression Symptoms:  depressed mood, feelings of worthlessness/guilt, difficulty concentrating, suicidal thoughts with specific plan, (Hypo) Manic Symptoms:  Distractibility, Elevated Mood, Irritable Mood, Anxiety Symptoms:  Excessive Worry, Psychotic Symptoms:  Hallucinations: None PTSD Symptoms: NA  Past Psychiatric History:   Previous Psychotropic Medications: No   Substance Abuse History in the last 12 months:  No.  Consequences of Substance Abuse: NA  Past Medical History:  Past Medical History:  Diagnosis Date   Anxiety    Bipolar disorder (HCC)    Depression    PTSD (post-traumatic stress disorder)     Past Surgical History:  Procedure Laterality Date   cholesterol     WISDOM TOOTH EXTRACTION      Family Psychiatric History:   Family History:  Family History  Problem Relation Age of Onset   Cancer Mother    Cancer Father    Depression Father     Social History:   Social History   Socioeconomic History   Marital status: Divorced    Spouse name: Not on file   Number of children: Not on file   Years of education: Not on file   Highest education level: Not on file  Occupational History   Not on file  Tobacco Use   Smoking status: Former    Packs/day: 0.60    Years: 19.00    Pack years: 11.40    Types: Cigarettes   Smokeless tobacco: Former   Tobacco comments:    Has cut back to 4 a day and trying to stop  Vaping Use   Vaping Use: Every day  Substance and Sexual Activity   Alcohol use: No   Drug use: No    Types: Marijuana    Comment: denies   Sexual activity: Yes    Partners: Female    Birth control/protection: None  Other Topics Concern   Not on file  Social History Narrative   Not on file   Social Determinants of Health   Financial Resource Strain: Not on file  Food Insecurity: Not on file  Transportation Needs: Not on file  Physical Activity: Not on file  Stress: Not on file  Social Connections: Not on file     Additional Social History:   Allergies:   Allergies  Allergen Reactions   Vicodin [Hydrocodone-Acetaminophen] Nausea And Vomiting   Mellaril [Thioridazine] Swelling    Bilateral leg swelling   Testosterone     Other reaction(s): Anaphylaxis    Metabolic Disorder Labs: Lab Results  Component Value Date   HGBA1C 5.8 (H) 11/23/2020   MPG 120 11/23/2020   MPG 103 06/04/2016   Lab Results  Component Value Date   PROLACTIN 19.5 (H) 06/04/2016   Lab Results  Component Value Date   CHOL 199 11/23/2020   TRIG 255 (H) 11/23/2020   HDL 31 (L) 11/23/2020   CHOLHDL 6.4 11/23/2020   VLDL 51 (H) 11/23/2020   LDLCALC 117 (H) 11/23/2020   LDLCALC 207 (H) 06/04/2016   Lab Results  Component Value Date   TSH 1.312 11/23/2020    Therapeutic Level Labs: Lab Results  Component Value Date   LITHIUM 0.32 (L) 11/29/2020   Lab Results  Component Value Date   CBMZ 6.3 06/18/2014   No results found for: VALPROATE  Current  Medications: Current Outpatient Medications  Medication Sig Dispense Refill   asenapine (SAPHRIS) 5 MG SUBL 24 hr tablet 1 qhs 60 tablet 3   clorazepate (TRANXENE) 3.75 MG tablet 1  tid 42 tablet 3   doxepin (SINEQUAN) 25 MG capsule 1 qhs 30 capsule 4   [START ON 02/18/2021] lisdexamfetamine (VYVANSE) 40 MG capsule Take 1 capsule (40 mg total) by mouth daily. 30 capsule 0   lithium carbonate (LITHOBID) 300 MG CR tablet Take 1 tablet (300 mg total) by mouth every 12 (twelve) hours. 60 tablet 4   Multiple Vitamin (MULTIVITAMIN WITH MINERALS) TABS tablet Take 1 tablet by mouth daily.     nicotine polacrilex (NICORETTE) 2 MG gum Take 1 each (2 mg total) by mouth as needed for smoking cessation. 100 tablet 0   No current facility-administered medications for this visit.    Musculoskeletal: Strength & Muscle Tone: within normal limits Gait & Station: normal Patient leans: N/A  Psychiatric Specialty Exam: Review of Systems  There were no vitals taken for  this visit.There is no height or weight on file to calculate BMI.  General Appearance: Casual  Eye Contact:  Good  Speech:  Clear and Coherent  Volume:  Normal  Mood:  Anxious and Depressed  Affect:  Congruent  Thought Process:  Coherent  Orientation:  Full (Time, Place, and Person)  Thought Content:  Logical  Suicidal Thoughts:  No  Homicidal Thoughts:  No  Memory:  Immediate;   Fair Recent;   Fair  Judgement:  Fair  Insight:  Good  Psychomotor Activity:  Normal  Concentration:  Concentration: Good  Recall:  Good  Fund of Knowledge:Good  Language: Good  Akathisia:  No  Handed:  Right  AIMS (if indicated):  done  Assets:  Communication Skills Desire for Improvement Social Support Talents/Skills  ADL's:  Intact  Cognition: WNL  Sleep:  Good   Screenings: AIMS    Flowsheet Row [facility]) [facility]-Ad[facility] (Discharged) [facility]  AIMS Total Score 0 0 0      AUDIT    Flowsheet Row [facility]) [facility] (Dis[facility] (Discharged) [facility]-Admission (Discharged[facility]Ad[facility] (Discharged) [facility]  Alcohol Use Disorder Identification Test Final Score (AUDIT) 0 1 3 1  0      GAD-7    Flowsheet Row Counselor from 04/26/2020 in [facility] [place]  Total GAD-7 Score 18      PHQ2-9    Flowsheet Row Counselor from 02/09/2021 in [facility] Counselor from 04/26/2020 in  [facility] OUTPAT[facility] THERAPY [place]  PHQ-2 Total Score 2 2  PHQ-9 Total Score 9 18      SBQ-R    Flowsheet Row Counselor from 04/26/2020 in [facility] [place]  SBQ-R Total Score 11.1      Flowsheet Row Counselor from 02/09/2021 in [facility] Counselor from 02/03/2021 in BE[facility]IORAL HEALTH OUTPATIENT THERAPY  [facility]  C-SSRS RISK CATEGORY Error: Question 6 not populated No Risk High Risk  Assessment and Plan:  Patient to start intensive outpatient programming (IOP) Continue medications as directed  Treatment plan was reviewed and agreed upon by NP T. Melvyn Neth and patient Faylene Kurtz need for group services.    Phillip Rack, NP 11/20/20226:23 PM

## 2021-02-14 ENCOUNTER — Ambulatory Visit (HOSPITAL_COMMUNITY): Payer: 59 | Admitting: Psychiatry

## 2021-02-14 ENCOUNTER — Telehealth (HOSPITAL_COMMUNITY): Payer: Self-pay | Admitting: Psychiatry

## 2021-02-14 NOTE — Telephone Encounter (Signed)
D:  Patient didn't attend group again today.  A:  Placed call to pt.  He states he's not wanting to return to group.  "I feel like I'm really not talking about my issues because I'm too shy; I get more out of individual counseling with Kandee Keen." Will discharge pt tomorrow.  Will get pt a f/u appt with [facility] and Dr. Donell Beers.  R:  Pt receptive.

## 2021-02-15 ENCOUNTER — Other Ambulatory Visit: Payer: Self-pay

## 2021-02-15 ENCOUNTER — Other Ambulatory Visit (HOSPITAL_COMMUNITY): Payer: 59

## 2021-02-15 NOTE — Progress Notes (Signed)
Virtual Visit via Video Note  I connected with Phillip [name] on $[email or web address]$ @ at  9:00 AM EST by a video enabled telemedicine application and verified that I am speaking with the correct person using two identifiers.  Location: Patient: at home Provider: at office   I discussed the limitations of evaluation and management by telemedicine and the availability of in person appointments. The patient expressed understanding and agreed to proceed.  I discussed the assessment and treatment plan with the patient. The patient was provided an opportunity to ask questions and all were answered. The patient agreed with the plan and demonstrated an understanding of the instructions.   The patient was advised to call back or seek an in-person evaluation if the symptoms worsen or if the condition fails to improve as anticipated.  I provided 20 minutes of non-face-to-face time during this encounter.   Dellia Nims, M.Ed,CNA   [name], male   DOB: 10-09-1970, 50 y.o.   MRN: [id number] As per pt's therapist most recent note states:   "Phillip [name] is a 50 year old divorced Caucasian male that presented for therapy session today and is diagnosed with Bipolar I, most recent episode mixed, severe with psychotic behavior and PTSD.  Clinician met with [name] for virtual appointment and assessed for safety, sobriety, and medication compliance.  Clinician had to use phone call to hold session with [place] today due to him experiencing technical issues connecting with virtual meeting. Ramie spoke in a manner that was alert, oriented x5, with no evidence or self-report of active SI/HI or A/V H.  Chima reported ongoing compliance with medication and denied alcohol or illicit substance use.  Clinician inquired about Darrek's current emotional ratings, as well as any significant changes in thoughts, feelings, or behaviors since last check-in.  Phillip [name] reported scores of 8/10 for depression, 9/10  for anxiety, 0/10 for anger/irritability, and is experiencing 2-3x panic attacks per day.  Kareem reported that his mood has continued to stay inconsistent day to day, stating "My mood has been up and down. I have had my medications changed recently and I'm using a medication holder to stay consistent".  Dai reported that he did attempt suicide via overdose in August 2022 and was taken to the hospital for stabilization.  Clinician discussed safety planning measures with Phillip [name] as a result of recent attempt, including identifying warning signs that a crisis could be building, healthy distraction strategies to engage in, and reliable supports he can outreach if individual coping methods prove ineffective during crisis.  Phillip [name] participated in safety planning process and was able to identify distraction activities, and agreed to speak with his pastor for support if necessary, call a suicide hotline, or travel to the hospital for assessment should SI return with development of intent and/or plan.  He reported that he also sold all firearms following recent attempt to reinforce safety.  Clinician completed a negative CSSRS screening with [place] as well, reinforcing that he is at no risk of self-harm at this time.  Clinician also encouraged Phillip [name] to reconsider group therapy as an option to increase available support and learn additional coping skills to help with stability.  Intervention was effective, as evidenced by [name] completing safety plan to avoid self-harm, and expressing increased motivation to consider engaging in weekly [place] groups, stating "That gives me hope, and sounds exciting.  If I don't have human contact I'll probably stay depressed".  Clinician encouraged Phillip [name] to be more consistent with therapy, agreed to  pass along Harlie's contact information to [place] case manager and will continue to monitor."    As per pt's therapist most recent note states:   "Phillip [name] is a 50 year old divorced  Caucasian male that presented for therapy session today and is diagnosed with Bipolar I, most recent episode mixed, severe with psychotic behavior and PTSD.  Clinician met with [name] for virtual appointment and assessed for safety, sobriety, and medication compliance.  Clinician had to use phone call to hold session with [name] today due to him experiencing technical issues connecting with virtual meeting. [name] spoke in a manner that was alert, oriented x5, with no evidence or self-report of active SI/HI or A/V H.  [name] reported ongoing compliance with medication and denied alcohol or illicit substance use.  Clinician inquired about Phillip [name]'s current emotional ratings, as well as any significant changes in thoughts, feelings, or behaviors since last check-in.  [name] reported scores of 8/10 for depression, 9/10 for anxiety, 0/10 for anger/irritability, and is experiencing 2-3x panic attacks per day.  Phillip [name] reported that his mood has continued to stay inconsistent day to day, stating "My mood has been up and down. I have had my medications changed recently and I'm using a medication holder to stay consistent".  Phillip [name] reported that he did attempt suicide via overdose in August 2022 and was taken to the hospital for stabilization.  Clinician discussed safety planning measures with Phillip [name] as a result of recent attempt, including identifying warning signs that a crisis could be building, healthy distraction strategies to engage in, and reliable supports he can outreach if individual coping methods prove ineffective during crisis.  Phillip [name] participated in safety planning process and was able to identify distraction activities, and agreed to speak with his pastor for support if necessary, call a suicide hotline, or travel to the hospital for assessment should SI return with development of intent and/or plan.  He reported that he also sold all firearms following recent attempt to reinforce safety.  Clinician completed  a negative CSSRS screening with Phillip [name] as well, reinforcing that he is at no risk of self-harm at this time.  Clinician also encouraged Nthony to reconsider group therapy as an option to increase available support and learn additional coping skills to help with stability.  Intervention was effective, as evidenced by [name] completing safety plan to avoid self-harm, and expressing increased motivation to consider engaging in weekly [place] groups, stating "That gives me hope, and sounds exciting.  If I don't have human contact I'll probably stay depressed".  Clinician encouraged Fredis to be more consistent with therapy, agreed to pass along Egan's contact information to [place] case manager and will continue to monitor."   Pt started in [place] 02-09-21.  Denies any SI/HI or A/V hallucinations.  On a scale of 1-10; pt rates his depression at a 7 and anxiety at a 8.  "I feel like I am doing much better.  I feel like I am ready to return to work."  Pt states that the groups went very well this morning.    Pt only attended two days out of the five.  States he doesn't feel comfortable in the groups.  "I just want to return back to seeing [facility] (therapist)."  Pt denies any SI/HI or A/V hallucinations. A:  D/C patient today.  Follow up with Shade Flood, LCSW on 03-03-21 @ 1pm and Dr. Casimiro Needle on 03-15-21 @ 3pm.  Encouraged support groups.  R:  Patient receptive.    Velva Harman  Carlis Abbott, M.Ed, CNA

## 2021-02-15 NOTE — Patient Instructions (Signed)
D:  Patient is requesting discharge today.  A:  Follow up with Noralee Stain, LCSW on 03-03-21 @ 1pm and Dr. Donell Beers on 03-15-21 @ 3pm.  Encouraged support groups.  R:  Patient receptive.

## 2021-02-16 ENCOUNTER — Ambulatory Visit (HOSPITAL_COMMUNITY): Payer: 59

## 2021-02-20 NOTE — Progress Notes (Signed)
  [facility] Intensive Outpatient Program Discharge Summary  Phillip [name] [id number]  Admission date: 02/10/2021 Discharge date: 02/11/2021  Reason for admission: per admission assessment note: Phillip [name] 50 year old Caucasian male presents with a charted history with posttraumatic stress disorder, anxiety, bipolar disorder, attention deficit disorder and schizoaffective disorder.  Reports he was recently discharged from inpatient admission due to a suicide attempt.  He reports he had plans to overdose on medication.  States he is currently prescribed lithium, Klonopin lorazepam, doxepin, Saphris, Vyvanse and Cloraz- Dipot.   Progress in Program Toward Treatment Goals: Ongoing, patient attended and participated with daily group session with active and engaged participation.  Was reported that group was not helping his symptoms.  He declined to continue with intensive outpatient programming.  Patient to follow-up with DBT therapy and/or ECT therapy as discussed by case management.  No acute safety concerns noted at discharge support encouragement reassurance was provided.  Progress (rationale): Patient to keep follow-up appointments with [facility]  Take all medications as prescribed. Keep all follow-up appointments as scheduled.  Do not consume alcohol or use illegal drugs while on prescription medications. Report any adverse effects from your medications to your primary care provider promptly.  In the event of recurrent symptoms or worsening symptoms, call 911, a crisis hotline, or go to the nearest emergency department for evaluation.    Phillip [name] 02/20/2021

## 2021-02-21 ENCOUNTER — Ambulatory Visit (HOSPITAL_COMMUNITY): Payer: 59

## 2021-02-22 ENCOUNTER — Ambulatory Visit (HOSPITAL_COMMUNITY): Payer: 59

## 2021-02-23 ENCOUNTER — Ambulatory Visit (HOSPITAL_COMMUNITY): Payer: 59

## 2021-02-24 ENCOUNTER — Ambulatory Visit (HOSPITAL_COMMUNITY): Payer: 59

## 2021-02-24 ENCOUNTER — Other Ambulatory Visit (HOSPITAL_COMMUNITY): Payer: Self-pay | Admitting: Psychiatry

## 2021-02-25 ENCOUNTER — Ambulatory Visit (HOSPITAL_COMMUNITY): Payer: 59

## 2021-02-28 ENCOUNTER — Ambulatory Visit (HOSPITAL_COMMUNITY): Payer: 59

## 2021-03-01 ENCOUNTER — Ambulatory Visit (HOSPITAL_COMMUNITY): Payer: 59

## 2021-03-03 ENCOUNTER — Ambulatory Visit (INDEPENDENT_AMBULATORY_CARE_PROVIDER_SITE_OTHER): Payer: 59 | Admitting: Licensed Clinical Social Worker

## 2021-03-03 ENCOUNTER — Other Ambulatory Visit: Payer: Self-pay

## 2021-03-03 DIAGNOSIS — F4310 Post-traumatic stress disorder, unspecified: Secondary | ICD-10-CM

## 2021-03-03 DIAGNOSIS — F3164 Bipolar disorder, current episode mixed, severe, with psychotic features: Secondary | ICD-10-CM

## 2021-03-03 NOTE — Progress Notes (Signed)
Virtual Visit via Telephone Note   I connected with Phillip [name] on 03/03/21 at 1:00pm by telephone and verified that I am speaking with the correct person using two identifiers.   I discussed the limitations, risks, security and privacy concerns of performing an evaluation and management service by telephone and the availability of in person appointments. I also discussed with the patient that there may be a patient responsible charge related to this service. The patient expressed understanding and agreed to proceed.   I discussed the assessment and treatment plan with the patient. The patient was provided an opportunity to ask questions and all were answered. The patient agreed with the plan and demonstrated an understanding of the instructions.   The patient was advised to call back or seek an in-person evaluation if the symptoms worsen or if the condition fails to improve as anticipated.   I provided 1 hour of non-face-to-face time during this encounter.     Phillip Stain, LCSW, LCAS __________________________ THERAPIST PROGRESS NOTE   Session Time: 1:00pm - 2:00pm   Location: Patient: Patient Home Provider: [facility]    Participation Level: Active   Behavioral Response: Alert, anxious mood   Type of Therapy:  Individual Therapy   Treatment Goals addressed: Anxiety, depression, and panic attack management; Medication compliance   Interventions: CBT, communication skills    Summary: Phillip [name] is a 50 year old divorced Caucasian male that presented for therapy session today and is diagnosed with Bipolar I, most recent episode mixed, severe with psychotic behavior and PTSD.    Suicidal/Homicidal: None; without intent or plan    Therapist Response: Clinician spoke with Phillip [name] for therapy session via telephone, as Phillip [name] was experiencing technical difficulties which prevented him from engaging in [employer].  Clinician assessed for safety, sobriety, and medication  compliance.  Phillip [name] spoke in a manner that was alert, oriented x5, with no evidence or self-report of active SI/HI or A/V H.  Phillip [name] reported that he continues taking medication as prescribed and denied alcohol or illicit substance use.  Clinician inquired about Phillip [name]'s emotional ratings today, as well as any significant changes in thoughts, feelings, or behaviors since previous check-in.  Phillip [name] reported scores of 2/10 for depression, 5/10 for anxiety, 0/10 for anger/irritability, and is experiencing 1-2x panic attacks per day.  Phillip [name] reported that his primary struggle at this time has been dealing with issues involving his ex-girlfriend.  He reported that they have begun to talk again each day, and this has had a negative impact upon his mental health, but he doesn't know how to establish a healthier boundary with her and avoid giving in to her constant, unreasonable demands.  Clinician introduced topic of assertive communication today to assist.  Clinician covered various handouts with Phillip [name] to guide discussion on the subject.  These handouts defined assertive communication as a communication style in which a person stands up for their own needs and wants, while also taking into consideration the needs and wants of others, without behaving in a passive or aggressive way.  Traits of assertive communicators were highlighted such as using appropriate speaking volume, maintaining eye contact, using confident language, and avoiding interruption.  Phillip [name] was also provided with tips on how to improve communication, including respecting oneself, expressing thoughts and feelings calmly, and saying "No" when necessary.  Phillip [name] was given a variety of scenarios where he could practice using these tips to respond in a more assertive manner.  Intervention was effective, as evidenced by  Phillip [name] actively engaging in discussion on the subject, and participating in roleplay scenarios to successfully reinforce a more assertive  communication style.  Phillip [name] stated "I'm proud of myself for recognizing that I deserve to be treated better and can stand up for myself if I just make an effort.  We are definitely going to need to keep working on this together".  Clinician will continue to monitor.  Plan: Follow up again in 1 week virtually.    Diagnosis:  Bipolar I, most recent episode mixed, severe with psychotic behavior and PTSD.     Phillip Stain, LCSW, LCAS 03/03/21

## 2021-03-10 ENCOUNTER — Telehealth (HOSPITAL_COMMUNITY): Payer: Self-pay | Admitting: Licensed Clinical Social Worker

## 2021-03-10 ENCOUNTER — Ambulatory Visit (HOSPITAL_COMMUNITY): Payer: 59 | Admitting: Licensed Clinical Social Worker

## 2021-03-10 ENCOUNTER — Other Ambulatory Visit: Payer: Self-pay

## 2021-03-10 NOTE — Telephone Encounter (Signed)
Phillip [name] had a virtual therapy appointment scheduled today at 1pm.  Clinician Iona Beard by phone at 1pm, but received no response, so a voicemail was left reminding him of this appointment.  Clinician then made two more attempts at 1:09pm and 1:15pm, but received no response, so front desk staff was informed of this no-show event.    Noralee Stain, [place], LCAS 03/10/21

## 2021-03-15 ENCOUNTER — Ambulatory Visit (HOSPITAL_BASED_OUTPATIENT_CLINIC_OR_DEPARTMENT_OTHER): Payer: 59 | Admitting: Psychiatry

## 2021-03-15 ENCOUNTER — Other Ambulatory Visit (HOSPITAL_COMMUNITY): Payer: Self-pay | Admitting: Psychiatry

## 2021-03-15 ENCOUNTER — Other Ambulatory Visit: Payer: Self-pay

## 2021-03-15 DIAGNOSIS — F259 Schizoaffective disorder, unspecified: Secondary | ICD-10-CM | POA: Diagnosis not present

## 2021-03-15 MED ORDER — LISDEXAMFETAMINE DIMESYLATE 60 MG PO CAPS: 60.0000 mg | ORAL_CAPSULE | Freq: Every day | ORAL | 0 refills | Status: DC

## 2021-03-15 NOTE — Progress Notes (Signed)
Virtual Visit via T  Today the patient was spoken to by the phone.  He has actually missed his appointment.  Due to some problems with his own phone I was unable to communicate with him.  5 minutes before the end of the session he got through to [place].  We spoke for only about 5 or 10 minutes.  The patient actually is doing quite well.  He is back in school.  He is also reconnecting with a friend.  Today we are going to go ahead and increase his Vyvanse from 40 mg to 60 mg.  He will continue all his other medications and return hopefully to see me in the next 1 to 2 months.  He is stable.  Mood seems to be quite good.  He takes his Tranxene 3.75 mg 3 times daily and does well.  Patient is sleeping and eating well.        Assessment/plan  This patient's first problem is that of a schizoaffective disorder.  He will continue taking Saphris as prescribed.  He also takes lithium 300 mg twice daily he recently got blood work and is getting get some results.  The patient is an adjustment disorder with an anxious mood state and takes Tranxene 3.75 mg 3 times daily.  Patient is really doing quite well.  Today we will go ahead and increase his Vyvanse from 40 mg up to 60 mg.

## 2021-03-17 ENCOUNTER — Ambulatory Visit (INDEPENDENT_AMBULATORY_CARE_PROVIDER_SITE_OTHER): Payer: 59 | Admitting: Licensed Clinical Social Worker

## 2021-03-17 ENCOUNTER — Other Ambulatory Visit: Payer: Self-pay

## 2021-03-17 DIAGNOSIS — F3164 Bipolar disorder, current episode mixed, severe, with psychotic features: Secondary | ICD-10-CM | POA: Diagnosis not present

## 2021-03-17 DIAGNOSIS — F4310 Post-traumatic stress disorder, unspecified: Secondary | ICD-10-CM | POA: Diagnosis not present

## 2021-03-17 NOTE — Progress Notes (Signed)
Virtual Visit via Telephone Note   I connected with Phillip [name] on 03/17/21 at 1:00pm by telephone and verified that I am speaking with the correct person using two identifiers.   I discussed the limitations, risks, security and privacy concerns of performing an evaluation and management service by telephone and the availability of in person appointments. I also discussed with the patient that there may be a patient responsible charge related to this service. The patient expressed understanding and agreed to proceed.   I discussed the assessment and treatment plan with the patient. The patient was provided an opportunity to ask questions and all were answered. The patient agreed with the plan and demonstrated an understanding of the instructions.   The patient was advised to call back or seek an in-person evaluation if the symptoms worsen or if the condition fails to improve as anticipated.   I provided 45 minutes of non-face-to-face time during this encounter.     Phillip Stain, LCSW, LCAS __________________________ THERAPIST PROGRESS NOTE   Session Time: 1:00pm - 1:45pm   Location: Patient: Patient Home Provider: [facility]    Participation Level: Active   Behavioral Response: Alert, anxious mood   Type of Therapy:  Individual Therapy   Treatment Goals addressed: Anxiety, depression, and panic attack management; Medication compliance   Interventions: CBT, mindful breathing meditation    Summary: Phillip [name] is a 50 year old divorced Caucasian male that presented for therapy session today and is diagnosed with Bipolar I, most recent episode mixed, severe with psychotic behavior and PTSD.    Suicidal/Homicidal: None; without intent or plan    Therapist Response: Clinician spoke with Phillip [name] for therapy appointment via telephone, as Phillip [name] continues to experience technical difficulties which prevent him from engaging in [employer].  Clinician assessed for safety,  sobriety, and medication compliance.  Phillip [name] spoke in a manner that was alert, oriented x5, with no evidence or self-report of active SI/HI or A/V H.  Phillip [name] reported ongoing compliance with medication and denied alcohol or illicit substance use.  Clinician inquired about Phillip [name]'s current emotional ratings, as well as any significant changes in thoughts, feelings, or behaviors since last check-in.  Phillip [name] reported scores of 2/10 for depression, 6/10 for anxiety, 0/10 for anger/irritability, and is experiencing 2-3x panic attacks per day.  Phillip [name] reported that a recent struggle was falling ill for roughly 2 weeks, stating "I took a COVID test and it was negative.  Now I just have a low grade fever and a mild headache".  Phillip [name] reported that this was why he was unable to attend our last therapy session.  Clinician encouraged Phillip [name] to closely monitor symptoms and consider visiting the urgent care for assessment and guidance if condition does not continue improving.  Clinician inquired about recent increase in panic attacks, and whether Phillip [name] is aware of any triggers which could be influencing this.  Phillip [name] reported that he is uncertain why these have increased, and he has been attempting to cope with them by distracting himself.  Clinician proposed discussion on relaxation skills today to help manage Phillip [name]'s anxiety and improve coping ability.  Clinician invited him to practice mindful breathing meditation exercise today in session.  Phillip [name] was agreeable to this suggestion, so clinician guided him through process of getting comfortable, achieving relaxing breathing rhythm, and then focusing upon maintaining this for 10 minutes while allowing distressing thoughts and feelings to be acknowledged, but then let go of.  Intervention was effective, as evidenced by Phillip [name]  participating in activity successfully, and reporting that his depression and anxiety levels fell to 1/10 as a result.  Phillip [name] stated "I feel calm and  hopeful.  Its a feeling I haven't had much, like I'm happy for the future". Clinician encouraged Phillip [name] to add this to his self-care routine based upon effectiveness, and will continue to monitor.  Plan: Follow up again in 1 week virtually.    Diagnosis:  Bipolar I, most recent episode mixed, severe with psychotic behavior and PTSD.     Phillip [name], [place], LCAS 03/17/21

## 2021-03-21 ENCOUNTER — Other Ambulatory Visit (HOSPITAL_COMMUNITY): Payer: Self-pay | Admitting: Psychiatry

## 2021-03-22 ENCOUNTER — Other Ambulatory Visit (HOSPITAL_COMMUNITY): Payer: Self-pay | Admitting: Psychiatry

## 2021-03-22 MED ORDER — CLORAZEPATE DIPOTASSIUM 3.75 MG PO TABS: ORAL_TABLET | ORAL | 3 refills | Status: DC

## 2021-04-13 ENCOUNTER — Other Ambulatory Visit (HOSPITAL_COMMUNITY): Payer: Self-pay | Admitting: Psychiatry

## 2021-04-13 MED ORDER — LISDEXAMFETAMINE DIMESYLATE 60 MG PO CAPS: 60.0000 mg | ORAL_CAPSULE | Freq: Every day | ORAL | 0 refills | Status: DC

## 2021-05-03 ENCOUNTER — Ambulatory Visit (HOSPITAL_COMMUNITY): Payer: 59 | Admitting: Psychiatry

## 2021-05-12 ENCOUNTER — Telehealth (HOSPITAL_COMMUNITY): Payer: Self-pay | Admitting: *Deleted

## 2021-05-12 NOTE — Telephone Encounter (Signed)
Pt called requesting refill of the Vyvanse 60 mg. Pt has an upcoming appointment on 05/17/21.

## 2021-05-13 ENCOUNTER — Other Ambulatory Visit (HOSPITAL_COMMUNITY): Payer: Self-pay | Admitting: Psychiatry

## 2021-05-13 MED ORDER — LISDEXAMFETAMINE DIMESYLATE 60 MG PO CAPS: 60.0000 mg | ORAL_CAPSULE | Freq: Every day | ORAL | 0 refills | Status: DC

## 2021-05-17 ENCOUNTER — Ambulatory Visit (HOSPITAL_COMMUNITY): Payer: 59 | Admitting: Psychiatry

## 2021-05-17 ENCOUNTER — Other Ambulatory Visit: Payer: Self-pay

## 2021-06-01 ENCOUNTER — Ambulatory Visit (HOSPITAL_COMMUNITY): Payer: 59 | Admitting: Psychiatry

## 2021-06-14 ENCOUNTER — Other Ambulatory Visit: Payer: Self-pay

## 2021-06-14 ENCOUNTER — Ambulatory Visit (HOSPITAL_BASED_OUTPATIENT_CLINIC_OR_DEPARTMENT_OTHER): Payer: Medicare Other | Admitting: Psychiatry

## 2021-06-14 VITALS — BP 166/91 | HR 71 | Resp 20 | Ht 70.0 in | Wt 218.0 lb

## 2021-06-14 DIAGNOSIS — Z5181 Encounter for therapeutic drug level monitoring: Secondary | ICD-10-CM

## 2021-06-14 DIAGNOSIS — Z79899 Other long term (current) drug therapy: Secondary | ICD-10-CM

## 2021-06-14 DIAGNOSIS — F250 Schizoaffective disorder, bipolar type: Secondary | ICD-10-CM | POA: Diagnosis not present

## 2021-06-14 MED ORDER — LITHIUM CARBONATE ER 300 MG PO TBCR: 300.0000 mg | EXTENDED_RELEASE_TABLET | Freq: Two times a day (BID) | ORAL | 4 refills | Status: DC

## 2021-06-14 MED ORDER — ASENAPINE MALEATE 5 MG SL SUBL: SUBLINGUAL_TABLET | SUBLINGUAL | 3 refills | Status: DC

## 2021-06-14 MED ORDER — DOXEPIN HCL 25 MG PO CAPS: ORAL_CAPSULE | ORAL | 4 refills | Status: DC

## 2021-06-14 MED ORDER — LISDEXAMFETAMINE DIMESYLATE 60 MG PO CAPS: 60.0000 mg | ORAL_CAPSULE | Freq: Every day | ORAL | 0 refills | Status: DC

## 2021-06-14 MED ORDER — CLORAZEPATE DIPOTASSIUM 3.75 MG PO TABS: ORAL_TABLET | ORAL | 3 refills | Status: DC

## 2021-06-14 NOTE — Progress Notes (Signed)
Virtual Visit via T    Today's date is June 14, 2021.  Today the patient is seen in the office.  He is doing very well.  His mood is stable.  He is sleeping and eating very well.  He shows no signs of psychosis.  He is functioning extremely well.  He is in the middle school on line at [employer] trying to get a degree in biblical studies.  He wants to get an associate degree but is really planning to get a bachelor's degree.  The patient does a lot of things.  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  He has a good friend in [place] with him named Delynn Flavin who went to high school with him.  They are close friends.  The patient has been sleeping very well since we added doxepin 25 mg and asked him to take his Saphris at night.  His weight is very good.  He feels very healthy.  He reads a lot.  The patient has really done his jeep vehicle and uses it to drive.  He really likes school.  The patient is very friendly with his 3 neighbors.  This includes his landlord who is 67 years old, another individual who is 47 and another individual who is his age.  He feels safe and comfortable.  He is not suspicious.  He has no hallucinations or delusions.  He takes his medicines just as prescribed.  He is going to start going to school in person in the next few months.   Assessment/plan  This patient's diagnosis is that of bipolar disorder versus schizoaffective disorder.  The patient takes lithium 300 mg twice daily and Saphris as prescribed.  He has insomnia and takes doxepin for that condition.  He has attention deficit disorder and continues taking the higher dose of Vyvanse 60 mg which is very effective for him.  He has an adjustment disorder with an anxious mood state and takes Tranxene 3.75 mg 3 times daily and does very well.  His mood state is good.  He is very stable very.  Return to see me in 3 months.

## 2021-07-11 ENCOUNTER — Telehealth (HOSPITAL_COMMUNITY): Payer: Self-pay | Admitting: *Deleted

## 2021-07-11 NOTE — Telephone Encounter (Signed)
Pt called requesting a refill of Vyvanse 60 mg. Pt has an upcoming appointment on 09/14/21. Please review. ?

## 2021-07-12 ENCOUNTER — Other Ambulatory Visit (HOSPITAL_COMMUNITY): Payer: Self-pay | Admitting: Psychiatry

## 2021-07-12 MED ORDER — LISDEXAMFETAMINE DIMESYLATE 60 MG PO CAPS
60.0000 mg | ORAL_CAPSULE | Freq: Every day | ORAL | 0 refills | Status: DC
Start: 2021-09-08 — End: 2021-11-30

## 2021-07-12 MED ORDER — LISDEXAMFETAMINE DIMESYLATE 60 MG PO CAPS
60.0000 mg | ORAL_CAPSULE | Freq: Every day | ORAL | 0 refills | Status: DC
Start: 2021-08-12 — End: 2021-07-12

## 2021-09-14 ENCOUNTER — Telehealth (HOSPITAL_COMMUNITY): Payer: Medicare Other | Admitting: Psychiatry

## 2021-09-22 ENCOUNTER — Telehealth (HOSPITAL_COMMUNITY): Payer: Self-pay

## 2021-09-22 NOTE — Telephone Encounter (Signed)
Medication management - Telephone message left for patient, after he left one requesting a call back as he was not sure if Dr. Donell Beers had sent in all his needed refills. Informed it appeared by record review all orders had refills that should take him at least into the middle of July, including his last Vyvanse order e-scribed 09/08/21.  Requested patient check with his pharmacy and to call back if any issues and to leave message with what medication he needs if one is reported by his pharmacy.

## 2021-09-23 ENCOUNTER — Telehealth (HOSPITAL_COMMUNITY): Payer: Self-pay

## 2021-09-23 NOTE — Telephone Encounter (Signed)
Medication management- Prior authorization for patient's continued use of Vyvanse 60 mg was submitted online with CoverMyMeds to [employer].  PA pending approval.

## 2021-09-26 NOTE — Telephone Encounter (Signed)
PA for Vyvanse 60 mg DENIED by [facility]. Will appeal.

## 2021-11-02 ENCOUNTER — Ambulatory Visit (HOSPITAL_COMMUNITY): Payer: Medicare Other | Admitting: Psychiatry

## 2021-11-06 ENCOUNTER — Other Ambulatory Visit (HOSPITAL_COMMUNITY): Payer: Self-pay | Admitting: Psychiatry

## 2021-11-10 ENCOUNTER — Other Ambulatory Visit (HOSPITAL_COMMUNITY): Payer: Self-pay | Admitting: *Deleted

## 2021-11-10 MED ORDER — CLORAZEPATE DIPOTASSIUM 3.75 MG PO TABS: ORAL_TABLET | ORAL | 0 refills | Status: DC

## 2021-11-10 MED ORDER — ASENAPINE MALEATE 5 MG SL SUBL: SUBLINGUAL_TABLET | SUBLINGUAL | 0 refills | Status: DC

## 2021-11-10 MED ORDER — DOXEPIN HCL 25 MG PO CAPS: ORAL_CAPSULE | ORAL | 0 refills | Status: DC

## 2021-11-10 MED ORDER — LITHIUM CARBONATE ER 300 MG PO TBCR: 300.0000 mg | EXTENDED_RELEASE_TABLET | Freq: Two times a day (BID) | ORAL | 0 refills | Status: DC

## 2021-11-14 ENCOUNTER — Telehealth (HOSPITAL_COMMUNITY): Payer: Self-pay | Admitting: *Deleted

## 2021-11-14 NOTE — Telephone Encounter (Signed)
Patient Called & stated that the -- LORazepam (ATIVAN) tablet 1 mg    & that the Rx informed that he would need to call his provider to authorize transfer to -- [facility] in [place], [place]  Store [phone]

## 2021-11-21 ENCOUNTER — Other Ambulatory Visit (HOSPITAL_COMMUNITY): Payer: Self-pay | Admitting: *Deleted

## 2021-11-21 MED ORDER — CLORAZEPATE DIPOTASSIUM 3.75 MG PO TABS: 3.7500 mg | ORAL_TABLET | Freq: Two times a day (BID) | ORAL | 0 refills | Status: DC | PRN

## 2021-11-30 ENCOUNTER — Telehealth (HOSPITAL_BASED_OUTPATIENT_CLINIC_OR_DEPARTMENT_OTHER): Payer: Medicare Other | Admitting: Psychiatry

## 2021-11-30 DIAGNOSIS — F250 Schizoaffective disorder, bipolar type: Secondary | ICD-10-CM | POA: Diagnosis not present

## 2021-11-30 MED ORDER — CLORAZEPATE DIPOTASSIUM 3.75 MG PO TABS
3.7500 mg | ORAL_TABLET | Freq: Two times a day (BID) | ORAL | 2 refills | Status: DC | PRN
Start: 2021-11-30 — End: 2022-03-01

## 2021-11-30 MED ORDER — LISDEXAMFETAMINE DIMESYLATE 60 MG PO CAPS: 60.0000 mg | ORAL_CAPSULE | Freq: Every day | ORAL | 0 refills | Status: DC

## 2021-11-30 MED ORDER — DOXEPIN HCL 25 MG PO CAPS: ORAL_CAPSULE | ORAL | 4 refills | Status: DC

## 2021-11-30 MED ORDER — LITHIUM CARBONATE ER 300 MG PO TBCR: 300.0000 mg | EXTENDED_RELEASE_TABLET | Freq: Two times a day (BID) | ORAL | 5 refills | Status: DC

## 2021-11-30 MED ORDER — ASENAPINE MALEATE 5 MG SL SUBL
SUBLINGUAL_TABLET | SUBLINGUAL | 5 refills | Status: DC
Start: 2021-11-30 — End: 2022-03-01

## 2021-11-30 NOTE — Progress Notes (Addendum)
Virtual Visit via T   Today on November 30, 2021 I spoke with the patient by phone.  The patient is in [place] at this time visiting a good friend.  His friend's name is Theme park manager.  The patient today is actually doing quite well.  Because of insurance issues and other reasons probably he stopped all of his medications.  He is now restarted on his medications and is starting to feel better.  He is positive and optimistic.  He denies daily depression.  He is sleeping and eating well and has good energy.  He has no evidence of psychosis.  He drinks no alcohol and uses no drugs.  Patient says that he is now rethinking the idea of living in this cabin because of his isolation.  He wants to be around people.  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  The patient is interested in getting back to working environment.  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  The patient is in a good mood today.  He is planning to return to [place] in a few weeks.  He requested to be seen again in person but also to restart her psychotherapy here with Denyse Amass.  The patient says his only complaint is that he seems to be mildly sedated.  Assessment/plan  At this time the patient's diagnosis is schizoaffective disorder.  He will continue taking Saphris sublingual every night.  He will continue on lithium 300 mg twice daily.  His second problem is insomnia.  He does well with doxepin 25 mg.  He is also been prescribed Tranxene 3.75 mg 3 a day.  Today we asked him to take 1 in the morning and 1 at night and take 1 extra if he needs it.  The patient is agreeable with this.  We told him this might be the likely cause of his oversedation.  His third problem is that of attention deficit disorder.  Will continue taking Vyvanse 60 mg.  Finally the patient is interested in getting back into therapy which is great.  Return to see me in 2-1/2 months.  The patient is not suicidal.  He is functioning extremely well.Patient at his home and seen from  our center   spent  30 minutes

## 2021-12-30 ENCOUNTER — Other Ambulatory Visit (HOSPITAL_COMMUNITY): Payer: Self-pay | Admitting: Psychiatry

## 2022-01-03 ENCOUNTER — Telehealth (HOSPITAL_COMMUNITY): Payer: Self-pay | Admitting: *Deleted

## 2022-01-03 NOTE — Telephone Encounter (Signed)
Pt called requesting a refill of Vyvanse 60 mg. Last e-scribed to [facility] on [address] in [place] on 11/30/21. Pt is no longer in [place] and has returned home. Please review.

## 2022-02-01 ENCOUNTER — Telehealth (HOSPITAL_COMMUNITY): Payer: Self-pay | Admitting: *Deleted

## 2022-02-01 NOTE — Telephone Encounter (Signed)
Patient's insurance only covers generic brand for Saphris--  asenapine asenapine (SAPHRIS) 5 MG SUBL 24 hr tablet

## 2022-02-14 ENCOUNTER — Ambulatory Visit (HOSPITAL_COMMUNITY): Payer: Medicare Other | Admitting: Psychiatry

## 2022-03-01 ENCOUNTER — Ambulatory Visit (HOSPITAL_BASED_OUTPATIENT_CLINIC_OR_DEPARTMENT_OTHER): Payer: Medicare Other | Admitting: Psychiatry

## 2022-03-01 DIAGNOSIS — F3162 Bipolar disorder, current episode mixed, moderate: Secondary | ICD-10-CM | POA: Diagnosis not present

## 2022-03-01 MED ORDER — DOXEPIN HCL 25 MG PO CAPS: ORAL_CAPSULE | ORAL | 4 refills | Status: DC

## 2022-03-01 MED ORDER — LAMOTRIGINE 100 MG PO TABS: 100.0000 mg | ORAL_TABLET | Freq: Every day | ORAL | 2 refills | Status: DC

## 2022-03-01 MED ORDER — LITHIUM CARBONATE ER 300 MG PO TBCR: EXTENDED_RELEASE_TABLET | ORAL | 3 refills | Status: DC

## 2022-03-01 MED ORDER — CLORAZEPATE DIPOTASSIUM 3.75 MG PO TABS: 3.7500 mg | ORAL_TABLET | Freq: Two times a day (BID) | ORAL | 2 refills | Status: DC | PRN

## 2022-03-01 MED ORDER — LAMOTRIGINE 25 MG PO TABS
ORAL_TABLET | ORAL | 2 refills | Status: DC
Start: 2022-03-01 — End: 2023-01-18

## 2022-03-01 MED ORDER — LAMOTRIGINE 25 MG PO TABS: ORAL_TABLET | ORAL | 2 refills | Status: DC

## 2022-03-01 MED ORDER — ASENAPINE MALEATE 5 MG SL SUBL
SUBLINGUAL_TABLET | SUBLINGUAL | 5 refills | Status: DC
Start: 2022-03-01 — End: 2022-06-21

## 2022-03-01 NOTE — Progress Notes (Signed)
Virtual Visit via T        Today's date is March 01, 2022.  The patient is seen in the office today.  Today he is not doing well.  Acknowledges that he has become very isolated.  He describes himself as being up and down all the time he gives an intense description of when he is down he gets suicidal.  Fortunately he has not made any suicide attempt that he is not suicidal at this time.  He is isolating himself he is disconnected from his good friend.  The patient is sleeping and eating fairly well.  But at this time he feels quite depressed.  In a closer review it is evident this patient has a mixed bipolar disorder.  I think there are times where he does have psychosis but that does not seem needing to be present at this time.  Assessment/plan    At this time our plan is as to increase his lithium to taking 300 mg 1 in the morning and 2 at night.  The patient will get a lithium blood level and other blood work in 1 week.  He will continue taking his Saphris but we will increase the dose from 5 mg to 10 mg.  He will continue taking Tranxene 3.75 mg 3 times daily.  We will increase his doxepin to a dose of 50 mg.  Importantly is we will begin him on Lamictal.  Will build up over 4 weeks to a dose of 100 mg.  When he returns we will go ahead up to 200 mg but for now we will go slow.  He will be given a warning about contact contacting us if there is a rash.  So our plan is to try him stabilize his mood with a higher dose of lithium to start Lamictal to get him a better night of sleep with doxepin taking 2 of them.  When he returns in about 4 to 5 weeks we will adjust his lithium if necessary and continue to increase his Lamictal.  He says at some point he will retry to make contact with Corey's psychotherapist here.  Patient is not suicidal at this time.  He does not drink alcohol and uses no drugs.  The patient knows he can contact us if necessary.  I think he has been this depressed before but he  denies being suicidal.

## 2022-03-02 ENCOUNTER — Other Ambulatory Visit (HOSPITAL_COMMUNITY): Payer: Self-pay | Admitting: *Deleted

## 2022-03-02 DIAGNOSIS — Z79899 Other long term (current) drug therapy: Secondary | ICD-10-CM

## 2022-03-02 DIAGNOSIS — Z5181 Encounter for therapeutic drug level monitoring: Secondary | ICD-10-CM

## 2022-03-14 ENCOUNTER — Other Ambulatory Visit (HOSPITAL_COMMUNITY): Payer: Self-pay | Admitting: *Deleted

## 2022-03-14 DIAGNOSIS — Z5181 Encounter for therapeutic drug level monitoring: Secondary | ICD-10-CM

## 2022-04-05 ENCOUNTER — Ambulatory Visit (HOSPITAL_COMMUNITY): Payer: Medicare Other | Admitting: Psychiatry

## 2022-05-23 ENCOUNTER — Ambulatory Visit (HOSPITAL_BASED_OUTPATIENT_CLINIC_OR_DEPARTMENT_OTHER): Payer: 59 | Admitting: Psychiatry

## 2022-05-23 DIAGNOSIS — F250 Schizoaffective disorder, bipolar type: Secondary | ICD-10-CM | POA: Diagnosis not present

## 2022-05-23 NOTE — Progress Notes (Signed)
This is a note on May 23, 2022.  The patient unfortunately has stopped all his medications and is not doing well.  This was a phone visit.  The patient is very apologetic and is willing to get back on his medicines.  In fact he started some of them back within the last week.  Today we clarified that he should start back on the lithium by taking 1 twice a day for 4 days and then take 1 in the morning and 2 at night.  We will wait 1 week get some blood work.  We will start back on his Saphris 5 mg and will take Tranxene 3.75 mg twice daily.  Will get back on doxepin taking 25 mg 2 at night.  We will attempt to get him a spiritual counselor.  The patient is not suicidal.  He is not functioning well at all.  This patient she will be seen again in 1 month.  He is not overtly psychotic.    Assessment/plan at this time the patient was restarted on his lithium as described above.  We will get some blood work in approximately 11 days and be seen again in this office in 3 weeks.  He is having a hard time getting out of his home but he will do his best.  He will get back on his Saphris 5 mg to take Tranxene twice daily.  Will call in some doxepin so he can take 2 at night.

## 2022-06-13 ENCOUNTER — Other Ambulatory Visit (HOSPITAL_COMMUNITY): Payer: Self-pay | Admitting: *Deleted

## 2022-06-13 MED ORDER — DOXEPIN HCL 25 MG PO CAPS: ORAL_CAPSULE | ORAL | 0 refills | Status: DC

## 2022-06-21 ENCOUNTER — Other Ambulatory Visit (HOSPITAL_COMMUNITY): Payer: Self-pay | Admitting: Psychiatry

## 2022-06-21 ENCOUNTER — Ambulatory Visit (HOSPITAL_BASED_OUTPATIENT_CLINIC_OR_DEPARTMENT_OTHER): Payer: 59 | Admitting: Psychiatry

## 2022-06-21 ENCOUNTER — Encounter (HOSPITAL_COMMUNITY): Payer: Self-pay | Admitting: Psychiatry

## 2022-06-21 VITALS — BP 121/84 | HR 86 | Wt 175.0 lb

## 2022-06-21 DIAGNOSIS — F3162 Bipolar disorder, current episode mixed, moderate: Secondary | ICD-10-CM | POA: Diagnosis not present

## 2022-06-21 DIAGNOSIS — F250 Schizoaffective disorder, bipolar type: Secondary | ICD-10-CM

## 2022-06-21 MED ORDER — CLORAZEPATE DIPOTASSIUM 3.75 MG PO TABS: 3.7500 mg | ORAL_TABLET | Freq: Two times a day (BID) | ORAL | 2 refills | Status: DC | PRN

## 2022-06-21 MED ORDER — LITHIUM CARBONATE ER 300 MG PO TBCR: EXTENDED_RELEASE_TABLET | ORAL | 3 refills | Status: AC

## 2022-06-21 MED ORDER — DOXEPIN HCL 25 MG PO CAPS: ORAL_CAPSULE | ORAL | 4 refills | Status: DC

## 2022-06-21 MED ORDER — ASENAPINE MALEATE 5 MG SL SUBL: SUBLINGUAL_TABLET | SUBLINGUAL | 5 refills | Status: AC

## 2022-06-21 MED ORDER — AMPHETAMINE-DEXTROAMPHET ER 15 MG PO CP24: 15.0000 mg | ORAL_CAPSULE | Freq: Every day | ORAL | 0 refills | Status: DC

## 2022-06-21 NOTE — Progress Notes (Signed)
[name], male   DOB: [birth year], 52 y.o.   MRN: [id number] Today this patient was seen in the office.  The patient looks good.  He is neat and appropriately dressed.  He still has a lot of problems of isolation.  He feels uncomfortable in his cabin but feels even more comfortable when he gets out of it.  He drove to see me for the first time in years.  He has a scared feeling.  He cannot identify it well.  He does note that when he takes his medicine he does feel better.  He just restarted back on his lithium 1 in the morning and 2 at night.  He also takes Saphris 5 mg which has always been helpful.  Probably the most helpful thing however have however has been Tranxene 3.75 mg 3 times daily.  He sleeps fairly well when he takes doxepin 25 mg 2 at night.  The patient's insurance company would no longer pay for his Vyvanse so we agreed today that he can switch to Adderall 15 mg twice daily.  The patient has a strange rate of eating but he is maintaining his weight.  He shows no overt evidence of psychosis.  He finds his mind moving too quickly to be comfortable.  It is hard to determine if it is anxiety versus of his psychosis.  Unfortunately the patient was 20 minutes late for a 30-minute visit today.  He will return to see me in just 3 to 4 weeks and he will be sure to be on time and we will reevaluate things.  At that time we will attempt to get another lithium level that he has not had in a while.  Schizoaffective disorder  Assessment/plan at this time the patient will continue taking lithium 1 in the morning and 2 at night, Saphris 5 mg, Tranxene 3.75 mg 3 times daily, doxepin 25 mg 2 at night.  We will start him on Adderall 15 mg twice daily.  The patient clearly has an attention deficit disorder.  At moments when he feels better he is very much devoted to getting back to college.  I think to do this is going to be need to be one of the stimulant.  Further the patient is agreed to be  in therapy.  We will make arrangements for him to be seen hopefully at [facility] family services.

## 2022-06-28 ENCOUNTER — Other Ambulatory Visit (HOSPITAL_COMMUNITY): Payer: Self-pay | Admitting: Psychiatry

## 2022-06-28 MED ORDER — AMPHETAMINE-DEXTROAMPHET ER 15 MG PO CP24: 15.0000 mg | ORAL_CAPSULE | Freq: Every day | ORAL | 0 refills | Status: AC

## 2022-08-29 ENCOUNTER — Ambulatory Visit (HOSPITAL_COMMUNITY): Payer: 59 | Admitting: Psychiatry

## 2022-09-13 ENCOUNTER — Other Ambulatory Visit (HOSPITAL_COMMUNITY): Payer: Self-pay | Admitting: Psychiatry

## 2022-09-19 ENCOUNTER — Other Ambulatory Visit (HOSPITAL_COMMUNITY): Payer: Self-pay | Admitting: Psychiatry

## 2022-09-19 MED ORDER — DOXEPIN HCL 25 MG PO CAPS: ORAL_CAPSULE | ORAL | 4 refills | Status: AC

## 2022-10-23 ENCOUNTER — Other Ambulatory Visit (HOSPITAL_COMMUNITY): Payer: Self-pay | Admitting: Psychiatry

## 2022-11-01 ENCOUNTER — Other Ambulatory Visit (HOSPITAL_COMMUNITY): Payer: Self-pay | Admitting: Psychiatry

## 2022-11-08 ENCOUNTER — Ambulatory Visit (HOSPITAL_COMMUNITY): Payer: 59 | Admitting: Psychiatry

## 2022-11-08 ENCOUNTER — Encounter (HOSPITAL_COMMUNITY): Payer: Self-pay

## 2022-11-26 ENCOUNTER — Other Ambulatory Visit: Payer: Self-pay

## 2022-11-26 ENCOUNTER — Encounter (HOSPITAL_BASED_OUTPATIENT_CLINIC_OR_DEPARTMENT_OTHER): Payer: Self-pay | Admitting: Emergency Medicine

## 2022-11-26 ENCOUNTER — Emergency Department (HOSPITAL_BASED_OUTPATIENT_CLINIC_OR_DEPARTMENT_OTHER)
Admission: EM | Admit: 2022-11-26 | Discharge: 2022-11-26 | Discharge status: Against Medical Advice | Payer: 59 | Source: Home / Self Care | Attending: Emergency Medicine | Admitting: Emergency Medicine

## 2022-11-26 DIAGNOSIS — Z5321 Procedure and treatment not carried out due to patient leaving prior to being seen by health care provider: Secondary | ICD-10-CM | POA: Diagnosis not present

## 2022-11-26 DIAGNOSIS — M542 Cervicalgia: Secondary | ICD-10-CM | POA: Diagnosis present

## 2022-11-26 NOTE — ED Notes (Signed)
Pt made no mention of leaving but pt is not in room

## 2022-11-26 NOTE — ED Notes (Signed)
Pt not located in room or immediate area

## 2022-11-26 NOTE — ED Triage Notes (Signed)
Pt c/o neck pain x 31 days

## 2022-11-26 NOTE — ED Notes (Signed)
Called pt's cell phone, unable to reach pt and not able to leave VM

## 2022-11-30 ENCOUNTER — Telehealth (HOSPITAL_COMMUNITY): Payer: Self-pay | Admitting: Psychiatry

## 2022-11-30 NOTE — Telephone Encounter (Signed)
Patient called and stated he recently had a neck injury and would like to speak with Dr. Donell Beers.-Please advise.

## 2022-12-20 ENCOUNTER — Ambulatory Visit (INDEPENDENT_AMBULATORY_CARE_PROVIDER_SITE_OTHER): Payer: Self-pay | Admitting: Clinical

## 2022-12-20 DIAGNOSIS — Z91199 Patient's noncompliance with other medical treatment and regimen due to unspecified reason: Secondary | ICD-10-CM

## 2022-12-20 NOTE — Progress Notes (Signed)
[name], male   DOB: 1971-03-06, 52 y.o.   MRN: [id number]  Therapy Progress Note  Patient had an appointment scheduled with therapist on 12/20/2022  at 9:00am.  When he did not arrive into the virtual session, CSW sent him a text to [phone]  at 9:04am, an email to [email or web address]  at 9:04am, and a second text at 9:10am.  When he did not join the session, CSW called him at [phone] and left a HIPAA-compliant voicemail.  He still did not arrive for the session at 9:25am, so was considered a no show.  This appointment was for a Comprehensive Clinical Assessment to start in therapy.  As per [facility] policy, he will be be charged for this no-show appointment.    Encounter Diagnosis  Name Primary?   No-show for appointment Yes     Ambrose Mantle, LCSW 12/20/2022, 9:22 AM

## 2022-12-22 ENCOUNTER — Other Ambulatory Visit (HOSPITAL_COMMUNITY): Payer: Self-pay | Admitting: Psychiatry

## 2023-01-12 ENCOUNTER — Telehealth: Payer: Self-pay | Admitting: Neurosurgery

## 2023-01-12 NOTE — Telephone Encounter (Signed)
Noted  

## 2023-01-12 NOTE — Telephone Encounter (Signed)
FYI team, he is scheduled for 01/18/2023.   I called and left a message for Dr. Zenaida Niece [facility] office if they have any notes that they can fax over.

## 2023-01-12 NOTE — Telephone Encounter (Signed)
-----   Message from [facility] sent at 01/11/2023  6:22 PM EDT ----- Regarding: RE: Appointment I do not think he has any imaging, but possibly notes from the chiropractor at Dr. Lou Miner office ----- Message ----- From: Rockey Situ Sent: 01/11/2023   1:16 PM EDT To: Venetia Night, MD Subject: RE: Appointment                                He returned my call and I scheduled him for next Thursday. I told him that I would call him back if we could work him in on Tuesday but he said no that next Thursday was fine. Do we need to request images or notes? ----- Message ----- From: Rockey Situ Sent: 01/10/2023  10:24 AM EDT To: Rockey Situ Subject: FW: Appointment                                Left message to call back ----- Message ----- From: Rockey Situ Sent: 01/09/2023   8:26 AM EDT To: Rockey Situ Subject: RE: Appointment                                Referral from chiropractor Left message to call back ----- Message ----- From: Venetia Night, MD Sent: 01/08/2023   5:50 PM EDT To: Rockey Situ Subject: Appointment                                    [name], 04-01-1970, [phone]  Hey- he's apparently got weakness. If he can come in at 1230 or if a new patient slot opens I will see him then. If not offer him Thursday sometime for me. He's got weakness so I'm happy to see him.   Thanks!

## 2023-01-16 NOTE — Progress Notes (Unsigned)
Referring Physician:  No referring provider defined for this encounter.  Primary Physician:  Gwenlyn Found, MD  History of Present Illness: 01/18/2023 Phillip [name] is here today with a chief complaint of left arm weakness.  He woke up 1 morning with a crick in his neck in August 2024.  Since that time, he has had severe pain in his left shoulder blade, arm, and hand.  He has lost sensation and strength in his arm, worst in his thumb and index finger.  He has lost muscle bulk in his left upper arm as well.  He describes difficulty with balance and walking, but has not had any falls.  He cannot hold his neck in a neutral position.  Walking, driving, and sweeping make his pain worse.  The pain can be as bad as 10 out of 10.  Nothing has really helped other than ibuprofen.  Bowel/Bladder Dysfunction: none  Conservative measures: seen a chiropractor Physical therapy: has not participated in   Multimodal medical therapy including regular antiinflammatories: ibuprofen, tylenol  Injections: has not received epidural steroid injections  Past Surgery: no previous spinal surgeries   Phillip [name] has some symptoms of cervical myelopathy.  The symptoms are causing a significant impact on the patient's life.   I have utilized the care everywhere function in epic to review the outside records available from external health systems.  Review of Systems:  A 10 point review of systems is negative, except for the pertinent positives and negatives detailed in the HPI.  Past Medical History: Past Medical History:  Diagnosis Date   Anxiety    Bipolar disorder (HCC)    Depression    PTSD (post-traumatic stress disorder)     Past Surgical History: Past Surgical History:  Procedure Laterality Date   cholesterol     WISDOM TOOTH EXTRACTION      Allergies: Allergies as of 01/18/2023 - Review Complete 01/18/2023  Allergen Reaction Noted   Testosterone  10/23/2014     Medications:  Current Outpatient Medications:    amphetamine-dextroamphetamine (ADDERALL XR) 15 MG 24 hr capsule, Take 1 capsule by mouth daily. 1 qam  1 q noon, Disp: 60 capsule, Rfl: 0   asenapine (SAPHRIS) 5 MG SUBL 24 hr tablet, 2 qhs, Disp: 60 tablet, Rfl: 5   doxepin (SINEQUAN) 25 MG capsule, Take 2 capsules (50 mg total) by mouth at bedtime., Disp: 60 capsule, Rfl: 4   lithium carbonate (LITHOBID) 300 MG ER tablet, 1  qam   2  qhs, Disp: 90 tablet, Rfl: 3   methocarbamol (ROBAXIN) 500 MG tablet, Take 1 tablet (500 mg total) by mouth every 6 (six) hours as needed for muscle spasms., Disp: 120 tablet, Rfl: 0   Multiple Vitamin (MULTIVITAMIN WITH MINERALS) TABS tablet, Take 1 tablet by mouth daily., Disp: , Rfl:   Social History: Social History   Tobacco Use   Smoking status: Former    Current packs/day: 0.60    Average packs/day: 0.6 packs/day for 19.0 years (11.4 ttl pk-yrs)    Types: Cigarettes   Smokeless tobacco: Former   Tobacco comments:    Has cut back to 4 a day and trying to stop  Vaping Use   Vaping status: Every Day  Substance Use Topics   Alcohol use: No   Drug use: No    Types: Marijuana    Comment: denies    Family Medical History: Family History  Problem Relation Age of Onset   Cancer Mother  Cancer Father    Depression Father     Physical Examination: Vitals:   01/18/23 0838  BP: (!) 170/100    General: Patient is in no apparent distress. Attention to examination is appropriate.  Neck:   Supple.  Full range of motion.  Respiratory: Patient is breathing without any difficulty.   NEUROLOGICAL:     Awake, alert, oriented to person, place, and time.  Speech is clear and fluent.   Cranial Nerves: Pupils equal round and reactive to light.  Facial tone is symmetric.  Facial sensation is symmetric. Shoulder shrug is symmetric. Tongue protrusion is midline.  There is no pronator drift.  Strength: Side Biceps Triceps Deltoid Interossei Grip  Wrist Ext. Wrist Flex.  R 5 5 5 5 5 5 5   L 3 3 5  4+ 4+ 5 5   Side Iliopsoas Quads Hamstring PF DF EHL  R 5 5 5 5 5 5   L 5 5 5 5 5 5    Reflexes are 3+ and symmetric at the biceps, triceps, brachioradialis, patella and achilles.   Hoffman's is absent.   Bilateral upper and lower extremity sensation is intact to light touch except left C6 distribution which is diminished.    No evidence of dysmetria noted.  Gait is slightly wide-based.     Medical Decision Making  Imaging: No imaging available  I have personally reviewed the images and agree with the above interpretation.  Assessment and Plan: Phillip [name] is a pleasant 52 y.o. [name] with symptoms of cervical myelopathy and cervical radiculopathy with profound weakness of his left upper extremity.  Based on his symptoms, I feel that he likely has left C6 and possible C7 involvement.  He has substantial weakness.  I have recommended an MRI scan urgently.  We will make additional plans once his MRI is performed.    Thank you for involving me in the care of this patient.      Phillip Hourihan K. Myer Haff MD, [facility] Neurosurgery

## 2023-01-18 ENCOUNTER — Ambulatory Visit: Payer: 59 | Admitting: Neurosurgery

## 2023-01-18 ENCOUNTER — Encounter: Payer: Self-pay | Admitting: Neurosurgery

## 2023-01-18 ENCOUNTER — Other Ambulatory Visit: Payer: 59

## 2023-01-18 VITALS — BP 170/100 | Ht 70.0 in | Wt 177.0 lb

## 2023-01-18 DIAGNOSIS — M5412 Radiculopathy, cervical region: Secondary | ICD-10-CM | POA: Diagnosis not present

## 2023-01-18 DIAGNOSIS — G959 Disease of spinal cord, unspecified: Secondary | ICD-10-CM | POA: Diagnosis not present

## 2023-01-18 DIAGNOSIS — R29898 Other symptoms and signs involving the musculoskeletal system: Secondary | ICD-10-CM | POA: Diagnosis not present

## 2023-01-18 MED ORDER — METHOCARBAMOL 500 MG PO TABS: 500.0000 mg | ORAL_TABLET | Freq: Four times a day (QID) | ORAL | 0 refills | Status: AC | PRN

## 2023-01-18 NOTE — Addendum Note (Signed)
Addended by: Ernie Hew on: 01/18/2023 09:02 AM   Modules accepted: Orders

## 2023-01-18 NOTE — Addendum Note (Signed)
Addended by: Ernie Hew on: 01/18/2023 09:01 AM   Modules accepted: Orders

## 2023-01-19 ENCOUNTER — Ambulatory Visit
Admission: RE | Admit: 2023-01-19 | Discharge: 2023-01-19 | Disposition: A | Discharge status: Home / Self Care | Payer: 59 | Source: Ambulatory Visit | Attending: Neurosurgery | Admitting: Neurosurgery

## 2023-01-19 DIAGNOSIS — R29898 Other symptoms and signs involving the musculoskeletal system: Secondary | ICD-10-CM | POA: Insufficient documentation

## 2023-01-19 DIAGNOSIS — M5412 Radiculopathy, cervical region: Secondary | ICD-10-CM | POA: Insufficient documentation

## 2023-01-19 DIAGNOSIS — G959 Disease of spinal cord, unspecified: Secondary | ICD-10-CM | POA: Diagnosis present

## 2023-01-25 ENCOUNTER — Ambulatory Visit (INDEPENDENT_AMBULATORY_CARE_PROVIDER_SITE_OTHER): Payer: 59 | Admitting: Neurosurgery

## 2023-01-25 DIAGNOSIS — R29898 Other symptoms and signs involving the musculoskeletal system: Secondary | ICD-10-CM

## 2023-01-25 DIAGNOSIS — M5412 Radiculopathy, cervical region: Secondary | ICD-10-CM

## 2023-01-25 DIAGNOSIS — M4802 Spinal stenosis, cervical region: Secondary | ICD-10-CM

## 2023-01-25 NOTE — Progress Notes (Signed)
Referring Physician:  No referring provider defined for this encounter.  Primary Physician:  Quitman Livings, MD  History of Present Illness: 01/25/2023 Mr. Ivey continues to have symptoms.  He presents today for telephone evaluation to discuss his MRI.  01/18/2023 Mr. Huley Jupin is here today with a chief complaint of left arm weakness.  He woke up 1 morning with a crick in his neck in August 2024.  Since that time, he has had severe pain in his left shoulder blade, arm, and hand.  He has lost sensation and strength in his arm, worst in his thumb and index finger.  He has lost muscle bulk in his left upper arm as well.  He describes difficulty with balance and walking, but has not had any falls.  He cannot hold his neck in a neutral position.  Walking, driving, and sweeping make his pain worse.  The pain can be as bad as 10 out of 10.  Nothing has really helped other than ibuprofen.  Bowel/Bladder Dysfunction: none  Conservative measures: seen a chiropractor Physical therapy: has not participated in   Multimodal medical therapy including regular antiinflammatories: ibuprofen, tylenol  Injections: has not received epidural steroid injections  Past Surgery: no previous spinal surgeries   Jorryn Headlee has some symptoms of cervical myelopathy.  The symptoms are causing a significant impact on the patient's life.   I have utilized the care everywhere function in epic to review the outside records available from external health systems.  Review of Systems:  A 10 point review of systems is negative, except for the pertinent positives and negatives detailed in the HPI.  Past Medical History: Past Medical History:  Diagnosis Date   Anxiety    Bipolar disorder (HCC)    Depression    PTSD (post-traumatic stress disorder)     Past Surgical History: Past Surgical History:  Procedure Laterality Date   cholesterol     WISDOM TOOTH EXTRACTION       Allergies: Allergies as of 01/25/2023 - Review Complete 01/18/2023  Allergen Reaction Noted   Testosterone  10/23/2014    Medications:  Current Outpatient Medications:    amphetamine-dextroamphetamine (ADDERALL XR) 15 MG 24 hr capsule, Take 1 capsule by mouth daily. 1 qam  1 q noon, Disp: 60 capsule, Rfl: 0   asenapine (SAPHRIS) 5 MG SUBL 24 hr tablet, 2 qhs, Disp: 60 tablet, Rfl: 5   doxepin (SINEQUAN) 25 MG capsule, Take 2 capsules (50 mg total) by mouth at bedtime., Disp: 60 capsule, Rfl: 4   lithium carbonate (LITHOBID) 300 MG ER tablet, 1  qam   2  qhs, Disp: 90 tablet, Rfl: 3   methocarbamol (ROBAXIN) 500 MG tablet, Take 1 tablet (500 mg total) by mouth every 6 (six) hours as needed for muscle spasms., Disp: 120 tablet, Rfl: 0   Multiple Vitamin (MULTIVITAMIN WITH MINERALS) TABS tablet, Take 1 tablet by mouth daily., Disp: , Rfl:   Social History: Social History   Tobacco Use   Smoking status: Former    Current packs/day: 0.60    Average packs/day: 0.6 packs/day for 19.0 years (11.4 ttl pk-yrs)    Types: Cigarettes   Smokeless tobacco: Former   Tobacco comments:    Has cut back to 4 a day and trying to stop  Vaping Use   Vaping status: Every Day  Substance Use Topics   Alcohol use: No   Drug use: No    Types: Marijuana    Comment: denies  Family Medical History: Family History  Problem Relation Age of Onset   Cancer Mother    Cancer Father    Depression Father     Physical Examination: Telephone visit today     Medical Decision Making  Imaging: MRI C spine 01/19/2023  IMPRESSION: 1. C6-C7 left subarticular and foraminal disc protrusion contacts the exiting left C7 nerve roots. Severe left and moderate right neural foraminal narrowing at this level. 2. C5-C6 moderate to severe bilateral neural foraminal narrowing. 3. C4-C5 moderate to severe left and mild right neural foraminal narrowing. 4. C3-C4 moderate right and mild left neural foraminal  narrowing. 5. C7-T1 mild left and mild-to-moderate right neural foraminal narrowing.     Electronically Signed   By: Wiliam Ke M.D.   On: 01/19/2023 20:04  I have personally reviewed the images and agree with the above interpretation.  Assessment and Plan: Mr. Meloche is a pleasant 52 y.o. male with symptoms of cervical radiculopathy with profound weakness of his left upper extremity.   Based on his symptoms, I feel that he likely has left C6 and possible C7 involvement.  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  He has substantial weakness.  Conservative management is inappropriate given the level of his weakness.  I recommended surgical intervention with C4-7 anterior cervical discectomy and fusion.    I discussed the planned procedure at length with the patient, including the risks, benefits, alternatives, and indications. The risks discussed include but are not limited to bleeding, infection, need for reoperation, spinal fluid leak, stroke, vision loss, anesthetic complication, coma, paralysis, and even death. We also discussed the possibility of post-operative dysphagia, vocal cord paralysis, and the risk of adjacent segment disease in the future. I also described in detail that improvement was not guaranteed.  The patient expressed understanding of these risks, and asked that we proceed with surgery. I described the surgery in layman's terms, and gave ample opportunity for questions, which were answered to the best of my ability.  This visit was performed via telephone.  Patient location: home Provider location: office  I spent a total of 10 minutes non-face-to-face activities for this visit on the date of this encounter including review of current clinical condition and response to treatment.  The patient is aware of and accepts the limits of this  telehealth visit.    Thank you for involving me in the care of this patient.      Lycan Davee K. Myer Haff MD, [facility] Neurosurgery

## 2023-01-26 ENCOUNTER — Telehealth: Payer: Self-pay

## 2023-01-26 DIAGNOSIS — M4802 Spinal stenosis, cervical region: Secondary | ICD-10-CM

## 2023-01-26 DIAGNOSIS — M5412 Radiculopathy, cervical region: Secondary | ICD-10-CM

## 2023-01-26 DIAGNOSIS — R29898 Other symptoms and signs involving the musculoskeletal system: Secondary | ICD-10-CM

## 2023-01-26 NOTE — Telephone Encounter (Signed)
Planned surgery: C4-7 anterior cervical discectomy and fusion   Surgery date: 03/05/23 at [facility]: [address] [place], [place] [address]) - you will find out your arrival time the business day before your surgery.   Pre-op appointment at [facility] Pre-admit Testing: we will call you with a date/time for this. If you are scheduled for an in person appointment, Pre-admit Testing is located on the first floor of the [facility] building, [address], [address]. Please bring all prescriptions in the original prescription bottles to your appointment. During this appointment, they will advise you which medications you can take the morning of surgery, and which medications you will need to hold for surgery. Labs (such as blood work, EKG) may be done at your pre-op appointment. You are not required to fast for these labs. Should you need to change your pre-op appointment, please call Pre-admit testing at [phone].     Surgical clearance: we will send a clearance form to Dr Roseanne Reno. They may wish to see you in their office prior to signing the clearance form. If so, they may call you to schedule an appointment.     NSAIDS (Non-steroidal anti-inflammatory drugs): because you are having a fusion, please avoid taking any NSAIDS (examples: ibuprofen, motrin, aleve, naproxen, meloxicam, diclofenac) for 3 months after surgery. Celebrex is an exception and is OK to take, if prescribed. Tylenol is not an NSAID.    Common restrictions after surgery: No bending, lifting, or twisting ("BLT"). Avoid lifting objects heavier than 10 pounds for the first 6 weeks after surgery. Where possible, avoid household activities that involve lifting, bending, reaching, pushing, or pulling such as laundry, vacuuming, grocery shopping, and childcare. Try to arrange for help from friends and family for these activities while you heal. Do not drive while taking  prescription pain medication. Weeks 6 through 12 after surgery: avoid lifting more than 25 pounds.    X-rays after surgery: Because you are having a fusion: for appointments after your 2 week follow-up: please arrive at the [facility] outpatient imaging center ([address], [address], [place]) or [facility] one hour prior to your appointment for x-rays. This applies to every appointment after your 2 week follow-up. Failure to do so may result in your appointment being rescheduled.   How to contact us:  If you have any questions/concerns before or after surgery, you can reach [place] at [phone], or you can send a mychart message. We can be reached by phone or mychart 8am-4pm, Monday-Friday.  *Please note: Calls after 4pm are forwarded to a third party answering service. Mychart messages are not routinely monitored during evenings, weekends, and holidays. Please call our office to contact the answering service for urgent concerns during non-business hours.   If you have FMLA/disability paperwork, please drop it off or fax it to [phone], attention Patty.   Appointments/FMLA & disability paperwork: Joycelyn Rua, & Flonnie Hailstone Registered Nurse/Surgery scheduler: Royston Cowper Medical Assistants: Nash Mantis Physician Assistants: Manning Charity, PA-C & [name] Leach, PA-C Surgeons: Venetia Night, MD & Ernestine Mcmurray, MD

## 2023-01-30 ENCOUNTER — Telehealth: Payer: 59 | Admitting: Neurosurgery

## 2023-01-30 NOTE — Telephone Encounter (Addendum)
02/26/23 is no longer available, neither is 02/28/23. Available options are now 12/9, 12/11

## 2023-01-30 NOTE — Telephone Encounter (Signed)
Left message to return call.  *Needs post op appts once he chooses date.

## 2023-01-30 NOTE — Telephone Encounter (Signed)
Patient has called to speak about scheduling surgery. Would like to have surgery on 12.2.24- please give patient a call back.

## 2023-02-01 ENCOUNTER — Other Ambulatory Visit: Payer: Self-pay

## 2023-02-01 DIAGNOSIS — Z01818 Encounter for other preprocedural examination: Secondary | ICD-10-CM

## 2023-02-20 ENCOUNTER — Inpatient Hospital Stay
Admission: RE | Admit: 2023-02-20 | Discharge: 2023-02-20 | Disposition: A | Discharge status: Home / Self Care | Payer: 59 | Source: Ambulatory Visit

## 2023-02-20 DIAGNOSIS — Z01812 Encounter for preprocedural laboratory examination: Secondary | ICD-10-CM

## 2023-02-20 HISTORY — DX: Spinal stenosis, cervical region: M48.02

## 2023-02-20 HISTORY — DX: Other cervical disc displacement, unspecified cervical region: M50.20

## 2023-02-20 NOTE — Patient Instructions (Addendum)
Your procedure is scheduled on: 03/05/23 - Monday Report to the Registration Desk on the 1st floor of [facility]. To find out your arrival time, please call [phone] between 1PM - 3PM on: 03/02/23 - Friday If your arrival time is 6:00 am, do not arrive before that time as the [facility] entrance doors do not open until 6:00 am.  REMEMBER: Instructions that are not followed completely may result in serious medical risk, up to and including death; or upon the discretion of your surgeon and anesthesiologist your surgery may need to be rescheduled.  Do not eat food after midnight the night before surgery.  No gum chewing or hard candies.  You may however, drink CLEAR liquids up to 2 hours before you are scheduled to arrive for your surgery. Do not drink anything within 2 hours of your scheduled arrival time.  Clear liquids include: - water  - apple juice without pulp - gatorade (not RED colors) - black coffee or tea (Do NOT add milk or creamers to the coffee or tea) Do NOT drink anything that is not on this list.  NSAIDS (Non-steroidal anti-inflammatory drugs): because you are having a fusion, please avoid taking any NSAIDS (examples: ibuprofen, motrin, aleve, naproxen, meloxicam, diclofenac) for 3 months after surgery. Celebrex is an exception and is OK to take, if prescribed. Tylenol is not an NSAID.  Stop ANY OVER THE COUNTER supplements until after surgery : Multivitamin  Continue taking all of your other prescription medications up until the day of surgery.  ON THE DAY OF SURGERY ONLY TAKE THESE MEDICATIONS WITH SIPS OF WATER:  ADDERALL XR  lithium carbonate (LITHOBID)    No Alcohol for 24 hours before or after surgery.  No Smoking including e-cigarettes for 24 hours before surgery.  No chewable tobacco products for at least 6 hours before surgery.  No nicotine patches on the day of surgery.  Do not use any "recreational" drugs for at least a week (preferably 2  weeks) before your surgery.  Please be advised that the combination of cocaine and anesthesia may have negative outcomes, up to and including death. If you test positive for cocaine, your surgery will be cancelled.  On the morning of surgery brush your teeth with toothpaste and water, you may rinse your mouth with mouthwash if you wish. Do not swallow any toothpaste or mouthwash.  Use CHG Soap or wipes as directed on instruction sheet.  Do not wear jewelry, make-up, hairpins, clips or nail polish.  For welded (permanent) jewelry: bracelets, anklets, waist bands, etc.  Please have this removed prior to surgery.  If it is not removed, there is a chance that hospital personnel will need to cut it off on the day of surgery.  Do not wear lotions, powders, or perfumes.   Do not shave body hair from the neck down 48 hours before surgery.  Contact lenses, hearing aids and dentures may not be worn into surgery.  Do not bring valuables to the hospital. [facility] is not responsible for any missing/lost belongings or valuables.   Notify your doctor if there is any change in your medical condition (cold, fever, infection).  Wear comfortable clothing (specific to your surgery type) to the hospital.  After surgery, you can help prevent lung complications by doing breathing exercises.  Take deep breaths and cough every 1-2 hours. Your doctor may order a device called an Incentive Spirometer to help you take deep breaths. When coughing or sneezing, hold a pillow  firmly against your incision with both hands. This is called "splinting." Doing this helps protect your incision. It also decreases belly discomfort.  If you are being admitted to the hospital overnight, leave your suitcase in the car. After surgery it may be brought to your room.  In case of increased patient census, it may be necessary for you, the patient, to continue your postoperative care in the Same Day Surgery department.  If you  are being discharged the day of surgery, you will not be allowed to drive home. You will need a responsible individual to drive you home and stay with you for 24 hours after surgery.   If you are taking public transportation, you will need to have a responsible individual with you.  Please call the Pre-admissions Testing Dept. at [phone] if you have any questions about these instructions.  Surgery Visitation Policy:  Patients having surgery or a procedure may have two visitors.  Children under the age of 34 must have an adult with them who is not the patient.  Inpatient Visitation:    Visiting hours are 7 a.m. to 8 p.m. Up to four visitors are allowed at one time in a patient room. The visitors may rotate out with other people during the day.  One visitor age 35 or older may stay with the patient overnight and must be in the room by 8 p.m.     Pre-operative 5 CHG Bath Instructions   You can play a key role in reducing the risk of infection after surgery. Your skin needs to be as free of germs as possible. You can reduce the number of germs on your skin by washing with CHG (chlorhexidine gluconate) soap before surgery. CHG is an antiseptic soap that kills germs and continues to kill germs even after washing.   DO NOT use if you have an allergy to chlorhexidine/CHG or antibacterial soaps. If your skin becomes reddened or irritated, stop using the CHG and notify one of our RNs at [phone].   Please shower with the CHG soap starting 4 days before surgery using the following schedule: 12/05 - 12/09.            Please keep in mind the following:  DO NOT shave, including legs and underarms, starting the day of your first shower.   You may shave your face at any point before/day of surgery.  Place clean sheets on your bed the day you start using CHG soap. Use a clean washcloth (not used since being washed) for each shower. DO NOT sleep with pets once you start using the CHG.    CHG Shower Instructions:  If you choose to wash your hair and private area, wash first with your normal shampoo/soap.  After you use shampoo/soap, rinse your hair and body thoroughly to remove shampoo/soap residue.  Turn the water OFF and apply about 3 tablespoons (45 ml) of CHG soap to a CLEAN washcloth.  Apply CHG soap ONLY FROM YOUR NECK DOWN TO YOUR TOES (washing for 3-5 minutes)  DO NOT use CHG soap on face, private areas, open wounds, or sores.  Pay special attention to the area where your surgery is being performed.  If you are having back surgery, having someone wash your back for you may be helpful. Wait 2 minutes after CHG soap is applied, then you may rinse off the CHG soap.  Pat dry with a clean towel  Put on clean clothes/pajamas   If you choose to wear lotion,  please use ONLY the CHG-compatible lotions on the back of this paper.     Additional instructions for the day of surgery: DO NOT APPLY any lotions, deodorants, cologne, or perfumes.   Put on clean/comfortable clothes.  Brush your teeth.  Ask your nurse before applying any prescription medications to the skin.      CHG Compatible Lotions   Aveeno Moisturizing lotion  Cetaphil Moisturizing Cream  Cetaphil Moisturizing Lotion  Clairol Herbal Essence Moisturizing Lotion, Dry Skin  Clairol Herbal Essence Moisturizing Lotion, Extra Dry Skin  Clairol Herbal Essence Moisturizing Lotion, Normal Skin  Curel Age Defying Therapeutic Moisturizing Lotion with Alpha Hydroxy  Curel Extreme Care Body Lotion  Curel Soothing Hands Moisturizing Hand Lotion  Curel Therapeutic Moisturizing Cream, Fragrance-Free  Curel Therapeutic Moisturizing Lotion, Fragrance-Free  Curel Therapeutic Moisturizing Lotion, Original Formula  Eucerin Daily Replenishing Lotion  Eucerin Dry Skin Therapy Plus Alpha Hydroxy Crme  Eucerin Dry Skin Therapy Plus Alpha Hydroxy Lotion  Eucerin Original Crme  Eucerin Original Lotion  Eucerin Plus  Crme Eucerin Plus Lotion  Eucerin TriLipid Replenishing Lotion  Keri Anti-Bacterial Hand Lotion  Keri Deep Conditioning Original Lotion Dry Skin Formula Softly Scented  Keri Deep Conditioning Original Lotion, Fragrance Free Sensitive Skin Formula  Keri Lotion Fast Absorbing Fragrance Free Sensitive Skin Formula  Keri Lotion Fast Absorbing Softly Scented Dry Skin Formula  Keri Original Lotion  Keri Skin Renewal Lotion Keri Silky Smooth Lotion  Keri Silky Smooth Sensitive Skin Lotion  Nivea Body Creamy Conditioning Oil  Nivea Body Extra Enriched Teacher, adult education Moisturizing Lotion Nivea Crme  Nivea Skin Firming Lotion  NutraDerm 30 Skin Lotion  NutraDerm Skin Lotion  NutraDerm Therapeutic Skin Cream  NutraDerm Therapeutic Skin Lotion  ProShield Protective Hand Cream  Provon moisturizing lotion

## 2023-02-21 NOTE — Telephone Encounter (Signed)
Left message on pt's voicemail yesterday to contact pre-admit testing to reschedule his missed appointment. Sent mychart message yesterday. Attempted to reach patient again today, but call goes straight to voicemail.

## 2023-02-26 ENCOUNTER — Encounter: Payer: Self-pay | Admitting: Neurosurgery

## 2023-02-26 NOTE — Telephone Encounter (Signed)
Left message on pt's emergency contact's voicemail

## 2023-02-27 ENCOUNTER — Telehealth: Payer: Self-pay

## 2023-02-27 ENCOUNTER — Telehealth: Payer: Self-pay | Admitting: Neurosurgery

## 2023-02-27 NOTE — Telephone Encounter (Signed)
Called - no answer. Straight to VM.

## 2023-02-27 NOTE — Telephone Encounter (Signed)
Phillip [name] is scheduled for surgery on 03/05/23. He did not show up for pre-admit testing appointment on 02/20/23. He has not scheduled PCP clearance appointment (we spoke with him about this on 02/05/23 and he said he would call to schedule this). Pre-admit testing and our office have attempted to reach the patient multiple times over the past week. I have left voicemails, sent mychart messages, and left a voicemail for his emergency contact. At this point, Dr Myer Haff has decided to cancel his surgery due to no response from the patient.

## 2023-03-05 ENCOUNTER — Encounter: Admission: RE | Payer: Self-pay | Source: Home / Self Care

## 2023-03-05 ENCOUNTER — Inpatient Hospital Stay: Admission: RE | Admit: 2023-03-05 | Payer: 59 | Source: Home / Self Care | Admitting: Neurosurgery

## 2023-03-05 SURGERY — ANTERIOR CERVICAL DECOMPRESSION/DISCECTOMY FUSION 3 LEVELS
Anesthesia: General

## 2023-03-15 ENCOUNTER — Encounter: Payer: 59 | Admitting: Neurosurgery

## 2023-04-17 ENCOUNTER — Encounter: Payer: 59 | Admitting: Neurosurgery

## 2023-05-24 ENCOUNTER — Encounter: Payer: 59 | Admitting: Neurosurgery
